# Patient Record
Sex: Female | Born: 1935 | Race: White | Hispanic: No | Marital: Married | Smoking: Never smoker
Health system: Southern US, Community
[De-identification: ages and names within clinical notes are randomized; demographics above are authoritative.]

## PROBLEM LIST (undated history)

## (undated) DIAGNOSIS — R413 Other amnesia: Secondary | ICD-10-CM

## (undated) DIAGNOSIS — E079 Disorder of thyroid, unspecified: Secondary | ICD-10-CM

## (undated) DIAGNOSIS — E785 Hyperlipidemia, unspecified: Secondary | ICD-10-CM

## (undated) DIAGNOSIS — Z9109 Other allergy status, other than to drugs and biological substances: Secondary | ICD-10-CM

## (undated) DIAGNOSIS — M1990 Unspecified osteoarthritis, unspecified site: Secondary | ICD-10-CM

## (undated) DIAGNOSIS — F329 Major depressive disorder, single episode, unspecified: Secondary | ICD-10-CM

## (undated) DIAGNOSIS — F32A Depression, unspecified: Secondary | ICD-10-CM

## (undated) DIAGNOSIS — K635 Polyp of colon: Secondary | ICD-10-CM

## (undated) DIAGNOSIS — I4891 Unspecified atrial fibrillation: Secondary | ICD-10-CM

## (undated) DIAGNOSIS — I442 Atrioventricular block, complete: Secondary | ICD-10-CM

## (undated) DIAGNOSIS — T7840XA Allergy, unspecified, initial encounter: Secondary | ICD-10-CM

## (undated) HISTORY — DX: Polyp of colon: K63.5

## (undated) HISTORY — DX: Allergy, unspecified, initial encounter: T78.40XA

## (undated) HISTORY — DX: Unspecified osteoarthritis, unspecified site: M19.90

## (undated) HISTORY — PX: HAND SURGERY: SHX662

## (undated) HISTORY — DX: Major depressive disorder, single episode, unspecified: F32.9

## (undated) HISTORY — PX: SALPINGOOPHORECTOMY: SHX82

## (undated) HISTORY — DX: Depression, unspecified: F32.A

## (undated) HISTORY — PX: EYE SURGERY: SHX253

## (undated) HISTORY — DX: Other amnesia: R41.3

## (undated) HISTORY — PX: KNEE SURGERY: SHX244

---

## 1963-06-23 HISTORY — PX: APPENDECTOMY: SHX54

## 1963-06-23 HISTORY — PX: OTHER SURGICAL HISTORY: SHX169

## 1983-06-23 HISTORY — PX: OTHER SURGICAL HISTORY: SHX169

## 1994-06-22 HISTORY — PX: CERVICAL POLYPECTOMY: SHX88

## 2000-04-28 ENCOUNTER — Encounter: Payer: Self-pay | Admitting: Obstetrics and Gynecology

## 2000-04-28 ENCOUNTER — Encounter: Admission: RE | Admit: 2000-04-28 | Discharge: 2000-04-28 | Payer: Self-pay | Admitting: Obstetrics and Gynecology

## 2000-05-04 ENCOUNTER — Other Ambulatory Visit: Admission: RE | Admit: 2000-05-04 | Discharge: 2000-05-04 | Payer: Self-pay | Admitting: Obstetrics and Gynecology

## 2000-05-10 ENCOUNTER — Encounter: Payer: Self-pay | Admitting: Obstetrics and Gynecology

## 2000-05-10 ENCOUNTER — Encounter: Admission: RE | Admit: 2000-05-10 | Discharge: 2000-05-10 | Payer: Self-pay | Admitting: Obstetrics and Gynecology

## 2001-05-14 ENCOUNTER — Emergency Department (HOSPITAL_COMMUNITY): Admission: EM | Admit: 2001-05-14 | Discharge: 2001-05-14 | Payer: Self-pay | Admitting: Emergency Medicine

## 2001-05-14 ENCOUNTER — Encounter: Payer: Self-pay | Admitting: Cardiology

## 2002-05-29 ENCOUNTER — Other Ambulatory Visit: Admission: RE | Admit: 2002-05-29 | Discharge: 2002-05-29 | Payer: Self-pay | Admitting: Obstetrics and Gynecology

## 2003-10-08 ENCOUNTER — Other Ambulatory Visit: Admission: RE | Admit: 2003-10-08 | Discharge: 2003-10-08 | Payer: Self-pay | Admitting: Obstetrics and Gynecology

## 2004-02-19 ENCOUNTER — Ambulatory Visit (HOSPITAL_COMMUNITY): Admission: RE | Admit: 2004-02-19 | Discharge: 2004-02-19 | Payer: Self-pay | Admitting: Obstetrics and Gynecology

## 2004-03-12 ENCOUNTER — Ambulatory Visit: Admission: RE | Admit: 2004-03-12 | Discharge: 2004-03-12 | Payer: Self-pay | Admitting: Gynecologic Oncology

## 2004-12-10 ENCOUNTER — Ambulatory Visit (HOSPITAL_COMMUNITY): Admission: RE | Admit: 2004-12-10 | Discharge: 2004-12-10 | Payer: Self-pay | Admitting: Endocrinology

## 2005-06-22 HISTORY — PX: COLONOSCOPY: SHX174

## 2005-09-08 ENCOUNTER — Ambulatory Visit (HOSPITAL_COMMUNITY): Admission: RE | Admit: 2005-09-08 | Discharge: 2005-09-08 | Payer: Self-pay | Admitting: Obstetrics and Gynecology

## 2005-10-20 ENCOUNTER — Encounter: Admission: RE | Admit: 2005-10-20 | Discharge: 2005-10-20 | Payer: Self-pay | Admitting: Cardiology

## 2006-09-13 ENCOUNTER — Ambulatory Visit (HOSPITAL_COMMUNITY): Admission: RE | Admit: 2006-09-13 | Discharge: 2006-09-13 | Payer: Self-pay | Admitting: Obstetrics and Gynecology

## 2006-11-25 ENCOUNTER — Ambulatory Visit (HOSPITAL_COMMUNITY): Admission: RE | Admit: 2006-11-25 | Discharge: 2006-11-25 | Payer: Self-pay | Admitting: Obstetrics & Gynecology

## 2006-11-25 ENCOUNTER — Encounter (INDEPENDENT_AMBULATORY_CARE_PROVIDER_SITE_OTHER): Payer: Self-pay | Admitting: Obstetrics & Gynecology

## 2009-07-17 ENCOUNTER — Emergency Department (HOSPITAL_COMMUNITY): Admission: EM | Admit: 2009-07-17 | Discharge: 2009-07-18 | Payer: Self-pay | Admitting: Emergency Medicine

## 2009-09-06 ENCOUNTER — Encounter: Admission: RE | Admit: 2009-09-06 | Discharge: 2009-09-06 | Payer: Self-pay | Admitting: Neurology

## 2009-12-26 ENCOUNTER — Ambulatory Visit: Payer: Self-pay | Admitting: Internal Medicine

## 2010-01-03 ENCOUNTER — Ambulatory Visit (HOSPITAL_COMMUNITY): Admission: RE | Admit: 2010-01-03 | Discharge: 2010-01-03 | Payer: Self-pay | Admitting: Internal Medicine

## 2010-07-13 ENCOUNTER — Encounter: Payer: Self-pay | Admitting: Internal Medicine

## 2010-09-08 LAB — COMPREHENSIVE METABOLIC PANEL
ALT: 18 U/L (ref 0–35)
AST: 27 U/L (ref 0–37)
Albumin: 3.6 g/dL (ref 3.5–5.2)
Creatinine, Ser: 0.77 mg/dL (ref 0.4–1.2)
GFR calc non Af Amer: >60 mL/min (ref >60)
Glucose, Bld: 127 mg/dL — ABNORMAL HIGH (ref 70–99)
Potassium: 4.4 mEq/L (ref 3.5–5.1)
Sodium: 135 mEq/L (ref 135–145)

## 2010-09-08 LAB — CBC
Platelets: 203 10*3/uL (ref 150–400)
RBC: 4.01 MIL/uL (ref 3.87–5.11)
RDW: 12.8 % (ref 11.5–15.5)
WBC: 6.1 10*3/uL (ref 4.0–10.5)

## 2010-09-08 LAB — URINE MICROSCOPIC-ADD ON

## 2010-09-08 LAB — URINE CULTURE

## 2010-09-08 LAB — URINALYSIS, ROUTINE W REFLEX MICROSCOPIC
Glucose, UA: NEGATIVE mg/dL
Ketones, ur: NEGATIVE mg/dL
Protein, ur: NEGATIVE mg/dL
pH: 5.5 (ref 5.0–8.0)

## 2010-09-08 LAB — POCT CARDIAC MARKERS: Troponin i, poc: <0.05 ng/mL (ref 0.00–0.09)

## 2010-09-08 LAB — DIFFERENTIAL
Basophils Absolute: 0 10*3/uL (ref 0.0–0.1)
Eosinophils Absolute: 0.1 10*3/uL (ref 0.0–0.7)
Eosinophils Relative: 2 % (ref 0–5)
Lymphs Abs: 1.9 10*3/uL (ref 0.7–4.0)
Monocytes Relative: 11 % (ref 3–12)
Neutro Abs: 3.4 10*3/uL (ref 1.7–7.7)

## 2010-09-08 LAB — PROTIME-INR: Prothrombin Time: 12.6 seconds (ref 11.6–15.2)

## 2010-11-07 NOTE — Consult Note (Signed)
[name], [name]                   ACCOUNT NO.:  [id number]   MEDICAL RECORD NO.:  [id number]          PATIENT TYPE:  OUT   LOCATION:  GYN                          FACILITY:  [facility]   PHYSICIAN:  John T. Kyla Balzarine, M.D.    DATE OF BIRTH:  12-16-1934   DATE OF CONSULTATION:  DATE OF DISCHARGE:                                   CONSULTATION   CHIEF COMPLAINT:  This 76 year old woman is seen at the request of Dr.  Ambrose Mantle regarding management of a simple ovarian cyst.   HISTORY OF PRESENT ILLNESS:  Patient relates occasional right lower quadrant  pain.  She had an ultrasound in April, 2005, which apparently revealed a  simple cyst of the ovary.  CA-125 value was 10 u/ml.  Her right lower  quadrant pain has essentially resolved.  She has a remote history of  endometriosis at age 55, or perhaps a dermoid cyst, as she states teeth were  in the cyst, which was removed from her ovary.  She underwent menopause in  her late 74s and was on estrogen and progesterone for nine years, quitting  10 years ago.  Patient had a follow-up ultrasound scheduled by another  physician on August 30th, which revealed a 3.6 cm simple cyst of the right  ovary.  The uterus and left ovary was normal with a thin endometrial stripe  and no pelvic fluid.   PAST MEDICAL HISTORY:  Significant for postmenopausal mild hirsutism,  childhood asthma, and respiratory allergies.   PAST SURGICAL HISTORY:  Includes ovarian cystectomy for likely dermoid,  although the patient repeatedly states that this was endometriosis.  She is  status post NSVD x1.   MEDICATIONS:  Multivitamins.  Ativan p.r.n.   ALLERGIES:  None known.   FAMILY HISTORY:  Negative for ovarian cancer.   SOCIAL HISTORY:  Denies tobacco use.  Married.  Admits occasional wine.   REVIEW OF SYSTEMS:  GENERAL/SYSTEMIC:  No fever, chills, anorexia, fatigue,  or weight loss.  CARDIOPULMONARY:  Childhood asthma.  No wheezing episodes  or asthmatic treatment since  teens.  RESPIRATORY:  Allergies.  GI:  The  patient has a long history of spastic colon with chronic constipation,  treated with a variety of medications in the past.  She currently regulates  her bowel function with diet.  GU:  No GU symptoms.  OB/GYN:  As above:  NEURO/PSYCH:  Patient has some type of ill-defined anxiety disorder, having  used Prozac in the past and currently on Ativan for a sleep disorder.  She  denies suicidal ideations.   PHYSICAL EXAMINATION:  VITAL SIGNS:  Weight 180 pounds.  Blood pressure  122/78, pulse 72, afebrile.  GENERAL:  Patient is alert and oriented x 3 in no acute distress.  NECK:  There is no pathological adenopathy.  LUNGS:  Lung fields are clear.  BACK:  There is no back or CVA tenderness.  ABDOMEN:  Scaphoid, soft, benign without ascites, mass, tenderness, or  organomegaly.  EXTREMITIES:  Full range of motion without edema.  PELVIC:  External genitalia and BUS are normal  on inspection and palpation.  The bladder and urethra are atrophic and well supported.  The cervix has no  lesions or tenderness to cervical motion.  Bimanual and rectovaginal  examinations reveal normal uterus.  There is no adnexal mass, tenderness, or  nodularity palpable.   ASSESSMENT:  A less than 4 cm simple cyst to the right ovary with no  pathologic features suggesting ovarian cancer and normal CA-125 values (risk  of malignancy less than 1%).   RECOMMENDATIONS:  I had a long discussion with the patient and her husband,  in excess of 30 minutes, and answered multiple questions posed by them  regarding the likelihood of malignancy or this cyst causing a problem.  I  recommended that they should undergo a follow-up ultrasound under the  auspices of Dr. Rosalio Macadamia.  Because the risk of malignancy is so small, I do  not feel there is any pressing need for surgery at this time.  If she has  significant enlargement of the cyst, change in features that would suggest  malignancy  or symptoms (including marked anxiety), surgery could be  considered.  Unless there are features that are at high risk for malignancy,  this patient's surgery could be performed by Dr. Rosalio Macadamia.     John   JTS/MEDQ  D:  03/12/2004  T:  03/13/2004  Job:  [id number]   cc:   Malachi Pro. Ambrose Mantle, M.D.  [address]  [address]  [place] [address]  Fax: [phone]   Cordelia Pen A. Rosalio Macadamia, M.D.  [address]  [place]  [place] [address]  Fax: [phone]   Telford Nab, R.N.  [phone] [address]  [place], [place] [address]

## 2010-11-07 NOTE — Op Note (Signed)
NAMEKatrece, [name]                   ACCOUNT NO.:  [id number]   MEDICAL RECORD NO.:  [id number]          PATIENT TYPE:  AMB   LOCATION:  SDC                           FACILITY:  [facility]   PHYSICIAN:  Genia Del, M.D.DATE OF BIRTH:  24-Sep-1934   DATE OF PROCEDURE:  11/25/2006  DATE OF DISCHARGE:                               OPERATIVE REPORT   PREOPERATIVE DIAGNOSIS:  Right persistent ovarian cyst.   POSTOPERATIVE DIAGNOSIS:  Right persistent ovarian cyst.   PROCEDURE:  Laparoscopy with right salpingo-oophorectomy.   SURGEON:  Dr. Genia Del   ASSISTANT:  Dr. Marina Gravel.   ANESTHESIOLOGIST:  Dr. Tacy Dura   PROCEDURE:  Under general anesthesia with endotracheal intubation the  patient is in lithotomy position.  She is prepped with Betadine on the  abdominal, suprapubic, vulvar and vaginal areas. Foley is put in place  and the patient is draped as usual.  The vaginal exam reveals an  anteverted uterus, normal volume, no mass felt.  The speculum was  introduced in the vagina.  The uterus is cannulated and the speculum is  removed and infiltration of Marcaine one-quarter plain is done at the  infraumbilical area.  We then make an infraumbilical incision over 1.5  cm, opened the aponeurosis with Mayo scissors under direct vision and  opened the parietal peritoneum under direct vision with Mayo scissors.  We put a pursestring stitch of Vicryl 0 at the aponeurosis.  We then  insert the [place] and the camera at that level. Pneumoperitoneum with  CO2 is created.  We make two contralateral incisions at the iliac areas  with the scalpel over 5 mm each. We introduce a 5-mm trocars at each  level under direct vision.  We insert the instruments which are the  gyrus and a clamp.  We inspect the abdominopelvic cavity.  The liver is  normal in appearance.  The appendix is not visualized.  We see a normal  uterus in size and appearance.  The left tube and ovary are normal.  The  right  tube is normal.  The right ovary presents a small thin wall cyst  measuring about 3.5-4 cm.  We visualized the ureter and it is in normal  anatomic position on the right side.  We cauterize and section the right  infundibulopelvic ligament.  We follow just under the ovary all along.  We cauterized and section the proximal right tube and the utero-ovarian  ligament.  We detached the right ovary and tube completely.  We then  switched to a 5 mm scope and we put the bag in the infraumbilical  trocar. We put the right ovary and tube in that bag, removed it and sent  it to pathology.  We then verify hemostasis, it is adequate at all  pedicles. We irrigate and suction the abdominopelvic cavity.  We then  removed all instruments, removed the trocars under direct vision.  We  close the aponeurosis of the infraumbilical incision by attaching the  pursestring stitch.  We then close the skin with subcuticular sutures of  Vicryl 4-0 at all  incisions.  Hemostasis was completed with the  electrocautery at the infraumbilical incision.  We then  irrigated, good hemostasis at all incisions.  The estimated blood loss  was minimal.  No complications occurred. The instrument was removed  vaginally.  The patient received a dose of Flagyl 500 mg IV at  induction.  No complications occurred and the patient was brought to  recovery room in good stable status.      Genia Del, M.D.  Electronically Signed     ML/MEDQ  D:  11/25/2006  T:  11/25/2006  Job:  [id number]

## 2011-01-05 ENCOUNTER — Encounter: Payer: Self-pay | Admitting: Internal Medicine

## 2011-01-08 ENCOUNTER — Other Ambulatory Visit: Payer: Medicare Other | Admitting: Internal Medicine

## 2011-01-08 DIAGNOSIS — R5383 Other fatigue: Secondary | ICD-10-CM

## 2011-01-08 DIAGNOSIS — E559 Vitamin D deficiency, unspecified: Secondary | ICD-10-CM

## 2011-01-08 DIAGNOSIS — E785 Hyperlipidemia, unspecified: Secondary | ICD-10-CM

## 2011-01-08 LAB — CBC WITH DIFFERENTIAL/PLATELET
Basophils Relative: 1 % (ref 0–1)
Eosinophils Absolute: 0.3 10*3/uL (ref 0.0–0.7)
Hemoglobin: 13.8 g/dL (ref 12.0–15.0)
Lymphs Abs: 1.9 10*3/uL (ref 0.7–4.0)
MCH: 30.7 pg (ref 26.0–34.0)
MCHC: 32.8 g/dL (ref 30.0–36.0)
MCV: 93.8 fL (ref 78.0–100.0)
Neutrophils Relative %: 53 % (ref 43–77)
Platelets: 219 10*3/uL (ref 150–400)
RBC: 4.49 MIL/uL (ref 3.87–5.11)

## 2011-01-08 LAB — COMPREHENSIVE METABOLIC PANEL
AST: 23 U/L (ref 0–37)
BUN: 23 mg/dL (ref 6–23)
CO2: 26 mEq/L (ref 19–32)
Chloride: 104 mEq/L (ref 96–112)
Creat: 0.79 mg/dL (ref 0.50–1.10)
Glucose, Bld: 74 mg/dL (ref 70–99)
Potassium: 3.8 mEq/L (ref 3.5–5.3)
Sodium: 137 mEq/L (ref 135–145)
Total Bilirubin: 0.6 mg/dL (ref 0.3–1.2)
Total Protein: 6.8 g/dL (ref 6.0–8.3)

## 2011-01-08 LAB — LIPID PANEL: Total CHOL/HDL Ratio: 3.4 Ratio

## 2011-01-09 ENCOUNTER — Encounter: Payer: Self-pay | Admitting: Internal Medicine

## 2011-01-09 ENCOUNTER — Ambulatory Visit (INDEPENDENT_AMBULATORY_CARE_PROVIDER_SITE_OTHER): Payer: Medicare Other | Admitting: Internal Medicine

## 2011-01-09 VITALS — BP 122/64 | HR 68 | Temp 98.3°F | Ht 67.5 in | Wt 155.0 lb

## 2011-01-09 DIAGNOSIS — E039 Hypothyroidism, unspecified: Secondary | ICD-10-CM

## 2011-01-09 DIAGNOSIS — R269 Unspecified abnormalities of gait and mobility: Secondary | ICD-10-CM

## 2011-01-09 DIAGNOSIS — Z0000 Encounter for general adult medical examination without abnormal findings: Secondary | ICD-10-CM

## 2011-01-09 DIAGNOSIS — Z23 Encounter for immunization: Secondary | ICD-10-CM

## 2011-01-09 DIAGNOSIS — I440 Atrioventricular block, first degree: Secondary | ICD-10-CM

## 2011-01-09 DIAGNOSIS — E785 Hyperlipidemia, unspecified: Secondary | ICD-10-CM

## 2011-01-09 LAB — POCT URINALYSIS DIPSTICK
Bilirubin, UA: NEGATIVE
Glucose, UA: NEGATIVE
Ketones, UA: NEGATIVE
NEG CONTROL: NEGATIVE
Nitrite, UA: NEGATIVE
Spec Grav, UA: 1.015
pH, UA: 6

## 2011-01-09 LAB — VITAMIN D 25 HYDROXY (VIT D DEFICIENCY, FRACTURES): Vit D, 25-Hydroxy: 38 ng/mL (ref 30–89)

## 2011-01-13 DIAGNOSIS — I440 Atrioventricular block, first degree: Secondary | ICD-10-CM | POA: Insufficient documentation

## 2011-01-13 DIAGNOSIS — E039 Hypothyroidism, unspecified: Secondary | ICD-10-CM | POA: Insufficient documentation

## 2011-01-13 DIAGNOSIS — R269 Unspecified abnormalities of gait and mobility: Secondary | ICD-10-CM | POA: Insufficient documentation

## 2011-01-13 DIAGNOSIS — E785 Hyperlipidemia, unspecified: Secondary | ICD-10-CM | POA: Insufficient documentation

## 2011-01-13 NOTE — Patient Instructions (Signed)
At your request we will make ENT appointment referral for you. Please consider taking thyroid replacement therapy. Please consider gait evaluation by physical therapy.

## 2011-01-13 NOTE — Progress Notes (Signed)
Subjective:    Patient ID: Rulon Abide, female    DOB: 10-24-1934, 76 y.o.   MRN: [id number]  HPI  76 year old married white female last seen July 2011. In January 2011 she had an episode of acute weakness and dizziness associated with with losing her balance. She went to the emergency department. CT of the brain showed no acute abnormality. Subsequently she saw neurologist, Dr. Vickey Huger, who did an MRI of the brain in March 2011. There was no evidence of tumor or infarcts identified. White matter hyperintensities were noted likely from chronic microvascular ischemia. Sinuses showed mild chronic inflammatory changes. She had colonoscopy by Dr. Evette Cristal in September 2007. She had 2 tubular adenomas removed. It is likely time to have that study repeated in she should contact a normal GI. Dr. Dagoberto Ligas as is her eye physician and she last saw her July 2011. Patient says she has had the shingles vaccine elsewhere. Had flu vaccine at [facility] November 2011. She also says she's had Pneumovax immunization. History of fibrocystic breast disease and last mammogram was done in July 2011. In 1996 she had a polyp removed from her cervical os. Had right ovarian cystectomy and appendectomy in 1965. Tetanus update given July 2011.  Patient saw Dr. Kandra Nicolas for evaluation March 2011. History given was that she went into the living room from the kitchen and almost fell between the coffee table and [place]. Apparently there was another episode where she fell to the right. The episode in January was associated with some numbness in her left side. Has complained of some memory problems for a couple of years. She is a retired Psychiatrist. Taught second and third right for 30 years. Rare alcohol consumption. Nonsmoker.  Father died from colon cancer at age 23. Mother died from "colon blockage" after surgery at age 71. Brother has had coronary artery disease with stent placement. History of borderline first  degree AV block noted in 1998 on EKG here in this office  At one point patient saw Dr. Jodi Marble for depression and was on Paxil for about a year in 1997 through 1998. Has been to whenever OB/GYN for Pap smears and GYN care. In 2001 she had reconstruction of left thumb CMC joint by Dr. Teressa Senter.  Her husband is a retired Environmental consultant. They have 2 daughters and a son all of them live out of town. One son and one daughter were adopted. There is only one natural daughter.  Patient takes multivitamins and vitamin D supplement.  Patient is asking for referral to ENT physician specifically Dr. Jearld Fenton regarding her balance and her throat. Neurologist had recommended a gait evaluation the patient has yet to do that. Recent lab work shows a TSH of 4. 069 consistent with early hypothyroidism. She is unwilling to take Synthroid. Total cholesterol is 229 with an LDL cholesterol of 144. This shows some improvement from last year which time total cholesterol was 245 and LDL cholesterol was 160. She is unwilling to take lipid-lowering medication. Fasting glucose is normal at 74. Vitamin D level is 38. Patient wants to be on high-dose vitamin D but I recommended she take 2000 units vitamin D 3 daily.       Review of Systems  Constitutional: Negative.   HENT: Positive for congestion and postnasal drip.   Eyes: Negative.   Respiratory: Negative.   Cardiovascular: Negative.   Gastrointestinal: Negative.   Genitourinary: Negative.   Neurological: Positive for dizziness and numbness.       Issues  with balance at times  Hematological: Negative.   Psychiatric/Behavioral:       Decreased memory       Objective:   Physical Exam  Constitutional: She is oriented to person, place, and time. She appears well-nourished. No distress.  HENT:  Head: Normocephalic and atraumatic.  Right Ear: External ear normal.  Left Ear: External ear normal.  Mouth/Throat: Oropharynx is clear and moist. No  oropharyngeal exudate.  Eyes: EOM are normal. Pupils are equal, round, and reactive to light. No scleral icterus.  Neck: Neck supple. No JVD present. No thyromegaly present.  Cardiovascular: Normal rate, regular rhythm and normal heart sounds.   No murmur heard. Pulmonary/Chest: No respiratory distress. She has no wheezes. She has no rales. She exhibits no tenderness.       Breasts normal female  Abdominal: Soft. Bowel sounds are normal. She exhibits no distension and no mass. There is no tenderness. There is no rebound and no guarding.  Genitourinary:       Deferred  Musculoskeletal: She exhibits no edema.  Lymphadenopathy:    She has no cervical adenopathy.  Neurological: She is alert and oriented to person, place, and time. She has normal reflexes. No cranial nerve deficit. Coordination normal.  Skin: Skin is warm and dry. No rash noted.  Psychiatric: She has a normal mood and affect. Her behavior is normal.          Assessment & Plan:  Episode of loss of balance with? Left hemiparesthesias January 2011 and perhaps another episode in 2011. Workup by Dr. Pricilla Larsson was negative for stroke. MRI showed chronic microvascular disease. Patient still complaining of issues with her balance. Wants to see ENT physician for evaluation about her balance and also for some complaint of throat congestion.  I do think she is developing early hypothyroidism but she refuses to take Synthroid.  Hyperlipidemia-patient does not want to be a lipid-lowering medication.  Health maintenance-probably due for repeat colonoscopy with history of adenomatous polyps removed in 2007.  These issues were discussed with her husband today. I do think it's possible she could have some early dementia.  Return one year/when necessary

## 2011-01-19 ENCOUNTER — Telehealth: Payer: Self-pay

## 2011-01-19 NOTE — Telephone Encounter (Signed)
Patient scheduled for appointment with Dr. Collier Salina on Friday August 3 @ 1;00 pm. Has been on vacation, so informed of appointment today.

## 2011-04-03 ENCOUNTER — Encounter: Payer: Self-pay | Admitting: Internal Medicine

## 2011-04-09 LAB — COMPREHENSIVE METABOLIC PANEL
ALT: 18
AST: 25
CO2: 27
Calcium: 9.1
Chloride: 105
GFR calc Af Amer: >60
GFR calc non Af Amer: >60
Sodium: 139
Total Bilirubin: 0.9

## 2011-04-09 LAB — CBC
RBC: 4.55
WBC: 5.3

## 2011-08-07 ENCOUNTER — Emergency Department (HOSPITAL_COMMUNITY)
Admission: EM | Admit: 2011-08-07 | Discharge: 2011-08-07 | Disposition: A | Discharge status: Home / Self Care | Payer: Medicare Other | Attending: Emergency Medicine | Admitting: Emergency Medicine

## 2011-08-07 ENCOUNTER — Encounter (HOSPITAL_COMMUNITY): Payer: Self-pay | Admitting: Emergency Medicine

## 2011-08-07 ENCOUNTER — Encounter (HOSPITAL_COMMUNITY): Payer: Self-pay

## 2011-08-07 ENCOUNTER — Other Ambulatory Visit: Payer: Self-pay

## 2011-08-07 ENCOUNTER — Emergency Department (INDEPENDENT_AMBULATORY_CARE_PROVIDER_SITE_OTHER)
Admission: EM | Admit: 2011-08-07 | Discharge: 2011-08-07 | Disposition: A | Discharge status: Other Acute Inpatient Hospital | Payer: Medicare Other | Source: Home / Self Care | Attending: Family Medicine | Admitting: Family Medicine

## 2011-08-07 DIAGNOSIS — Z7982 Long term (current) use of aspirin: Secondary | ICD-10-CM | POA: Insufficient documentation

## 2011-08-07 DIAGNOSIS — R110 Nausea: Secondary | ICD-10-CM | POA: Insufficient documentation

## 2011-08-07 DIAGNOSIS — Z79899 Other long term (current) drug therapy: Secondary | ICD-10-CM | POA: Insufficient documentation

## 2011-08-07 DIAGNOSIS — E785 Hyperlipidemia, unspecified: Secondary | ICD-10-CM | POA: Insufficient documentation

## 2011-08-07 DIAGNOSIS — I441 Atrioventricular block, second degree: Secondary | ICD-10-CM

## 2011-08-07 HISTORY — DX: Disorder of thyroid, unspecified: E07.9

## 2011-08-07 HISTORY — DX: Hyperlipidemia, unspecified: E78.5

## 2011-08-07 LAB — CBC
MCHC: 34.9 g/dL (ref 30.0–36.0)
MCV: 90.6 fL (ref 78.0–100.0)
Platelets: 176 10*3/uL (ref 150–400)
RDW: 12.5 % (ref 11.5–15.5)
WBC: 7.1 10*3/uL (ref 4.0–10.5)

## 2011-08-07 LAB — DIFFERENTIAL
Basophils Absolute: 0 10*3/uL (ref 0.0–0.1)
Basophils Relative: 0 % (ref 0–1)
Eosinophils Absolute: 0.1 10*3/uL (ref 0.0–0.7)
Eosinophils Relative: 1 % (ref 0–5)
Lymphocytes Relative: 18 % (ref 12–46)

## 2011-08-07 LAB — POCT URINALYSIS DIP (DEVICE)
Protein, ur: NEGATIVE mg/dL
Specific Gravity, Urine: 1.025 (ref 1.005–1.030)
Urobilinogen, UA: 0.2 mg/dL (ref 0.0–1.0)

## 2011-08-07 LAB — COMPREHENSIVE METABOLIC PANEL
ALT: 15 U/L (ref 0–35)
AST: 22 U/L (ref 0–37)
Albumin: 4 g/dL (ref 3.5–5.2)
CO2: 27 mEq/L (ref 19–32)
Calcium: 9.4 mg/dL (ref 8.4–10.5)
Sodium: 138 mEq/L (ref 135–145)
Total Protein: 6.8 g/dL (ref 6.0–8.3)

## 2011-08-07 LAB — POCT I-STAT TROPONIN I

## 2011-08-07 MED ORDER — ONDANSETRON HCL 4 MG PO TABS: 4.0000 mg | ORAL_TABLET | Freq: Four times a day (QID) | ORAL | Status: AC

## 2011-08-07 MED ORDER — SODIUM CHLORIDE 0.9 % IV SOLN
INTRAVENOUS | Status: DC
  Administered 2011-08-07: 17:00:00 via INTRAVENOUS

## 2011-08-07 NOTE — ED Notes (Signed)
Per ems- pt coming from urgent care where she initially went to be seen for nausea.  At urgent care pt in second degree block type II.

## 2011-08-07 NOTE — ED Notes (Signed)
EDP to bedside. 

## 2011-08-07 NOTE — ED Provider Notes (Signed)
History     CSN: [id number]  Arrival date & time 08/07/11  1439   None     Chief Complaint  Patient presents with  . Nausea    (Consider location/radiation/quality/duration/timing/severity/associated sxs/prior treatment) HPI Comments: Patient presents this afternoon with her husband. She complains today of episodic nausea yesterday and today. "Not like I'm going to throw up." The episodes last 2-3 minutes then resolve. She denies vomiting, and BMs are normal soft stool, unchanged. No aggrevating or alleviating factors. She had similar episodes of nausea in December of 2012. She also denies fever, chest discomfort, cough, dyspnea, or dysuria. She is urinating frequently. Upon review of her records she has a hx of hypothyroidism and hyperlipidemia which she declines treatment for, 1st degree AV block, and possible early dementia. She has not tried anything for her symptoms.    Past Medical History  Diagnosis Date  . Hyperlipidemia   . Thyroid disease   . AV node arrhythmia     Past Surgical History  Procedure Date  . Knee surgery   . Ovary surgery     History reviewed. No pertinent family history.  History  Substance Use Topics  . Smoking status: Never Smoker   . Smokeless tobacco: Never Used  . Alcohol Use: No     rare    OB History    Grav Para Term Preterm Abortions TAB SAB Ect Mult Living                  Review of Systems  Constitutional: Positive for chills (unchanged). Negative for fever, appetite change and unexpected weight change.  HENT: Negative for ear pain, congestion and sore throat.   Respiratory: Negative for cough and shortness of breath.   Cardiovascular: Negative for chest pain and palpitations.  Gastrointestinal: Positive for nausea. Negative for vomiting, abdominal pain, diarrhea and constipation.  Genitourinary: Positive for frequency. Negative for dysuria and urgency.  Musculoskeletal: Negative for back pain.    Allergies  Sulfa  antibiotics  Home Medications   Current Outpatient Rx  Name Route Sig Dispense Refill  . ASPIRIN 81 MG PO CHEW Oral Chew 81 mg by mouth daily.      . ACIDOPHOLUS PO Oral Take by mouth.      . MULTI-VITAMIN/MINERALS PO TABS Oral Take 1 tablet by mouth daily.      Marland Kitchen COENZYME Q10 30 MG PO CAPS Oral Take 30 mg by mouth 3 (three) times daily.      . OMEGA-3 FATTY ACIDS 1000 MG PO CAPS Oral Take 2 g by mouth daily.        BP 126/70  Pulse 79  Temp(Src) 98.4 F (36.9 C) (Oral)  Resp 18  SpO2 96%  Physical Exam  Nursing note and vitals reviewed. Constitutional: She appears well-developed and well-nourished. No distress.  HENT:  Head: Normocephalic and atraumatic.  Right Ear: Tympanic membrane, external ear and ear canal normal.  Left Ear: Tympanic membrane, external ear and ear canal normal.  Nose: Nose normal.  Mouth/Throat: Uvula is midline, oropharynx is clear and moist and mucous membranes are normal. No oropharyngeal exudate, posterior oropharyngeal edema or posterior oropharyngeal erythema.  Neck: Neck supple.  Cardiovascular: Normal rate, regular rhythm and normal heart sounds.   Pulmonary/Chest: Effort normal and breath sounds normal. No respiratory distress.  Abdominal: Soft. Bowel sounds are normal. She exhibits no distension and no mass. There is no tenderness.  Lymphadenopathy:    She has no cervical adenopathy.  Neurological: She is alert.  Skin: Skin is warm and dry.  Psychiatric: She has a normal mood and affect.    ED Course  Procedures (including critical care time)  Labs Reviewed  POCT URINALYSIS DIP (DEVICE) - Abnormal; Notable for the following:    Ketones, ur TRACE (*)    Hgb urine dipstick MODERATE (*)    All other components within normal limits   No results found.   1. Mobitz type 1 second degree atrioventricular block   2. Nausea alone       MDM  EKG 2nd degree AV block Mobitz I, Lt anterior fascicular block, possible anterior infarct, age  undetermined. Rate 66. Pt transferred via [facility] to ED.         Melody Comas, [place] 08/07/11 [phone]

## 2011-08-07 NOTE — ED Notes (Signed)
EKG has been done. Given to EDP

## 2011-08-07 NOTE — ED Notes (Signed)
C/o nausea.  States 2 episodes yesterday and 2 today.  States episodes last only a few minutes.  Denies pain, vomiting or diarrhea.  Reports she had 2 episodes in Dec 2012 that were much worse in severity- says she may have some nausea in between furrent ones in Dec. 2012.  She has not seen Dr Lenord Fellers for this.

## 2011-08-07 NOTE — ED Provider Notes (Signed)
History     CSN: [id number]  Arrival date & time 08/07/11  1647   First MD Initiated Contact with Patient 08/07/11 1700      Chief Complaint  Patient presents with  . Nausea    (Consider location/radiation/quality/duration/timing/severity/associated sxs/prior treatment) The history is provided by the patient, the spouse and medical records.   the patient is a 77 year old, female, with a history of thyroid disease, and arthritis, who presents to the emergency department complaining of intermittent nausea.  Her symptoms.  Last 2-3 minutes at a time.  She does not have vomiting, or diarrhea.  She denies pain, associated with this.  There is no association with food.  She had an episode while in the waiting room and is resolved now.  She was seen at the urgent care center, where they thought they saw a second degree heart block on her EKG so they sent her here for further evaluation.  She denies a history of coronary artery disease, or peptic ulcer disease.  She is not take nonsteroidal medications.  She has had bilateral oophorectomies, but no other abdominal surgery.  She is asymptomatic now  Past Medical History  Diagnosis Date  . Hyperlipidemia   . Thyroid disease   . AV node arrhythmia   . Hyperlipidemia     Past Surgical History  Procedure Date  . Knee surgery   . Ovary surgery     No family history on file.  History  Substance Use Topics  . Smoking status: Never Smoker   . Smokeless tobacco: Never Used  . Alcohol Use: No     rare    OB History    Grav Para Term Preterm Abortions TAB SAB Ect Mult Living                  Review of Systems  Constitutional: Negative for fever and chills.  HENT: Negative for congestion.   Respiratory: Negative for cough, chest tightness and shortness of breath.   Cardiovascular: Negative for chest pain.  Gastrointestinal: Positive for nausea. Negative for vomiting, abdominal pain and diarrhea.  Genitourinary: Negative for dysuria.    Neurological: Negative for weakness and headaches.  All other systems reviewed and are negative.    Allergies  Sulfa antibiotics  Home Medications   Current Outpatient Rx  Name Route Sig Dispense Refill  . ASPIRIN 81 MG PO CHEW Oral Chew 81 mg by mouth daily.      . OMEGA-3 FATTY ACIDS 1000 MG PO CAPS Oral Take 2 g by mouth daily.      . ACIDOPHOLUS PO Oral Take by mouth.      . MULTI-VITAMIN/MINERALS PO TABS Oral Take 1 tablet by mouth daily.        BP 118/67  Pulse 71  Temp 98.2 F (36.8 C)  Resp 16  SpO2 100%  Physical Exam  Vitals reviewed. Constitutional: She is oriented to person, place, and time. She appears well-developed and well-nourished.  HENT:  Head: Normocephalic and atraumatic.  Eyes: Pupils are equal, round, and reactive to light.  Neck: Normal range of motion.  Cardiovascular: Normal rate, regular rhythm and normal heart sounds.   No murmur heard. Pulmonary/Chest: Effort normal and breath sounds normal. No respiratory distress. She has no wheezes. She has no rales.  Abdominal: Soft. She exhibits no distension and no mass. There is no tenderness. There is no rebound and no guarding.  Musculoskeletal: Normal range of motion. She exhibits no edema and no tenderness.  Neurological:  She is alert and oriented to person, place, and time. No cranial nerve deficit.  Skin: Skin is warm and dry. No rash noted. No erythema.  Psychiatric: She has a normal mood and affect. Her behavior is normal.    ED Course  Procedures (including critical care time) 77 year old, female, presents emergency department with intermittent nausea, and no other symptoms.  She has no risk factors for coronary disease.  She has a benign abdomen.  She is not nauseated at this time.  We will perform laboratory studies, and monitor.  Her and treat her as as needed.  If her symptoms recur.   Labs Reviewed  CBC  DIFFERENTIAL  COMPREHENSIVE METABOLIC PANEL   No results found.   No  diagnosis found.  ED ECG REPORT   Date: 08/07/2011  EKG Time: 5:31 PM  Rate: 60  Rhythm: normal sinus rhythm,    Axis: left  Intervals:first-degree A-V block   ST&T Change: nl  Narrative Interpretation: nsr with first degree av block       she remains asx.  I explained the findings and the plan.  She and her husband agree      MDM  Nausea No evidence of cardiac ischemia or hepatobiliary disease.  No toxicity dehydration or distress.  No acute abdomen.        Nicholes Stairs, MD 08/07/11 1930

## 2011-08-07 NOTE — ED Notes (Signed)
Cardiac monitor show SR with second degree block type I. No ecotpy.  O2 at 2 liters per nasal cannula.

## 2011-08-18 NOTE — ED Provider Notes (Signed)
Medical screening examination/treatment/procedure(s) were performed by non-physician practitioner and as supervising physician I was immediately available for consultation/collaboration.   Barkley Bruns MD.    Barkley Bruns, MD 08/18/11 [phone]

## 2011-08-31 ENCOUNTER — Encounter: Payer: Self-pay | Admitting: Internal Medicine

## 2011-09-03 ENCOUNTER — Other Ambulatory Visit (HOSPITAL_COMMUNITY): Payer: Self-pay | Admitting: *Deleted

## 2011-09-03 ENCOUNTER — Encounter: Payer: Self-pay | Admitting: Internal Medicine

## 2011-09-03 ENCOUNTER — Ambulatory Visit (INDEPENDENT_AMBULATORY_CARE_PROVIDER_SITE_OTHER): Payer: Medicare Other | Admitting: Internal Medicine

## 2011-09-03 VITALS — BP 124/80 | Ht 67.5 in | Wt 149.8 lb

## 2011-09-03 DIAGNOSIS — R1312 Dysphagia, oropharyngeal phase: Secondary | ICD-10-CM

## 2011-09-03 DIAGNOSIS — R1030 Lower abdominal pain, unspecified: Secondary | ICD-10-CM

## 2011-09-03 DIAGNOSIS — Z86010 Personal history of colonic polyps: Secondary | ICD-10-CM

## 2011-09-03 DIAGNOSIS — R109 Unspecified abdominal pain: Secondary | ICD-10-CM

## 2011-09-03 NOTE — Progress Notes (Addendum)
Patient ID: [name], female   DOB: [birth year], 77 y.o.   MRN: [id number]  ASSESSMENT AND PLAN:  1. Lower abdominal pain   Hard to characterize this as to the cause. 4 episodes of lower abdominal crampy pain. On physical exam the aortic impulse may be somewhat widened. She probably does not have an aneurysm but given these new symptoms in the intensity of the pain of at least 2 occasions, CT abdomen and pelvis will be ordered.   2. Oropharyngeal dysphagia   It sounds like she may have some chronic recurrent aspiration or near aspiration of liquids. Modified barium swallow is ordered to evaluate.   3. Personal history of colonic polyps   2 adenomas by report in the past, 2007. Records requested. It is probably going to be appropriate for her to have a surveillance and screening colonoscopy. She is somewhat concerned because she had some transient abdominal pain as she was being sedated prior to the colonoscopy in 2007 no no other untoward effects. I tried to address this with her but I really don't know what happened with that. We'll see what the record review tells [place].    Records review shows colonoscopy well tolerated with diminutive adenomas removed from hepatic flexure and sigmoid colon.     Chief Complaint  Patient presents with  . Abdominal Pain    gas, nausea, cramping is very inrense   PCP Margaree Mackintosh, MD  HISTORY OF PRESENT ILLNESS: [name] is a 77 y.o. (DOB: 1934-08-16)  white female whose primary care physician is Margaree Mackintosh, MD, and comes to me today for problems with abdominal pain. She describes 4 episodes of sudden brief abdominal cramping with associated nausea. She said she did not feel like she was going to vomit but she was severely disabled for 2 or 3 minutes with 2 of these episodes, once in December while in [place], and then again in February at a narrow bread. There is no clear trigger, relationship to eating or defecation. She does not recall having these  problems in the past. There are bilateral lower abdominal cramps. She's had 2 other spell since February where she felt this though not as intense. About one month ago she was in the emergency department with complaints and was discovered to have a Mobitz 1 type block on EKG but normal CBC comprehensive metabolic panel and ruled out for MI.  She has some chronic constipation-like problems and IBS with alternating bowel habits that are small or ball-like at times. She said this is really chronic but perhaps worse. She is not reporting any bleeding. She says she has depressed on her lower abdominal wall to defecate, and his ahead and do that for a number of years. She does tend to move her bowels every day.  She is also describing intermittent problems for a number of years where she she drinks she will choking cannot catch her breath until she drinks another glass of water. There does not appear to be any solid food dysphagia. Choking and questionable aspiration may be occurring more frequently over time.  GI history also pertinent for previous colonoscopy in 2007 with 2 adenomas removed. I am waiting on those reports, Dr. Evette Cristal perform that procedure.      Current Outpatient Prescriptions  Medication Sig Dispense Refill  . aspirin 81 MG chewable tablet Chew 81 mg by mouth daily.        . Cholecalciferol (VITAMIN D-3 PO) Take 1 capsule by mouth daily.      Marland Kitchen  co-enzyme Q-10 30 MG capsule Take 30 mg by mouth 3 (three) times daily.      . Lactobacillus (ACIDOPHOLUS PO) Take 1 tablet by mouth daily.       . Multiple Vitamins-Minerals (MULTIVITAMIN WITH MINERALS) tablet Take 1 tablet by mouth daily.            Allergies  Allergen Reactions  . Sulfa Antibiotics Diarrhea and Rash      Past Medical History  Diagnosis Date  . Hyperlipidemia   . Thyroid disease   . AV node arrhythmia   . Arthritis   . Asthma   . Depression   . Colon polyps       Past Surgical History  Procedure Date  .  Knee surgery   . Salpingoophorectomy     right  . Appendectomy   . Colonoscopy 2007    History   Social History  . Marital Status: Married    Spouse Name: N/A    Number of Children: 3  .     Occupational History  . elem. school teacher     retired   Social History Main Topics  . Smoking status: Never Smoker   . Smokeless tobacco: Never Used  . Alcohol Use: No     rare  . Drug Use: No    Family History  Problem Relation Age of Onset  . Colon cancer Father   . Colon polyps Brother   . Diabetes Brother     maternal aunt      REVIEW OF SYSTEMS: Positive for allergies anxiety cough as above, some depressive symptomatology, insomnia urinary frequency at times high-frequency hearing loss. All other systems reviewed and are negative or as mentioned in the history of present illness.   PHYSICAL EXAM: General:  Well-developed, well-nourished and in no acute distress Eyes:  anicteric. ENT:   Mouth and posterior pharynx free of lesions.  Neck:   supple w/o thyromegaly or mass.  Lungs: Clear to auscultation bilaterally. Heart:  S1S2, no rubs, murmurs, gallops. Abdomen:  soft, non-tender, no hepatosplenomegaly, hernia, or mass and BS+. There is a prominent upper abdominal aortic impulse that may be widened. Number repeat Rectal: Deferred Lymph:  no cervical or supraclavicular adenopathy. Extremities:   no edema femoral pulses intact Skin   no rash. Neuro:  A&O x 3.  Psych:  appropriate mood and  Affect.     DATA REVIEWED: I have requested colonoscopy and pathology report though I have noted the previous colonoscopy history from her primary care note with Dr. Lenord Fellers. However viewed recent ER note and labs. Reviewed the imaging list in the EMR  See assessment and plan for colonoscopy information.   Lab Results  Component Value Date   WBC 7.1 08/07/2011   HGB 13.5 08/07/2011   HCT 38.7 08/07/2011   MCV 90.6 08/07/2011   PLT 176 08/07/2011     Chemistry        Component Value Date/Time   NA 138 08/07/2011 1726   K 4.1 08/07/2011 1726   CL 104 08/07/2011 1726   CO2 27 08/07/2011 1726   BUN 24* 08/07/2011 1726   CREATININE 0.76 08/07/2011 1726   CREATININE 0.79 01/08/2011 0909      Component Value Date/Time   CALCIUM 9.4 08/07/2011 1726   ALKPHOS 61 08/07/2011 1726   AST 22 08/07/2011 1726   ALT 15 08/07/2011 1726   BILITOT 0.4 08/07/2011 1726

## 2011-09-03 NOTE — Patient Instructions (Signed)
  You have been scheduled for a CT scan of the abdomen and pelvis at [facility] ([address]---this is in the same building as Architectural technologist).   You are scheduled on 09/04/11 at 9:00am. You should arrive 15 minutes prior to your appointment time for registration. Please follow the written instructions below on the day of your exam:  WARNING: IF YOU ARE ALLERGIC TO IODINE/X-RAY DYE, PLEASE NOTIFY RADIOLOGY IMMEDIATELY AT [phone]! YOU WILL BE GIVEN A 13 HOUR PREMEDICATION PREP.  1) Do not eat or drink anything after 5:00am (4 hours prior to your test) 2) You have been given 2 bottles of oral contrast to drink. The solution may taste               better if refrigerated, but do NOT add ice or any other liquid to this solution. Shake             well before drinking.    Drink 1 bottle of contrast @ 7:00am (2 hours prior to your exam)  Drink 1 bottle of contrast @ 8:00am (1 hour prior to your exam)  You may take any medications as prescribed with a small amount of water except for the following: Metformin, Glucophage, Glucovance, Avandamet, Riomet, Fortamet, Actoplus Met, Janumet, Glumetza or Metaglip. The above medications must be held the day of the exam AND 48 hours after the exam.  The purpose of you drinking the oral contrast is to aid in the visualization of your intestinal tract. The contrast solution may cause some diarrhea. Before your exam is started, you will be given a small amount of fluid to drink. Depending on your individual set of symptoms, you may also receive an intravenous injection of x-ray contrast/dye. Plan on being at [facility] for 30 minutes or long, depending on the type of exam you are having performed.  If you have any questions regarding your exam or if you need to reschedule, you may call the CT department at [phone] between the hours of 8:00 am and 5:00 pm,  Monday-Friday.  ________________________________________________________________________  Bonita Quin have been scheduled for a modified barium swallow on 09/09/11 at 10:00am. Please arrive 15 minutes prior to your test for registration. You will go to [facility]iology (1st Floor) for your appointment. Please refrain from eating or drinking anything 4 hours prior to your test. Should you need to cancel or reschedule your appointment, please contact [phone] [facility]) or [phone] [facility]).

## 2011-09-04 ENCOUNTER — Ambulatory Visit (INDEPENDENT_AMBULATORY_CARE_PROVIDER_SITE_OTHER)
Admission: RE | Admit: 2011-09-04 | Discharge: 2011-09-04 | Disposition: A | Discharge status: Home / Self Care | Payer: Medicare Other | Source: Ambulatory Visit | Attending: Internal Medicine | Admitting: Internal Medicine

## 2011-09-04 DIAGNOSIS — R1030 Lower abdominal pain, unspecified: Secondary | ICD-10-CM

## 2011-09-04 DIAGNOSIS — R109 Unspecified abdominal pain: Secondary | ICD-10-CM

## 2011-09-04 MED ORDER — IOHEXOL 300 MG/ML  SOLN
100.0000 mL | Freq: Once | INTRAMUSCULAR | Status: AC | PRN
  Administered 2011-09-04: 100 mL via INTRAVENOUS

## 2011-09-06 NOTE — Progress Notes (Signed)
Quick Note:  Let her know that this is ok except for the pickup of lumbar spine arthritis - sometimes can be related to abdominal pain  I have also seen colonoscopy report and recommend that she schedule a colonoscopy - she had transient abdominal pain with that colonoscopy though no major problems - we can schedule her for a propofol colonoscopy since she is concerned about that pain - tell her we will use a different sedation  Indication is screening and hx of polyps ______

## 2011-09-08 ENCOUNTER — Telehealth: Payer: Self-pay | Admitting: Internal Medicine

## 2011-09-08 ENCOUNTER — Encounter: Payer: Self-pay | Admitting: Internal Medicine

## 2011-09-08 NOTE — Telephone Encounter (Signed)
See results on CT scan from 09/07/11

## 2011-09-09 ENCOUNTER — Ambulatory Visit (HOSPITAL_COMMUNITY)
Admission: RE | Admit: 2011-09-09 | Discharge: 2011-09-09 | Disposition: A | Discharge status: Home / Self Care | Payer: Medicare Other | Source: Ambulatory Visit | Attending: Internal Medicine | Admitting: Internal Medicine

## 2011-09-09 DIAGNOSIS — E785 Hyperlipidemia, unspecified: Secondary | ICD-10-CM | POA: Insufficient documentation

## 2011-09-09 DIAGNOSIS — R1312 Dysphagia, oropharyngeal phase: Secondary | ICD-10-CM

## 2011-09-09 DIAGNOSIS — R6889 Other general symptoms and signs: Secondary | ICD-10-CM | POA: Insufficient documentation

## 2011-09-09 DIAGNOSIS — R1319 Other dysphagia: Secondary | ICD-10-CM | POA: Insufficient documentation

## 2011-09-09 NOTE — Procedures (Signed)
No note

## 2011-09-09 NOTE — Procedures (Signed)
Modified Barium Swallow Procedure Note Patient Details  Name: [name] MRN: [id number] Date of Birth: 12/31/1934  Today's Date: 09/09/2011 Time:  -     Past Medical History:  Past Medical History  Diagnosis Date  . Hyperlipidemia   . Thyroid disease   . AV node arrhythmia   . Arthritis   . Asthma   . Depression   . Colon polyps    Past Surgical History:  Past Surgical History  Procedure Date  . Knee surgery   . Salpingoophorectomy     right  . Appendectomy   . Colonoscopy 2007   HPI:  She is also describing intermittent problems for a number of years where she she drinks she will choking cannot catch her breath until she drinks another glass of water. There does not appear to be any solid food dysphagia. Choking and questionable aspiration may be occurring more frequently over time.     Recommendation/Prognosis  Clinical Impression Dysphagia Diagnosis: Within Functional Limits;Suspected primary esophageal dysphagia Clinical impression: Oropharyngeal swallow function was normal, with no delay in initiation, adequate laryngeal elevation obtained, with no laryngeal residue noted after the swallow.  The 12.19mm tablet did clear the esophagus, however, there was liquid stasis in the lovwer distal esophagus which arreared to flow back up the esophagus.  Question narrowing verses LES dysfunction.  There was no radiologist present during this study to confirm.  Patient's reported cough may be secondary to aspiration of reflux, or a reflux cough.  Patient reports she is unable to breath during these coughing episodes, and clears with large consecutive swallows of water. Swallow Evaluation Recommendations Recommended Consults: Consider GI evaluation (Question EGD vs. Esophagram) Solid Consistency: Dysphagia 3 (Mechanical soft) Liquid Consistency: Thin Liquid Administration via: Cup Medication Administration: Whole meds with liquid Supervision: Patient able to self  feed Compensations: Slow rate;Small sips/bites;Follow solids with liquid Postural Changes and/or Swallow Maneuvers: Seated upright 90 degrees;Upright 30-60 min after meal Oral Care Recommendations: Oral care BID Other Recommendations: Clarify dietary restrictions (Reflux precautions) Follow up Recommendations: None   Individuals Consulted Consulted and Agree with Results and Recommendations: Patient Report Sent to : Referring physician  SLP Assessment/Plan    SLP Goals  SLP Swallowing Goals Patient will consume recommended diet without observed clinical signs of aspiration with: Independent assistance Patient will utilize recommended strategies during swallow to increase swallowing safety with: Independent assistance  General:  HPI: She is also describing intermittent problems for a number of years where she she drinks she will choking cannot catch her breath until she drinks another glass of water. There does not appear to be any solid food dysphagia. Choking and questionable aspiration may be occurring more frequently over time. Type of Study: Initial MBS Diet Prior to this Study: Regular;Thin liquids Temperature Spikes Noted: No Respiratory Status: Room air History of Intubation: No Behavior/Cognition: Alert;Cooperative;Pleasant mood Oral Cavity - Dentition: Adequate natural dentition Oral Motor / Sensory Function: Within functional limits Patient Positioning: Upright in chair Baseline Vocal Quality: Clear Volitional Cough: Strong Volitional Swallow: Able to elicit Anatomy:  (Osteophytes noted, but did not appear to impact function.) Pharyngeal Secretions: Not observed secondary MBS  Reason for Referral:  Dysphagia   Oral Phase Oral Preparation/Oral Phase Oral Phase: WFL Pharyngeal Phase  Pharyngeal Phase Pharyngeal Phase: Within functional limits Cervical Esophageal Phase  Cervical Esophageal Phase Cervical Esophageal Phase: Vicente Masson T 09/09/2011,  1:55 PM

## 2011-09-14 ENCOUNTER — Ambulatory Visit (INDEPENDENT_AMBULATORY_CARE_PROVIDER_SITE_OTHER): Payer: Medicare Other | Admitting: Internal Medicine

## 2011-09-14 ENCOUNTER — Encounter: Payer: Self-pay | Admitting: Internal Medicine

## 2011-09-14 VITALS — BP 124/76 | HR 80 | Temp 98.3°F | Wt 149.5 lb

## 2011-09-14 DIAGNOSIS — R413 Other amnesia: Secondary | ICD-10-CM

## 2011-09-14 DIAGNOSIS — Z8673 Personal history of transient ischemic attack (TIA), and cerebral infarction without residual deficits: Secondary | ICD-10-CM

## 2011-09-14 LAB — VITAMIN B12: Vitamin B-12: 683 pg/mL (ref 211–911)

## 2011-09-14 LAB — FOLATE: Folate: >20 ng/mL

## 2011-09-14 NOTE — Progress Notes (Signed)
Quick Note:  She already knows she needs a colonoscopy - need to add the egd ______

## 2011-09-14 NOTE — Progress Notes (Signed)
Quick Note:  SLP ?'s esophageal dysphagia  She needs a colonoscopy due to hx polyps and egd for dysphagia ______

## 2011-09-19 DIAGNOSIS — Z8673 Personal history of transient ischemic attack (TIA), and cerebral infarction without residual deficits: Secondary | ICD-10-CM | POA: Insufficient documentation
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
## 2011-09-19 NOTE — Patient Instructions (Signed)
We will arrange for you to see neurologist in the near future. Keep colonoscopy appointment.

## 2011-10-23 ENCOUNTER — Encounter: Payer: Medicare Other | Admitting: Internal Medicine

## 2011-10-29 ENCOUNTER — Other Ambulatory Visit: Payer: Medicare Other | Admitting: Internal Medicine

## 2011-11-03 ENCOUNTER — Ambulatory Visit (AMBULATORY_SURGERY_CENTER): Payer: Medicare Other | Admitting: *Deleted

## 2011-11-03 VITALS — Ht 68.0 in | Wt 149.0 lb

## 2011-11-03 DIAGNOSIS — Z1211 Encounter for screening for malignant neoplasm of colon: Secondary | ICD-10-CM

## 2011-11-03 MED ORDER — PEG-KCL-NACL-NASULF-NA ASC-C 100 G PO SOLR: ORAL | Status: DC

## 2011-11-03 NOTE — Progress Notes (Signed)
[name] cancelled her endoscopy until she gets her allergies under control.  She's very anxious about the sedation propofol cause she read it can cause permanent memory loss.  I assured her she would not be receiving the sedation for an extended period of time and that she would be well cared for by the CRNA and other staff members in the procedure room.  She said she had (epigastric-chest pain) pain with her last colon about 5-6 years ago after being given the sedation prior to being taken to the procedure room.

## 2011-11-09 ENCOUNTER — Telehealth: Payer: Self-pay

## 2011-11-09 NOTE — Telephone Encounter (Signed)
Its her decision - can discuss when she comes for colonoscopy

## 2011-11-09 NOTE — Telephone Encounter (Signed)
Patient canceled the EGD portion of the procedure on 11/03/11.  She states she is not interested in having the EGD.  She is scheduled for an EGD only on 11/12/11.  I asked her why she does not want EGD and she states that the swallowing problems she is having don't bother her enough to have EGD.

## 2011-11-09 NOTE — Telephone Encounter (Signed)
Message copied by Annett [name] on Mon Nov 09, 2011  2:58 PM ------      Message from: Karna Christmas D      Created: Mon Nov 09, 2011 10:45 AM       Samuel Jester...            This pt came into office and said she is sch'd for a COL/Endo and I only see where she is sch'd for a COL on 11-12-11.       IF she is sch'd for a endo she wants to cancel that procedure and just do the Colonoscopy.

## 2011-11-12 ENCOUNTER — Ambulatory Visit (AMBULATORY_SURGERY_CENTER): Payer: Medicare Other | Admitting: Internal Medicine

## 2011-11-12 ENCOUNTER — Encounter: Payer: Self-pay | Admitting: Internal Medicine

## 2011-11-12 VITALS — BP 107/72 | HR 80 | Temp 97.7°F | Resp 20 | Ht 68.0 in | Wt 149.0 lb

## 2011-11-12 DIAGNOSIS — D126 Benign neoplasm of colon, unspecified: Secondary | ICD-10-CM

## 2011-11-12 DIAGNOSIS — Z1211 Encounter for screening for malignant neoplasm of colon: Secondary | ICD-10-CM

## 2011-11-12 DIAGNOSIS — Z860100 Personal history of colon polyps, unspecified: Secondary | ICD-10-CM | POA: Insufficient documentation

## 2011-11-12 DIAGNOSIS — Z800 Family history of malignant neoplasm of digestive organs: Secondary | ICD-10-CM

## 2011-11-12 MED ORDER — SODIUM CHLORIDE 0.9 % IV SOLN: 500.0000 mL | INTRAVENOUS | Status: DC

## 2011-11-12 NOTE — Op Note (Signed)
[facility] [address] [place], [place]  [address]  COLONOSCOPY PROCEDURE REPORT  PATIENT:  Melissa, Osborn  MR#:  [id number] BIRTHDATE:  03/09/1935, 76 yrs. old  GENDER:  female ENDOSCOPIST:  Iva Boop, MD, [facility] REF. BY:  Sharlet Salina, M.D. PROCEDURE DATE:  11/12/2011 PROCEDURE:  Colonoscopy with snare polypectomy ASA CLASS:  Class III INDICATIONS:  surveillance and high-risk screening, history of pre-cancerous (adenomatous) colon polyps, family history of colon cancer 2 adenomas removed at index 2007 elderly father had colon cancer MEDICATIONS:   These medications were titrated to patient response per physician's verbal order, MAC sedation, administered by CRNA, propofol (Diprivan) 200 mg IV  DESCRIPTION OF PROCEDURE:   After the risks benefits and alternatives of the procedure were thoroughly explained, informed consent was obtained.  Digital rectal exam was performed and revealed no abnormalities.   The LB [id number] endoscope was introduced through the anus and advanced to the cecum, which was identified by both the appendix and ileocecal valve, without limitations.  The quality of the prep was excellent, using MoviPrep.  The instrument was then slowly withdrawn as the colon was fully examined. <<PROCEDUREIMAGES>>  FINDINGS:  Two polyps were found. They were diminutive. 4 mm transverse and splenic flexure polyps removed with cold snare and sent to pathology.  This was otherwise a normal examination of the colon.   Retroflexed views in the rectum revealed internal hemorrhoids.    The time to cecum = 4:17 minutes. The scope was then withdrawn in 9:14 minutes from the cecum and the procedure completed. COMPLICATIONS:  None ENDOSCOPIC IMPRESSION: 1) Two diminutive  polyps removed 2) Internal hemorrhoids 3) Otherwise normal examination with excellent prep RECOMMENDATIONS: NOTE: she had occasional dropped beats on rhythm strip - overall mostly  normal rhythm and rate. Could have been related to increased vagal tone during scope insertion but also happened once prior to that. Strip filed in chart. Further plans, if any per primary care. REPEAT EXAM:  In for Colonoscopy, pending biopsy results.  Iva Boop, MD, [facility]  CC:  Sharlet Salina, MD and The Patient  n. eSIGNED:   Iva Boop at 11/12/2011 09:38 AM  Helane Rima, [phone]

## 2011-11-12 NOTE — Progress Notes (Signed)
Patient did not experience any of the following events: a burn prior to discharge; a fall within the facility; wrong site/side/patient/procedure/implant event; or a hospital transfer or hospital admission upon discharge from the facility. (G8907) Patient did not have preoperative order for IV antibiotic SSI prophylaxis. (G8918)  

## 2011-11-12 NOTE — Progress Notes (Signed)
Prior to procedure beginning, EKG reading reviewed per [facility] CRNA and Dr. Leone Payor.  Pt does has a hx of first degree AV block.  S [place] notes occ PACs  [phone]- [facility] [place] CRNA notes occasional missed beats of pt's EKG, heart rate drops to 38.  Robinul 0.2mg  IV given per [facility] CRNA  [phone]- heart rate 78  Propofol given and oxygen managed per [facility] CRNA

## 2011-11-12 NOTE — Patient Instructions (Signed)
YOU HAD AN ENDOSCOPIC PROCEDURE TODAY AT THE [facility]: Refer to the procedure report that was given to you for any specific questions about what was found during the examination.  If the procedure report does not answer your questions, please call your gastroenterologist to clarify.  If you requested that your care partner not be given the details of your procedure findings, then the procedure report has been included in a sealed envelope for you to review at your convenience later.  YOU SHOULD EXPECT: Some feelings of bloating in the abdomen. Passage of more gas than usual.  Walking can help get rid of the air that was put into your GI tract during the procedure and reduce the bloating. If you had a lower endoscopy (such as a colonoscopy or flexible sigmoidoscopy) you may notice spotting of blood in your stool or on the toilet paper. If you underwent a bowel prep for your procedure, then you may not have a normal bowel movement for a few days.  DIET: Your first meal following the procedure should be a light meal and then it is ok to progress to your normal diet.  A half-sandwich or bowl of soup is an example of a good first meal.  Heavy or fried foods are harder to digest and may make you feel nauseous or bloated.  Likewise meals heavy in dairy and vegetables can cause extra gas to form and this can also increase the bloating.  Drink plenty of fluids but you should avoid alcoholic beverages for 24 hours.  ACTIVITY: Your care partner should take you home directly after the procedure.  You should plan to take it easy, moving slowly for the rest of the day.  You can resume normal activity the day after the procedure however you should NOT DRIVE or use heavy machinery for 24 hours (because of the sedation medicines used during the test).    SYMPTOMS TO REPORT IMMEDIATELY: A gastroenterologist can be reached at any hour.  During normal business hours, 8:30 AM to 5:00 PM Monday through Friday,  call [phone].  After hours and on weekends, please call the GI answering service at [phone] who will take a message and have the physician on call contact you.   Following lower endoscopy (colonoscopy or flexible sigmoidoscopy):  Excessive amounts of blood in the stool  Significant tenderness or worsening of abdominal pains  Swelling of the abdomen that is new, acute  Fever of 100F or higher  Following upper endoscopy (EGD)  Vomiting of blood or coffee ground material  New chest pain or pain under the shoulder blades  Painful or persistently difficult swallowing  New shortness of breath  Fever of 100F or higher  Black, tarry-looking stools  FOLLOW UP: If any biopsies were taken you will be contacted by phone or by letter within the next 1-3 weeks.  Call your gastroenterologist if you have not heard about the biopsies in 3 weeks.  Our staff will call the home number listed on your records the next business day following your procedure to check on you and address any questions or concerns that you may have at that time regarding the information given to you following your procedure. This is a courtesy call and so if there is no answer at the home number and we have not heard from you through the emergency physician on call, we will assume that you have returned to your regular daily activities without incident.  SIGNATURES/CONFIDENTIALITY: You and/or your care   partner have signed paperwork which will be entered into your electronic medical record.  These signatures attest to the fact that that the information above on your After Visit Summary has been reviewed and is understood.  Full responsibility of the confidentiality of this discharge information lies with you and/or your care-partner.   Polyp and hemorrhoid information given  

## 2011-11-13 ENCOUNTER — Telehealth: Payer: Self-pay | Admitting: *Deleted

## 2011-11-13 NOTE — Telephone Encounter (Signed)
Number disconnected, unable to leave message.

## 2011-11-20 ENCOUNTER — Encounter: Payer: Self-pay | Admitting: Internal Medicine

## 2011-11-20 NOTE — Progress Notes (Signed)
Quick Note:  Correction  Consider recall colonoscopy in 10/2016 as she has had adenomas in past and an elderly father had colon caner  ______

## 2011-11-20 NOTE — Progress Notes (Signed)
Quick Note:  Hyperplastic polyps Age 77 No colon recall ______

## 2011-11-23 ENCOUNTER — Encounter: Payer: Self-pay | Admitting: Internal Medicine

## 2012-11-17 ENCOUNTER — Emergency Department (HOSPITAL_COMMUNITY)
Admission: EM | Admit: 2012-11-17 | Discharge: 2012-11-17 | Disposition: A | Discharge status: Home / Self Care | Payer: Medicare Other | Attending: Emergency Medicine | Admitting: Emergency Medicine

## 2012-11-17 ENCOUNTER — Encounter (HOSPITAL_COMMUNITY): Payer: Self-pay | Admitting: *Deleted

## 2012-11-17 DIAGNOSIS — Z8679 Personal history of other diseases of the circulatory system: Secondary | ICD-10-CM | POA: Insufficient documentation

## 2012-11-17 DIAGNOSIS — Z860100 Personal history of colon polyps, unspecified: Secondary | ICD-10-CM | POA: Insufficient documentation

## 2012-11-17 DIAGNOSIS — J45909 Unspecified asthma, uncomplicated: Secondary | ICD-10-CM | POA: Insufficient documentation

## 2012-11-17 DIAGNOSIS — Z8659 Personal history of other mental and behavioral disorders: Secondary | ICD-10-CM | POA: Insufficient documentation

## 2012-11-17 DIAGNOSIS — Z8739 Personal history of other diseases of the musculoskeletal system and connective tissue: Secondary | ICD-10-CM | POA: Insufficient documentation

## 2012-11-17 DIAGNOSIS — Z79899 Other long term (current) drug therapy: Secondary | ICD-10-CM | POA: Insufficient documentation

## 2012-11-17 DIAGNOSIS — R110 Nausea: Secondary | ICD-10-CM | POA: Insufficient documentation

## 2012-11-17 DIAGNOSIS — R55 Syncope and collapse: Secondary | ICD-10-CM | POA: Insufficient documentation

## 2012-11-17 DIAGNOSIS — R109 Unspecified abdominal pain: Secondary | ICD-10-CM | POA: Insufficient documentation

## 2012-11-17 DIAGNOSIS — E785 Hyperlipidemia, unspecified: Secondary | ICD-10-CM | POA: Insufficient documentation

## 2012-11-17 DIAGNOSIS — Z862 Personal history of diseases of the blood and blood-forming organs and certain disorders involving the immune mechanism: Secondary | ICD-10-CM | POA: Insufficient documentation

## 2012-11-17 DIAGNOSIS — Z8639 Personal history of other endocrine, nutritional and metabolic disease: Secondary | ICD-10-CM | POA: Insufficient documentation

## 2012-11-17 DIAGNOSIS — R42 Dizziness and giddiness: Secondary | ICD-10-CM | POA: Insufficient documentation

## 2012-11-17 HISTORY — DX: Other allergy status, other than to drugs and biological substances: Z91.09

## 2012-11-17 LAB — COMPREHENSIVE METABOLIC PANEL
CO2: 26 mEq/L (ref 19–32)
Calcium: 9.4 mg/dL (ref 8.4–10.5)
Creatinine, Ser: 0.85 mg/dL (ref 0.50–1.10)
GFR calc Af Amer: 76 mL/min — ABNORMAL LOW (ref >90)
GFR calc non Af Amer: 65 mL/min — ABNORMAL LOW (ref >90)
Glucose, Bld: 118 mg/dL — ABNORMAL HIGH (ref 70–99)
Total Protein: 6.6 g/dL (ref 6.0–8.3)

## 2012-11-17 LAB — CBC
Hemoglobin: 12.8 g/dL (ref 12.0–15.0)
MCH: 30.3 pg (ref 26.0–34.0)
MCHC: 33.5 g/dL (ref 30.0–36.0)
MCV: 90.3 fL (ref 78.0–100.0)
RBC: 4.23 MIL/uL (ref 3.87–5.11)

## 2012-11-17 NOTE — ED Notes (Signed)
Pt to ER via [facility] after a syncopal episode at the grocery store; pt was standing in line with husband and he turned and she was in the floor; pt denies pain; EMS cleared C-Spine; pt was not brought in on C-Spine precautions; pt denies complaints at this time.

## 2012-11-17 NOTE — ED Provider Notes (Signed)
History     CSN: [id number]  Arrival date & time 11/17/12  2037   First MD Initiated Contact with Patient 11/17/12 2051      Chief Complaint  Patient presents with  . Loss of Consciousness    (Consider location/radiation/quality/duration/timing/severity/associated sxs/prior treatment) Patient is a 78 y.o. female presenting with syncope. The history is provided by the patient.  Loss of Consciousness Associated symptoms: no chest pain, no confusion, no fever, no headaches, no palpitations, no shortness of breath and no vomiting   pt s/p syncopal event at grocery store tonight. States had transient gi upset describes as crampy mid abd discomfort, nausea then felt lightheaded/faint, bent over and put head down on some boxes, then had brief syncopal event. No seizure activity or postictal period. Pt denies significant trauma or injury. No headache. No neck or back pain. Currently feels as baseline, asymptomatic. Notes similar episodes/lightheadedness in past, but no prior syncope or specific dx. Denies any blood loss, melena, or rectal bleeding. No recent change in meds or new meds.  No dysuria or gu c/o. No fever or chills. Denies palpitations or sense of fast or irregular heartbeat. No current or recent cp or discomfort. No unusual doe or fatigue.   Past Medical History  Diagnosis Date  . Hyperlipidemia   . Thyroid disease   . AV node arrhythmia   . Arthritis   . Asthma   . Depression   . Colon polyps   . Environmental allergies     Past Surgical History  Procedure Laterality Date  . Knee surgery    . Salpingoophorectomy      right  . Appendectomy    . Colonoscopy  2007    Family History  Problem Relation Age of Onset  . Colon cancer Father   . Colon polyps Brother   . Diabetes Brother     maternal aunt    History  Substance Use Topics  . Smoking status: Never Smoker   . Smokeless tobacco: Never Used  . Alcohol Use: No     Comment: rare    OB History   Grav Para  Term Preterm Abortions TAB SAB Ect Mult Living                  Review of Systems  Constitutional: Negative for fever.  HENT: Negative for neck pain.   Eyes: Negative for redness.  Respiratory: Negative for cough and shortness of breath.   Cardiovascular: Positive for syncope. Negative for chest pain, palpitations and leg swelling.  Gastrointestinal: Negative for vomiting, abdominal pain, diarrhea and blood in stool.  Genitourinary: Negative for dysuria and flank pain.  Musculoskeletal: Negative for back pain.  Skin: Negative for rash.  Neurological: Positive for syncope. Negative for headaches.  Hematological: Does not bruise/bleed easily.  Psychiatric/Behavioral: Negative for confusion.    Allergies  Sulfa antibiotics  Home Medications   Current Outpatient Rx  Name  Route  Sig  Dispense  Refill  . Cholecalciferol (VITAMIN D-3 PO)   Oral   Take 1 capsule by mouth daily.         Marland Kitchen co-enzyme Q-10 30 MG capsule   Oral   Take 100 mg by mouth daily.          . Lactobacillus (ACIDOPHOLUS PO)   Oral   Take by mouth daily. 1 -2 tablets every day         . Multiple Vitamins-Minerals (MULTIVITAMIN WITH MINERALS) tablet   Oral   Take 1 tablet  by mouth daily.             BP 118/58  Pulse 74  Temp(Src) 98.3 F (36.8 C) (Oral)  Resp 13  SpO2 95%  Physical Exam  Nursing note and vitals reviewed. Constitutional: She is oriented to person, place, and time. She appears well-developed and well-nourished. No distress.  HENT:  Mouth/Throat: Oropharynx is clear and moist.  Eyes: Conjunctivae are normal. No scleral icterus.  Neck: Normal range of motion. Neck supple. No tracheal deviation present.  Cardiovascular: Normal rate, regular rhythm, normal heart sounds and intact distal pulses.   No murmur heard. Pulmonary/Chest: Effort normal and breath sounds normal. No respiratory distress.  Abdominal: Soft. Normal appearance and bowel sounds are normal. She exhibits no  distension. There is no tenderness.  Musculoskeletal: She exhibits no edema and no tenderness.  CTLS spine, non tender, aligned, no step off.   Neurological: She is alert and oriented to person, place, and time.  Skin: Skin is warm and dry. No rash noted.  Psychiatric: She has a normal mood and affect.    ED Course  Procedures (including critical care time)   Results for orders placed during the hospital encounter of 11/17/12  CBC      Result Value Range   WBC 7.0  4.0 - 10.5 K/uL   RBC 4.23  3.87 - 5.11 MIL/uL   Hemoglobin 12.8  12.0 - 15.0 g/dL   HCT 40.9  81.1 - 91.4 %   MCV 90.3  78.0 - 100.0 fL   MCH 30.3  26.0 - 34.0 pg   MCHC 33.5  30.0 - 36.0 g/dL   RDW 78.2  95.6 - 21.3 %   Platelets 197  150 - 400 K/uL  COMPREHENSIVE METABOLIC PANEL      Result Value Range   Sodium 138  135 - 145 mEq/L   Potassium 3.8  3.5 - 5.1 mEq/L   Chloride 104  96 - 112 mEq/L   CO2 26  19 - 32 mEq/L   Glucose, Bld 118 (*) 70 - 99 mg/dL   BUN 26 (*) 6 - 23 mg/dL   Creatinine, Ser 0.86  0.50 - 1.10 mg/dL   Calcium 9.4  8.4 - 57.8 mg/dL   Total Protein 6.6  6.0 - 8.3 g/dL   Albumin 3.5  3.5 - 5.2 g/dL   AST 29  0 - 37 U/L   ALT 16  0 - 35 U/L   Alkaline Phosphatase 67  39 - 117 U/L   Total Bilirubin 0.3  0.3 - 1.2 mg/dL   GFR calc non Af Amer 65 (*) >90 mL/min   GFR calc Af Amer 76 (*) >90 mL/min       MDM  Iv ns. Monitor. Labs.   Date: 11/17/2012  Rate: 73  Rhythm: normal sinus rhythm  QRS Axis: left  Intervals: PR prolonged  ST/T Wave abnormalities: normal  Conduction Disutrbances:first-degree A-V block   Narrative Interpretation:   Old EKG Reviewed: unchanged  Reviewed nursing notes and prior charts for additional history.   Reviewed prior charts - pt noted w hr 38 prior to recent colonscopy, pt also notes prior remote episodes lightheadedness, although no prior syncopal events - pt states these events preceded in past by gi symptoms/crampy abd pain w nausea.  ?sick  sinus, vs other dysrhythmia vs vagally mediated symptomatic bradycardia.    Pt remains in sinus rhythm hr 66-76 during ED stay. Pt remains asymptomatic. Discussed plan for admission.  Pt  requests d/c to home, she states she will follow up as outpt w cardiology, but does not want to be admitted to hospitl - will d/c w referral for close card f/u, possible holter/outpt monitoring.            Suzi Roots, MD 11/17/12 2252

## 2012-11-18 ENCOUNTER — Ambulatory Visit (INDEPENDENT_AMBULATORY_CARE_PROVIDER_SITE_OTHER): Payer: Medicare Other | Admitting: Cardiology

## 2012-11-18 ENCOUNTER — Telehealth: Payer: Self-pay | Admitting: Internal Medicine

## 2012-11-18 ENCOUNTER — Encounter (INDEPENDENT_AMBULATORY_CARE_PROVIDER_SITE_OTHER): Payer: Medicare Other

## 2012-11-18 ENCOUNTER — Encounter: Payer: Self-pay | Admitting: Cardiology

## 2012-11-18 ENCOUNTER — Encounter: Payer: Self-pay | Admitting: *Deleted

## 2012-11-18 VITALS — BP 126/70 | HR 58 | Ht 67.5 in | Wt 150.0 lb

## 2012-11-18 DIAGNOSIS — R55 Syncope and collapse: Secondary | ICD-10-CM

## 2012-11-18 DIAGNOSIS — E039 Hypothyroidism, unspecified: Secondary | ICD-10-CM

## 2012-11-18 LAB — TSH: TSH: 3.7 u[IU]/mL (ref 0.35–5.50)

## 2012-11-18 NOTE — Patient Instructions (Addendum)
Will obtain labs today and call you with the results (tsh)  Your physician has requested that you have an echocardiogram. Echocardiography is a painless test that uses sound waves to create images of your heart. It provides your doctor with information about the size and shape of your heart and how well your heart's chambers and valves are working. This procedure takes approximately one hour. There are no restrictions for this procedure.  Your physician has recommended that you wear an event monitor. Event monitors are medical devices that record the heart's electrical activity. Doctors most often [place] these monitors to diagnose arrhythmias. Arrhythmias are problems with the speed or rhythm of the heartbeat. The monitor is a small, portable device. You can wear one while you do your normal daily activities. This is usually used to diagnose what is causing palpitations/syncope (passing out).  FOLLOW UP AS NEEDED

## 2012-11-18 NOTE — Progress Notes (Signed)
Patient ID: [name], female   DOB: 06-Mar-1935, 78 y.o.   MRN: [id number] E-cardio Bramer 30 day event monitor placed on patient.

## 2012-11-18 NOTE — Progress Notes (Signed)
[name] Date of Birth:  [birth year] [facility] [address] [place], [place]  [address] [phone]         Fax   [phone]  History of Present Illness: This 78 year old woman is seen for a post emergency room consultation visit.  We have not seen her before but we see her husband.  [name] comes in today for evaluation of an episode of syncope which she experienced yesterday.  She was shopping at [employer] with her husband.  This was after they had had some supper.  Suddenly a wave of lightheadedness came over her and she attempted to sit or lie on the floor.  It is not clear whether she totally lost consciousness or not.  A passerby went to her and gently lifted up her head and she opened her eyes promptly.  There was no seizure activity.  She was taken to the emergency room where her evaluation was unremarkable including EKG and lab work.  In retrospect the patient has been having similar spells usually occurring at home over the past year.  She will have a feeling of lightheadedness and her head and in her arms will start to feel limp and normally she can make it to her bed and lying down after which the symptoms passed off.  She has not been aware of any racing of her heart and she has not been experiencing any chest pain and she does not have any history of known heart problems.  She and her husband walk regularly for exercise and she notes only mild exertional dyspnea on hills which she attributes to having had asthma as a child.  No history to suggest angina pectoris.  The episodes that she experiences at home are often accompanied by a slight queasiness or nausea but no actual vomiting.  Current Outpatient Prescriptions  Medication Sig Dispense Refill  . Cholecalciferol (VITAMIN D-3 PO) Take 1 capsule by mouth daily.      Marland Kitchen Fexofenadine HCl (ALLEGRA ALLERGY CHILDRENS PO) Take by mouth as needed.      . GuaiFENesin (MUCINEX PO) Take by mouth as needed.       . Lactobacillus (ACIDOPHOLUS PO) Take by mouth daily. 1 -2 tablets every day      . methylcellulose (ARTIFICIAL TEARS) 1 % ophthalmic solution Place 1 drop into both eyes as needed (eye irritation).      . Multiple Vitamins-Minerals (MULTIVITAMIN WITH MINERALS) tablet Take 1 tablet by mouth daily.         No current facility-administered medications for this visit.    Allergies  Allergen Reactions  . Sulfa Antibiotics Diarrhea and Rash    Patient Active Problem List   Diagnosis Date Noted  . Syncope 11/18/2012  . Personal history of adenomatous colonic polyps 11/12/2011  . History of TIA (transient ischemic attack) 09/19/2011  . Hypothyroidism 01/13/2011  . Hyperlipidemia 01/13/2011  . First degree AV block 01/13/2011  . Gait disturbance 01/13/2011    History  Smoking status  . Never Smoker   Smokeless tobacco  . Never Used    History  Alcohol Use No    Comment: rare    Family History  Problem Relation Age of Onset  . Colon cancer Father   . Colon polyps Brother   . Diabetes Brother     maternal aunt    Review of Systems: Constitutional: no fever chills diaphoresis or fatigue or change in weight.  Head and neck: no hearing loss, no  epistaxis, no photophobia or visual disturbance. Respiratory: No cough, shortness of breath or wheezing. Cardiovascular: No chest pain peripheral edema, palpitations. Gastrointestinal: No abdominal distention, no abdominal pain, no change in bowel habits hematochezia or melena. Genitourinary: No dysuria, no frequency, no urgency, no nocturia. Musculoskeletal:No arthralgias, no back pain, no gait disturbance or myalgias. Neurological: No dizziness, no headaches, no numbness, no seizures, no syncope, no weakness, no tremors. Hematologic: No lymphadenopathy, no easy bruising. Psychiatric: No confusion, no hallucinations, no sleep disturbance.    Physical Exam: Filed Vitals:   11/18/12 1417  BP: 126/70  Pulse: 58   the general  appearance reveals a well-developed well-nourished woman in no distress.The head and neck exam reveals pupils equal and reactive.  Extraocular movements are full.  There is no scleral icterus.  The mouth and pharynx are normal.  The neck is supple.  The carotids reveal no bruits.  The jugular venous pressure is normal.  The  thyroid is not enlarged.  There is no lymphadenopathy.  The chest is clear to percussion and auscultation.  There are no rales or rhonchi.  Expansion of the chest is symmetrical.  The precordium is quiet.  The first heart sound is normal.  The second heart sound is physiologically split.  There is no murmur gallop rub or click.  There is no abnormal lift or heave.  The abdomen is soft and nontender.  The bowel sounds are normal.  The liver and spleen are not enlarged.  There are no abdominal masses.  There are no abdominal bruits.  Extremities reveal good pedal pulses.  There is no phlebitis or edema.  There is no cyanosis or clubbing.  Strength is normal and symmetrical in all extremities.  There is no lateralizing weakness.  There are no sensory deficits.  The skin is warm and dry.  There is no rash.  EKG was repeated and shows sinus bradycardia with first degree AV block and left anterior fascicular block and no ischemic changes.  Assessment / Plan: Impression: Syncope or near-syncope of uncertain etiology.  She has been having this same type of spell intermittently over the past year.  This may be secondary to an arrhythmia.  She may be having vasovagal attacks. Plan will be to have her return for a two-dimensional echocardiogram to evaluate further.  We will also have her wear an event monitor for about 4 weeks.  We will also check a TSH level today since she states that at times in the past her thyroid function has been borderline abnormal. No new medications were prescribed today.

## 2012-11-18 NOTE — Telephone Encounter (Signed)
I have reviewed ED notes

## 2012-11-21 ENCOUNTER — Telehealth: Payer: Self-pay | Admitting: *Deleted

## 2012-11-21 NOTE — Telephone Encounter (Signed)
Mailed copy of labs and left message to call if any questions  

## 2012-11-21 NOTE — Telephone Encounter (Signed)
Message copied by Burnell Blanks on Mon Nov 21, 2012  2:05 PM ------      Message from: Cassell Clement      Created: Fri Nov 18, 2012  6:54 PM       Thyroid test normal. ------

## 2012-11-26 ENCOUNTER — Emergency Department (HOSPITAL_COMMUNITY)
Admission: EM | Admit: 2012-11-26 | Discharge: 2012-11-26 | Disposition: A | Discharge status: Home / Self Care | Payer: Medicare Other | Attending: Emergency Medicine | Admitting: Emergency Medicine

## 2012-11-26 ENCOUNTER — Encounter (HOSPITAL_COMMUNITY): Payer: Self-pay

## 2012-11-26 DIAGNOSIS — E079 Disorder of thyroid, unspecified: Secondary | ICD-10-CM | POA: Insufficient documentation

## 2012-11-26 DIAGNOSIS — Z8679 Personal history of other diseases of the circulatory system: Secondary | ICD-10-CM | POA: Insufficient documentation

## 2012-11-26 DIAGNOSIS — Z860100 Personal history of colon polyps, unspecified: Secondary | ICD-10-CM | POA: Insufficient documentation

## 2012-11-26 DIAGNOSIS — Z8659 Personal history of other mental and behavioral disorders: Secondary | ICD-10-CM | POA: Insufficient documentation

## 2012-11-26 DIAGNOSIS — J45909 Unspecified asthma, uncomplicated: Secondary | ICD-10-CM | POA: Insufficient documentation

## 2012-11-26 DIAGNOSIS — R55 Syncope and collapse: Secondary | ICD-10-CM

## 2012-11-26 DIAGNOSIS — E785 Hyperlipidemia, unspecified: Secondary | ICD-10-CM | POA: Insufficient documentation

## 2012-11-26 DIAGNOSIS — Z79899 Other long term (current) drug therapy: Secondary | ICD-10-CM | POA: Insufficient documentation

## 2012-11-26 DIAGNOSIS — I499 Cardiac arrhythmia, unspecified: Secondary | ICD-10-CM | POA: Insufficient documentation

## 2012-11-26 DIAGNOSIS — J309 Allergic rhinitis, unspecified: Secondary | ICD-10-CM | POA: Insufficient documentation

## 2012-11-26 DIAGNOSIS — Z8739 Personal history of other diseases of the musculoskeletal system and connective tissue: Secondary | ICD-10-CM | POA: Insufficient documentation

## 2012-11-26 LAB — CBC WITH DIFFERENTIAL/PLATELET
Basophils Absolute: 0 10*3/uL (ref 0.0–0.1)
Basophils Relative: 0 % (ref 0–1)
Eosinophils Absolute: 0.1 10*3/uL (ref 0.0–0.7)
HCT: 38.3 % (ref 36.0–46.0)
MCH: 31.3 pg (ref 26.0–34.0)
MCHC: 34.5 g/dL (ref 30.0–36.0)
Monocytes Absolute: 0.5 10*3/uL (ref 0.1–1.0)
Monocytes Relative: 11 % (ref 3–12)
Neutro Abs: 2.9 10*3/uL (ref 1.7–7.7)
RDW: 12.7 % (ref 11.5–15.5)

## 2012-11-26 LAB — BASIC METABOLIC PANEL
BUN: 22 mg/dL (ref 6–23)
Calcium: 9 mg/dL (ref 8.4–10.5)
Chloride: 104 mEq/L (ref 96–112)
Creatinine, Ser: 0.81 mg/dL (ref 0.50–1.10)
GFR calc Af Amer: 80 mL/min — ABNORMAL LOW (ref >90)
GFR calc non Af Amer: 69 mL/min — ABNORMAL LOW (ref >90)

## 2012-11-26 NOTE — Consult Note (Signed)
  Reason for Consult:Bradycardia with a h/o syncope  Referring Physician: Dr. Sinda Du is an 78 y.o. female.   HPI: [name] is a 78 yo woman with a h/o syncope who was seen in the ED several days ago after experiencing a syncope episode while shopping. She was placed with a cardiac monitor and was found to have a 5 second pause on cardiac monitoring and instructed to go to the ED. She denies chest pain, sob, or edema. She has had no additional syncope spells. She does note abdominal symptoms which are transient and associated with her spells. She notes that for the past 5 months she will have dizzy episodes which resolve suddenly and last only a few seconds but which are associated with abdominal discomfort. No palpitations  PMH: Past Medical History  Diagnosis Date  . Hyperlipidemia   . Thyroid disease   . AV node arrhythmia   . Arthritis   . Asthma   . Depression   . Colon polyps   . Environmental allergies     PSHX: Past Surgical History  Procedure Laterality Date  . Knee surgery    . Salpingoophorectomy      right  . Appendectomy    . Colonoscopy  2007    FAMHX: Family History  Problem Relation Age of Onset  . Colon cancer Father   . Colon polyps Brother   . Diabetes Brother     maternal aunt    Social History:  reports that she has never smoked. She has never used smokeless tobacco. She reports that she does not drink alcohol or use illicit drugs.  Allergies:  Allergies  Allergen Reactions  . Sulfa Antibiotics Diarrhea and Rash    Medications: reviewed  No results found.  ROS  As stated in the HPI and negative for all other systems.  Physical Exam  Vitals:Blood pressure 115/71, pulse 71, temperature 98.4 F (36.9 C), temperature source Oral, resp. rate 15, SpO2 99.00%.  Well appearing 78 yo woman, NAD HEENT: Unremarkable Neck:  No JVD, no thyromegally Back:  No CVA tenderness Lungs:  Clear with no wheezes, rales, or  rhonchi HEART:  Regular rate rhythm, no murmurs, no rubs, no clicks Abd:  Flat, positive bowel sounds, no organomegally, no rebound, no guarding Ext:  2 plus pulses, no edema, no cyanosis, no clubbing Skin:  No rashes no nodules Neuro:  CN II through XII intact, motor grossly intact  Labs - reviewed  Assessment/Plan: 1.  Syncope 2. Documented nocturnal bradycardia with a narrow QRS complex. Rec: I have discussed the patients problems. She has syncope almost certainly due to transient and severe AV block. She is not on any av nodal blocking drugs. She has a class 1 indication for a PPM. I have discussed the risks/benefits/goals/indications for PPM. The patient is not interested currently in a pacemaker. She is scheduled for a 2 D echo in several days. She promises not to drive. I have warned her that by going home, she could have additional bradycardia and even pass out and injure herself but she is insistent on going home. She appears to have more confidence in her husband's MD, Dr. Patty Sermons and I will discuss the case with him.   [name],M.D.  Sharlot Gowda TaylorMD 11/26/2012, 1:38 PM

## 2012-11-26 NOTE — ED Provider Notes (Signed)
History     CSN: [id number]  Arrival date & time 11/26/12  0821   First MD Initiated Contact with Patient 11/26/12 [phone]      Chief Complaint  Patient presents with  . Irregular Heart Beat    (Consider location/radiation/quality/duration/timing/severity/associated sxs/prior treatment) HPI Pt with recent syncopal event and f/u with Dr Patty Sermons. Pt placed on holter monitor. She has had no complaints since seeing cardiologist. Specifically denies lightheadedness, N/V, CP, SOB, weakness. Pt transmitted holter monitor readings this morning and was called to go into the ED. Pt continues to be asymptomatic.  Past Medical History  Diagnosis Date  . Hyperlipidemia   . Thyroid disease   . AV node arrhythmia   . Arthritis   . Asthma   . Depression   . Colon polyps   . Environmental allergies     Past Surgical History  Procedure Laterality Date  . Knee surgery    . Salpingoophorectomy      right  . Appendectomy    . Colonoscopy  2007    Family History  Problem Relation Age of Onset  . Colon cancer Father   . Colon polyps Brother   . Diabetes Brother     maternal aunt    History  Substance Use Topics  . Smoking status: Never Smoker   . Smokeless tobacco: Never Used  . Alcohol Use: No     Comment: rare    OB History   Grav Para Term Preterm Abortions TAB SAB Ect Mult Living                  Review of Systems  Constitutional: Negative for fever, chills and fatigue.  HENT: Negative for neck pain.   Eyes: Negative for visual disturbance.  Respiratory: Negative for cough, shortness of breath and wheezing.   Cardiovascular: Negative for chest pain, palpitations and leg swelling.  Gastrointestinal: Negative for nausea, vomiting, abdominal pain and diarrhea.  Genitourinary: Negative for dysuria and flank pain.  Musculoskeletal: Negative for back pain.  Skin: Negative for rash and wound.  Neurological: Negative for dizziness, syncope, weakness, light-headedness,  numbness and headaches.  All other systems reviewed and are negative.    Allergies  Sulfa antibiotics  Home Medications   Current Outpatient Rx  Name  Route  Sig  Dispense  Refill  . Cholecalciferol (VITAMIN D-3 PO)   Oral   Take 1 capsule by mouth daily.         Marland Kitchen Fexofenadine HCl (ALLEGRA ALLERGY CHILDRENS PO)   Oral   Take by mouth as needed.         . GuaiFENesin (MUCINEX PO)   Oral   Take 1 tablet by mouth as needed (for mucous).          . Lactobacillus (ACIDOPHOLUS PO)   Oral   Take 1 tablet by mouth daily. 1 -2 tablets every day         . methylcellulose (ARTIFICIAL TEARS) 1 % ophthalmic solution   Both Eyes   Place 1 drop into both eyes as needed (eye irritation).         . Multiple Vitamins-Minerals (MULTIVITAMIN WITH MINERALS) tablet   Oral   Take 1 tablet by mouth daily.             BP 122/63  Pulse 75  Temp(Src) 98.4 F (36.9 C) (Oral)  Resp 15  SpO2 100%  Physical Exam  Nursing note and vitals reviewed. Constitutional: She is oriented to person, place, and  time. She appears well-developed and well-nourished. No distress.  HENT:  Head: Normocephalic and atraumatic.  Mouth/Throat: Oropharynx is clear and moist.  Eyes: EOM are normal. Pupils are equal, round, and reactive to light.  Neck: Normal range of motion. Neck supple.  Cardiovascular: Normal rate.   Regular rhythm with occasional dropped beat  Pulmonary/Chest: Effort normal and breath sounds normal. No respiratory distress. She has no wheezes. She has no rales.  Abdominal: Soft. Bowel sounds are normal. She exhibits no distension and no mass. There is no tenderness. There is no rebound and no guarding.  Musculoskeletal: Normal range of motion. She exhibits no edema and no tenderness.  Neurological: She is alert and oriented to person, place, and time.  5/5 motor in all ext, sensation intact  Skin: Skin is warm and dry. No rash noted. No erythema.  Psychiatric: She has a normal  mood and affect. Her behavior is normal.    ED Course  Procedures (including critical care time)  Labs Reviewed  BASIC METABOLIC PANEL - Abnormal; Notable for the following:    GFR calc non Af Amer 69 (*)    GFR calc Af Amer 80 (*)    All other components within normal limits  CBC WITH DIFFERENTIAL   No results found.   1. Syncope   2. Arrhythmia      Date: 11/26/2012  Rate: 65  Rhythm: Mobitz II  QRS Axis: normal  Intervals: PR prolonged  ST/T Wave abnormalities: nonspecific T wave changes  Conduction Disutrbances:second-degree A-V block, ( Mobitz II )  Narrative Interpretation:   Old EKG Reviewed: changes noted    MDM  Discussed with Cardiology who will see in ED.   Dr Ladona Ridgel had extended discussion with pt and her husband. Recommended admission. Pt refused and stated she wanted to f/u with Dr Patty Sermons. Return precautions have been given. Pt is aware of possible risks of leaving.       Loren Racer, MD 11/26/12 [phone]

## 2012-11-26 NOTE — ED Notes (Signed)
Pt seen in hospital recently for a syncopal episode.  Pt is seeing Dr. Patty Sermons, cardiologist and wearing a holter monitor.  Pt states she received a phone call this morning and told she had an event and needed to go to ED.  Pt denies any symptoms.  Pt in bed alert and oriented x 4 and in NAD.  Monitors applied and pt's holter monitor left on as well.

## 2012-11-28 ENCOUNTER — Telehealth: Payer: Self-pay | Admitting: Cardiology

## 2012-11-28 NOTE — Telephone Encounter (Signed)
Left message to call back  

## 2012-11-28 NOTE — Telephone Encounter (Signed)
New problem  Pt's husband wants to know if she can take the even monitor off so she can be more comfortable.  He said that she is going to have to get a pacemaker. Pt is coming in the morning for an echo.

## 2012-11-28 NOTE — Telephone Encounter (Signed)
Did discuss with  Dr. Patty Sermons and he would like for patient to continue to wear monitor. Discussed pacemaker with Pablo Lawrence RN, she will schedule for Thursday 6/12 arrive at noon. Will instruct patient to go back to ED if any symptoms.

## 2012-11-29 ENCOUNTER — Encounter: Payer: Self-pay | Admitting: *Deleted

## 2012-11-29 ENCOUNTER — Encounter (HOSPITAL_COMMUNITY): Payer: Self-pay | Admitting: Pharmacy Technician

## 2012-11-29 ENCOUNTER — Ambulatory Visit (HOSPITAL_COMMUNITY): Payer: Medicare Other | Attending: Cardiovascular Disease | Admitting: Radiology

## 2012-11-29 DIAGNOSIS — E785 Hyperlipidemia, unspecified: Secondary | ICD-10-CM | POA: Insufficient documentation

## 2012-11-29 DIAGNOSIS — Z8673 Personal history of transient ischemic attack (TIA), and cerebral infarction without residual deficits: Secondary | ICD-10-CM | POA: Insufficient documentation

## 2012-11-29 DIAGNOSIS — R55 Syncope and collapse: Secondary | ICD-10-CM | POA: Insufficient documentation

## 2012-11-29 DIAGNOSIS — E039 Hypothyroidism, unspecified: Secondary | ICD-10-CM | POA: Insufficient documentation

## 2012-11-29 NOTE — Progress Notes (Signed)
Echocardiogram performed.  

## 2012-11-29 NOTE — Telephone Encounter (Signed)
Spoke with patients husband and advised. Did stress that if the patient felt dizzy, like she was going to pass out, or did pass out needed to go to ED immediately. Husband verbalized all information given.

## 2012-11-30 ENCOUNTER — Telehealth: Payer: Self-pay | Admitting: Cardiology

## 2012-11-30 ENCOUNTER — Other Ambulatory Visit: Payer: Self-pay | Admitting: *Deleted

## 2012-11-30 DIAGNOSIS — R55 Syncope and collapse: Secondary | ICD-10-CM

## 2012-11-30 DIAGNOSIS — I495 Sick sinus syndrome: Secondary | ICD-10-CM

## 2012-11-30 NOTE — Telephone Encounter (Signed)
New problem   pts husband wants to know if pacemaker will take care of the blockage

## 2012-11-30 NOTE — Telephone Encounter (Signed)
Answered question regarding upcoming procedure.

## 2012-12-01 MED ORDER — SODIUM CHLORIDE 0.9 % IV SOLN
INTRAVENOUS | Status: DC
  Administered 2012-12-02: 11:00:00 via INTRAVENOUS

## 2012-12-01 MED ORDER — CHLORHEXIDINE GLUCONATE 4 % EX LIQD
60.0000 mL | Freq: Once | CUTANEOUS | Status: AC
  Administered 2012-12-02: 4 via TOPICAL
  Filled 2012-12-01: qty 60

## 2012-12-01 MED ORDER — SODIUM CHLORIDE 0.9 % IR SOLN
80.0000 mg | Status: DC
  Filled 2012-12-01: qty 2

## 2012-12-01 MED ORDER — CEFAZOLIN SODIUM-DEXTROSE 2-3 GM-% IV SOLR
2.0000 g | INTRAVENOUS | Status: DC
  Filled 2012-12-01: qty 50

## 2012-12-02 ENCOUNTER — Encounter (HOSPITAL_COMMUNITY): Payer: Self-pay | Admitting: General Practice

## 2012-12-02 ENCOUNTER — Encounter (HOSPITAL_COMMUNITY)
Admission: RE | Disposition: A | Discharge status: Home / Self Care | Payer: Self-pay | Source: Ambulatory Visit | Attending: Internal Medicine

## 2012-12-02 ENCOUNTER — Ambulatory Visit (HOSPITAL_COMMUNITY)
Admission: RE | Admit: 2012-12-02 | Discharge: 2012-12-03 | Disposition: A | Discharge status: Home / Self Care | Payer: Medicare Other | Source: Ambulatory Visit | Attending: Internal Medicine | Admitting: Internal Medicine

## 2012-12-02 ENCOUNTER — Telehealth: Payer: Self-pay | Admitting: *Deleted

## 2012-12-02 DIAGNOSIS — I495 Sick sinus syndrome: Secondary | ICD-10-CM

## 2012-12-02 DIAGNOSIS — I498 Other specified cardiac arrhythmias: Secondary | ICD-10-CM | POA: Insufficient documentation

## 2012-12-02 DIAGNOSIS — I442 Atrioventricular block, complete: Secondary | ICD-10-CM | POA: Insufficient documentation

## 2012-12-02 DIAGNOSIS — R55 Syncope and collapse: Secondary | ICD-10-CM

## 2012-12-02 HISTORY — PX: INSERT / REPLACE / REMOVE PACEMAKER: SUR710

## 2012-12-02 HISTORY — DX: Atrioventricular block, complete: I44.2

## 2012-12-02 HISTORY — PX: PERMANENT PACEMAKER INSERTION: SHX5480

## 2012-12-02 LAB — SURGICAL PCR SCREEN: Staphylococcus aureus: NEGATIVE

## 2012-12-02 SURGERY — PERMANENT PACEMAKER INSERTION
Anesthesia: LOCAL

## 2012-12-02 MED ORDER — FENTANYL CITRATE 0.05 MG/ML IJ SOLN
INTRAMUSCULAR | Status: AC
  Filled 2012-12-02: qty 2

## 2012-12-02 MED ORDER — LIDOCAINE HCL (PF) 1 % IJ SOLN
INTRAMUSCULAR | Status: AC
  Filled 2012-12-02: qty 60

## 2012-12-02 MED ORDER — MUPIROCIN 2 % EX OINT
TOPICAL_OINTMENT | CUTANEOUS | Status: AC
  Filled 2012-12-02: qty 22

## 2012-12-02 MED ORDER — CEFAZOLIN SODIUM-DEXTROSE 2-3 GM-% IV SOLR
INTRAVENOUS | Status: AC
  Filled 2012-12-02: qty 50

## 2012-12-02 MED ORDER — CEFAZOLIN SODIUM-DEXTROSE 2-3 GM-% IV SOLR
2.0000 g | Freq: Four times a day (QID) | INTRAVENOUS | Status: AC
  Administered 2012-12-02 – 2012-12-03 (×3): 2 g via INTRAVENOUS
  Filled 2012-12-02 (×3): qty 50

## 2012-12-02 MED ORDER — MUPIROCIN 2 % EX OINT
TOPICAL_OINTMENT | Freq: Two times a day (BID) | CUTANEOUS | Status: DC
  Administered 2012-12-02: 11:00:00 via NASAL
  Filled 2012-12-02: qty 22

## 2012-12-02 MED ORDER — ONDANSETRON HCL 4 MG/2ML IJ SOLN: 4.0000 mg | Freq: Four times a day (QID) | INTRAMUSCULAR | Status: DC | PRN

## 2012-12-02 MED ORDER — MIDAZOLAM HCL 5 MG/5ML IJ SOLN
INTRAMUSCULAR | Status: AC
  Filled 2012-12-02: qty 5

## 2012-12-02 MED ORDER — ACETAMINOPHEN 325 MG PO TABS
325.0000 mg | ORAL_TABLET | ORAL | Status: DC | PRN
  Administered 2012-12-02 – 2012-12-03 (×2): 650 mg via ORAL
  Filled 2012-12-02 (×3): qty 2

## 2012-12-02 NOTE — H&P (Signed)
  Reason for Consult:Bradycardia with a h/o syncope  Referring Physician: Dr. Sinda Du is an 78 y.o. female.  HPI: Mrs. Heffelfinger is a 78 yo woman with a h/o syncope who was seen in the ED several days ago after experiencing a syncope episode while shopping. She was placed with a cardiac monitor and was found to have a 5 second pause on cardiac monitoring and instructed to go to the ED. She denies chest pain, sob, or edema. She has had no additional syncope spells. She does note abdominal symptoms which are transient and associated with her spells. She notes that for the past 5 months she will have dizzy episodes which resolve suddenly and last only a few seconds but which are associated with abdominal discomfort. No palpitations  PMH:  Past Medical History   Diagnosis  Date   .  Hyperlipidemia    .  Thyroid disease    .  AV node arrhythmia    .  Arthritis    .  Asthma    .  Depression    .  Colon polyps    .  Environmental allergies     PSHX:  Past Surgical History   Procedure  Laterality  Date   .  Knee surgery     .  Salpingoophorectomy       right   .  Appendectomy     .  Colonoscopy   2007    FAMHX:  Family History   Problem  Relation  Age of Onset   .  Colon cancer  Father    .  Colon polyps  Brother    .  Diabetes  Brother      maternal aunt    Social History: reports that she has never smoked. She has never used smokeless tobacco. She reports that she does not drink alcohol or use illicit drugs.  Allergies:  Allergies   Allergen  Reactions   .  Sulfa Antibiotics  Diarrhea and Rash    Medications: reviewed  No results found.  ROS  As stated in the HPI and negative for all other systems.  Physical Exam  Vitals:Blood pressure 115/71, pulse 71, temperature 98.4 F (36.9 C), temperature source Oral, resp. rate 15, SpO2 99.00%.  Well appearing 78 yo woman, NAD  HEENT: Unremarkable  Neck: No JVD, no thyromegally  Back: No CVA tenderness  Lungs: Clear with  no wheezes, rales, or rhonchi  HEART: Regular rate rhythm, no murmurs, no rubs, no clicks  Abd: Flat, positive bowel sounds, no organomegally, no rebound, no guarding  Ext: 2 plus pulses, no edema, no cyanosis, no clubbing  Skin: No rashes no nodules  Neuro: CN II through XII intact, motor grossly intact  Labs - reviewed  Assessment/Plan:  1. Syncope  2. Documented nocturnal bradycardia with a narrow QRS complex.  Rec: I have discussed the patients problems. She has syncope almost certainly due to transient and severe AV block. She is not on any av nodal blocking drugs. She has a class 1 indication for a PPM. I have discussed the risks/benefits/goals/indications for PPM. She wishes to proceed.  Leonia Reeves.D.

## 2012-12-02 NOTE — Progress Notes (Signed)
Patient's pacemaker registration book given to her husband.  [name]

## 2012-12-02 NOTE — Op Note (Signed)
DDD PPM insertion via the left cephalic vein without immediate complication. [id number].

## 2012-12-02 NOTE — Telephone Encounter (Signed)
Left message with results

## 2012-12-02 NOTE — Telephone Encounter (Signed)
Message copied by Burnell Blanks on Fri Dec 02, 2012  8:19 PM ------      Message from: Cassell Clement      Created: Wed Nov 30, 2012  9:37 PM       Echo okay , please report. ------

## 2012-12-03 ENCOUNTER — Ambulatory Visit (HOSPITAL_COMMUNITY): Payer: Medicare Other

## 2012-12-03 ENCOUNTER — Encounter (HOSPITAL_COMMUNITY): Payer: Self-pay | Admitting: Internal Medicine

## 2012-12-03 DIAGNOSIS — R55 Syncope and collapse: Secondary | ICD-10-CM

## 2012-12-03 DIAGNOSIS — I442 Atrioventricular block, complete: Secondary | ICD-10-CM | POA: Diagnosis present

## 2012-12-03 NOTE — Discharge Summary (Signed)
Patient ID: [name],  MRN: [id number], DOB/AGE: 1934/12/06 78 y.o.  Admit date: 12/02/2012 Discharge date: 12/03/2012  Primary Care Provider: Margaree Mackintosh Primary Cardiologist: Wylene Simmer, MD/ G. Ladona Ridgel, MD (EP)  Discharge Diagnoses Principal Problem:   Complete heart block -intermittent  **s/p SJM Accent DR RF dual chamber PPM this admission. Active Problems:   Syncope   Allergies Allergies  Allergen Reactions  . Sulfa Antibiotics Diarrhea and Rash   Procedures   Permanent Pacemaker Placement 6.13.2014  St. Jude Accent DR RF dual-chamber pacemaker  _____________  History of Present Illness  78 y/o female with a h/o syncope.  She was recently seen in the ED after experiencing a syncopal episode while shopping and was set up for outpatient monitoring.  Admission and pacemaker placement were recommended however patient wished to go home.  She was therefor set up to where a monitor as an outpatient and this subsequently showed evidence of intermittent complete heart block and a 5 second pause.  She was notified and arrangements were made to permanent pacemaker placement.  Hospital Course  Patient presented to the [facility] EP lab on 12/02/2012.  She underwent successful placement of a St. Jude Accent DR RF dual-chamber permanent pacemaker.  She tolerated this procedure well and post procedure chest x-ray had shown no evidence of complication or pneumothorax. She will be discharged home today in good condition with followup arranged in our device clinic within the next 10 days.  Discharge Vitals Blood pressure 115/60, pulse 65, temperature 98.6 F (37 C), temperature source Oral, resp. rate 18, height 5' 7.75" (1.721 m), weight 150 lb (68.04 kg), SpO2 95.00%.  Filed Weights   12/02/12 1136  Weight: 150 lb (68.04 kg)   Labs  None  Disposition  Pt is being discharged home today in good condition.  Follow-up Plans & Appointments      Follow-up Information   Follow up  with [facility] On 12/12/2012. (At 3:00 PM for wound check)    Contact information:   [address] [place] [address] [phone]      Follow up with Lewayne Bunting, MD On 03/09/2013. (At 11:00 AM)    Contact information:   [address] [place] [address] [phone]     Discharge Medications    Medication List    TAKE these medications       ACIDOPHOLUS PO  Take 1 tablet by mouth daily.     fexofenadine 30 MG disintegrating tablet  Commonly known as:  ALLEGRA ODT  Take 30 mg by mouth daily as needed. For allergy symptoms     guaiFENesin 600 MG 12 hr tablet  Commonly known as:  MUCINEX  Take 600 mg by mouth daily as needed for congestion. For chest cogestion     methylcellulose 1 % ophthalmic solution  Commonly known as:  ARTIFICIAL TEARS  Place 1 drop into both eyes as needed (eye irritation).     multivitamin with minerals tablet  Take 1 tablet by mouth daily.     VITAMIN D-3 PO  Take 1 capsule by mouth daily.       Outstanding Labs/Studies  None  Duration of Discharge Encounter   Greater than 30 minutes including physician time.  Signed, Nicolasa Ducking NP 12/03/2012, 11:01 AM

## 2012-12-03 NOTE — Progress Notes (Signed)
  Patient Name: [name]      SUBJECTIVE: s/p pacign for intermittent CHB   Past Medical History  Diagnosis Date  . Hyperlipidemia   . Thyroid disease   . Arthritis   . Asthma   . Depression   . Colon polyps   . Environmental allergies   . Pacemaker -st Judes 12/02/2012       . Complete heart block -intermittent     PHYSICAL EXAM Filed Vitals:   12/02/12 1645 12/02/12 1745 12/02/12 2100 12/03/12 0500  BP: 114/48 114/56 124/60 115/60  Pulse: 60 62 79 65  Temp:   97.5 F (36.4 C) 98.6 F (37 C)  TempSrc:      Resp:   18 18  Height:      Weight:      SpO2:   99% 95%    Well developed and nourished in no acute distress HENT normal Neck supple  Device pocket   without hematoma or erythema t Clear Regular rate and rhythm, no murmurs or gallops Abd-soft with active BS No Clubbing cyanosis edema Skin-warm and dry A & Oriented  Grossly normal sensory and motor function  TELEMETRY: Reviewed telemetry pt in nsr w intermittent P-synchronous/ AV  pacing :   No intake or output data in the 24 hours ending 12/03/12 0907  LABS: Basic Metabolic Panel:  Recent Labs Lab 11/26/12 0920  NA 138  K 3.9  CL 104  CO2 27  GLUCOSE 87  BUN 22  CREATININE 0.81  CALCIUM 9.0   Cardiac Enzymes: No results found for this basename: CKTOTAL, CKMB, CKMBINDEX, TROPONINI,  in the last 72 hours CBC:  Recent Labs Lab 11/26/12 0920  WBC 4.6  NEUTROABS 2.9  HGB 13.2  HCT 38.3  MCV 90.8  PLT 170    Device Interrogation:  Stable parameters one episode of nonsustained atrail fib   ASSESSMENT AND PLAN:  Active Problems:   * No active hospital problems. *  Discharge today post pacer implant for intermittent complete heart block insturctions given Wound check in 10-14 days   Signed, Sherryl Manges MD  12/03/2012

## 2012-12-03 NOTE — Op Note (Signed)
[name], [name]                   ACCOUNT NO.:  [id number]  MEDICAL RECORD NO.:  [id number]  LOCATION:  [id number]                        FACILITY:  [facility]  PHYSICIAN:  Doylene Canning. Ladona Ridgel, MD    DATE OF BIRTH:  [birth year]  DATE OF PROCEDURE:  12/02/2012 DATE OF DISCHARGE:                              OPERATIVE REPORT   PROCEDURE PERFORMED:  Insertion of a dual-chamber pacemaker.  INDICATION:  Symptomatic bradycardia due to sinus node dysfunction and intermittent complete heart block.  INTRODUCTION:  The patient is a 78 year old woman who has had recurrent syncopal episodes, and cardiac monitoring subsequently demonstrated post- termination pauses from atrial fibrillation of over 5 seconds.  These were associated with complete heart block.  The patient is now referred for insertion of a dual-chamber pacemaker.  Of note, there was no reversible causes in this patient.  PROCEDURE:  After informed consent was obtained, the patient was taken to the diagnostic EP lab in a fasting state.  After usual preparation and draping, intravenous fentanyl and midazolam was given for sedation. 30 mL of lidocaine was infiltrated into the left infraclavicular region. A 5-cm incision was carried out over this region.  Electrocautery was utilized to dissect down to the fascial plane.  The left cephalic vein was subsequently dissected free and cannulated and the St. Jude model 2088T 52 cm active fixation pacing lead, serial number [id number] was advanced to the right ventricle and the St. Jude model 2088T 46 cm active fixation pacing lead, serial number [id number] being advanced to the right atrium.  Mapping was then carried out in the right ventricle. At the final site, the R-waves were 10 mV, the pacing impedance 1100 ohms, and the threshold was 1.7 V at 0.4 milliseconds initially, but came down after a period of time.  Next, the atrial lead was advanced into the anterolateral portion of the right atrium  where P-waves measured 2.5 mV and with the lead actively fixed, the pacing threshold was less than 1 V at 0.4 milliseconds.  The atrial pacing impedance was 400 ohms.  With both the atrial and ventricular leads in satisfactory position, they were secured to the subpectoral fascia with a silk suture.  The pocket was irrigated with antibiotic irrigation and the St. Jude Accent DR RF dual-chamber pacemaker was connected to the atrial and ventricular leads and placed back in the subcutaneous pocket where it was secured with silk suture.  Additional irrigation was utilized to irrigate the pocket and the incision was closed with 2-0 and 3-0 Vicryl. Benzoin and Steri-Strips were painted on the skin, pressure dressing was applied, and the patient was returned to her room in satisfactory condition.  COMPLICATIONS:  There were no immediate procedure complications.  RESULTS:  This demonstrates successful implantation of a St. Jude dual- chamber pacemaker in a patient with symptomatic bradycardia due to sinus node dysfunction and intermittent complete heart block.     Doylene Canning. Ladona Ridgel, MD     GWT/MEDQ  D:  12/02/2012  T:  12/03/2012  Job:  [id number]  cc:   Cassell Clement, M.D.

## 2012-12-07 ENCOUNTER — Telehealth: Payer: Self-pay | Admitting: Internal Medicine

## 2012-12-07 NOTE — Telephone Encounter (Signed)
New Prob    Pt had a device implanted 6/13 and has some concerns regarding the site. States the skin is pinkish and has been getting lighter. Pt would like to speak to nurse.

## 2012-12-08 NOTE — Telephone Encounter (Signed)
Instructions given for wound care.  Patient was concerned because site is "pink".  She denies any swelling, discharge, fever or chills.  We will see her for a wound check on the 23rd as scheduled.

## 2012-12-12 ENCOUNTER — Encounter: Payer: Self-pay | Admitting: Internal Medicine

## 2012-12-12 ENCOUNTER — Ambulatory Visit (INDEPENDENT_AMBULATORY_CARE_PROVIDER_SITE_OTHER): Payer: Medicare Other | Admitting: *Deleted

## 2012-12-12 DIAGNOSIS — I442 Atrioventricular block, complete: Secondary | ICD-10-CM

## 2012-12-12 DIAGNOSIS — I440 Atrioventricular block, first degree: Secondary | ICD-10-CM

## 2012-12-12 NOTE — Progress Notes (Signed)
Pt seen in device clinic for follow up of recently implanted pacemaker.  Wound well healed.  No redness, swelling, or edema.  Steri-strips removed today.   Device interrogated and found to be functioning normally.  No changes made today. See PaceArt for full details.  Pt c/o of pocket tenderness and desire to wear arm sling-- I advised her to continue to move her arm to assist the healing process.  Pt to follow up with Dr. Ladona Ridgel 03/09/13.   [name] 12/12/2012 3:43 PM

## 2012-12-13 LAB — PACEMAKER DEVICE OBSERVATION
AL IMPEDENCE PM: 400 Ohm
AL THRESHOLD: 0.5 V
BATTERY VOLTAGE: 3.04 V
RV LEAD AMPLITUDE: 12 mv
RV LEAD IMPEDENCE PM: 540 Ohm

## 2012-12-20 ENCOUNTER — Other Ambulatory Visit: Payer: Medicare Other | Admitting: Internal Medicine

## 2012-12-20 DIAGNOSIS — Z0000 Encounter for general adult medical examination without abnormal findings: Secondary | ICD-10-CM

## 2012-12-20 DIAGNOSIS — Z1329 Encounter for screening for other suspected endocrine disorder: Secondary | ICD-10-CM

## 2012-12-20 DIAGNOSIS — Z130 Encounter for screening for diseases of the blood and blood-forming organs and certain disorders involving the immune mechanism: Secondary | ICD-10-CM

## 2012-12-20 DIAGNOSIS — Z13220 Encounter for screening for lipoid disorders: Secondary | ICD-10-CM

## 2012-12-20 LAB — CBC WITH DIFFERENTIAL/PLATELET
Basophils Absolute: 0 10*3/uL (ref 0.0–0.1)
Basophils Relative: 0 % (ref 0–1)
Eosinophils Relative: 3 % (ref 0–5)
HCT: 38.4 % (ref 36.0–46.0)
Hemoglobin: 12.9 g/dL (ref 12.0–15.0)
Lymphocytes Relative: 25 % (ref 12–46)
MCHC: 33.6 g/dL (ref 30.0–36.0)
MCV: 90.6 fL (ref 78.0–100.0)
Monocytes Absolute: 0.7 10*3/uL (ref 0.1–1.0)
Monocytes Relative: 11 % (ref 3–12)
RDW: 13.8 % (ref 11.5–15.5)

## 2012-12-20 LAB — LIPID PANEL
Cholesterol: 213 mg/dL — ABNORMAL HIGH (ref 0–200)
HDL: 69 mg/dL (ref >39)
Total CHOL/HDL Ratio: 3.1 Ratio
Triglycerides: 73 mg/dL (ref <150)

## 2012-12-20 LAB — COMPREHENSIVE METABOLIC PANEL
AST: 25 U/L (ref 0–37)
Albumin: 3.9 g/dL (ref 3.5–5.2)
BUN: 20 mg/dL (ref 6–23)
Calcium: 9.2 mg/dL (ref 8.4–10.5)
Chloride: 105 mEq/L (ref 96–112)
Creat: 0.94 mg/dL (ref 0.50–1.10)
Glucose, Bld: 79 mg/dL (ref 70–99)

## 2012-12-20 LAB — TSH: TSH: 6.64 u[IU]/mL — ABNORMAL HIGH (ref 0.350–4.500)

## 2012-12-22 ENCOUNTER — Encounter: Payer: Self-pay | Admitting: Internal Medicine

## 2012-12-22 ENCOUNTER — Ambulatory Visit (INDEPENDENT_AMBULATORY_CARE_PROVIDER_SITE_OTHER): Payer: Medicare Other | Admitting: Internal Medicine

## 2012-12-22 VITALS — BP 120/68 | HR 76 | Temp 99.1°F | Ht 67.75 in | Wt 146.5 lb

## 2012-12-22 DIAGNOSIS — R319 Hematuria, unspecified: Secondary | ICD-10-CM

## 2012-12-22 DIAGNOSIS — R946 Abnormal results of thyroid function studies: Secondary | ICD-10-CM

## 2012-12-22 DIAGNOSIS — R413 Other amnesia: Secondary | ICD-10-CM

## 2012-12-22 DIAGNOSIS — Z0000 Encounter for general adult medical examination without abnormal findings: Secondary | ICD-10-CM

## 2012-12-22 DIAGNOSIS — R7989 Other specified abnormal findings of blood chemistry: Secondary | ICD-10-CM

## 2012-12-22 DIAGNOSIS — E785 Hyperlipidemia, unspecified: Secondary | ICD-10-CM

## 2012-12-22 DIAGNOSIS — Z8679 Personal history of other diseases of the circulatory system: Secondary | ICD-10-CM

## 2012-12-22 DIAGNOSIS — Z950 Presence of cardiac pacemaker: Secondary | ICD-10-CM

## 2012-12-22 LAB — POCT URINALYSIS DIPSTICK
Bilirubin, UA: NEGATIVE
Glucose, UA: NEGATIVE
Ketones, UA: NEGATIVE
Spec Grav, UA: >=1.03
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
## 2012-12-23 LAB — URINALYSIS, ROUTINE W REFLEX MICROSCOPIC
Ketones, ur: NEGATIVE mg/dL
Nitrite: NEGATIVE
Specific Gravity, Urine: 1.023 (ref 1.005–1.030)
Urobilinogen, UA: 0.2 mg/dL (ref 0.0–1.0)
pH: 5.5 (ref 5.0–8.0)

## 2012-12-23 LAB — URINALYSIS, MICROSCOPIC ONLY
Bacteria, UA: NONE SEEN
Casts: NONE SEEN

## 2012-12-24 LAB — URINE CULTURE
Colony Count: NO GROWTH
Organism ID, Bacteria: NO GROWTH

## 2013-01-23 ENCOUNTER — Encounter: Payer: Self-pay | Admitting: Internal Medicine

## 2013-01-23 ENCOUNTER — Telehealth: Payer: Self-pay | Admitting: Internal Medicine

## 2013-01-23 ENCOUNTER — Ambulatory Visit (INDEPENDENT_AMBULATORY_CARE_PROVIDER_SITE_OTHER): Payer: Medicare Other | Admitting: *Deleted

## 2013-01-23 DIAGNOSIS — I495 Sick sinus syndrome: Secondary | ICD-10-CM

## 2013-01-23 LAB — PACEMAKER DEVICE OBSERVATION
AL IMPEDENCE PM: 462.5 Ohm
AL THRESHOLD: 0.75 V
ATRIAL PACING PM: 20
BAMS-0001: 150 {beats}/min
BAMS-0003: 70 {beats}/min
BATTERY VOLTAGE: 3.008 V
RV LEAD AMPLITUDE: 12 mv

## 2013-01-23 NOTE — Progress Notes (Signed)
Pacer check in clinic due to pt not feeling well since last ppm check. Pt has weird sensations in her in abdomen. Pt can not describe pain. Weird sensation from abdomen and going into both arms. ROV 03-09-13 @ 1100am.

## 2013-01-23 NOTE — Telephone Encounter (Signed)
No need for visit. [name] will work on this this week.

## 2013-01-27 NOTE — Telephone Encounter (Signed)
Records faxed to [facility].

## 2013-01-30 ENCOUNTER — Telehealth: Payer: Self-pay | Admitting: Internal Medicine

## 2013-01-30 DIAGNOSIS — Z13820 Encounter for screening for osteoporosis: Secondary | ICD-10-CM

## 2013-01-30 DIAGNOSIS — Z1239 Encounter for other screening for malignant neoplasm of breast: Secondary | ICD-10-CM

## 2013-01-30 NOTE — Telephone Encounter (Signed)
Orders in Epic for screening mammogram and bonedensity study at [facility].

## 2013-01-30 NOTE — Telephone Encounter (Signed)
Can you do this by computer?. I was not aware they did bone density at [facility] hospital.

## 2013-02-14 ENCOUNTER — Ambulatory Visit (HOSPITAL_COMMUNITY): Payer: Medicare Other

## 2013-03-09 ENCOUNTER — Encounter: Payer: Self-pay | Admitting: Internal Medicine

## 2013-03-09 ENCOUNTER — Ambulatory Visit (INDEPENDENT_AMBULATORY_CARE_PROVIDER_SITE_OTHER): Payer: Medicare Other | Admitting: Internal Medicine

## 2013-03-09 VITALS — BP 127/67 | HR 65 | Ht 67.0 in | Wt 147.0 lb

## 2013-03-09 DIAGNOSIS — E785 Hyperlipidemia, unspecified: Secondary | ICD-10-CM

## 2013-03-09 DIAGNOSIS — Z950 Presence of cardiac pacemaker: Secondary | ICD-10-CM

## 2013-03-09 DIAGNOSIS — I440 Atrioventricular block, first degree: Secondary | ICD-10-CM

## 2013-03-09 LAB — PACEMAKER DEVICE OBSERVATION
AL AMPLITUDE: 4 mv
AL IMPEDENCE PM: 462.5 Ohm
AL THRESHOLD: 0.75 V
BATTERY VOLTAGE: 3.008 V
RV LEAD AMPLITUDE: 12 mv

## 2013-03-09 NOTE — Assessment & Plan Note (Signed)
She is instructed to maintain a low-sodium, low-fat diet. She will continue her current medical therapy.

## 2013-03-09 NOTE — Assessment & Plan Note (Signed)
Her St. Jude dual-chamber pacemaker is working normally. We'll plan to recheck in several months. 

## 2013-03-09 NOTE — Progress Notes (Signed)
HPI [name] returns today for followup. She is a very pleasant 78 year old woman with a history of symptomatic bradycardia, secondary to sinus node dysfunction, status post permanent pacemaker insertion. She also has a history of heart block. In the interim, she has been stable. She has undergone cataract surgery without complication. She denies chest pain or shortness of breath. No peripheral edema. Allergies  Allergen Reactions  . Sulfa Antibiotics Diarrhea and Rash     Current Outpatient Prescriptions  Medication Sig Dispense Refill  . Cholecalciferol (VITAMIN D-3 PO) Take 1 capsule by mouth daily.      . fexofenadine (ALLEGRA ODT) 30 MG disintegrating tablet Take 30 mg by mouth daily as needed. For allergy symptoms      . Lactobacillus (ACIDOPHOLUS PO) Take 1 tablet by mouth daily.       . methylcellulose (ARTIFICIAL TEARS) 1 % ophthalmic solution Place 1 drop into both eyes as needed (eye irritation).      . Multiple Vitamins-Minerals (MULTIVITAMIN WITH MINERALS) tablet Take 1 tablet by mouth daily.         No current facility-administered medications for this visit.     Past Medical History  Diagnosis Date  . Hyperlipidemia   . Thyroid disease   . Arthritis   . Asthma   . Depression   . Colon polyps   . Environmental allergies   . Complete heart block -intermittent     a. 11/2012 s/p SJM Accent DR RF dc ppm.    ROS:   All systems reviewed and negative except as noted in the HPI.   Past Surgical History  Procedure Laterality Date  . Knee surgery    . Salpingoophorectomy      right  . Appendectomy    . Colonoscopy  2007  . Insert / replace / remove pacemaker  12/02/2012     Dr Ladona Ridgel  . Hand surgery       Family History  Problem Relation Age of Onset  . Colon cancer Father   . Colon polyps Brother   . Diabetes Brother     maternal aunt     History   Social History  . Marital Status: Married    Spouse Name: N/A    Number of Children: 3  . Years of  Education: N/A   Occupational History  . elem. school teacher     retired   Social History Main Topics  . Smoking status: Never Smoker   . Smokeless tobacco: Never Used  . Alcohol Use: No     Comment: rare  . Drug Use: No  . Sexual Activity: Not on file   Other Topics Concern  . Not on file   Social History Narrative  . No narrative on file     BP 127/67  Pulse 65  Ht 5\' 7"  (1.702 m)  Wt 147 lb (66.679 kg)  BMI 23.02 kg/m2  Physical Exam:  Well appearing 78 year old woman, NAD HEENT: Unremarkable Neck:  6 cm JVD, no thyromegally Back:  No CVA tenderness Lungs:  Clear with no wheezes, rales, or rhonchi. HEART:  Regular rate rhythm, no murmurs, no rubs, no clicks Abd:  soft, positive bowel sounds, no organomegally, no rebound, no guarding Ext:  2 plus pulses, no edema, no cyanosis, no clubbing Skin:  No rashes no nodules Neuro:  CN II through XII intact, motor grossly intact   DEVICE  Normal device function.  See PaceArt for details.   Assess/Plan:

## 2013-03-09 NOTE — Patient Instructions (Addendum)
Remote monitoring is used to monitor your Pacemaker of ICD from home. This monitoring reduces the number of office visits required to check your device to one time per year. It allows [place] to keep an eye on the functioning of your device to ensure it is working properly. You are scheduled for a device check from home on 06/12/2013 Merlin. You may send your transmission at any time that day. If you have a wireless device, the transmission will be sent automatically. After your physician reviews your transmission, you will receive a postcard with your next transmission date.  Your physician wants you to follow-up in: 12 month with Dr. Court Joy will receive a reminder letter in the mail two months in advance. If you don't receive a letter, please call our office to schedule the follow-up appointment.  Your physician recommends that you continue on your current medications as directed. Please refer to the Current Medication list given to you today.

## 2013-03-13 ENCOUNTER — Encounter: Payer: Self-pay | Admitting: Internal Medicine

## 2013-03-21 ENCOUNTER — Encounter: Payer: Self-pay | Admitting: Neurology

## 2013-03-22 ENCOUNTER — Encounter: Payer: Self-pay | Admitting: Neurology

## 2013-03-22 ENCOUNTER — Ambulatory Visit (INDEPENDENT_AMBULATORY_CARE_PROVIDER_SITE_OTHER): Payer: Medicare Other | Admitting: Neurology

## 2013-03-22 VITALS — BP 108/56 | HR 65 | Temp 98.0°F | Ht 67.0 in | Wt 148.0 lb

## 2013-03-22 DIAGNOSIS — F09 Unspecified mental disorder due to known physiological condition: Secondary | ICD-10-CM

## 2013-03-22 DIAGNOSIS — F070 Personality change due to known physiological condition: Secondary | ICD-10-CM

## 2013-03-22 DIAGNOSIS — R413 Other amnesia: Secondary | ICD-10-CM

## 2013-03-22 DIAGNOSIS — F0789 Other personality and behavioral disorders due to known physiological condition: Secondary | ICD-10-CM

## 2013-03-22 HISTORY — DX: Other amnesia: R41.3

## 2013-03-22 MED ORDER — DONEPEZIL HCL 5 MG PO TABS: 5.0000 mg | ORAL_TABLET | Freq: Every day | ORAL | Status: DC

## 2013-03-22 NOTE — Progress Notes (Addendum)
[facility]  Provider:  Melvyn Novas, M D  Referring Provider: Margaree Mackintosh, MD Primary Care Physician:  Margaree Mackintosh, MD  Chief Complaint  Patient presents with  . Memory Loss    HPI:  [name] is a 78 y.o. female  Is seen here as a referral/ revisit  from Dr. Lenord Fellers for memory loss.   [name] is a 78 year old right-handed, Caucasian, married female patient of Dr. Lenord Fellers. She presents today for evaluation of memory loss. 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 The patient has a history of depression, hypothyroidism, possible pre-diabetes. Dr. Lenord Fellers is concerned about possible early dementia. The patient also states that she has a pacemaker has a history of a first degree a V. block. She'll has a history of dizziness and possible TIA and is an established patient with Dr. Pearlean Brownie at [facility] neurologic.  She had transient left-sided weakness when he saw her in 2011. In March 2011, she underwent an MRI of the brain ( ordering physician was Dr. Vickey Huger) with no evidence of tumor or infarct or bleed. Evident was chronic microvascular ischemia ,but  This was considered age related 66 Dr Pearlean Brownie interpreted) .  The evaluation for possible stroke or TIA in March 2011 followed a history of a sudden fall within the confines of her home. She had one from the livings living room to the kitchen and fell between the coffee table and the sofa. Apparently there was a second episode when she fell suddenly to the right. The second episode was associated with numbness of the left side. 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 A cardiac monitoring revealed AV block as the possible cause for her falls. She has seen Dr.  Sharrell Ku twice this year for syncope with a sick sinus syndrome ( but states she hadn't fainted " ).   Meanwhile the patient feels that her memory  has never been that much better  and that in all functions have been taking away from her- she never needed to keep the financial books.   She feels,  that she is performrng  at the same level as last year or 2 years ago.  Extensive laboratory results were already performed by her primary care physician and reviewed today, she has normal problems with sleep, she has no nocturnal hallucinations she reports normal light terrors or REM behavior disorder, she also had no recent falls. There is no parkinsonism, shuffling gait, urinary incontinence.    Review of Systems: Out of a complete 14 system review, the patient complains of only the following symptoms, and all other reviewed systems are negative. memory loss, confusion, disorientation,  Depression.   History   Social History  . Marital Status: Married    Spouse Name: Darcel Bayley    Number of Children: 3  . Years of Education: College   Occupational History  . elem. school teacher     retired   Social History Main Topics  . Smoking status: Never Smoker   . Smokeless tobacco: Never Used  . Alcohol Use: Yes     Comment: occas. which is seldom  . Drug Use: No  . Sexual Activity: Not on file   Other Topics Concern  . Not on file   Social History Narrative   Patient has one daughter, two adopted children(son,daughter).   Patient lives at home  with spouse.     Family History  Problem Relation Age of Onset  . Colon cancer Father   . Colon polyps Brother   . Diabetes Brother     maternal aunt  . Coronary artery disease Brother     Past Medical History  Diagnosis Date  . Hyperlipidemia   . Thyroid disease   . Arthritis   . Asthma   . Depression   . Colon polyps   . Environmental allergies   . Complete heart block -intermittent     a. 11/2012 s/p SJM Accent DR RF dc ppm.  . Osteoarthritis     hands    Past Surgical History  Procedure Laterality Date  . Knee surgery    . Salpingoophorectomy      right  .  Appendectomy  1965  . Colonoscopy  2007    adenomatous polyps  . Insert / replace / remove pacemaker  12/02/2012     Dr Ladona Ridgel  . Hand surgery    . Ovarian cystectomy  1965  . Tumor removed  1985    abdomen  . Cervical polypectomy  1996    Current Outpatient Prescriptions  Medication Sig Dispense Refill  . Cholecalciferol (VITAMIN D-3 PO) Take 1 capsule by mouth daily.      . Lactobacillus (ACIDOPHOLUS PO) Take 1 tablet by mouth daily.       . methylcellulose (ARTIFICIAL TEARS) 1 % ophthalmic solution Place 1 drop into both eyes as needed (eye irritation).      . Multiple Vitamins-Minerals (MULTIVITAMIN WITH MINERALS) tablet Take 1 tablet by mouth daily.         No current facility-administered medications for this visit.    Allergies as of 03/22/2013 - Review Complete 03/22/2013  Allergen Reaction Noted  . Sulfa antibiotics Diarrhea and Rash 01/05/2011    Vitals: BP 108/56  Pulse 65  Temp(Src) 98 F (36.7 C) (Oral)  Ht 5\' 7"  (1.702 m)  Wt 148 lb (67.132 kg)  BMI 23.17 kg/m2 Last Weight:  Wt Readings from Last 1 Encounters:  03/22/13 148 lb (67.132 kg)   Last Height:   Ht Readings from Last 1 Encounters:  03/22/13 5\' 7"  (1.702 m)    Physical exam:  General: The patient is awake, alert and appears not in acute distress. The patient is well groomed. She is not accompanied by another family member today, she reports that her husband has handled her financial affairs of the family, he has not noticed that she misplaces items more frequently than in the past, she states she has not gotten lost, her husband is mostly the driver. Head: Normocephalic, atraumatic. Neck is supple. Cardiovascular:  Regular rate and rhythm 58 bpm ,  No fainting or dizziness within the last 6 month, no falls.  Respiratory: Lungs are clear to auscultation. Skin:  Without evidence of edema, or rash Trunk: BMI is  normal .  Neurologic exam : The patient is awake and alert, oriented to place and  time.  Memory subjective  described as unchanged " you would have to ask my husband "  -. There is a normal attention span & concentration ability.   The patient was tested by a Mini-Mental status exam and scored 27/30 points animal fluency was 9 points only she was able to spell backwards she lost 2 points by not being able to recall 2 of 3 words. She also missed Wednesday and exchanged for Tuesday as the day of the test was followed by an  am on CA, a [place]ral cognitive assessment test the patient had problems this trail making, placing the hands on the clock face correctly, and she missed 2/5  recall words.  She also was not able to subtract serially 7. Speech is trailing off, mild dysarthria, dysphonia not aphasia. Mood and affect are timid, demure.   Cranial nerves: Pupils are equal and briskly reactive to light. Funduscopic exam without   evidence of pallor or edema.   Status post cataract surgery on the right eye  Recently. Extraocular movements  in vertical and horizontal planes intact and without nystagmus.  Visual fields by finger perimetry are intact. Hearing to finger rub intact.  Facial sensation intact to fine touch. Facial motor strength is symmetric and tongue and uvula move midline.  Motor exam:    Normal tone and normal muscle bulk and symmetric normal strength in all extremities. The patient moves her feet constantly while being interviewed.   Sensory:  Fine touch, pinprick and vibration were tested in all extremities. Proprioception is  normal.  Coordination: Rapid alternating movements in the fingers/hands is tested and normal. Finger-to-nose maneuver tested and normal without evidence of ataxia, dysmetria or tremor.  Gait and station: Patient walks without assistive device . Strength within normal limits. Stance is stable and normal. Tandem gait is  unfragmented.  The patient's weight lately when she performs a tandem gait, but her Romberg test is negative Deep tendon  reflexes: in the  upper and lower extremities are symmetric and intact. Babinski maneuver response is  Downgoing. This patient has collapsed arches in both feet.  Hammertoes on the left.    Assessment:  After physical and neurologic examination, review of laboratory studies, imaging, neurophysiology testing and pre-existing records, assessment is that of mild cognitive impairment, based on a MMSE of 27-30 points, Confirmed by the more detailed MOCA at 20-30 points- this can be early dementia of alzheimer's type.  I will need to repeat a CT image , to rule out recent abnormalities , not evident 3.5 years ago.  I will need to obtain dementia panel labs and reviewed the studies from July 2014,  Needs CRP and sed rate, B12 and RPR.  Rv with Darrol Angel , NP in 2 month . Patient advised to bring a family member.   Plan:  Treatment plan and additional workup : the patient has a pacemaker and should be  safe to use Aricept , starting at 5 mg daily for 30 days than increase to 10 mg daily.

## 2013-03-22 NOTE — Patient Instructions (Signed)
Dementia Dementia is a word that is used to describe problems with the brain and how it works. People with dementia have memory loss. They may also have problems with thinking, speaking, or solving problems. It can affect how they act around people, how they do their job, their mood, and their personality. These changes may not show up for a long time. Family or friends may not notice problems in the early part of this disease. HOME CARE The following tips are for the person living with, or caring for, the person with dementia. Make the home safe.  Remove locks on bathroom doors.  Use childproof locks on cabinets where alcohol, cleaning supplies, or chemicals are stored.  Put outlet covers in electrical outlets.  Put in childproof locks to keep doors and windows safe.  Remove stove knobs, or put in safety knobs that shut off on their own.  Lower the temperature on water heaters.  Label medicines. Lock them in a safe place.  Keep knives, lighters, matches, power tools, and guns out of reach or in a safe place.  Remove objects that might break or can hurt the person.  Make sure lighting is good inside and outside.  Put in grab bars if needed.  Use a device that detects falls or other needs for help. Lessen confusion.  Keep familiar objects and people around.  Use night lights or low lit (dim) lights at night.  Label objects or areas.  Use reminders, notes, or directions for daily activities or tasks.  Keep a simple routine that is the same for waking, meals, bathing, dressing, and bedtime.  Create a calm and quiet home.  Put up clocks and calendars.  Keep emergency numbers and the home address near all phones.  Help show the different times of day. Open the curtains during the day to let light in. Speak clearly and directly.  Choose simple words and short sentences.  Use a gentle, calm voice.  Do not interrupt.  If the person has a hard time finding a word to  use, give them the word or thought.  Ask 1 question at a time. Give enough time for the person to answer. Repeat the question if the person does not answer. Do things that lessen restlessness.  Provide a comfortable bed.  Have the same bedtime routine every night.  Have a regular walking and activity schedule.  Lessen naps during the day.  Do not let the person drink a lot of caffeine.  Go to events that are not overwhelming. Eat well and drink fluids.  Lessen distractions during meal times and snacks.  Avoid foods that are too hot or too cold.  Watch how the person chews and swallows. This is to make sure they do not choke. Other  Keep all vision, hearing, dental, and medical visits with the doctor.  Only give medicines as told by the doctor.  Watch the person's driving ability. Do not let the person drive if he or she cannot drive safely.  Use a program that helps find a person if they become missing. You may need to register with this program. GET HELP RIGHT AWAY IF:   A fever of 102 F (38.9 C) develops.  Confusion develops or gets worse.  Sleepiness develops or gets worse.  Staying awake is hard to do.  New behavior problems start like mood swings, aggression, and seeing things that are not there.  Problems with balance, speech, or falling develop.  Problems swallowing develop.  Any   problems of another sickness develop. MAKE SURE YOU:  Understand these instructions.  Will watch his or her condition.  Will get help right away if he or she is not doing well or gets worse. Document Released: 05/21/2008 Document Revised: 08/31/2011 Document Reviewed: 11/03/2010 Exit[employer] Patient Information 2014 [employer]. Driving and Equipment Restrictions Some medical problems make it dangerous to drive, ride a bike, or use machines. Some of these problems are:  A hard blow to the head (concussion).  Passing out (fainting).  Twitching and shaking  (seizures).  Low blood sugar.  Taking medicine to help you relax (sedatives).  Taking pain medicines.  Wearing an eye patch.  Wearing splints. This can make it hard to use parts of your body that you need to drive safely. HOME CARE   Do not drive until your doctor says it is okay.  Do not use machines until your doctor says it is okay. You may need a form signed by your doctor (medical release) before you can drive again. You may also need this form before you do other tasks where you need to be fully alert. MAKE SURE YOU:  Understand these instructions.  Will watch your condition.  Will get help right away if you are not doing well or get worse. Document Released: 07/16/2004 Document Revised: 08/31/2011 Document Reviewed: 10/16/2009 Exit[employer] Patient Information 2014 [employer].  

## 2013-03-28 LAB — METHYLMALONIC ACID, SERUM: Methylmalonic Acid: 136 nmol/L (ref 0–378)

## 2013-03-28 LAB — RPR: RPR: NONREACTIVE

## 2013-03-31 NOTE — Progress Notes (Signed)
Quick Note:  Call pt about lab results, pt verbalized understanding. ______

## 2013-04-12 ENCOUNTER — Ambulatory Visit
Admission: RE | Admit: 2013-04-12 | Discharge: 2013-04-12 | Disposition: A | Discharge status: Home / Self Care | Payer: Medicare Other | Source: Ambulatory Visit | Attending: Neurology | Admitting: Neurology

## 2013-04-12 DIAGNOSIS — F09 Unspecified mental disorder due to known physiological condition: Secondary | ICD-10-CM

## 2013-04-12 DIAGNOSIS — R413 Other amnesia: Secondary | ICD-10-CM

## 2013-04-12 DIAGNOSIS — F0789 Other personality and behavioral disorders due to known physiological condition: Secondary | ICD-10-CM

## 2013-04-18 ENCOUNTER — Other Ambulatory Visit: Payer: Self-pay | Admitting: Obstetrics & Gynecology

## 2013-04-18 DIAGNOSIS — R102 Pelvic and perineal pain: Secondary | ICD-10-CM

## 2013-04-19 ENCOUNTER — Ambulatory Visit: Payer: Medicare Other | Admitting: Cardiology

## 2013-04-19 ENCOUNTER — Ambulatory Visit
Admission: RE | Admit: 2013-04-19 | Discharge: 2013-04-19 | Disposition: A | Discharge status: Home / Self Care | Payer: 59 | Source: Ambulatory Visit | Attending: Obstetrics & Gynecology | Admitting: Obstetrics & Gynecology

## 2013-04-19 DIAGNOSIS — R102 Pelvic and perineal pain: Secondary | ICD-10-CM

## 2013-04-19 MED ORDER — IOHEXOL 300 MG/ML  SOLN
100.0000 mL | Freq: Once | INTRAMUSCULAR | Status: AC | PRN
  Administered 2013-04-19: 100 mL via INTRAVENOUS

## 2013-04-20 NOTE — Progress Notes (Signed)
Quick Note:  I called and spoke to pt and wanted to [facility] for husband that CT scan of head showed no change from scan done back in 06-2009. She would relay the message. ______

## 2013-04-21 ENCOUNTER — Telehealth: Payer: Self-pay | Admitting: Cardiology

## 2013-04-21 ENCOUNTER — Encounter (INDEPENDENT_AMBULATORY_CARE_PROVIDER_SITE_OTHER): Payer: Self-pay

## 2013-04-21 ENCOUNTER — Encounter: Payer: Self-pay | Admitting: Cardiology

## 2013-04-21 ENCOUNTER — Ambulatory Visit (INDEPENDENT_AMBULATORY_CARE_PROVIDER_SITE_OTHER): Payer: Medicare Other | Admitting: Cardiology

## 2013-04-21 VITALS — BP 112/79 | HR 80 | Ht 67.0 in | Wt 147.0 lb

## 2013-04-21 DIAGNOSIS — Z950 Presence of cardiac pacemaker: Secondary | ICD-10-CM

## 2013-04-21 DIAGNOSIS — R413 Other amnesia: Secondary | ICD-10-CM

## 2013-04-21 DIAGNOSIS — I442 Atrioventricular block, complete: Secondary | ICD-10-CM

## 2013-04-21 DIAGNOSIS — R55 Syncope and collapse: Secondary | ICD-10-CM

## 2013-04-21 DIAGNOSIS — E785 Hyperlipidemia, unspecified: Secondary | ICD-10-CM

## 2013-04-21 LAB — BASIC METABOLIC PANEL
CO2: 30 mEq/L (ref 19–32)
Chloride: 102 mEq/L (ref 96–112)
GFR: 73.01 mL/min (ref >60.00)
Glucose, Bld: 85 mg/dL (ref 70–99)
Potassium: 4.1 mEq/L (ref 3.5–5.1)
Sodium: 138 mEq/L (ref 135–145)

## 2013-04-21 LAB — HEPATIC FUNCTION PANEL
Albumin: 4 g/dL (ref 3.5–5.2)
Alkaline Phosphatase: 63 U/L (ref 39–117)
Total Bilirubin: 0.7 mg/dL (ref 0.3–1.2)
Total Protein: 6.8 g/dL (ref 6.0–8.3)

## 2013-04-21 LAB — LIPID PANEL
Cholesterol: 228 mg/dL — ABNORMAL HIGH (ref 0–200)
HDL: 88.5 mg/dL (ref >39.00)
Total CHOL/HDL Ratio: 3
Triglycerides: 75 mg/dL (ref 0.0–149.0)
VLDL: 15 mg/dL (ref 0.0–40.0)

## 2013-04-21 NOTE — Assessment & Plan Note (Addendum)
The patient feels that her memory retention issues have been stable and she is on low-dose Aricept which has been added since her last visit here.  She is tolerating without side effects.

## 2013-04-21 NOTE — Patient Instructions (Signed)
Will obtain labs today and call you with the results (lp/bmet/hfp)  Your physician recommends that you continue on your current medications as directed. Please refer to the Current Medication list given to you today.  Your physician wants you to follow-up in: 6 month ov You will receive a reminder letter in the mail two months in advance. If you don't receive a letter, please call our office to schedule the follow-up appointment.    

## 2013-04-21 NOTE — Telephone Encounter (Signed)
New message  Patient has questions about test, please call patients husband

## 2013-04-21 NOTE — Telephone Encounter (Signed)
Left message to call back  

## 2013-04-21 NOTE — Assessment & Plan Note (Signed)
Since placement of her dual-chamber pacemaker on 11/2012 she has not had any recurrent dizziness or syncope

## 2013-04-21 NOTE — Progress Notes (Signed)
[name] Date of Birth:  05-Jun-1935 [address] [place], [place]  [address] [phone]         Fax   [phone]  History of Present Illness: This 78 year old woman is seen for a scheduled followup office visit.  She has a history of intermittent high-grade AV block and has a functioning dual-chamber pacemaker.  She is followed by Dr. Ladona Ridgel.  She has a history of hypercholesterolemia.  She has a history of osteoarthritis.  She has also had some memory issues and is followed by Dr. Vickey Huger.  Since last visit she has had no new cardiac symptoms.  She has not had any dizziness or syncope.  No chest pain.  Her weight has been stable.  She has been trying to watch her diet.  Current Outpatient Prescriptions  Medication Sig Dispense Refill  . Cholecalciferol (VITAMIN D-3 PO) Take 1 capsule by mouth daily.      Marland Kitchen donepezil (ARICEPT) 5 MG tablet Take 1 tablet (5 mg total) by mouth at bedtime.  30 tablet  1  . Lactobacillus (ACIDOPHOLUS PO) Take 1 tablet by mouth daily.       . methylcellulose (ARTIFICIAL TEARS) 1 % ophthalmic solution Place 1 drop into both eyes as needed (eye irritation).      . Multiple Vitamins-Minerals (MULTIVITAMIN WITH MINERALS) tablet Take 1 tablet by mouth daily.         No current facility-administered medications for this visit.    Allergies  Allergen Reactions  . Sulfa Antibiotics Diarrhea and Rash    Patient Active Problem List   Diagnosis Date Noted  . Memory retention disorder 03/22/2013  . Pacemaker 03/09/2013  . Complete heart block -intermittent   . Syncope 11/18/2012  . Personal history of adenomatous colonic polyps 11/12/2011  . History of TIA (transient ischemic attack) 09/19/2011  . Hypothyroidism 01/13/2011  . Hyperlipidemia 01/13/2011  . First degree AV block 01/13/2011  . Gait disturbance 01/13/2011    History  Smoking status  . Never Smoker   Smokeless tobacco  . Never Used    History  Alcohol Use  . Yes    Comment: occas. which is seldom    Family History  Problem Relation Age of Onset  . Colon cancer Father   . Colon polyps Brother   . Diabetes Brother     maternal aunt  . Coronary artery disease Brother     Review of Systems: Constitutional: no fever chills diaphoresis or fatigue or change in weight.  Head and neck: no hearing loss, no epistaxis, no photophobia or visual disturbance. Respiratory: No cough, shortness of breath or wheezing. Cardiovascular: No chest pain peripheral edema, palpitations. Gastrointestinal: No abdominal distention, no abdominal pain, no change in bowel habits hematochezia or melena. Genitourinary: No dysuria, no frequency, no urgency, no nocturia. Musculoskeletal:No arthralgias, no back pain, no gait disturbance or myalgias. Neurological: No dizziness, no headaches, no numbness, no seizures, no syncope, no weakness, no tremors. Hematologic: No lymphadenopathy, no easy bruising. Psychiatric: No confusion, no hallucinations, no sleep disturbance.    Physical Exam: Filed Vitals:   04/21/13 1035  BP: 112/79  Pulse: 80   the general appearance reveals a well-developed well-nourished woman in no distress.The head and neck exam reveals pupils equal and reactive.  Extraocular movements are full.  There is no scleral icterus.  The mouth and pharynx are normal.  The neck is supple.  The carotids reveal no bruits.  The jugular venous pressure is normal.  The  thyroid is not enlarged.  There is no lymphadenopathy.  The chest is clear to percussion and auscultation.  There are no rales or rhonchi.  Expansion of the chest is symmetrical.  The precordium is quiet.  The first heart sound is normal.  The second heart sound is physiologically split.  There is no murmur gallop rub or click.  There is no abnormal lift or heave.  The abdomen is soft and nontender.  The bowel sounds are normal.  The liver and spleen are not enlarged.  There are no abdominal masses.  There are no  abdominal bruits.  Extremities reveal good pedal pulses.  There is no phlebitis or edema.  There is no cyanosis or clubbing.  Strength is normal and symmetrical in all extremities.  There is no lateralizing weakness.  There are no sensory deficits.  The skin is warm and dry.  There is no rash.  EKG today shows atrial sensed ventricular paced rhythm.  Assessment / Plan: Overall the patient has been doing well.  Her pacemaker is working well and she's had no further dizzy spells.  We are checking lab work today.  Her husband is also concerned that she might have diabetes and we will check a blood sugar. No new medications were prescribed today.  Recheck in 6 months for followup office visit

## 2013-04-21 NOTE — Assessment & Plan Note (Signed)
The patient has a history of hyperlipidemia.  She is not presently on any statin therapy.  We are checking fasting labs today.  She is trying to stay on a heart healthy diet and she avoids red meat

## 2013-04-23 NOTE — Progress Notes (Signed)
Quick Note:  Please report to patient. The recent labs are stable. Continue same medication and careful diet. Cholesterol still sllightly high. Watch diet carefully. Lose weight. ______

## 2013-04-24 NOTE — Telephone Encounter (Signed)
Advised husband. Also explained EKG done in office not EEG, verbalized understanding.

## 2013-04-24 NOTE — Telephone Encounter (Signed)
Follow Up   Pt returning call about EEG

## 2013-04-24 NOTE — Telephone Encounter (Signed)
Message copied by Burnell Blanks on Mon Apr 24, 2013  4:39 PM ------      Message from: Cassell Clement      Created: Sun Apr 23, 2013  6:44 PM       Please report to patient.  The recent labs are stable. Continue same medication and careful diet. Cholesterol still sllightly high.  Watch diet carefully. Lose weight. ------

## 2013-04-25 ENCOUNTER — Other Ambulatory Visit: Payer: Medicare Other

## 2013-04-26 ENCOUNTER — Ambulatory Visit (INDEPENDENT_AMBULATORY_CARE_PROVIDER_SITE_OTHER): Payer: Medicare Other | Admitting: Radiology

## 2013-04-26 ENCOUNTER — Telehealth: Payer: Self-pay | Admitting: *Deleted

## 2013-04-26 ENCOUNTER — Telehealth: Payer: Self-pay | Admitting: Cardiology

## 2013-04-26 DIAGNOSIS — R413 Other amnesia: Secondary | ICD-10-CM

## 2013-04-26 DIAGNOSIS — F070 Personality change due to known physiological condition: Secondary | ICD-10-CM

## 2013-04-26 DIAGNOSIS — F09 Unspecified mental disorder due to known physiological condition: Secondary | ICD-10-CM

## 2013-04-26 DIAGNOSIS — F0789 Other personality and behavioral disorders due to known physiological condition: Secondary | ICD-10-CM

## 2013-04-26 NOTE — Telephone Encounter (Signed)
Returned call, actually called husband regarding himself

## 2013-04-26 NOTE — Telephone Encounter (Signed)
New message     Returned nurses call 

## 2013-05-02 ENCOUNTER — Other Ambulatory Visit: Payer: Self-pay

## 2013-05-02 DIAGNOSIS — R413 Other amnesia: Secondary | ICD-10-CM

## 2013-05-02 DIAGNOSIS — F09 Unspecified mental disorder due to known physiological condition: Secondary | ICD-10-CM

## 2013-05-02 DIAGNOSIS — F0789 Other personality and behavioral disorders due to known physiological condition: Secondary | ICD-10-CM

## 2013-05-02 MED ORDER — DONEPEZIL HCL 5 MG PO TABS: ORAL_TABLET | ORAL | Status: DC

## 2013-05-02 NOTE — Telephone Encounter (Signed)
Husband states patient needs brand name only and DAW. Will forward to Dr. Vickey Huger.

## 2013-05-03 NOTE — Telephone Encounter (Signed)
Call pharmacy about the fax that was sent over because the prescription that was sent did not have a strength. Inform pharmacy per Dr. Oliva Bustard notes on 03/22/13, patient was to take 5 mg (Aricept) for 30 days then increase after that to 10 mg (Aricept) daily.

## 2013-05-04 ENCOUNTER — Telehealth: Payer: Self-pay | Admitting: Neurology

## 2013-05-08 NOTE — Procedures (Signed)
    History:  [name] is a 78 year old patient with a history of a progressive memory disturbance. There is a history of cerebrovascular disease, TIA. The patient is being evaluated for episodes of sudden falling.  This is a routine EEG. No skull defects are noted. Medications include vitamin D supplementation, Aricept, and multivitamins.   EEG classification: Normal awake and drowsy  Description of the recording: The background rhythms of this recording consists of a fairly well modulated medium amplitude alpha rhythm of 10 Hz that is reactive to eye opening and closure. As the record progresses, the patient appears to remain in the waking state throughout the recording. Photic stimulation was performed, resulting in a bilateral and symmetric photic driving response. Hyperventilation was also performed, resulting in a minimal buildup of the background rhythm activities without significant slowing seen. Toward the end of the recording, the patient enters the drowsy state with slight symmetric slowing seen. The patient never enters stage II sleep. At no time during the recording does there appear to be evidence of spike or spike wave discharges or evidence of focal slowing. EKG monitor shows no evidence of cardiac rhythm abnormalities with a heart rate of 60.  Impression: This is a normal EEG recording in the waking and drowsy state. No evidence of ictal or interictal discharges are seen.

## 2013-05-12 ENCOUNTER — Telehealth: Payer: Self-pay | Admitting: Gynecologic Oncology

## 2013-05-12 NOTE — Telephone Encounter (Signed)
[name] called and stated that she wanted to cancel her upcoming surgery with Dr. Stanford Breed on 05/23/13.  The situation was explained to the [name] but she still wanted to cancel.  Hx mild memory loss and unable to speak with the [name]'s husband at this time.  Dr. Stanford Breed notified of the situation via e-mail.  Will attempt to reach the husband to confirm surgery cancellation this afternoon.

## 2013-05-12 NOTE — Telephone Encounter (Signed)
Dr. Stanford Breed called the patient and patient still would like to cancel surgery at this time.

## 2013-05-23 ENCOUNTER — Ambulatory Visit (HOSPITAL_COMMUNITY)
Admission: RE | Admit: 2013-05-23 | Payer: Medicare Other | Source: Ambulatory Visit | Admitting: Obstetrics & Gynecology

## 2013-05-23 ENCOUNTER — Encounter (HOSPITAL_COMMUNITY): Admission: RE | Payer: Self-pay | Source: Ambulatory Visit

## 2013-05-23 SURGERY — LAPAROSCOPY, DIAGNOSTIC
Anesthesia: General | Laterality: Right

## 2013-05-30 ENCOUNTER — Telehealth: Payer: Self-pay | Admitting: Neurology

## 2013-05-30 NOTE — Telephone Encounter (Signed)
Informed patient's husband that according to LOV notes from Dr Vickey Huger that patient is starting with 5 mg dosage of aricept, then after 30 days,it  will be increased to 10 mg daily, husband verbalized understanding. They were given EEG normal results per Dr Vickey Huger findings.Husband understood.

## 2013-05-30 NOTE — Telephone Encounter (Signed)
See telephone note from 05/29/13

## 2013-05-30 NOTE — Telephone Encounter (Signed)
Shared EEG results with husband, he verbalized understanding but said that he went to pharmacy to pick up 5 mg of aricept but it was a bottle of  10mg  in bag, what should he do?

## 2013-05-30 NOTE — Telephone Encounter (Signed)
done

## 2013-05-30 NOTE — Telephone Encounter (Signed)
Patient's husband calling in regards to his wife's Aricept prescription. Patient's husband is wondering if they are taking the correct dosage or if it was changed. Please call.

## 2013-06-12 ENCOUNTER — Ambulatory Visit (INDEPENDENT_AMBULATORY_CARE_PROVIDER_SITE_OTHER): Payer: Medicare Other | Admitting: *Deleted

## 2013-06-12 ENCOUNTER — Encounter: Payer: Self-pay | Admitting: Internal Medicine

## 2013-06-12 ENCOUNTER — Other Ambulatory Visit: Payer: Self-pay | Admitting: Internal Medicine

## 2013-06-12 DIAGNOSIS — I440 Atrioventricular block, first degree: Secondary | ICD-10-CM

## 2013-06-12 DIAGNOSIS — I442 Atrioventricular block, complete: Secondary | ICD-10-CM

## 2013-06-16 LAB — MDC_IDC_ENUM_SESS_TYPE_REMOTE
Battery Voltage: 3.01 V
Brady Statistic AP VS Percent: 9.4 %
Brady Statistic AS VP Percent: 20 %
Brady Statistic AS VS Percent: 52 %
Brady Statistic RA Percent Paced: 27 %
Brady Statistic RV Percent Paced: 38 %
Date Time Interrogation Session: 20141222075452
Implantable Pulse Generator Serial Number: 7474456
Lead Channel Impedance Value: 440 Ohm
Lead Channel Impedance Value: 560 Ohm
Lead Channel Pacing Threshold Amplitude: 0.75 V
Lead Channel Pacing Threshold Pulse Width: 0.5 ms
Lead Channel Sensing Intrinsic Amplitude: 3.1 mV
Lead Channel Setting Pacing Amplitude: 2 V
Lead Channel Setting Pacing Pulse Width: 0.5 ms
Lead Channel Setting Sensing Sensitivity: 2 mV

## 2013-06-18 ENCOUNTER — Encounter: Payer: Self-pay | Admitting: Internal Medicine

## 2013-06-18 NOTE — Patient Instructions (Signed)
Return in 6 weeks for repeat TSH. Appointment will be made with neurologist.

## 2013-07-05 ENCOUNTER — Telehealth: Payer: Self-pay

## 2013-07-05 ENCOUNTER — Encounter: Payer: Self-pay | Admitting: *Deleted

## 2013-07-05 NOTE — Telephone Encounter (Addendum)
Patient's appointment needs to be r/s because Hoyle Sauer has never seen before and Dr. Brett Fairy is out of the office on Friday.  Hoyle Sauer stated that the patient can be scheduled with Jeani Hawking.  Patient's telephone number is disconnected but she did have her neighbor listed as a emergency contact.  Mr. Inez Pilgrim (patient's neighbor) was called and asked to let the patient know we needed to speak to her concerning her appointment on this Friday, July 07, 2013.

## 2013-07-06 NOTE — Telephone Encounter (Signed)
Called patient's neighbor again this morning, to see if he could go over and ask the patient to give [place] a call regarding her appointment that is scheduled for tomorrow.  Patient's neighbor stated they are having their telephone service changed from a different provider that is why we have been unable to get her on the telephone.

## 2013-07-07 ENCOUNTER — Telehealth: Payer: Self-pay | Admitting: *Deleted

## 2013-07-07 ENCOUNTER — Other Ambulatory Visit: Payer: Self-pay | Admitting: Gynecologic Oncology

## 2013-07-07 ENCOUNTER — Ambulatory Visit (INDEPENDENT_AMBULATORY_CARE_PROVIDER_SITE_OTHER): Payer: Medicare Other | Admitting: Nurse Practitioner

## 2013-07-07 ENCOUNTER — Encounter: Payer: Self-pay | Admitting: Nurse Practitioner

## 2013-07-07 VITALS — BP 125/60 | HR 76 | Ht 68.5 in | Wt 145.0 lb

## 2013-07-07 DIAGNOSIS — R413 Other amnesia: Secondary | ICD-10-CM | POA: Insufficient documentation

## 2013-07-07 DIAGNOSIS — F09 Unspecified mental disorder due to known physiological condition: Secondary | ICD-10-CM

## 2013-07-07 DIAGNOSIS — N83209 Unspecified ovarian cyst, unspecified side: Secondary | ICD-10-CM

## 2013-07-07 NOTE — Telephone Encounter (Signed)
Per Hoyle Sauer, she will see the patient if she does come to the appointment.

## 2013-07-07 NOTE — Telephone Encounter (Signed)
Called pt to notify pt of future appointments. Message was left with Mr. Matusek that pt has an appt on 08/07/2013 @ [facility] for a u/s. Mr. Laprise was given instruction that Ms. Foutz arrive 75min early for her appointment and arrive with a full bladder(32oz of water). Mr. Moon was also notified that that Mrs. Belfield also as a follow appointment with Dr. Fermin Schwab on 08/18/2013.

## 2013-07-07 NOTE — Progress Notes (Signed)
[facility]  PATIENT: [name] DOB: 1935/03/01   REASON FOR VISIT: Followup for mild cognitive impairment   HISTORY OF PRESENT ILLNESS: [name], 79 year old female returns for followup. She was initially evaluated 03/22/2013 by Dr. Brett Fairy for memory loss. Her EEG after the visit was normal. Dementia labs return normal. Ct of the head without change from last in Jan 2011. She is currently taking Aricept 10 mg daily and denies any side effects. She claims she exercises by walking every day. She does minimal cooking. She is able to perform her activities of daily living such as dressing bathing etc. She does not do the finances. She lives with her husband. She does not drive.    HISTORY: of memory loss. 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 The patient has a history of depression, hypothyroidism, possible pre-diabetes. Dr. Renold Genta is concerned about possible early dementia. The patient also states that she has a pacemaker has a history of a first degree A V. block. She'll has a history of dizziness and possible TIA and is an established patient with Dr. Leonie Man at [facility] neurologic.  She had transient left-sided weakness when he saw her in 2011. In March 2011, she underwent an MRI of the brain ( ordering physician was Dr. Brett Fairy) with no evidence of tumor or infarct or bleed. Evident was chronic microvascular ischemia ,but This was considered age related ( Dr Leonie Man interpreted) .  The evaluation for possible stroke or TIA in March 2011 followed a history of a sudden fall within the confines of her home. She had one from the livings living room to the kitchen and fell between the coffee table and the sofa. Apparently there was a second episode when she fell suddenly to the right. The second episode was associated with numbness of the left side. 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 A cardiac monitoring revealed AV block as the possible cause for her falls. She has seen Dr. Crissie Sickles twice this year for syncope with a sick sinus syndrome ( but states she hadn't fainted " ).  Meanwhile the patient feels that her memory has never been that much better and that in all functions have been taking away from her- she never needed to keep the financial books.  She feels, that she is performrng at the same level as last year or 2 years ago.  Extensive laboratory results were already performed by her primary care physician and reviewed today, she has normal problems with sleep, she has no nocturnal hallucinations she reports normal light terrors or REM behavior disorder, she also had no recent falls. There is no parkinsonism, shuffling gait, urinary incontinence.      REVIEW OF SYSTEMS: Full 14 system review of systems performed and notable only for those listed, all others are neg:  Constitutional: N/A  Cardiovascular: N/A  Ear/Nose/Throat: Ringing in the ears Skin: N/A  Eyes: N/A  Respiratory: N/A  Gastroitestinal: N/A  Genitourinary: Frequency of urine Hematology/Lymphatic: N/A  Endocrine: N/A Musculoskeletal: Joint pain, back pain Allergy/Immunology: N/A  Neurological: Memory loss  Psychiatric: N/A   ALLERGIES: Allergies  Allergen Reactions  . Sulfa Antibiotics Diarrhea and Rash  . Sulfasalazine Diarrhea and Rash    HOME MEDICATIONS: Outpatient Prescriptions Prior to Visit  Medication Sig Dispense Refill  . Cholecalciferol (VITAMIN D-3 PO) Take 1 capsule by mouth daily.      Marland Kitchen  Lactobacillus (ACIDOPHOLUS PO) Take 1 tablet by mouth daily.       . methylcellulose (ARTIFICIAL TEARS) 1 % ophthalmic solution Place 1 drop into both eyes as needed (eye irritation).      . Multiple Vitamins-Minerals (MULTIVITAMIN WITH MINERALS) tablet Take 1 tablet by mouth daily.        Marland Kitchen donepezil (ARICEPT) 5 MG tablet Brand Name Only.  30 tablet  1    No facility-administered medications prior to visit.    PAST MEDICAL HISTORY: Past Medical History  Diagnosis Date  . Hyperlipidemia   . Thyroid disease   . Arthritis   . Asthma   . Depression   . Colon polyps   . Environmental allergies   . Complete heart block -intermittent     a. 11/2012 s/p SJM Accent DR RF dc ppm.  . Osteoarthritis     hands  . Memory retention disorder 03/22/2013    PAST SURGICAL HISTORY: Past Surgical History  Procedure Laterality Date  . Knee surgery    . Salpingoophorectomy      right  . Appendectomy  1965  . Colonoscopy  2007    adenomatous polyps  . Insert / replace / remove pacemaker  12/02/2012     Dr Lovena Le  . Hand surgery    . Ovarian cystectomy  1965  . Tumor removed  1985    abdomen  . Cervical polypectomy  1996    FAMILY HISTORY: Family History  Problem Relation Age of Onset  . Colon cancer Father   . Colon polyps Brother   . Diabetes Brother     maternal aunt  . Coronary artery disease Brother     SOCIAL HISTORY: History   Social History  . Marital Status: Married    Spouse Name: [name]    Number of Children: 3  . Years of Education: College   Occupational History  . elem. school teacher     retired   Social History Main Topics  . Smoking status: Never Smoker   . Smokeless tobacco: Never Used  . Alcohol Use: Yes     Comment: occas. which is seldom  . Drug Use: No  . Sexual Activity: Not on file   Other Topics Concern  . Not on file   Social History Narrative   Patient has one daughter, two adopted children(son,daughter).   Patient lives at home with spouse.      PHYSICAL EXAM  Filed Vitals:   07/07/13 0958  BP: 125/60  Pulse: 76  Height: 5' 8.5" (1.74 m)  Weight: 145 lb (65.772 kg)   Body mass index is 21.72 kg/(m^2).  Generalized: Well developed, well groomed in no acute distress  Head: normocephalic and atraumatic,. Oropharynx benign  Neck: Supple,  Cardiac: Regular rate rhythm,    Musculoskeletal: No deformity   Neurological examination   Mentation: Alert oriented to time, place, history taking.MMSE 27/30. Missing 2/ 3 recall and the date.AFT 11.  Follows all commands speech and language fluent  Cranial nerve II-XII: Pupils were equal round reactive to light extraocular movements were full, visual field were full on confrontational test. Facial sensation and strength were normal. hearing was intact to finger rubbing bilaterally. Uvula tongue midline. Head turning and shoulder shrug were normal and symmetric.Tongue protrusion into cheek strength was normal. Motor: normal bulk and tone, full strength in the BUE, BLE, No focal weakness Sensory: normal and symmetric to light touch, pinprick, and  vibration  Coordination: finger-nose-finger, heel-to-shin bilaterally, no dysmetria Reflexes: Brachioradialis  2/2, biceps 2/2, triceps 2/2, patellar 2/2, Achilles 2/2, plantar responses were flexor bilaterally. Gait and Station: Rising up from seated position without assistance, normal stance,  moderate stride, good arm swing, smooth turning, able to perform tiptoe, and heel walking without difficulty. Tandem gait is steady, no assistive device  DIAGNOSTIC DATA (LABS, IMAGING, TESTING) - I reviewed patient records, labs, notes, testing and imaging myself where available.  Lab Results  Component Value Date   WBC 5.8 12/20/2012   HGB 12.9 12/20/2012   HCT 38.4 12/20/2012   MCV 90.6 12/20/2012   PLT 228 12/20/2012      Component Value Date/Time   NA 138 04/21/2013 1127   K 4.1 04/21/2013 1127   CL 102 04/21/2013 1127   CO2 30 04/21/2013 1127   GLUCOSE 85 04/21/2013 1127   BUN 18 04/21/2013 1127   CREATININE 0.8 04/21/2013 1127   CREATININE 0.94 12/20/2012 0925   CALCIUM 9.3 04/21/2013 1127   PROT 6.8 04/21/2013 1127   ALBUMIN 4.0 04/21/2013 1127   AST 29 04/21/2013 1127   ALT 20 04/21/2013 1127   ALKPHOS 63 04/21/2013 1127   BILITOT 0.7 04/21/2013 1127   GFRNONAA 69* 11/26/2012  0920   GFRAA 80* 11/26/2012 0920   Lab Results  Component Value Date   CHOL 228* 04/21/2013   HDL 88.50 04/21/2013   LDLCALC 129* 12/20/2012   LDLDIRECT 127.6 04/21/2013   TRIG 75.0 04/21/2013   CHOLHDL 3 04/21/2013    Lab Results  Component Value Date   TSH 6.640* 12/20/2012   ASSESSMENT AND PLAN  79 y.o. year old female  has a past medical history of Hyperlipidemia; Thyroid disease; Arthritis; Depression; Complete heart block -intermittent; Osteoarthritis; and Memory retention disorder (03/22/2013). here to follow up. EEG was normal. Dementia labs returned normal. CT of the head performed 04/12/2013 without change from January 2011.  Memory score is stable Continue Aricept at 10mg  daily.  F/U in 6 months Dennie Bible, [facility], [facility], APRN  [facility] [address] [place],  [address] [phone]

## 2013-07-07 NOTE — Patient Instructions (Signed)
Memory score is stable Continue Aricept at 10mg  daily.  F/U in 6 months

## 2013-07-07 NOTE — Progress Notes (Signed)
Patient's husband called stating "My wife is ready to have her scan now."  Previous encounter was for surgery in [place] with Dr. Fermin Schwab, which she refused to have.  The husband states they still do not want to proceed with surgery.  Dr. Fermin Schwab notified of the request.  Requesting ultrasound with follow up appointment after to discuss further recommendations.

## 2013-07-10 ENCOUNTER — Ambulatory Visit (INDEPENDENT_AMBULATORY_CARE_PROVIDER_SITE_OTHER): Payer: Medicare Other | Admitting: *Deleted

## 2013-07-10 DIAGNOSIS — Z950 Presence of cardiac pacemaker: Secondary | ICD-10-CM

## 2013-07-10 LAB — MDC_IDC_ENUM_SESS_TYPE_INCLINIC
Battery Remaining Longevity: 118.8 mo
Brady Statistic RA Percent Paced: 28 %
Date Time Interrogation Session: 20150119155047
Implantable Pulse Generator Model: 2210
Lead Channel Impedance Value: 487.5 Ohm
Lead Channel Sensing Intrinsic Amplitude: 12 mV
Lead Channel Sensing Intrinsic Amplitude: 2.7 mV
Lead Channel Setting Pacing Pulse Width: 0.5 ms
MDC IDC MSMT BATTERY VOLTAGE: 3.01 V
MDC IDC MSMT LEADCHNL RA IMPEDANCE VALUE: 412.5 Ohm
MDC IDC PG SERIAL: 7474456
MDC IDC SET LEADCHNL RA PACING AMPLITUDE: 2 V
MDC IDC SET LEADCHNL RV PACING AMPLITUDE: 2.5 V
MDC IDC SET LEADCHNL RV SENSING SENSITIVITY: 2 mV
MDC IDC STAT BRADY RV PERCENT PACED: 35 %

## 2013-07-10 NOTE — Progress Notes (Signed)
Pt c/o of "fairly recent" new dyspnea upon exertion. States the SOB does not occur until prolonged periods of exertion such as gardening. Initially dec'd rate response by changing slope from 10 to 9 and reaction time from medium to slow. Had pt walk stairs with better outcome but not completely solved. Turned off rate response due to pt's intrinsic sinus rhythm upper rate behavior.   Pt will contact device clinic if SOB upon exertion occurs again.  Next remote 09/13/13.

## 2013-07-12 NOTE — Progress Notes (Signed)
I reviewed note and agree with plan.   Penni Bombard, MD 7/85/8850, 2:77 PM Certified in Neurology, Neurophysiology and Neuroimaging  [facility] [address], [place], [place] [address] [phone]

## 2013-07-19 ENCOUNTER — Telehealth: Payer: Self-pay | Admitting: Neurology

## 2013-07-19 MED ORDER — DONEPEZIL HCL 10 MG PO TABS: 10.0000 mg | ORAL_TABLET | Freq: Every day | ORAL | Status: DC

## 2013-07-19 NOTE — Telephone Encounter (Signed)
Rx has been sent  

## 2013-07-19 NOTE — Telephone Encounter (Signed)
Pt's husband called in asking for a refill on Mrs. Owen's Aricept prescription.  He stated that the bottle has no refills and it says Doctor's authorization required.  They would like the prescription to be sent to [facility] on [address].  Thank you

## 2013-07-21 ENCOUNTER — Other Ambulatory Visit: Payer: Self-pay | Admitting: Neurology

## 2013-07-21 ENCOUNTER — Encounter: Payer: Self-pay | Admitting: Internal Medicine

## 2013-07-31 ENCOUNTER — Telehealth: Payer: Self-pay | Admitting: Internal Medicine

## 2013-07-31 NOTE — Telephone Encounter (Signed)
Pt has SOB when bending over repeatedly then standing up when picking up items or picking up her dog which weighs approx 5lbs. I gave pt instructions to send an extra transmission, transmission successfully received. All diagnostics normal. She has had 28 modes switches---longest 30sec. I advised her that I do not think her SOB is related to her device. I let her know she may call again if concerns/questions arise.   Next remote 09/13/13 & ROV w/ Dr. Lovena Le 11/2013.

## 2013-07-31 NOTE — Telephone Encounter (Signed)
New message    Pt had pacemaker adjusted recently-----Talk to [facility] about some of the "feelings" she is having now.

## 2013-08-01 ENCOUNTER — Telehealth: Payer: Self-pay | Admitting: Cardiology

## 2013-08-01 DIAGNOSIS — R0602 Shortness of breath: Secondary | ICD-10-CM

## 2013-08-01 DIAGNOSIS — E039 Hypothyroidism, unspecified: Secondary | ICD-10-CM

## 2013-08-01 DIAGNOSIS — E7800 Pure hypercholesterolemia, unspecified: Secondary | ICD-10-CM

## 2013-08-01 NOTE — Telephone Encounter (Signed)
New message   C/O sob . Ask patient is she asking sob now.  No chestpain.

## 2013-08-01 NOTE — Telephone Encounter (Signed)
Pt called because she has been sob sometime after the  pacemaker implant in June 2014. Pt states she has gotten  SOB gradually. Pt states said that the SOB  is not getting worse; she is able to walk around,  But it  is bothersome. Pt states she would like to know what it is needed to be done. Pt's last office visit with Dr. Mare Ferrari was 04/21/13.

## 2013-08-01 NOTE — Telephone Encounter (Signed)
Left message to call back  

## 2013-08-07 ENCOUNTER — Ambulatory Visit (HOSPITAL_COMMUNITY)
Admission: RE | Admit: 2013-08-07 | Discharge: 2013-08-07 | Disposition: A | Discharge status: Home / Self Care | Payer: Medicare Other | Source: Ambulatory Visit | Attending: Gynecologic Oncology | Admitting: Gynecologic Oncology

## 2013-08-07 DIAGNOSIS — N83209 Unspecified ovarian cyst, unspecified side: Secondary | ICD-10-CM

## 2013-08-14 ENCOUNTER — Encounter: Payer: Self-pay | Admitting: Internal Medicine

## 2013-08-18 ENCOUNTER — Encounter: Payer: Self-pay | Admitting: Gynecology

## 2013-08-18 ENCOUNTER — Ambulatory Visit: Payer: Medicare Other | Attending: Gynecology | Admitting: Gynecology

## 2013-08-18 ENCOUNTER — Ambulatory Visit: Payer: Medicare Other | Admitting: Gynecology

## 2013-08-18 VITALS — BP 118/58 | HR 68 | Temp 98.0°F | Resp 20 | Wt 143.9 lb

## 2013-08-18 DIAGNOSIS — Z79899 Other long term (current) drug therapy: Secondary | ICD-10-CM | POA: Insufficient documentation

## 2013-08-18 DIAGNOSIS — J45909 Unspecified asthma, uncomplicated: Secondary | ICD-10-CM | POA: Insufficient documentation

## 2013-08-18 DIAGNOSIS — N83209 Unspecified ovarian cyst, unspecified side: Secondary | ICD-10-CM | POA: Insufficient documentation

## 2013-08-18 DIAGNOSIS — Z833 Family history of diabetes mellitus: Secondary | ICD-10-CM | POA: Insufficient documentation

## 2013-08-18 DIAGNOSIS — Z800 Family history of malignant neoplasm of digestive organs: Secondary | ICD-10-CM | POA: Insufficient documentation

## 2013-08-18 DIAGNOSIS — F329 Major depressive disorder, single episode, unspecified: Secondary | ICD-10-CM | POA: Insufficient documentation

## 2013-08-18 DIAGNOSIS — Z860100 Personal history of colon polyps, unspecified: Secondary | ICD-10-CM | POA: Insufficient documentation

## 2013-08-18 DIAGNOSIS — F3289 Other specified depressive episodes: Secondary | ICD-10-CM | POA: Insufficient documentation

## 2013-08-18 DIAGNOSIS — Z8249 Family history of ischemic heart disease and other diseases of the circulatory system: Secondary | ICD-10-CM | POA: Insufficient documentation

## 2013-08-18 DIAGNOSIS — E785 Hyperlipidemia, unspecified: Secondary | ICD-10-CM | POA: Insufficient documentation

## 2013-08-18 DIAGNOSIS — M1990 Unspecified osteoarthritis, unspecified site: Secondary | ICD-10-CM | POA: Insufficient documentation

## 2013-08-18 NOTE — Patient Instructions (Signed)
Follow up with Dr. Dellis Filbert in three months for a repeat ultrasound.  Please call for any questions or concerns.

## 2013-08-18 NOTE — Progress Notes (Signed)
Consult Note: Gyn-Onc   [name] 79 y.o. female  No chief complaint on file.   Assessment : Resolution of right ovarian cyst.  Plan: We'll have the patient return to the care of her primary gynecologist and would suggest a repeat ultrasound approximately 3 months. At that ultrasound is negative for cyst, I would discontinue any further evaluation as the patient became symptomatic.  Interval History: Patient returns today for followup. She was initially seen at [facility] on 05/14/2013. At that time she had a 4.5 cm right ovarian cyst with increased vascularity. Surgical removal was recommended although the patient refused. Alternatively, we decided to follow the cyst with a repeat ultrasound. Ultrasound was obtained at [facility] on February 16 which shows that the right ovary is not visualized. The left ovary measures 2.1 x 0.9 x 0.9 cm and a 4 mm benign-appearing cysts. The patient is entirely asymptomatic.  HPI: The patient had a single episode of right-sided pain which resolved. However in workup for pain the patient underwent a pelvic ultrasound and CT scan which showed a 4.5 cm right ovarian cyst. CA 125 was 10. Surgery was recommended but the patient refused.  Review of Systems:10 point review of systems is negative except as noted in interval history.   Vitals: Blood pressure 118/58, pulse 68, temperature 98 F (36.7 C), temperature source Oral, resp. rate 20, weight 143 lb 14.4 oz (65.273 kg).  Physical Exam: General : The patient is a healthy woman in no acute distress.  Pelvic exam: Deferred  Lower extremities: No edema or varicosities. Normal range of motion      Allergies  Allergen Reactions  . Sulfa Antibiotics Diarrhea and Rash  . Sulfasalazine Diarrhea and Rash    Past Medical History  Diagnosis Date  . Hyperlipidemia   . Thyroid disease   . Arthritis   . Asthma   . Depression   . Colon polyps   . Environmental allergies   . Complete heart  block -intermittent     a. 11/2012 s/p SJM Accent DR RF dc ppm.  . Osteoarthritis     hands  . Memory retention disorder 03/22/2013    Past Surgical History  Procedure Laterality Date  . Knee surgery    . Salpingoophorectomy      right  . Appendectomy  1965  . Colonoscopy  2007    adenomatous polyps  . Insert / replace / remove pacemaker  12/02/2012     Dr Lovena Le  . Hand surgery    . Ovarian cystectomy  1965  . Tumor removed  1985    abdomen  . Cervical polypectomy  1996    Current Outpatient Prescriptions  Medication Sig Dispense Refill  . Cholecalciferol (VITAMIN D-3 PO) Take 1 capsule by mouth daily.      Marland Kitchen donepezil (ARICEPT) 10 MG tablet Take 1 tablet (10 mg total) by mouth daily.  90 tablet  1  . Lactobacillus (ACIDOPHOLUS PO) Take 1 tablet by mouth daily.       . methylcellulose (ARTIFICIAL TEARS) 1 % ophthalmic solution Place 1 drop into both eyes as needed (eye irritation).      . Multiple Vitamins-Minerals (MULTIVITAMIN WITH MINERALS) tablet Take 1 tablet by mouth daily.         No current facility-administered medications for this visit.    History   Social History  . Marital Status: Married    Spouse Name: [name]    Number of Children: 3  . Years  of Education: College   Occupational History  . elem. school teacher     retired   Social History Main Topics  . Smoking status: Never Smoker   . Smokeless tobacco: Never Used  . Alcohol Use: Yes     Comment: occas. which is seldom  . Drug Use: No  . Sexual Activity: Not on file   Other Topics Concern  . Not on file   Social History Narrative   Patient has one daughter, two adopted children(son,daughter).   Patient lives at home with spouse.     Family History  Problem Relation Age of Onset  . Colon cancer Father   . Colon polyps Brother   . Diabetes Brother     maternal aunt  . Coronary artery disease Brother       Alvino Chapel, MD 08/18/2013, 10:40 AM

## 2013-09-01 ENCOUNTER — Ambulatory Visit: Payer: Medicare Other | Admitting: Gynecology

## 2013-09-14 ENCOUNTER — Ambulatory Visit (INDEPENDENT_AMBULATORY_CARE_PROVIDER_SITE_OTHER): Payer: Medicare Other | Admitting: *Deleted

## 2013-09-14 DIAGNOSIS — I442 Atrioventricular block, complete: Secondary | ICD-10-CM

## 2013-09-14 DIAGNOSIS — I440 Atrioventricular block, first degree: Secondary | ICD-10-CM

## 2013-09-22 LAB — MDC_IDC_ENUM_SESS_TYPE_REMOTE
Battery Remaining Longevity: 124 mo
Battery Voltage: 3.01 V
Brady Statistic AP VP Percent: 10 %
Brady Statistic RV Percent Paced: 25 %
Implantable Pulse Generator Model: 2210
Implantable Pulse Generator Serial Number: 7474456
Lead Channel Impedance Value: 430 Ohm
Lead Channel Impedance Value: 550 Ohm
Lead Channel Pacing Threshold Amplitude: 0.75 V
Lead Channel Pacing Threshold Amplitude: 1.25 V
Lead Channel Pacing Threshold Pulse Width: 0.5 ms
Lead Channel Setting Pacing Amplitude: 2 V
Lead Channel Setting Sensing Sensitivity: 2 mV
MDC IDC MSMT LEADCHNL RA SENSING INTR AMPL: 4.7 mV
MDC IDC MSMT LEADCHNL RV PACING THRESHOLD PULSEWIDTH: 0.5 ms
MDC IDC MSMT LEADCHNL RV SENSING INTR AMPL: 12 mV
MDC IDC SESS DTM: 20150326042412
MDC IDC SET LEADCHNL RV PACING AMPLITUDE: 2.5 V
MDC IDC SET LEADCHNL RV PACING PULSEWIDTH: 0.5 ms
MDC IDC STAT BRADY AP VS PERCENT: 4 %
MDC IDC STAT BRADY AS VP PERCENT: 15 %
MDC IDC STAT BRADY AS VS PERCENT: 70 %
MDC IDC STAT BRADY RA PERCENT PACED: 14 %

## 2013-09-28 NOTE — Telephone Encounter (Signed)
Spoke with patient and she didn't seem to remember calling or getting my message. She will just discuss with  Dr. Mare Ferrari at her scheduled ov with  Dr. Mare Ferrari on 4/28

## 2013-10-09 ENCOUNTER — Emergency Department (HOSPITAL_COMMUNITY)
Admission: EM | Admit: 2013-10-09 | Discharge: 2013-10-09 | Disposition: A | Discharge status: Home / Self Care | Payer: Medicare Other | Source: Home / Self Care | Attending: Family Medicine | Admitting: Family Medicine

## 2013-10-09 ENCOUNTER — Encounter (HOSPITAL_COMMUNITY): Payer: Self-pay | Admitting: Emergency Medicine

## 2013-10-09 DIAGNOSIS — R197 Diarrhea, unspecified: Secondary | ICD-10-CM

## 2013-10-09 NOTE — Discharge Instructions (Signed)
Clear liquid , bland diet tonight as tolerated, advance on tues or wed as improved, use activia yogurt, a probiotic and imodium as needed, return or see your doctor if any problems.

## 2013-10-09 NOTE — ED Notes (Signed)
Diarrhea, no vomiting.  Patient reports diarrhea started on Saturday.  Diarrhea continues today and patient reports stool as bright yellow.

## 2013-10-09 NOTE — ED Provider Notes (Signed)
CSN: [id number]     Arrival date & time 10/09/13  1219 History   First MD Initiated Contact with Patient 10/09/13 1426     Chief Complaint  Patient presents with   Diarrhea   (Consider location/radiation/quality/duration/timing/severity/associated sxs/prior Treatment) Patient is a 79 y.o. female presenting with diarrhea. The history is provided by the patient and the spouse.  Diarrhea Quality:  Watery Severity:  Mild Onset quality:  Gradual Duration:  2 days Progression:  Unchanged Relieved by:  None tried Worsened by:  Nothing tried Ineffective treatments:  None tried Associated symptoms: no abdominal pain, no fever and no vomiting     Past Medical History  Diagnosis Date   Hyperlipidemia    Thyroid disease    Arthritis    Asthma    Depression    Colon polyps    Environmental allergies    Complete heart block -intermittent     a. 11/2012 s/p SJM Accent DR RF dc ppm.   Osteoarthritis     hands   Memory retention disorder 03/22/2013   Past Surgical History  Procedure Laterality Date   Knee surgery     Salpingoophorectomy      right   Appendectomy  1965   Colonoscopy  2007    adenomatous polyps   Insert / replace / remove pacemaker  12/02/2012     Dr Lovena Le   Hand surgery     Ovarian cystectomy  1965   Tumor removed  1985    abdomen   Cervical polypectomy  1996   Family History  Problem Relation Age of Onset   Colon cancer Father    Colon polyps Brother    Diabetes Brother     maternal aunt   Coronary artery disease Brother    History  Substance Use Topics   Smoking status: Never Smoker    Smokeless tobacco: Never Used   Alcohol Use: Yes     Comment: occas. which is seldom   OB History   Grav Para Term Preterm Abortions TAB SAB Ect Mult Living                 Review of Systems  Constitutional: Negative.  Negative for fever.  Gastrointestinal: Positive for diarrhea. Negative for nausea, vomiting, abdominal pain,  constipation, blood in stool, abdominal distention and rectal pain.       Yellow color.    Allergies  Sulfa antibiotics and Sulfasalazine  Home Medications   Prior to Admission medications   Medication Sig Start Date End Date Taking? Authorizing Provider  Cholecalciferol (VITAMIN D-3 PO) Take 1 capsule by mouth daily.    Historical Provider, MD  donepezil (ARICEPT) 10 MG tablet Take 1 tablet (10 mg total) by mouth daily. 07/21/13   Asencion Partridge Dohmeier, MD  Lactobacillus (ACIDOPHOLUS PO) Take 1 tablet by mouth daily.     Historical Provider, MD  methylcellulose (ARTIFICIAL TEARS) 1 % ophthalmic solution Place 1 drop into both eyes as needed (eye irritation).    Historical Provider, MD  Multiple Vitamins-Minerals (MULTIVITAMIN WITH MINERALS) tablet Take 1 tablet by mouth daily.      Historical Provider, MD   BP 144/84   Pulse 70   Temp(Src) 98.6 F (37 C) (Oral)   Resp 16   SpO2 99% Physical Exam  Nursing note and vitals reviewed. Constitutional: She is oriented to person, place, and time. She appears well-developed and well-nourished.  Cardiovascular: Normal heart sounds.   Abdominal: Soft. Bowel sounds are normal. She exhibits no distension and  no mass. There is no tenderness. There is no rebound and no guarding.  Neurological: She is alert and oriented to person, place, and time.  Skin: Skin is warm and dry.    ED Course  Procedures (including critical care time) Labs Review Labs Reviewed - No data to display  Results for orders placed in visit on 09/14/13  [email or web address]      Result Value Ref Range   Date Time Interrogation Session [id number]     Pulse Generator Manufacturer [employer]     Pulse Gen Model 2210 Accent DR RF     Pulse Gen Serial Number [id number]     RV Sense Sensitivity 2     RV Adaptation Mode Fixed Pacing     RA Pace Amplitude 2     RV Pace PulseWidth 0.5     RV Pace Amplitude 2.5     Lead Channel Status      RA Impedance 430      RA Amplitude 4.7     RA Pacing Amplitude 0.75     RA Pacing PulseWidth 0.5     Lead Channel Status      RV IMPEDANCE 550     RV Amplitude 12     RV Pacing Amplitude 1.25     RV Pacing PulseWidth 0.5     Battery Status MOS     Battery Longevity 124     Battery Voltage 3.01     Brady RA Perc Paced 14     Brady RV Perc Paced 25     Brady AP VP Percent 10     Brady AS VP Percent 15     Brady AP VS Percent 4     Brady AS VS Percent 70     Eval Rhythm AS/VP/VS     Miscellaneous Comment       Value: Pacemaker remote check. Device function reviewed. Impedance and sensing consistent with previous measurements. Histograms appropriate for patient and level of activity. All other diagnostic data reviewed and is appropriate and stable for patient. Real      time EGM shows appropriate sensing and capture. 108 mode switches (<1%)---max dur. 12 sec, Max A 236, Max V 105---SVT. 1 HVR episode---SVT. Estimated longevity 9.4-11.3 years. Plan to follow up via with GT on 6-24 @ 10:45am.   Imaging Review No results found.   MDM   1. Diarrhea in adult patient        Billy Fischer, MD 10/09/13 [id number]

## 2013-10-17 ENCOUNTER — Other Ambulatory Visit: Payer: Medicare Other

## 2013-10-17 ENCOUNTER — Encounter: Payer: Self-pay | Admitting: *Deleted

## 2013-10-17 ENCOUNTER — Ambulatory Visit (INDEPENDENT_AMBULATORY_CARE_PROVIDER_SITE_OTHER): Payer: Medicare Other | Admitting: Cardiology

## 2013-10-17 ENCOUNTER — Encounter (INDEPENDENT_AMBULATORY_CARE_PROVIDER_SITE_OTHER): Payer: Self-pay

## 2013-10-17 ENCOUNTER — Encounter: Payer: Self-pay | Admitting: Cardiology

## 2013-10-17 VITALS — BP 115/55 | HR 69 | Ht 67.0 in | Wt 139.0 lb

## 2013-10-17 DIAGNOSIS — R55 Syncope and collapse: Secondary | ICD-10-CM

## 2013-10-17 DIAGNOSIS — E785 Hyperlipidemia, unspecified: Secondary | ICD-10-CM

## 2013-10-17 DIAGNOSIS — R0602 Shortness of breath: Secondary | ICD-10-CM

## 2013-10-17 DIAGNOSIS — E039 Hypothyroidism, unspecified: Secondary | ICD-10-CM

## 2013-10-17 DIAGNOSIS — R413 Other amnesia: Secondary | ICD-10-CM

## 2013-10-17 DIAGNOSIS — E7800 Pure hypercholesterolemia, unspecified: Secondary | ICD-10-CM

## 2013-10-17 DIAGNOSIS — Z950 Presence of cardiac pacemaker: Secondary | ICD-10-CM

## 2013-10-17 LAB — CBC WITH DIFFERENTIAL/PLATELET
Basophils Absolute: 0 10*3/uL (ref 0.0–0.1)
Basophils Relative: 0.5 % (ref 0.0–3.0)
EOS ABS: 0.1 10*3/uL (ref 0.0–0.7)
Eosinophils Relative: 1.9 % (ref 0.0–5.0)
HCT: 39.1 % (ref 36.0–46.0)
Hemoglobin: 13.2 g/dL (ref 12.0–15.0)
LYMPHS PCT: 25.6 % (ref 12.0–46.0)
Lymphs Abs: 1.2 10*3/uL (ref 0.7–4.0)
MCHC: 33.8 g/dL (ref 30.0–36.0)
MCV: 94.2 fl (ref 78.0–100.0)
MONOS PCT: 10 % (ref 3.0–12.0)
Monocytes Absolute: 0.5 10*3/uL (ref 0.1–1.0)
Neutro Abs: 3 10*3/uL (ref 1.4–7.7)
Neutrophils Relative %: 62 % (ref 43.0–77.0)
PLATELETS: 198 10*3/uL (ref 150.0–400.0)
RBC: 4.15 Mil/uL (ref 3.87–5.11)
RDW: 13.4 % (ref 11.5–14.6)
WBC: 4.9 10*3/uL (ref 4.5–10.5)

## 2013-10-17 LAB — BASIC METABOLIC PANEL
BUN: 21 mg/dL (ref 6–23)
CO2: 26 meq/L (ref 19–32)
Calcium: 9.3 mg/dL (ref 8.4–10.5)
Chloride: 103 mEq/L (ref 96–112)
Creatinine, Ser: 0.7 mg/dL (ref 0.4–1.2)
GFR: 80.93 mL/min (ref >60.00)
GLUCOSE: 73 mg/dL (ref 70–99)
POTASSIUM: 3.7 meq/L (ref 3.5–5.1)
SODIUM: 137 meq/L (ref 135–145)

## 2013-10-17 LAB — HEPATIC FUNCTION PANEL
ALT: 20 U/L (ref 0–35)
AST: 28 U/L (ref 0–37)
Albumin: 3.9 g/dL (ref 3.5–5.2)
Alkaline Phosphatase: 55 U/L (ref 39–117)
BILIRUBIN TOTAL: 0.6 mg/dL (ref 0.3–1.2)
Bilirubin, Direct: 0.1 mg/dL (ref 0.0–0.3)
Total Protein: 6.4 g/dL (ref 6.0–8.3)

## 2013-10-17 LAB — LIPID PANEL
Cholesterol: 191 mg/dL (ref 0–200)
HDL: 74.5 mg/dL (ref >39.00)
LDL Cholesterol: 106 mg/dL — ABNORMAL HIGH (ref 0–99)
TRIGLYCERIDES: 53 mg/dL (ref 0.0–149.0)
Total CHOL/HDL Ratio: 3
VLDL: 10.6 mg/dL (ref 0.0–40.0)

## 2013-10-17 LAB — T4, FREE: Free T4: 0.89 ng/dL (ref 0.60–1.60)

## 2013-10-17 LAB — TSH: TSH: 1.47 u[IU]/mL (ref 0.35–5.50)

## 2013-10-17 NOTE — Patient Instructions (Signed)
Will obtain labs today and call you with the results (lp/bmet/hfp/cbc/t16f/tsh)  Your physician recommends that you continue on your current medications as directed. Please refer to the Current Medication list given to you today.  Your physician wants you to follow-up in: 6 months with fasting labs (lp/bmet/hfp)  You will receive a reminder letter in the mail two months in advance. If you don't receive a letter, please call our office to schedule the follow-up appointment.

## 2013-10-17 NOTE — Progress Notes (Signed)
Mack Hook Date of Birth:  [birth year] [place] [address] [place], [place]  [address] [phone]        Fax   [phone]   History of Present Illness: This 79 year old woman is seen for a scheduled followup office visit. She has a history of intermittent high-grade AV block and has a functioning dual-chamber pacemaker. She is followed by Dr. Lovena Le. She has a history of hypercholesterolemia. She has a history of osteoarthritis. She has also had some memory issues and is followed by Dr. Brett Fairy. Since last visit she has had no new cardiac symptoms. She has not had any dizziness or syncope. No chest pain. Her weight has been stable. She has been trying to watch her diet.  She has had weight loss which she attributes to being on Aricept.   Current Outpatient Prescriptions  Medication Sig Dispense Refill  . Cholecalciferol (VITAMIN D-3 PO) Take 1 capsule by mouth daily.      Marland Kitchen donepezil (ARICEPT) 10 MG tablet Take 1 tablet (10 mg total) by mouth daily.  90 tablet  1  . Lactobacillus (ACIDOPHOLUS PO) Take 1 tablet by mouth daily.       Marland Kitchen levothyroxine (SYNTHROID, LEVOTHROID) 25 MCG tablet As directed      . methylcellulose (ARTIFICIAL TEARS) 1 % ophthalmic solution Place 1 drop into both eyes as needed (eye irritation).      . Multiple Vitamins-Minerals (MULTIVITAMIN WITH MINERALS) tablet Take 1 tablet by mouth daily.        Marland Kitchen OVER THE COUNTER MEDICATION Thyroid medicine       No current facility-administered medications for this visit.    Allergies  Allergen Reactions  . Sulfa Antibiotics Diarrhea and Rash  . Sulfasalazine Diarrhea and Rash    Patient Active Problem List   Diagnosis Date Noted  . 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 Memory loss 07/07/2013  . Memory retention disorder 03/22/2013  . Pacemaker 03/09/2013  . Complete heart block -intermittent   . Syncope  11/18/2012  . Personal history of adenomatous colonic polyps 11/12/2011  . History of TIA (transient ischemic attack) 09/19/2011  . Hypothyroidism 01/13/2011  . Hyperlipidemia 01/13/2011  . First degree AV block 01/13/2011  . Gait disturbance 01/13/2011    History  Smoking status  . Never Smoker   Smokeless tobacco  . Never Used    History  Alcohol Use  . Yes    Comment: occas. which is seldom    Family History  Problem Relation Age of Onset  . Colon cancer Father   . Colon polyps Brother   . Diabetes Brother     maternal aunt  . Coronary artery disease Brother     Review of Systems: Constitutional: no fever chills diaphoresis or fatigue or change in weight.  Head and neck: no hearing loss, no epistaxis, no photophobia or visual disturbance. Respiratory: No cough, shortness of breath or wheezing. Cardiovascular: No chest pain peripheral edema, palpitations. Gastrointestinal: No abdominal distention, no abdominal pain, no change in bowel habits hematochezia or melena. Genitourinary: No dysuria, no frequency, no urgency, no nocturia. Musculoskeletal:No arthralgias, no back pain, no gait disturbance or myalgias. Neurological: No dizziness, no headaches, no numbness, no seizures, no syncope, no weakness, no tremors. Hematologic: No lymphadenopathy, no easy bruising. Psychiatric: No confusion, no hallucinations, no sleep disturbance.    Physical Exam: Filed Vitals:   10/17/13 1043  BP:  115/55  Pulse: 69     Assessment / Plan:      History of Present Illness: This 79 year old woman is seen for a scheduled followup office visit.  She has a history of intermittent high-grade AV block and has a functioning dual-chamber pacemaker.  She is followed by Dr. Lovena Le.  She has a history of hypercholesterolemia.  She has a history of osteoarthritis.  She has also had some memory issues and is followed by Dr. Brett Fairy.  Since last visit she has had no new cardiac symptoms.   She has not had any dizziness or syncope.  No chest pain.  Her weight has been stable.  She has been trying to watch her diet.  Current Outpatient Prescriptions  Medication Sig Dispense Refill  . Cholecalciferol (VITAMIN D-3 PO) Take 1 capsule by mouth daily.      Marland Kitchen donepezil (ARICEPT) 10 MG tablet Take 1 tablet (10 mg total) by mouth daily.  90 tablet  1  . Lactobacillus (ACIDOPHOLUS PO) Take 1 tablet by mouth daily.       Marland Kitchen levothyroxine (SYNTHROID, LEVOTHROID) 25 MCG tablet As directed      . methylcellulose (ARTIFICIAL TEARS) 1 % ophthalmic solution Place 1 drop into both eyes as needed (eye irritation).      . Multiple Vitamins-Minerals (MULTIVITAMIN WITH MINERALS) tablet Take 1 tablet by mouth daily.        Marland Kitchen OVER THE COUNTER MEDICATION Thyroid medicine       No current facility-administered medications for this visit.    Allergies  Allergen Reactions  . Sulfa Antibiotics Diarrhea and Rash  . Sulfasalazine Diarrhea and Rash    Patient Active Problem List   Diagnosis Date Noted  . 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 Memory loss 07/07/2013  . Memory retention disorder 03/22/2013  . Pacemaker 03/09/2013  . Complete heart block -intermittent   . Syncope 11/18/2012  . Personal history of adenomatous colonic polyps 11/12/2011  . History of TIA (transient ischemic attack) 09/19/2011  . Hypothyroidism 01/13/2011  . Hyperlipidemia 01/13/2011  . First degree AV block 01/13/2011  . Gait disturbance 01/13/2011    History  Smoking status  . Never Smoker   Smokeless tobacco  . Never Used    History  Alcohol Use  . Yes    Comment: occas. which is seldom    Family History  Problem Relation Age of Onset  . Colon cancer Father   . Colon polyps Brother   . Diabetes Brother     maternal aunt  . Coronary artery disease Brother     Review of Systems: Constitutional: no fever chills diaphoresis or fatigue or change in  weight.  Head and neck: no hearing loss, no epistaxis, no photophobia or visual disturbance. Respiratory: No cough, shortness of breath or wheezing. Cardiovascular: No chest pain peripheral edema, palpitations. Gastrointestinal: No abdominal distention, no abdominal pain, no change in bowel habits hematochezia or melena. Genitourinary: No dysuria, no frequency, no urgency, no nocturia. Musculoskeletal:No arthralgias, no back pain, no gait disturbance or myalgias. Neurological: No dizziness, no headaches, no numbness, no seizures, no syncope, no weakness, no tremors. Hematologic: No lymphadenopathy, no easy bruising. Psychiatric: No confusion, no hallucinations, no sleep disturbance.    Physical Exam: Filed Vitals:   10/17/13 1043  BP: 115/55  Pulse: 69   the general appearance reveals a well-developed well-nourished woman in no distress.The head and neck exam reveals pupils equal and reactive.  Extraocular movements are  full.  There is no scleral icterus.  The mouth and pharynx are normal.  The neck is supple.  The carotids reveal no bruits.  The jugular venous pressure is normal.  The  thyroid is not enlarged.  There is no lymphadenopathy.  The chest is clear to percussion and auscultation.  There are no rales or rhonchi.  Expansion of the chest is symmetrical.  The precordium is quiet.  The first heart sound is normal.  The second heart sound is physiologically split.  There is no murmur gallop rub or click.  There is no abnormal lift or heave.  The abdomen is soft and nontender.  The bowel sounds are normal.  The liver and spleen are not enlarged.  There are no abdominal masses.  There are no abdominal bruits.  Extremities reveal good pedal pulses.  There is no phlebitis or edema.  There is no cyanosis or clubbing.  Strength is normal and symmetrical in all extremities.  There is no lateralizing weakness.  There are no sensory deficits.  The skin is warm and dry.  There is no  rash.    Assessment / Plan: The patient denies any cardiac symptoms today.  Overall she is doing well.  She attributes her weight loss to being on Aricept.  She and her husband are requesting Prevnar 13 vaccine and we will give him prescriptions for that. Lab work today is pending.  Recheck in 6 months for followup office visit panel hepatic function panel and basal metabolic panel.

## 2013-10-17 NOTE — Assessment & Plan Note (Signed)
The patient has had no dizzy spells or syncope since implantation for dual-chamber pacemaker in 2014.

## 2013-10-17 NOTE — Assessment & Plan Note (Signed)
The patient is clinically euthyroid.  We are checking thyroid function today

## 2013-10-17 NOTE — Assessment & Plan Note (Signed)
Patient has a history of hypercholesterolemia.  She is not on any statin therapy.  She has had recent 8 pound weight loss.  We are checking fasting labs today.

## 2013-10-18 NOTE — Progress Notes (Signed)
Quick Note:  Please report to patient. The recent labs are stable. Continue same medication and careful diet. ______ 

## 2013-10-27 ENCOUNTER — Encounter: Payer: Self-pay | Admitting: Internal Medicine

## 2013-11-23 ENCOUNTER — Encounter: Payer: Self-pay | Admitting: Internal Medicine

## 2013-11-23 ENCOUNTER — Ambulatory Visit (INDEPENDENT_AMBULATORY_CARE_PROVIDER_SITE_OTHER): Payer: Medicare Other | Admitting: Internal Medicine

## 2013-11-23 VITALS — BP 110/62 | HR 73 | Ht 67.0 in | Wt 140.0 lb

## 2013-11-23 DIAGNOSIS — Z950 Presence of cardiac pacemaker: Secondary | ICD-10-CM

## 2013-11-23 DIAGNOSIS — I442 Atrioventricular block, complete: Secondary | ICD-10-CM

## 2013-11-23 LAB — MDC_IDC_ENUM_SESS_TYPE_INCLINIC
Battery Remaining Longevity: 130.8 mo
Brady Statistic RV Percent Paced: 31 %
Implantable Pulse Generator Model: 2210
Lead Channel Impedance Value: 450 Ohm
Lead Channel Pacing Threshold Amplitude: 0.5 V
Lead Channel Pacing Threshold Amplitude: 0.5 V
Lead Channel Pacing Threshold Amplitude: 1.25 V
Lead Channel Pacing Threshold Amplitude: 1.25 V
Lead Channel Pacing Threshold Pulse Width: 0.5 ms
Lead Channel Pacing Threshold Pulse Width: 0.5 ms
Lead Channel Sensing Intrinsic Amplitude: 12 mV
Lead Channel Sensing Intrinsic Amplitude: 3.6 mV
Lead Channel Setting Pacing Amplitude: 2 V
Lead Channel Setting Pacing Amplitude: 2.5 V
Lead Channel Setting Pacing Pulse Width: 0.5 ms
MDC IDC MSMT BATTERY VOLTAGE: 3.01 V
MDC IDC MSMT LEADCHNL RA PACING THRESHOLD PULSEWIDTH: 0.5 ms
MDC IDC MSMT LEADCHNL RA PACING THRESHOLD PULSEWIDTH: 0.5 ms
MDC IDC MSMT LEADCHNL RV IMPEDANCE VALUE: 512.5 Ohm
MDC IDC PG SERIAL: 7474456
MDC IDC SESS DTM: 20150604153918
MDC IDC SET LEADCHNL RV SENSING SENSITIVITY: 2 mV
MDC IDC STAT BRADY RA PERCENT PACED: 15 %

## 2013-11-23 NOTE — Progress Notes (Signed)
HPI [name] returns today for followup. She is a very pleasant 79 year old woman with a history of symptomatic bradycardia, secondary to sinus node dysfunction, status post permanent pacemaker insertion. She also has a history of heart block. In the interim, she has been stable. She denies chest pain. No peripheral edema. She will have episodes where she feels sob when she bends over and comes back up, lasting for less than a minute. This appears to correlate with her episodes of SVT. Allergies  Allergen Reactions  . Sulfa Antibiotics Diarrhea and Rash  . Sulfasalazine Diarrhea and Rash     Current Outpatient Prescriptions  Medication Sig Dispense Refill  . Cholecalciferol (VITAMIN D-3 PO) Take 1 capsule by mouth daily.      Marland Kitchen donepezil (ARICEPT) 10 MG tablet Take 1 tablet (10 mg total) by mouth daily.  90 tablet  1  . Lactobacillus (ACIDOPHOLUS PO) Take 1 tablet by mouth daily.       Marland Kitchen levothyroxine (SYNTHROID, LEVOTHROID) 25 MCG tablet As directed      . methylcellulose (ARTIFICIAL TEARS) 1 % ophthalmic solution Place 1 drop into both eyes as needed (eye irritation).      . Multiple Vitamins-Minerals (MULTIVITAMIN WITH MINERALS) tablet Take 1 tablet by mouth daily.        Marland Kitchen OVER THE COUNTER MEDICATION Thyroid medicine       No current facility-administered medications for this visit.     Past Medical History  Diagnosis Date  . Hyperlipidemia   . Thyroid disease   . Arthritis   . Asthma   . Depression   . Colon polyps   . Environmental allergies   . Complete heart block -intermittent     a. 11/2012 s/p SJM [facility] DR RF dc ppm.  . Osteoarthritis     hands  . Memory retention disorder 03/22/2013    ROS:   All systems reviewed and negative except as noted in the HPI.   Past Surgical History  Procedure Laterality Date  . Knee surgery    . Salpingoophorectomy      right  . Appendectomy  1965  . Colonoscopy  2007    adenomatous polyps  . Insert / replace / remove  pacemaker  12/02/2012     Dr Lovena Le  . Hand surgery    . Ovarian cystectomy  1965  . Tumor removed  1985    abdomen  . Cervical polypectomy  1996     Family History  Problem Relation Age of Onset  . Colon cancer Father   . Colon polyps Brother   . Diabetes Brother     maternal aunt  . Coronary artery disease Brother      History   Social History  . Marital Status: Married    Spouse Name: [name]    Number of Children: 3  . Years of Education: College   Occupational History  . elem. school teacher     retired   Social History Main Topics  . Smoking status: Never Smoker   . Smokeless tobacco: Never Used  . Alcohol Use: Yes     Comment: occas. which is seldom  . Drug Use: No  . Sexual Activity: Not on file   Other Topics Concern  . Not on file   Social History Narrative   Patient has one daughter, two adopted children(son,daughter).   Patient lives at home with spouse.      BP 110/62  Pulse 73  Ht 5\' 7"  (1.702 m)  Wt 140 lb (63.504 kg)  BMI 21.92 kg/m2  Physical Exam:  Well appearing 79 year old woman, NAD HEENT: Unremarkable Neck:  6 cm JVD, no thyromegally Back:  No CVA tenderness Lungs:  Clear with no wheezes, rales, or rhonchi. HEART:  Regular rate rhythm, no murmurs, no rubs, no clicks Abd:  soft, positive bowel sounds, no organomegally, no rebound, no guarding Ext:  2 plus pulses, no edema, no cyanosis, no clubbing Skin:  No rashes no nodules Neuro:  CN II through XII intact, motor grossly intact   DEVICE  Normal device function.  See PaceArt for details.   Assess/Plan:

## 2013-11-23 NOTE — Assessment & Plan Note (Signed)
Her [employer] DDD PM is working normally. She is having brief episodes of SVT. We will follow.

## 2013-11-23 NOTE — Patient Instructions (Signed)
Your physician wants you to follow-up in: 12 months with Dr. Taylor. You will receive a reminder letter in the mail two months in advance. If you don't receive a letter, please call our office to schedule the follow-up appointment.    

## 2013-11-28 ENCOUNTER — Encounter: Payer: Self-pay | Admitting: Internal Medicine

## 2013-12-13 ENCOUNTER — Encounter: Payer: Medicare Other | Admitting: Internal Medicine

## 2014-01-16 ENCOUNTER — Other Ambulatory Visit: Payer: Self-pay | Admitting: Neurology

## 2014-01-18 ENCOUNTER — Telehealth: Payer: Self-pay | Admitting: Internal Medicine

## 2014-01-18 NOTE — Telephone Encounter (Signed)
New message      Pt has a pacemaker----near her navel area she can feel a pulse beating.  Is this normal?  OK to call back friday

## 2014-01-22 NOTE — Telephone Encounter (Signed)
Pt feels pulsing in [place] area when laying down while pushing on a "firm" spot/place. Pt does not feel pulsing feelings unless she presses on it or searches for it. I had pt send extra transmission. All diagnostics normal. I advised pt to contact her PCP.

## 2014-01-23 ENCOUNTER — Telehealth: Payer: Self-pay | Admitting: Internal Medicine

## 2014-01-23 NOTE — Telephone Encounter (Signed)
Patient has a small knot next to her umbilicus.  She denies any complaints with the exception of "gurgling" noises in her abdomen. She specifically denies abdominal pain, fever, diarrhea, constipation, or rectal bleeding.   She is asked to contact her primary care for her concerns of a knot in her abdomen.  She will call back for any GI concerns

## 2014-01-26 ENCOUNTER — Encounter: Payer: Self-pay | Admitting: Internal Medicine

## 2014-01-26 ENCOUNTER — Telehealth: Payer: Self-pay | Admitting: Nurse Practitioner

## 2014-01-26 NOTE — Telephone Encounter (Signed)
Patient's spouse called concerned that patient is losing weight and contributes it to donepezil (ARICEPT) 10 MG tablet.  Patient is very concerned and would like an appointment to discuss further.  Please call and advise anytime, and if not available may leave message on vm.  Thanks

## 2014-01-26 NOTE — Telephone Encounter (Signed)
I called back and spoke with spouse.  They are trying to get an appt ASAP due to weight loss.  I am unable to schedule appts.  Patient has not experienced vomiting, but has had some diarrhea (common side effect from this drug).  [facility] does not have any openings ASAP at the present time.

## 2014-01-29 NOTE — Telephone Encounter (Signed)
Patient was notified and an appointment has been scheduled for 08/11 with Dr. Brett Fairy.

## 2014-01-30 ENCOUNTER — Encounter: Payer: Self-pay | Admitting: Neurology

## 2014-01-30 ENCOUNTER — Ambulatory Visit (INDEPENDENT_AMBULATORY_CARE_PROVIDER_SITE_OTHER): Payer: Medicare Other | Admitting: Neurology

## 2014-01-30 VITALS — BP 115/67 | HR 64 | Resp 16 | Ht 68.5 in | Wt 135.0 lb

## 2014-01-30 DIAGNOSIS — F068 Other specified mental disorders due to known physiological condition: Secondary | ICD-10-CM

## 2014-01-30 DIAGNOSIS — F0390 Unspecified dementia without behavioral disturbance: Secondary | ICD-10-CM | POA: Insufficient documentation

## 2014-01-30 MED ORDER — DONEPEZIL HCL 23 MG PO TABS: 23.0000 mg | ORAL_TABLET | Freq: Every day | ORAL | Status: DC

## 2014-01-30 NOTE — Progress Notes (Signed)
[facility]  PATIENT: [name] DOB: 03-14-1935   REASON FOR VISIT: Followup for mild cognitive impairment. She has a history of dizziness and possible TIA and is an established patient with Dr. Leonie Man at [facility].     Interval history : Since [name]  was last seen by Nurse Practitioner Cecille Rubin on 07-07-13 she had some additional weight loss.  She also has continued to have memory issues that seem to have been progressing. She scored  on a mini mental status examination in January 2015 27/30 points , she now scored on the same test 23/30. AFT was 17, she missed all word recall points, and the date and day of the week.  This would not be considered mild cognitive impairment any longer. This is very likely  early dementia. The patient could not remember that she had seen me before, she does not think that she lost weight - but she may have lost some appetite.  She states that she and her husband are often going to eat outside the home and that she is therefore no longer required to cook.  She does not need any help with dressing, bathing. She has not balance the checkbook the couple for long time and she does not longer drive. No falls and no fainting reported. The patient is unaccompanied to the office visit,  She feels that aricept has not caused any side effects.   Her husband requested a visit due to weight loss; January weight was 145 pounds in winter clothes, and today 135 pounds - in Summer . I will recommend a caloric drink such as carnation breakfast daily , or boost . She reports eating daily breakfast, and she goes a lot to [employer]. I would like her to take the boost etc as an adjunct , not a meal replacement.    HISTORY OF PRESENT ILLNESS: [name], 79 year old female returns for followup. She was initially evaluated 03/22/2013 by Dr. Brett Fairy for memory loss. Her EEG after the visit was normal. Dementia labs return normal. Ct  of the head without change from last in Jan 2011. She is currently taking Aricept 10 mg daily and denies any side effects. She claims she exercises by walking every day. She does minimal cooking. She is able to perform her activities of daily living such as dressing bathing etc. She does not do the finances. She lives with her husband. She does not drive.    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  The patient has a history of depression, hypothyroidism, possible pre-diabetes. Dr. Renold Genta is concerned about possible early dementia. The patient also states that she has a pacemaker has a history of a first degree A V. block. She'll has a history of dizziness and possible TIA and is an established patient with Dr. Leonie Man at [facility].  She had transient left-sided weakness when he saw her in 2011. In March 2011, she underwent an MRI of the brain ( ordering physician was Dr. Brett Fairy) with no evidence of tumor or infarct or bleed. Evident was chronic microvascular ischemia ,but this was considered age related ( Dr Leonie Man interpreted) .  The evaluation for possible stroke or TIA in March 2011 followed a history of a sudden fall within the confines of her home.  She had walked  from the living room to the kitchen and fell between the coffee table and the sofa. Apparently,  there was a second episode when she fell suddenly to the right. The second episode was associated with numbness of the left side. 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 A cardiac monitoring revealed AV block as the possible cause for her falls. She has seen Dr. Crissie Sickles twice 2011 year for syncope with a sick sinus syndrome ( but states she" hadn't fainted " ).  She has a Diplomatic Services operational officer since ? (" i don't know when-  its been a while " )  Meanwhile the patient feels that her memory has never been that much better and that in all functions have been taking away from her- she never needed to keep the financial books.  She feels, that she is performrng at the same level as last year or 2 years ago.  Extensive laboratory results were already performed by her primary care physician and reviewed today, she has no problems with sleep, she has no nocturnal hallucinations she reports normal light terrors or REM behavior disorder, she also had no recent falls. There is no parkinsonism, shuffling gait, urinary incontinence.      REVIEW OF SYSTEMS: Full 14 system review of systems performed and notable only for those listed, all others are neg:  GDS endorsed at 1 point. She asked about depression.  Constitutional: N/A  Cardiovascular: N/A  Ear/Nose/Throat: Ringing in the ears Skin: N/A  Eyes: N/A  Respiratory: N/A  Gastroitestinal: loss of apetite.  Genitourinary: Frequency of urine Hematology/Lymphatic: N/A  Endocrine: N/A Musculoskeletal: Joint pain, back pain Allergy/Immunology: N/A  Neurological: Memory loss - progressive.  Psychiatric: GDS 1 point, gained weight on antidepressants, was taken off.    ALLERGIES: Allergies  Allergen Reactions  . Sulfa Antibiotics Diarrhea and Rash  . Sulfasalazine Diarrhea and Rash    HOME MEDICATIONS: Outpatient Prescriptions Prior to Visit  Medication Sig Dispense Refill  . Cholecalciferol (VITAMIN D-3 PO) Take 1 capsule by mouth daily.      Marland Kitchen donepezil (ARICEPT) 10 MG tablet TAKE 1 TABLET BY MOUTH AT BEDTIME  90 tablet  0  . Lactobacillus (ACIDOPHOLUS PO) Take 1 tablet by mouth daily.       Marland Kitchen levothyroxine (SYNTHROID, LEVOTHROID) 25 MCG tablet As directed      . methylcellulose (ARTIFICIAL TEARS) 1 % ophthalmic solution Place 1 drop into both eyes as needed (eye irritation).      . Multiple Vitamins-Minerals (MULTIVITAMIN WITH MINERALS) tablet Take 1 tablet by  mouth daily.        Marland Kitchen donepezil (ARICEPT) 10 MG tablet Take 1 tablet (10 mg total) by mouth daily.  90 tablet  1  . OVER THE COUNTER MEDICATION Thyroid medicine       No facility-administered medications prior to visit.    PAST MEDICAL HISTORY: Past Medical History  Diagnosis Date  . Hyperlipidemia   . Thyroid disease   . Arthritis   . Asthma   . Depression   . Colon polyps   . Environmental allergies   . Complete heart block -intermittent     a. 11/2012 s/p SJM [facility] DR RF dc ppm.  . Osteoarthritis     hands  . Memory retention disorder 03/22/2013    PAST SURGICAL HISTORY: Past Surgical History  Procedure Laterality Date  . Knee surgery    . Salpingoophorectomy      right  .  Appendectomy  1965  . Colonoscopy  2007    adenomatous polyps  . Insert / replace / remove pacemaker  12/02/2012     Dr Lovena Le  . Hand surgery    . Ovarian cystectomy  1965  . Tumor removed  1985    abdomen  . Cervical polypectomy  1996    FAMILY HISTORY: Family History  Problem Relation Age of Onset  . Colon cancer Father   . Colon polyps Brother   . Diabetes Brother     maternal aunt  . Coronary artery disease Brother     SOCIAL HISTORY: History   Social History  . Marital Status: Married    Spouse Name: [name]    Number of Children: 3  . Years of Education: College   Occupational History  . elem. school teacher     retired   Social History Main Topics  . Smoking status: Never Smoker   . Smokeless tobacco: Never Used  . Alcohol Use: Yes     Comment: occas. which is seldom  . Drug Use: No  . Sexual Activity: Not on file   Other Topics Concern  . Not on file   Social History Narrative   Patient has one daughter, two adopted children(son,daughter).   Patient lives at home with spouse [name]).   Patient is retired.   Patient has a college education.   Patient is right-handed.   Patient drinks very little caffeine.     PHYSICAL EXAM  Filed Vitals:   01/30/14  1550  BP: 115/67  Pulse: 64  Resp: 16  Height: 5' 8.5" (1.74 m)  Weight: 135 lb (61.236 kg)   Body mass index is 20.23 kg/(m^2).  Generalized: Well developed, well groomed in no acute distress  Head: normocephalic and atraumatic,. Oropharynx benign  Neck: Supple,  Cardiac: Regular rate rhythm,  Musculoskeletal: No deformity   Neurological examination   Mentation: Alert oriented to time, place, history taking.MMSE 23/30.  Lower score than January ,.AFT 17- better than January.  Follows all commands speech and language fluent  Cranial nerve II-XII: Pupils were equal round reactive to light extraocular movements were full, visual field were full on confrontational test. Facial sensation and strength were normal. hearing was intact to finger rubbing bilaterally. Uvula tongue midline. Head turning and shoulder shrug were normal and symmetric.Tongue protrusion into cheek strength was normal. Motor: normal bulk and tone, full strength in the BUE, BLE, No focal weakness Sensory: normal and symmetric to light touch, pinprick, and  vibration  Coordination: finger-nose-finger, heel-to-shin bilaterally, no dysmetria Reflexes: 2/2, plantar responses were flexor bilaterally. Gait and Station: Rising up from seated position without assistance, normal stance,  moderate stride, good arm swing, smooth turning, able to perform tiptoe, and heel walking without difficulty. Tandem gait is steady, no assistive device  DIAGNOSTIC DATA (LABS, IMAGING, TESTING) - I reviewed patient records, labs, notes, testing and imaging myself where available.  Lab Results  Component Value Date   WBC 4.9 10/17/2013   HGB 13.2 10/17/2013   HCT 39.1 10/17/2013   MCV 94.2 10/17/2013   PLT 198.0 10/17/2013      Component Value Date/Time   NA 137 10/17/2013 1119   K 3.7 10/17/2013 1119   CL 103 10/17/2013 1119   CO2 26 10/17/2013 1119   GLUCOSE 73 10/17/2013 1119   BUN 21 10/17/2013 1119   CREATININE 0.7 10/17/2013 1119    CREATININE 0.94 12/20/2012 0925   CALCIUM 9.3 10/17/2013 1119   PROT 6.4  10/17/2013 1119   ALBUMIN 3.9 10/17/2013 1119   AST 28 10/17/2013 1119   ALT 20 10/17/2013 1119   ALKPHOS 55 10/17/2013 1119   BILITOT 0.6 10/17/2013 1119   GFRNONAA 69* 11/26/2012 0920   GFRAA 80* 11/26/2012 0920   Lab Results  Component Value Date   CHOL 191 10/17/2013   HDL 74.50 10/17/2013   LDLCALC 106* 10/17/2013   LDLDIRECT 127.6 04/21/2013   TRIG 53.0 10/17/2013   CHOLHDL 3 10/17/2013    Lab Results  Component Value Date   TSH 1.47 10/17/2013   ASSESSMENT AND PLAN  79 y.o. year old female  has a past medical history of Hyperlipidemia; Thyroid disease; Arthritis; Depression; Complete heart block -intermittent; Osteoarthritis; and Memory retention disorder (03/22/2013). here to follow up. EEG was normal. Dementia labs returned normal. CT of the head performed 04/12/2013 without change from January 2011.  Weight loss of 6 to 10 pounds, add carnation or Boost once a day to meals.   Memory score is declined : Continue Aricept  Will try 28 mg dose daily-F/U in [place], [place], [facility], APRN    Larey Seat, MD  [facility] [address] [place], [place] [address] [phone]

## 2014-01-30 NOTE — Patient Instructions (Signed)
1) weight loss  on Aricept,   I would like for this patient to try Carnation breakfast or boost  once a day as an adjunct to her regular diet not not as a meal replacement. 2) memory loss   has mildly progressed : the patient scored 27/30 points in her memory test in January and is now at 23 of 30 points. At the same time animal fluency testing has improved from 11 words to 17 words.  alzheimer's dementia is the most common reason for this development . Start aricept 28 mg daily.

## 2014-02-01 NOTE — Telephone Encounter (Signed)
Patient's husband calling to ask questions about patient's new Aricept dosage increase, please return his call and advise at [phone].

## 2014-02-01 NOTE — Telephone Encounter (Signed)
I called back.  Spouse wanted to know if Rx had been sent.  Advised Rx was sent to [facility] at [place] on 08/11.  They will follow up with the pharmacy and call us back if needed.

## 2014-02-14 ENCOUNTER — Telehealth: Payer: Self-pay | Admitting: Cardiology

## 2014-02-14 NOTE — Telephone Encounter (Signed)
New problem   Pt's spouse stated pt's heart is beating down near her navel and would like to speak to nurse concerning this matter.

## 2014-02-14 NOTE — Telephone Encounter (Signed)
Patient wanted to see  Dr. Mare Ferrari for ov soon, scheduled appointment for Monday. Patient aware

## 2014-02-16 ENCOUNTER — Telehealth: Payer: Self-pay | Admitting: Neurology

## 2014-02-16 NOTE — Telephone Encounter (Signed)
Spoke to patient. May look at PCP for more information, I believe one Boost is plenty, and at least she is not losing weight.   Would she like to eat additional treats, such a ice cream or other high caloric foods?  She responded that she just ate at  [employer] and still sees no change on her scale. ..she sees PCP on Monday.  CD

## 2014-02-16 NOTE — Telephone Encounter (Signed)
Patient is drinking the Boost her weight has not changed do you have any other recommendations that she can do for nutrition.  Please call and leave a message if no answer.

## 2014-02-16 NOTE — Telephone Encounter (Signed)
Patient's husband calling to state that she is taking the Boost. She is not losing weight but she has not gained any weight either. He would like to know what else they can do for nutrition.  Best number to call is [phone] and okay to leave a message

## 2014-02-19 ENCOUNTER — Encounter: Payer: Self-pay | Admitting: Cardiology

## 2014-02-19 ENCOUNTER — Ambulatory Visit
Admission: RE | Admit: 2014-02-19 | Discharge: 2014-02-19 | Disposition: A | Discharge status: Home / Self Care | Payer: Medicare Other | Source: Ambulatory Visit | Attending: Cardiology | Admitting: Cardiology

## 2014-02-19 ENCOUNTER — Ambulatory Visit (INDEPENDENT_AMBULATORY_CARE_PROVIDER_SITE_OTHER): Payer: Medicare Other | Admitting: Cardiology

## 2014-02-19 VITALS — BP 100/56 | HR 68 | Ht 67.5 in | Wt 132.0 lb

## 2014-02-19 DIAGNOSIS — R0602 Shortness of breath: Secondary | ICD-10-CM

## 2014-02-19 DIAGNOSIS — R634 Abnormal weight loss: Secondary | ICD-10-CM

## 2014-02-19 DIAGNOSIS — F0390 Unspecified dementia without behavioral disturbance: Secondary | ICD-10-CM

## 2014-02-19 DIAGNOSIS — F068 Other specified mental disorders due to known physiological condition: Secondary | ICD-10-CM

## 2014-02-19 DIAGNOSIS — I442 Atrioventricular block, complete: Secondary | ICD-10-CM

## 2014-02-19 DIAGNOSIS — R413 Other amnesia: Secondary | ICD-10-CM

## 2014-02-19 NOTE — Assessment & Plan Note (Signed)
Has dementia and is on generic Aricept (donepezil).  Her dementia symptoms appear to be stable.

## 2014-02-19 NOTE — Assessment & Plan Note (Signed)
The patient has lost 8 more pounds since last visit.  She has tried drinking boost but is too sweet for her.  She does not sleep well at night.  Nocturia keeps her awake.  She has urinary frequency.  She has not seen a urologist.  Her stools are often loose at times.

## 2014-02-19 NOTE — Assessment & Plan Note (Signed)
The patient has had no further dizziness or syncope since her pacemaker.

## 2014-02-19 NOTE — Patient Instructions (Signed)
START MUCINEX 600 MG (PLAIN) TWICE A DAY AS NEEDED FOR COUGH/CONGESTION  A chest x-ray takes a picture of the organs and structures inside the chest, including the heart, lungs, and blood vessels. This test can show several things, including, whether the heart is enlarges; whether fluid is building up in the lungs; and whether pacemaker / defibrillator leads are still in place. [place] at the [facility]  Your physician wants you to follow-up in: 6 month ov You will receive a reminder letter in the mail two months in advance. If you don't receive a letter, please call our office to schedule the follow-up appointment.

## 2014-02-19 NOTE — Progress Notes (Signed)
[name] Date of Birth:  01/24/1935 [name] [address] [place], [place]  [address] [phone]        Fax   [phone]   History of Present Illness: This 79 year old woman is seen for a scheduled followup office visit. She has a history of intermittent high-grade AV block and has a functioning dual-chamber pacemaker. She is followed for this by Dr. Lovena Osborn. She has a history of hypercholesterolemia. She has a history of osteoarthritis. She has also had some memory issues and is followed by Dr. Brett Osborn. Since last visit she has had no new cardiac symptoms. She has not had any dizziness or syncope. No chest pain.  She has had unintentional weight loss.  She denies any chest pain but has had shortness of breath.  Her last chest x-ray was in June 2014 and we will update her chest x-ray.  She has been having problems with nocturia and problems with cough and throat and chest congestion.  Her medical doctor is Dr. Renold Osborn.   Current Outpatient Prescriptions  Medication Sig Dispense Refill  . Cholecalciferol (VITAMIN D-3 PO) Take 1 capsule by mouth daily.      Marland Kitchen donepezil (ARICEPT) 23 MG TABS tablet Take 1 tablet (23 mg total) by mouth at bedtime.  30 tablet  5  . guaiFENesin (MUCINEX) 600 MG 12 hr tablet Take 600 mg by mouth 2 (two) times daily as needed for cough.      . Lactobacillus (ACIDOPHOLUS PO) Take 1 tablet by mouth daily.       Marland Kitchen levothyroxine (SYNTHROID, LEVOTHROID) 25 MCG tablet As directed      . methylcellulose (ARTIFICIAL TEARS) 1 % ophthalmic solution Place 1 drop into both eyes as needed (eye irritation).      . Multiple Vitamins-Minerals (MULTIVITAMIN WITH MINERALS) tablet Take 1 tablet by mouth daily.         No current facility-administered medications for this visit.    Allergies  Allergen Reactions  . Sulfa Antibiotics Diarrhea and Rash  . Sulfasalazine Diarrhea and Rash    Patient Active Problem List   Diagnosis Date Noted  .  Weight loss, unintentional 02/19/2014  . Dementia arising in the senium and presenium 01/30/2014  . 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 Memory loss 07/07/2013  . Memory retention disorder 03/22/2013  . Pacemaker 03/09/2013  . Complete heart block -intermittent   . Syncope 11/18/2012  . Personal history of adenomatous colonic polyps 11/12/2011  . History of TIA (transient ischemic attack) 09/19/2011  . Hypothyroidism 01/13/2011  . Hyperlipidemia 01/13/2011  . First degree AV block 01/13/2011  . Gait disturbance 01/13/2011    History  Smoking status  . Never Smoker   Smokeless tobacco  . Never Used    History  Alcohol Use  . Yes    Comment: occas. which is seldom    Family History  Problem Relation Age of Onset  . Colon cancer Father   . Colon polyps Brother   . Diabetes Brother     maternal aunt  . Coronary artery disease Brother     Review of Systems: Constitutional: no fever chills diaphoresis or fatigue or change in weight.  Head and neck: no hearing loss, no epistaxis, no photophobia or visual disturbance. Respiratory: No cough, shortness of breath or wheezing. Cardiovascular: No chest pain peripheral edema, palpitations. Gastrointestinal: No abdominal distention, no abdominal pain, no change in bowel habits hematochezia or melena.  Genitourinary: No dysuria, no frequency, no urgency, no nocturia. Musculoskeletal:No arthralgias, no back pain, no gait disturbance or myalgias. Neurological: No dizziness, no headaches, no numbness, no seizures, no syncope, no weakness, no tremors. Hematologic: No lymphadenopathy, no easy bruising. Psychiatric: No confusion, no hallucinations, no sleep disturbance.    Physical Exam: Filed Vitals:   02/19/14 0959  BP: 100/56  Pulse: 68  The patient appears to be in no distress.  Head and neck exam reveals that the pupils are equal and reactive.  The extraocular movements  are full.  There is no scleral icterus.  Mouth and pharynx are benign.  No lymphadenopathy.  No carotid bruits.  The jugular venous pressure is normal.  Thyroid is not enlarged or tender.  Chest is clear to percussion and auscultation.  No rales or rhonchi.  Expansion of the chest is symmetrical.  Pacemaker in left upper anterior chest wall.  Heart reveals no abnormal lift or heave.  First and second heart sounds are normal.  There is no murmur gallop rub or click.  The abdomen is soft and nontender.  Bowel sounds are normoactive.  There is no hepatosplenomegaly or mass.  There are no abdominal bruits.  Extremities reveal no phlebitis or edema.  Pedal pulses are good.  There is no cyanosis or clubbing.  Neurologic exam is normal strength and no lateralizing weakness.  No sensory deficits.  Integument reveals no rash  EKG shows AV pacing  Assessment / Plan: 1. intermittent heart block with functioning dual-chamber pacemaker 2. unintentional weight loss. 3. Dementia 4. hypothyroidism, on Synthroid 5. chest congestion and nonproductive cough  Plan: For cough and congestion she will take plain Mucinex 600 mg twice a day when necessary We will update her chest x-ray I encouraged her to talk with her PCP about her urinary frequency and nocturia to see whether she might benefit from seeing a urologist. Recheck here in 6 months for office visit.

## 2014-02-20 ENCOUNTER — Ambulatory Visit: Payer: Medicare Other | Admitting: Nurse Practitioner

## 2014-02-27 ENCOUNTER — Encounter: Payer: Self-pay | Admitting: Internal Medicine

## 2014-02-27 ENCOUNTER — Ambulatory Visit (INDEPENDENT_AMBULATORY_CARE_PROVIDER_SITE_OTHER): Payer: Medicare Other | Admitting: *Deleted

## 2014-02-27 DIAGNOSIS — I495 Sick sinus syndrome: Secondary | ICD-10-CM

## 2014-02-27 DIAGNOSIS — I442 Atrioventricular block, complete: Secondary | ICD-10-CM

## 2014-02-27 LAB — MDC_IDC_ENUM_SESS_TYPE_REMOTE
Battery Remaining Longevity: 108 mo
Brady Statistic RV Percent Paced: 37 %
Lead Channel Impedance Value: 390 Ohm
Lead Channel Impedance Value: 460 Ohm
Lead Channel Sensing Intrinsic Amplitude: 11.3 mV
Lead Channel Sensing Intrinsic Amplitude: 3.2 mV
Lead Channel Setting Pacing Amplitude: 2.5 V
Lead Channel Setting Sensing Sensitivity: 2 mV
MDC IDC MSMT BATTERY VOLTAGE: 3.01 V
MDC IDC PG SERIAL: 7474456
MDC IDC SET LEADCHNL RA PACING AMPLITUDE: 2 V
MDC IDC SET LEADCHNL RV PACING PULSEWIDTH: 0.5 ms
MDC IDC STAT BRADY RA PERCENT PACED: 20 %

## 2014-02-27 NOTE — Progress Notes (Signed)
Remote pacemaker transmission.   

## 2014-03-01 ENCOUNTER — Encounter: Payer: Self-pay | Admitting: Cardiology

## 2014-03-05 ENCOUNTER — Other Ambulatory Visit: Payer: Self-pay | Admitting: Internal Medicine

## 2014-03-05 ENCOUNTER — Encounter: Payer: Self-pay | Admitting: Internal Medicine

## 2014-03-05 ENCOUNTER — Ambulatory Visit (INDEPENDENT_AMBULATORY_CARE_PROVIDER_SITE_OTHER): Payer: Medicare Other | Admitting: Internal Medicine

## 2014-03-05 VITALS — BP 118/72 | HR 60 | Temp 97.2°F | Wt 135.0 lb

## 2014-03-05 VITALS — BP 118/82 | HR 60 | Temp 97.2°F | Resp 12 | Wt 135.0 lb

## 2014-03-05 DIAGNOSIS — R413 Other amnesia: Secondary | ICD-10-CM

## 2014-03-05 DIAGNOSIS — R350 Frequency of micturition: Secondary | ICD-10-CM

## 2014-03-05 DIAGNOSIS — J309 Allergic rhinitis, unspecified: Secondary | ICD-10-CM

## 2014-03-05 DIAGNOSIS — E785 Hyperlipidemia, unspecified: Secondary | ICD-10-CM

## 2014-03-05 DIAGNOSIS — E039 Hypothyroidism, unspecified: Secondary | ICD-10-CM

## 2014-03-05 DIAGNOSIS — Z23 Encounter for immunization: Secondary | ICD-10-CM

## 2014-03-05 DIAGNOSIS — R351 Nocturia: Secondary | ICD-10-CM

## 2014-03-05 DIAGNOSIS — Z0000 Encounter for general adult medical examination without abnormal findings: Secondary | ICD-10-CM

## 2014-03-05 DIAGNOSIS — Z950 Presence of cardiac pacemaker: Secondary | ICD-10-CM

## 2014-03-05 LAB — CBC WITH DIFFERENTIAL/PLATELET
Basophils Absolute: 0 10*3/uL (ref 0.0–0.1)
Basophils Relative: 0 % (ref 0–1)
EOS ABS: 0.1 10*3/uL (ref 0.0–0.7)
EOS PCT: 2 % (ref 0–5)
HEMATOCRIT: 40.4 % (ref 36.0–46.0)
Hemoglobin: 13.9 g/dL (ref 12.0–15.0)
LYMPHS PCT: 21 % (ref 12–46)
Lymphs Abs: 1.2 10*3/uL (ref 0.7–4.0)
MCH: 31.4 pg (ref 26.0–34.0)
MCHC: 34.4 g/dL (ref 30.0–36.0)
MCV: 91.4 fL (ref 78.0–100.0)
MONO ABS: 0.7 10*3/uL (ref 0.1–1.0)
MONOS PCT: 13 % — AB (ref 3–12)
Neutro Abs: 3.6 10*3/uL (ref 1.7–7.7)
Neutrophils Relative %: 64 % (ref 43–77)
Platelets: 198 10*3/uL (ref 150–400)
RBC: 4.42 MIL/uL (ref 3.87–5.11)
RDW: 13.7 % (ref 11.5–15.5)
WBC: 5.6 10*3/uL (ref 4.0–10.5)

## 2014-03-05 LAB — COMPREHENSIVE METABOLIC PANEL
ALT: 29 U/L (ref 0–35)
AST: 31 U/L (ref 0–37)
Albumin: 4.3 g/dL (ref 3.5–5.2)
Alkaline Phosphatase: 71 U/L (ref 39–117)
BUN: 24 mg/dL — ABNORMAL HIGH (ref 6–23)
CALCIUM: 9.2 mg/dL (ref 8.4–10.5)
CHLORIDE: 103 meq/L (ref 96–112)
CO2: 29 meq/L (ref 19–32)
Creat: 0.86 mg/dL (ref 0.50–1.10)
GLUCOSE: 75 mg/dL (ref 70–99)
Potassium: 4 mEq/L (ref 3.5–5.3)
Sodium: 138 mEq/L (ref 135–145)
Total Bilirubin: 0.5 mg/dL (ref 0.2–1.2)
Total Protein: 6.5 g/dL (ref 6.0–8.3)

## 2014-03-05 LAB — POCT URINALYSIS DIPSTICK
Bilirubin, UA: NEGATIVE
Glucose, UA: NEGATIVE
KETONES UA: NEGATIVE
Leukocytes, UA: NEGATIVE
NITRITE UA: NEGATIVE
Protein, UA: NEGATIVE
Spec Grav, UA: 1.01
Urobilinogen, UA: NEGATIVE
pH, UA: 5

## 2014-03-05 MED ORDER — PNEUMOCOCCAL 13-VAL CONJ VACC IM SUSP: 0.5000 mL | Freq: Once | INTRAMUSCULAR | Status: DC

## 2014-03-05 MED ORDER — TOLTERODINE TARTRATE 2 MG PO TABS: ORAL_TABLET | ORAL | Status: DC

## 2014-03-05 NOTE — Patient Instructions (Addendum)
Take Detrol once or twice daily for urinary frequency. Labs are pending.

## 2014-03-05 NOTE — Patient Instructions (Signed)
Try Detrol LA once or twice daily. Continue other meds. Labs drawn Flu vaccine given.

## 2014-03-05 NOTE — Progress Notes (Signed)
   Subjective:    Patient ID: [name], female    DOB: 1935-03-12, 79 y.o.   MRN: [id number]  HPI  Last seen here July 2014. Followed by Neurology for memory loss and is on Aricept. Dr. Mare Ferrari is Cardiologist. Has a pacemaker. Longstanding history of urinary frequency. No dysuria. Up at night about 3 times. Drinks a lot of fluid with meds. No incontinence. Has tried Kegel exercises without help.    Review of Systems     Objective:   Physical Exam  Vitals reviewed. Constitutional: She appears well-developed and well-nourished. No distress.  HENT:  Head: Normocephalic and atraumatic.  Right Ear: External ear normal.  Left Ear: External ear normal.  Mouth/Throat: Oropharynx is clear and moist. No oropharyngeal exudate.  Eyes: Conjunctivae are normal. Pupils are equal, round, and reactive to light. Right eye exhibits no discharge. Left eye exhibits no discharge. No scleral icterus.  Neck: Neck supple. No JVD present. No thyromegaly present.  No bruits  Cardiovascular: Normal rate, regular rhythm, normal heart sounds and intact distal pulses.   No murmur heard. Pulmonary/Chest: Effort normal and breath sounds normal. No respiratory distress. She has no wheezes. She has no rales. She exhibits no tenderness.  Abdominal: Soft. Bowel sounds are normal. She exhibits no distension and no mass. There is no tenderness. There is no rebound and no guarding.  Musculoskeletal: Normal range of motion. She exhibits no edema.  Neurological: She is alert. She has normal reflexes. She displays normal reflexes. No cranial nerve deficit. Coordination normal.  Cannot remember day of week,knows year, and month  Skin: Skin is warm and dry. No rash noted. She is not diaphoretic.  Psychiatric: She has a normal mood and affect. Her behavior is normal. Judgment and thought content normal.          Assessment & Plan:  Nocturia and urinary frequency

## 2014-03-06 LAB — FOLATE: Folate: >20 ng/mL

## 2014-03-06 LAB — HEMOGLOBIN A1C
Hgb A1c MFr Bld: 5.9 % — ABNORMAL HIGH (ref <5.7)
Mean Plasma Glucose: 123 mg/dL — ABNORMAL HIGH (ref <117)

## 2014-03-06 LAB — VITAMIN B12: VITAMIN B 12: 460 pg/mL (ref 211–911)

## 2014-03-06 LAB — TSH: TSH: 2.861 u[IU]/mL (ref 0.350–4.500)

## 2014-03-07 LAB — URINE CULTURE
Colony Count: NO GROWTH
Organism ID, Bacteria: NO GROWTH

## 2014-03-18 ENCOUNTER — Encounter: Payer: Self-pay | Admitting: Internal Medicine

## 2014-03-18 DIAGNOSIS — R1900 Intra-abdominal and pelvic swelling, mass and lump, unspecified site: Secondary | ICD-10-CM | POA: Insufficient documentation

## 2014-03-18 NOTE — Progress Notes (Signed)
Subjective:    Patient ID: [name], female    DOB: Jul 14, 1934, 79 y.o.   MRN: [id number]  HPI  79 year old White Female in today for health maintenance exam and evaluation of medical issues. Sees Dr. Mare Ferrari as she has a pacemaker. She sees Neurology for memory loss. Long-standing history of urinary frequency and nocturia. She is up at night about 3 times. Drinks a lot of fluid with meds. No incontinence. Has tried Kegel exercises without help.  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 Lovena Le. History of hyperlipidemia. History of osteoarthritis. Memory loss followed by Dr. Estella Husk. She takes Aricept. History of hypothyroidism. She takes fish oil.  Weight  On August 31 a cardiology office was 132 pounds and is now 135 pounds.  Patient had colonoscopy in may 2013 by Dr. Carlean Purl showing a hyperplastic polyp. However in 2007 she had colonoscopy by Dr. Penelope Coop and had 2 tubular adenomas removed.  Dr. Kathrin Penner is her eye physician.  She's had Pneumovax immunization in the past and has had Zostavax vaccine if pharmacy.  In 1996 she had a polyp removed from her cervical os. Had right ovarian cystectomy and appendectomy in 1965.  Tetanus update given July 2011.  Social history: She is a retired Chief Technology Officer. Husband is a retired Merchant navy officer. Patient taught second and third grade for 30 years. Rare alcohol consumption. Nonsmoker. 3 children: one son and one daughter adopted. They have one daughter who is their natural child.  Family history: Father died from colon cancer at age 91. Mother died from a colon blockage after surgery at age 90. Brother has had coronary artery disease with stent placement.  At one point she saw Dr. Lajuana Ripple  for depression and was on Paxil for about a year in 1997. Has been to whenever OB/GYN for Pap smears and GYN care.  In 2001 she had a reconstruction of  left thumb CMC joint by Dr.Sypher       Review of Systems admits to issues with memory loss and urinary frequency. No chest pain. Appetite fair. Complains of some postnasal drip for which she's been taking Mucinex. No fever or chills.     Objective:   Physical Exam  Vitals reviewed. Constitutional: She appears well-developed and well-nourished. No distress.  HENT:  Head: Normocephalic and atraumatic.  Right Ear: External ear normal.  Nose: Nose normal.  Mouth/Throat: Oropharynx is clear and moist. No oropharyngeal exudate.  Eyes: Conjunctivae and EOM are normal. Pupils are equal, round, and reactive to light. Right eye exhibits no discharge. Left eye exhibits no discharge. No scleral icterus.  Neck: Neck supple. No JVD present. No thyromegaly present.  Cardiovascular: Normal rate, regular rhythm, normal heart sounds and intact distal pulses.   No murmur heard. Pulmonary/Chest: Effort normal and breath sounds normal. No respiratory distress. She has no wheezes. She has no rales.  Breasts normal female  Abdominal: Soft. Bowel sounds are normal. There is no tenderness. There is no rebound.  Genitourinary:  Deferred to GYN  Musculoskeletal: She exhibits no edema.  Lymphadenopathy:    She has no cervical adenopathy.  Neurological: She is alert.  Knows month and year but not day of the week  Skin: Skin is warm and dry. No rash noted. She is not diaphoretic.  Psychiatric: She has a normal mood and affect. Her behavior is normal. Judgment normal.          Assessment & Plan:  Memory  loss-followed by Neurology  History of pacemaker-followed by Cardiology  Hyperlipidemia-takes fish oil  Hypothyroidism-on thyroid replacement therapy  Urinary frequency-prescribed Detrol to take once or twice a day. Suspect it is related to fluid intake as much as overactive bladder.Urine cultured.  Allergic rhinitis  Health maintenance: Have ordered mammogram and bone density study. Return in  one year or as needed.  Subjective:   Patient presents for Medicare Annual/Subsequent preventive examination.  Review Past Medical/Family/Social: see above   Risk Factors  Current exercise habits: sedentary Dietary issues discussed: low fat low carb  Cardiac risk factors: hyperlipdemia  Depression Screen  (Note: if answer to either of the following is "Yes", a more complete depression screening is indicated)   Over the past two weeks, have you felt down, depressed or hopeless? No  Over the past two weeks, have you felt little interest or pleasure in doing things? No Have you lost interest or pleasure in daily life? No Do you often feel hopeless? No Do you cry easily over simple problems? No   Activities of Daily Living  In your present state of health, do you have any difficulty performing the following activities?:   Driving? Does not drive Managing money? Does not do this-husband does Feeding yourself? No  Getting from bed to chair? No  Climbing a flight of stairs? No  Preparing food and eating?: No  Bathing or showering? No  Getting dressed: No  Getting to the toilet? No  Using the toilet:No  Moving around from place to place: No  In the past year have you fallen or had a near fall?:No  Are you sexually active? No  Do you have more than one partner? No   Hearing Difficulties: No  Do you often ask people to speak up or repeat themselves? No  Do you experience ringing or noises in your ears? No  Do you have difficulty understanding soft or whispered voices? No  Do you feel that you have a problem with memory? yes Do you often misplace items? sometimes   Home Safety:  Do you have a smoke alarm at your residence? Yes Do you have grab bars in the bathroom? Do you have throw rugs in your house?   Cognitive Testing  Alert? Yes Normal Appearance?Yes  Oriented to person? Yes Place? Yes  Time? Yes  Recall of three objects?  Can perform simple calculations? Yes    Displays appropriate judgment?Yes  Can read the correct time from a watch face?Yes   List the Names of Other Physician/Practitioners you currently use:  See referral list for the physicians patient is currently seeing.   Dr. Mare Ferrari Neurologist  Review of Systems: see above   Objective:     General appearance: Appears stated age Head: Normocephalic, without obvious abnormality, atraumatic  Eyes: conj clear, EOMi PEERLA  Ears: normal TM's and external ear canals both ears  Nose: Nares normal. Septum midline. Mucosa normal. No drainage or sinus tenderness.  Throat: lips, mucosa, and tongue normal; teeth and gums normal  Neck: no adenopathy, no carotid bruit, no JVD, supple, symmetrical, trachea midline and thyroid not enlarged, symmetric, no tenderness/mass/nodules  No CVA tenderness.  Lungs: clear to auscultation bilaterally  Breasts: normal appearance, no masses or tenderness, top of the pacemaker on left upper chest. Incision well-healed.  Heart: regular rate and rhythm, S1, S2 normal, no murmur, click, rub or gallop  Abdomen: soft, non-tender; bowel sounds normal; no masses, no organomegaly  Musculoskeletal: ROM normal in all joints, no crepitus,  no deformity, Normal muscle strengthen. Back  is symmetric, no curvature. Skin: Skin color, texture, turgor normal. No rashes or lesions  Lymph nodes: Cervical, supraclavicular, and axillary nodes normal.  Neurologic: CN 2 -12 Normal, Normal symmetric reflexes. Normal coordination and gait  Psych: Alert & Oriented x 3, Mood appear stable.    Assessment:    Annual wellness medicare exam   Plan:    During the course of the visit the patient was educated and counseled about appropriate screening and preventive services including:   Annual mammogram Pneumovax given today     Patient Instructions (the written plan) was given to the patient.  Medicare Attestation  I have personally reviewed:  The patient's medical and  social history  Their use of alcohol, tobacco or illicit drugs  Their current medications and supplements  The patient's functional ability including ADLs,fall risks, home safety risks, cognitive, and hearing and visual impairment  Diet and physical activities  Evidence for depression or mood disorders  The patient's weight, height, BMI, and visual acuity have been recorded in the chart. I have made referrals, counseling, and provided education to the patient based on review of the above and I have provided the patient with a written personalized care plan for preventive services.

## 2014-03-23 ENCOUNTER — Telehealth: Payer: Self-pay | Admitting: Neurology

## 2014-03-23 NOTE — Telephone Encounter (Signed)
This can be handled by a nurse,   off course additional calories give higher glucose levels.  This patient had weight loss and needs some calories. Perhaps the boost can be diverted over 2 or 3 small portions ? I am unsure about a glucose neutral Boost or  Like product.

## 2014-03-23 NOTE — Telephone Encounter (Signed)
Patient's husband calling to state that patient was advised to drink Boost to help with her weight, but states that it is affecting her glucose. Please return call and advise.

## 2014-03-27 ENCOUNTER — Encounter: Payer: Self-pay | Admitting: Internal Medicine

## 2014-04-04 NOTE — Telephone Encounter (Signed)
LMVM for pt and husband that Dr. Brett Fairy not sure of the glucose neutral product or like product to help with calorie intake.  May see pcp for more assistance re: diet to increase calories.

## 2014-04-09 ENCOUNTER — Encounter: Payer: Self-pay | Admitting: Cardiology

## 2014-04-09 ENCOUNTER — Ambulatory Visit (INDEPENDENT_AMBULATORY_CARE_PROVIDER_SITE_OTHER): Payer: Medicare Other | Admitting: Cardiology

## 2014-04-09 VITALS — BP 118/62 | HR 60 | Ht 67.0 in | Wt 132.0 lb

## 2014-04-09 DIAGNOSIS — E7800 Pure hypercholesterolemia, unspecified: Secondary | ICD-10-CM

## 2014-04-09 DIAGNOSIS — I459 Conduction disorder, unspecified: Secondary | ICD-10-CM

## 2014-04-09 DIAGNOSIS — R413 Other amnesia: Secondary | ICD-10-CM

## 2014-04-09 DIAGNOSIS — I495 Sick sinus syndrome: Secondary | ICD-10-CM

## 2014-04-09 DIAGNOSIS — R634 Abnormal weight loss: Secondary | ICD-10-CM

## 2014-04-09 DIAGNOSIS — I442 Atrioventricular block, complete: Secondary | ICD-10-CM

## 2014-04-09 DIAGNOSIS — F0390 Unspecified dementia without behavioral disturbance: Secondary | ICD-10-CM

## 2014-04-09 DIAGNOSIS — F068 Other specified mental disorders due to known physiological condition: Secondary | ICD-10-CM

## 2014-04-09 NOTE — Assessment & Plan Note (Signed)
The patient has not had any recurrent dizziness or syncope since placement of the permanent pacemaker.

## 2014-04-09 NOTE — Assessment & Plan Note (Signed)
Her weight has stabilized.  Her appetite is satisfactory.  She had recent lab work at her PCP.

## 2014-04-09 NOTE — Assessment & Plan Note (Signed)
Her symptoms of dementia appeared to have stabilized since last visit

## 2014-04-09 NOTE — Patient Instructions (Signed)
Your physician recommends that you continue on your current medications as directed. Please refer to the Current Medication list given to you today.  Your physician wants you to follow-up in: 6 month ov/ekg You will receive a reminder letter in the mail two months in advance. If you don't receive a letter, please call our office to schedule the follow-up appointment.  

## 2014-04-09 NOTE — Progress Notes (Signed)
[name] Date of Birth:  02/14/1935 [place] [address] [place], [place]  [address] [phone]        Fax   [phone]   History of Present Illness: This 79 year old woman is seen for a scheduled followup office visit. She has a history of intermittent high-grade AV block and has a functioning St. Jude dual-chamber pacemaker. She is followed for this by Dr. Lovena Le. She has a history of hypercholesterolemia. She has a history of osteoarthritis. She has also had some memory issues and is followed by Dr. Brett Fairy. Since last visit she has had no new cardiac symptoms. She has not had any dizziness or syncope. No chest pain. She has had a past history of unintentional weight loss but since her last visit she has had no change in weight.  The patient had a chest x-ray on 02/19/14 which showed no abnormality. Her medical doctor is Dr. Renold Genta.   Current Outpatient Prescriptions  Medication Sig Dispense Refill  . Cholecalciferol (VITAMIN D-3 PO) Take 1 capsule by mouth daily.      Marland Kitchen donepezil (ARICEPT) 23 MG TABS tablet Take 1 tablet (23 mg total) by mouth at bedtime.  30 tablet  5  . guaiFENesin (MUCINEX) 600 MG 12 hr tablet Take 600 mg by mouth 2 (two) times daily as needed for cough.      . Lactobacillus (ACIDOPHOLUS PO) Take 1 tablet by mouth daily.       Marland Kitchen levothyroxine (SYNTHROID, LEVOTHROID) 25 MCG tablet As directed      . methylcellulose (ARTIFICIAL TEARS) 1 % ophthalmic solution Place 1 drop into both eyes as needed (eye irritation).      . Multiple Vitamins-Minerals (MULTIVITAMIN WITH MINERALS) tablet Take 1 tablet by mouth daily.        Marland Kitchen tolterodine (DETROL) 2 MG tablet Take one or two times daily q 12 hours for urinary frequency  60 tablet  0   Current Facility-Administered Medications  Medication Dose Route Frequency Provider Last Rate Last Dose  . pneumococcal 13-valent conjugate vaccine (PREVNAR 13) injection 0.5 mL  0.5 mL Intramuscular Once Elby Showers, MD        Allergies  Allergen Reactions  . Sulfa Antibiotics Diarrhea and Rash  . Sulfasalazine Diarrhea and Rash    Patient Active Problem List   Diagnosis Date Noted  . Weight loss, unintentional 02/19/2014  . Dementia arising in the senium and presenium 01/30/2014  . 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 Memory loss 07/07/2013  . Memory retention disorder 03/22/2013  . Pacemaker 03/09/2013  . Complete heart block -intermittent   . Syncope 11/18/2012  . Personal history of adenomatous colonic polyps 11/12/2011  . History of TIA (transient ischemic attack) 09/19/2011  . Hypothyroidism 01/13/2011  . Hyperlipidemia 01/13/2011  . First degree AV block 01/13/2011  . Gait disturbance 01/13/2011    History  Smoking status  . Never Smoker   Smokeless tobacco  . Never Used    History  Alcohol Use  . Yes    Comment: occas. which is seldom    Family History  Problem Relation Age of Onset  . Colon cancer Father   . Colon polyps Brother   . Diabetes Brother     maternal aunt  . Coronary artery disease Brother     Review of Systems: Constitutional: no fever chills diaphoresis or fatigue or change in weight.  Head and neck: no hearing  loss, no epistaxis, no photophobia or visual disturbance. Respiratory: No cough, shortness of breath or wheezing. Cardiovascular: No chest pain peripheral edema, palpitations. Gastrointestinal: No abdominal distention, no abdominal pain, no change in bowel habits hematochezia or melena. Genitourinary: No dysuria, no frequency, no urgency, no nocturia. Musculoskeletal:No arthralgias, no back pain, no gait disturbance or myalgias. Neurological: No dizziness, no headaches, no numbness, no seizures, no syncope, no weakness, no tremors. Hematologic: No lymphadenopathy, no easy bruising. Psychiatric: No confusion, no hallucinations, no sleep disturbance.    Physical Exam: Filed  Vitals:   04/09/14 1506  BP: 118/62  Pulse: 60  The patient appears to be in no distress.  Head and neck exam reveals that the pupils are equal and reactive.  The extraocular movements are full.  There is no scleral icterus.  Mouth and pharynx are benign.  No lymphadenopathy.  No carotid bruits.  The jugular venous pressure is normal.  Thyroid is not enlarged or tender.  Chest is clear to percussion and auscultation.  No rales or rhonchi.  Expansion of the chest is symmetrical.  Pacemaker in left upper chest.  Heart reveals no abnormal lift or heave.  First and second heart sounds are normal.  There is no murmur gallop rub or click.  The abdomen is soft and nontender.  Bowel sounds are normoactive.  There is no hepatosplenomegaly or mass.  There are no abdominal bruits.  Extremities reveal no phlebitis or edema.  Pedal pulses are good.  There is no cyanosis or clubbing.  Neurologic exam is normal strength and no lateralizing weakness.  No sensory deficits.  Integument reveals no rash    Assessment / Plan: 1.  Functioning St. Jude dual-chamber pacemaker for intermittent high-grade AV block. 2. memory disorder 3. hypercholesterolemia followed by Dr. Renold Genta 4. Hypothyroidism 5.  Unintentional weight loss, resolved  Disposition: Continue on same medication.  Recheck in 6 months for office visit and EKG

## 2014-05-28 ENCOUNTER — Encounter: Payer: Self-pay | Admitting: Internal Medicine

## 2014-05-31 ENCOUNTER — Ambulatory Visit (INDEPENDENT_AMBULATORY_CARE_PROVIDER_SITE_OTHER): Payer: Medicare Other | Admitting: *Deleted

## 2014-05-31 ENCOUNTER — Encounter (HOSPITAL_COMMUNITY): Payer: Self-pay | Admitting: Internal Medicine

## 2014-05-31 ENCOUNTER — Encounter: Payer: Self-pay | Admitting: Internal Medicine

## 2014-05-31 DIAGNOSIS — I442 Atrioventricular block, complete: Secondary | ICD-10-CM

## 2014-05-31 DIAGNOSIS — I495 Sick sinus syndrome: Secondary | ICD-10-CM

## 2014-05-31 NOTE — Progress Notes (Signed)
Remote pacemaker transmission.   

## 2014-06-05 ENCOUNTER — Ambulatory Visit: Payer: Medicare Other | Admitting: Nurse Practitioner

## 2014-06-09 ENCOUNTER — Encounter (HOSPITAL_COMMUNITY): Payer: Self-pay | Admitting: *Deleted

## 2014-06-09 ENCOUNTER — Emergency Department (HOSPITAL_COMMUNITY)
Admission: EM | Admit: 2014-06-09 | Discharge: 2014-06-09 | Disposition: A | Discharge status: Home / Self Care | Payer: Medicare Other | Source: Home / Self Care | Attending: Emergency Medicine | Admitting: Emergency Medicine

## 2014-06-09 DIAGNOSIS — R252 Cramp and spasm: Secondary | ICD-10-CM

## 2014-06-09 LAB — POCT I-STAT, CHEM 8
BUN: 36 mg/dL — ABNORMAL HIGH (ref 6–23)
CALCIUM ION: 1.16 mmol/L (ref 1.13–1.30)
Chloride: 102 mEq/L (ref 96–112)
Creatinine, Ser: 1.1 mg/dL (ref 0.50–1.10)
GLUCOSE: 104 mg/dL — AB (ref 70–99)
HEMATOCRIT: 44 % (ref 36.0–46.0)
HEMOGLOBIN: 15 g/dL (ref 12.0–15.0)
POTASSIUM: 4.1 meq/L (ref 3.7–5.3)
Sodium: 137 mEq/L (ref 137–147)
TCO2: 26 mmol/L (ref 0–100)

## 2014-06-09 LAB — POCT URINALYSIS DIP (DEVICE)
Bilirubin Urine: NEGATIVE
Glucose, UA: NEGATIVE mg/dL
KETONES UR: NEGATIVE mg/dL
LEUKOCYTES UA: NEGATIVE
Nitrite: NEGATIVE
PH: 6 (ref 5.0–8.0)
PROTEIN: NEGATIVE mg/dL
SPECIFIC GRAVITY, URINE: 1.02 (ref 1.005–1.030)
Urobilinogen, UA: 0.2 mg/dL (ref 0.0–1.0)

## 2014-06-09 NOTE — ED Provider Notes (Signed)
CSN: [id number]     Arrival date & time 06/09/14  1434 History   First MD Initiated Contact with Patient 06/09/14 1557     Chief Complaint  Patient presents with  . Extremity Pain   (Consider location/radiation/quality/duration/timing/severity/associated sxs/prior Treatment) HPI          79 year old female presents complaining of muscle cramps. She has had these muscle cramps for many years. They got worse today while she was eating. She felt like her hands Cramping when she was trying to use her 4 and hurt to straighten her fingers back out. She has had the grants for a long time but they rarely ever get this bad. She denies any injury. No numbness or weakness. She has a history of arthritis. Additionally she complains of being cold all the time since the weather changed.  Past Medical History  Diagnosis Date  . Hyperlipidemia   . Thyroid disease   . Arthritis   . Asthma   . Depression   . Colon polyps   . Environmental allergies   . Complete heart block -intermittent     a. 11/2012 s/p SJM Accent DR RF dc ppm.  . Osteoarthritis     hands  . Memory retention disorder 03/22/2013   Past Surgical History  Procedure Laterality Date  . Knee surgery    . Salpingoophorectomy      right  . Appendectomy  1965  . Colonoscopy  2007    adenomatous polyps  . Insert / replace / remove pacemaker  12/02/2012     Dr Lovena Le  . Hand surgery    . Ovarian cystectomy  1965  . Tumor removed  1985    abdomen  . Cervical polypectomy  1996  . Permanent pacemaker insertion N/A 12/02/2012    Procedure: PERMANENT PACEMAKER INSERTION;  Surgeon: Evans Lance, MD;  Location: [facility] CATH LAB;  Service: Cardiovascular;  Laterality: N/A;   Family History  Problem Relation Age of Onset  . Colon cancer Father   . Colon polyps Brother   . Diabetes Brother     maternal aunt  . Coronary artery disease Brother    History  Substance Use Topics  . Smoking status: Never Smoker   . Smokeless tobacco: Never Used   . Alcohol Use: Yes     Comment: occas. which is seldom   OB History    No data available     Review of Systems  Constitutional: Positive for chills.  Musculoskeletal: Positive for myalgias (see HPI).    Allergies  Sulfa antibiotics and Sulfasalazine  Home Medications   Prior to Admission medications   Medication Sig Start Date End Date Taking? Authorizing Provider  Cholecalciferol (VITAMIN D-3 PO) Take 1 capsule by mouth daily.    Historical Provider, MD  donepezil (ARICEPT) 23 MG TABS tablet Take 1 tablet (23 mg total) by mouth at bedtime. 01/30/14   Asencion Partridge Dohmeier, MD  guaiFENesin (MUCINEX) 600 MG 12 hr tablet Take 600 mg by mouth 2 (two) times daily as needed for cough.    Historical Provider, MD  Lactobacillus (ACIDOPHOLUS PO) Take 1 tablet by mouth daily.     Historical Provider, MD  levothyroxine (SYNTHROID, LEVOTHROID) 25 MCG tablet As directed 09/18/13   Historical Provider, MD  methylcellulose (ARTIFICIAL TEARS) 1 % ophthalmic solution Place 1 drop into both eyes as needed (eye irritation).    Historical Provider, MD  Multiple Vitamins-Minerals (MULTIVITAMIN WITH MINERALS) tablet Take 1 tablet by mouth daily.  Historical Provider, MD  tolterodine (DETROL) 2 MG tablet Take one or two times daily q 12 hours for urinary frequency 03/05/14   Elby Showers, MD   BP 122/74 mmHg  Pulse 67  Temp(Src) 98.4 F (36.9 C) (Oral)  Resp 16  SpO2 100% Physical Exam  Constitutional: She is oriented to person, place, and time. Vital signs are normal. She appears well-developed and well-nourished. No distress.  HENT:  Head: Normocephalic and atraumatic.  Cardiovascular:  Pulses:      Radial pulses are 2+ on the right side, and 2+ on the left side.  Pulmonary/Chest: Effort normal. No respiratory distress.  Musculoskeletal:       Right hand: She exhibits deformity (heberdens nodes noted). She exhibits normal range of motion, no tenderness, normal two-point discrimination and no  swelling. Normal sensation noted. Normal strength noted.       Left hand: She exhibits deformity (heberdens nodes noted). She exhibits normal range of motion, no tenderness and normal two-point discrimination. Normal sensation noted. Normal strength noted.  Neurological: She is alert and oriented to person, place, and time. She has normal strength. No sensory deficit. She exhibits normal muscle tone. Coordination normal.  Skin: Skin is warm and dry. No rash noted. She is not diaphoretic.  Psychiatric: She has a normal mood and affect. Judgment normal.  Nursing note and vitals reviewed.   ED Course  Procedures (including critical care time) Labs Review Labs Reviewed  POCT URINALYSIS DIP (DEVICE) - Abnormal; Notable for the following:    Hgb urine dipstick MODERATE (*)    All other components within normal limits  POCT I-STAT, CHEM 8 - Abnormal; Notable for the following:    BUN 36 (*)    Glucose, Bld 104 (*)    All other components within normal limits    Imaging Review No results found.   MDM   1. Muscle cramps    No gross abnormalities on PE, UA, or istat.  Increase PO fluids.  F/U with PCP if that doesn't help her symptoms.         Liam Graham, PA-C 06/09/14 [phone]

## 2014-06-09 NOTE — ED Notes (Signed)
PT REPORTS  INTERMITTANT  CRAMPS IN LEGS  AND  HANDS  WHICH SHE  REPORTS  SHE  HAS  HAD  FOR  YEARS    - WORSE  TODAY  WHILE EATING  USING  UTENSILS       SHE  ALSO REPORTS  SOME FREQUENT  URINATION

## 2014-06-09 NOTE — Discharge Instructions (Signed)

## 2014-06-12 LAB — MDC_IDC_ENUM_SESS_TYPE_REMOTE
Brady Statistic AP VS Percent: 2.8 %
Brady Statistic AS VP Percent: 21 %
Date Time Interrogation Session: 20151210075433
Implantable Pulse Generator Model: 2210
Implantable Pulse Generator Serial Number: 7474456
Lead Channel Impedance Value: 410 Ohm
Lead Channel Impedance Value: 450 Ohm
Lead Channel Sensing Intrinsic Amplitude: 11.9 mV
Lead Channel Sensing Intrinsic Amplitude: 4 mV
Lead Channel Setting Sensing Sensitivity: 2 mV
MDC IDC MSMT BATTERY REMAINING LONGEVITY: 112 mo
MDC IDC MSMT BATTERY REMAINING PERCENTAGE: 95.5 %
MDC IDC MSMT BATTERY VOLTAGE: 2.99 V
MDC IDC SET LEADCHNL RA PACING AMPLITUDE: 2 V
MDC IDC SET LEADCHNL RV PACING AMPLITUDE: 2.5 V
MDC IDC SET LEADCHNL RV PACING PULSEWIDTH: 0.5 ms
MDC IDC STAT BRADY AP VP PERCENT: 19 %
MDC IDC STAT BRADY AS VS PERCENT: 56 %
MDC IDC STAT BRADY RA PERCENT PACED: 22 %
MDC IDC STAT BRADY RV PERCENT PACED: 41 %

## 2014-06-18 ENCOUNTER — Telehealth: Payer: Self-pay | Admitting: Internal Medicine

## 2014-06-18 NOTE — Telephone Encounter (Signed)
Pt has memory issues. May need to speak with her husband if she calls back.

## 2014-06-18 NOTE — Telephone Encounter (Signed)
Patient was seen at Urgent Care on 12/18.   Notes are in EPIC.  She was seen for pain in her hands; couldn't hold utensils while out for dinner.  They did blookwork and several tests.  Patient advised she was told to f/u with her PCP.  I read the note and it says to f/u IF she continues to have any further pain/symptoms.  Patient states that she is not having that pain or cramps at the present time.    Patient then stated she was told her Glucose was high and she should f/u on that.  Her non-fasting glucose was 104.  Advised patient of the normal ranges and to watch her diet, decrease sugar intake, watch carbohydrates, etc.  Otherwise, her glucose shouldn't be an issue at this time.    Advised patient then she doesn't have to f/u per the UC note.  If she has any additional problems to please call us and we'll book appointment at that time.    Patient verbalized understanding of our conversation after going over the instructions with her in detail 3-4 times.  Spent 10-12 minutes on the phone with her going over diet, etc.    FYI

## 2014-06-18 NOTE — Telephone Encounter (Signed)
Patients husband originally called stating the same information.  She got frustrated with listening to her husband and told him SHE wanted to talk.  So, originally I did speak with the husband and he was wanting to make the f/u appointment.  The Urgent Care note only stated to f/u if she was still having cramps or pain in her hands.  She stated she was not.

## 2014-06-26 ENCOUNTER — Encounter: Payer: Self-pay | Admitting: Cardiology

## 2014-06-27 ENCOUNTER — Ambulatory Visit: Payer: Medicare Other | Admitting: Nurse Practitioner

## 2014-07-27 ENCOUNTER — Encounter: Payer: Self-pay | Admitting: Nurse Practitioner

## 2014-07-27 ENCOUNTER — Ambulatory Visit (INDEPENDENT_AMBULATORY_CARE_PROVIDER_SITE_OTHER): Payer: 59 | Admitting: Nurse Practitioner

## 2014-07-27 VITALS — BP 107/59 | HR 69 | Ht 68.5 in | Wt 133.0 lb

## 2014-07-27 DIAGNOSIS — R413 Other amnesia: Secondary | ICD-10-CM

## 2014-07-27 DIAGNOSIS — Z8673 Personal history of transient ischemic attack (TIA), and cerebral infarction without residual deficits: Secondary | ICD-10-CM

## 2014-07-27 MED ORDER — DONEPEZIL HCL 23 MG PO TABS: 23.0000 mg | ORAL_TABLET | Freq: Every morning | ORAL | Status: DC

## 2014-07-27 NOTE — Progress Notes (Signed)
[facility]  PATIENT: [name] DOB: 12-Dec-1934   REASON FOR VISIT: Follow-up for memory loss HISTORY FROM:patient and husband    HISTORY OF PRESENT ILLNESS:[name], 80 year old female returns for followup. She was last seen by Dr. Brett Fairy 01/30/2014. At that time her Mini-Mental Status exam score had changed significantly from 28 to 23.  EEG in the past normal. Dementia labs return normal. Ct of the head without change from last in Jan 2011. She was taking Aricept 23 mg daily until 2 weeks ago when she read an article in the newspaper that there was a better drug on the market. She does not remember what that drug was. She claims she exercises by walking every day. She does minimal cooking she and her husband eat out a lot.  She is able to perform her activities of daily living such as dressing bathing etc. She does not do the finances. She lives with her husband. She does not drive. She denies any recent falls    HISTORY: CD She is a homemaker , but taught school, adopted 2 children and then had a biological daughter.Marland Kitchen Her husband is retired , neither partner smokes or drinks, the adult children live in other states. The patient has a history of depression, hypothyroidism, possible pre-diabetes. Dr. Renold Genta is concerned about possible early dementia. The patient also states that she has a pacemaker has a history of a first degree A V. block. She'll has a history of dizziness and possible TIA and is an established patient with Dr. Leonie Man at [facility].  She had transient left-sided weakness when he saw her in 2011. In March 2011, she underwent an MRI of the brain ( ordering physician was Dr. Brett Fairy) with no evidence of tumor or infarct or bleed. Evident was chronic microvascular ischemia ,but this was considered age related ( Dr Leonie Man interpreted) .  The evaluation for possible stroke or TIA in March 2011 followed a history of a sudden fall within the  confines of her home. She had walked from the living room to the kitchen and fell between the coffee table and the sofa. Apparently, there was a second episode when she fell suddenly to the right. The second episode was associated with numbness of the left side. 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 A cardiac monitoring revealed AV block as the possible cause for her falls. She has seen Dr. Crissie Sickles twice 2011 year for syncope with a sick sinus syndrome ( but states she" hadn't fainted " ). She has a Diplomatic Services operational officer since ? (" i don't know when- its been a while " )  Meanwhile the patient feels that her memory has never been that much better and that in all functions have been taking away from her- she never needed to keep the financial books.  She feels, that she is performrng at the same level as last year or 2 years ago.  Extensive laboratory results were already performed by her primary care physician and reviewed today, she has no problems with sleep, she has no nocturnal hallucinations she reports normal light terrors or REM behavior disorder, she also had no recent falls. There is no parkinsonism, shuffling gait, urinary incontinence.   REVIEW OF SYSTEMS: Full 14 system review of systems performed and notable only for those listed, all others are neg:  Constitutional: neg  Cardiovascular: neg Ear/Nose/Throat: neg  Skin: neg Eyes: Light sensitivity Respiratory: neg Gastroitestinal: Urinary  frequency Hematologic neg  Endocrine intolerance to cold Musculoskeletal:neg Allergy/Immunology: neg Neurological:  memory loss Psychiatric: Confusion Sleep : neg   ALLERGIES: Allergies  Allergen Reactions  . Sulfa Antibiotics Diarrhea and Rash  . Sulfasalazine Diarrhea and Rash    HOME MEDICATIONS: Outpatient Prescriptions Prior to Visit  Medication Sig Dispense Refill  . Cholecalciferol (VITAMIN D-3 PO) Take 1 capsule by mouth  daily.    Marland Kitchen guaiFENesin (MUCINEX) 600 MG 12 hr tablet Take 600 mg by mouth 2 (two) times daily as needed for cough.    . Lactobacillus (ACIDOPHOLUS PO) Take 1 tablet by mouth daily.     Marland Kitchen levothyroxine (SYNTHROID, LEVOTHROID) 25 MCG tablet As directed    . methylcellulose (ARTIFICIAL TEARS) 1 % ophthalmic solution Place 1 drop into both eyes as needed (eye irritation).    . Multiple Vitamins-Minerals (MULTIVITAMIN WITH MINERALS) tablet Take 1 tablet by mouth daily.      Marland Kitchen tolterodine (DETROL) 2 MG tablet Take one or two times daily q 12 hours for urinary frequency 60 tablet 0  . donepezil (ARICEPT) 23 MG TABS tablet Take 1 tablet (23 mg total) by mouth at bedtime. 30 tablet 5   Facility-Administered Medications Prior to Visit  Medication Dose Route Frequency Provider Last Rate Last Dose  . pneumococcal 13-valent conjugate vaccine (PREVNAR 13) injection 0.5 mL  0.5 mL Intramuscular Once Elby Showers, MD        PAST MEDICAL HISTORY: Past Medical History  Diagnosis Date  . Hyperlipidemia   . Thyroid disease   . Arthritis   . Asthma   . Depression   . Colon polyps   . Environmental allergies   . Complete heart block -intermittent     a. 11/2012 s/p SJM Accent DR RF dc ppm.  . Osteoarthritis     hands  . Memory retention disorder 03/22/2013    PAST SURGICAL HISTORY: Past Surgical History  Procedure Laterality Date  . Knee surgery    . Salpingoophorectomy      right  . Appendectomy  1965  . Colonoscopy  2007    adenomatous polyps  . Insert / replace / remove pacemaker  12/02/2012     Dr Lovena Le  . Hand surgery    . Ovarian cystectomy  1965  . Tumor removed  1985    abdomen  . Cervical polypectomy  1996  . Permanent pacemaker insertion N/A 12/02/2012    Procedure: PERMANENT PACEMAKER INSERTION;  Surgeon: Evans Lance, MD;  Location: [facility] CATH LAB;  Service: Cardiovascular;  Laterality: N/A;    FAMILY HISTORY: Family History  Problem Relation Age of Onset  . Colon cancer  Father   . Colon polyps Brother   . Diabetes Brother     maternal aunt  . Coronary artery disease Brother     SOCIAL HISTORY: History   Social History  . Marital Status: Married    Spouse Name: [name]    Number of Children: 3  . Years of Education: College   Occupational History  . elem. school teacher     retired   Social History Main Topics  . Smoking status: Never Smoker   . Smokeless tobacco: Never Used  . Alcohol Use: 0.0 oz/week    0 Not specified per week     Comment: occas. which is seldom  . Drug Use: No  . Sexual Activity: Not on file   Other Topics Concern  . Not on file   Social History Narrative   Patient  has one daughter, two adopted children(son,daughter).   Patient lives at home with spouse [name]).   Patient is retired.   Patient has a college education.   Patient is right-handed.   Patient drinks very little caffeine.     PHYSICAL EXAM  Filed Vitals:   07/27/14 0907  BP: 107/59  Pulse: 69  Height: 5' 8.5" (1.74 m)  Weight: 133 lb (60.328 kg)   Body mass index is 19.93 kg/(m^2).  Generalized: Well developed, in no acute distress , well groomed Head: normocephalic and atraumatic,. Oropharynx benign  Neck: Supple Cardiac: Regular rate rhythm Musculoskeletal: No deformity   Neurological examination   Mentation: Alert oriented to time, place, history taking. MMSE 28/30 missing items in orientation, not recall.  AFT  13. Attention span and concentration appropriate. Recent and remote memory intact.  Follows all commands speech and language fluent.   Cranial nerve II-XII: Fundoscopic exam deferred .Pupils were equal round reactive to light extraocular movements were full, visual field were full on confrontational test. Facial sensation and strength were normal. hearing was intact to finger rubbing bilaterally. Uvula tongue midline. head turning and shoulder shrug were normal and symmetric.Tongue protrusion into cheek strength was  normal. Motor: normal bulk and tone, full strength in the BUE, BLE, fine finger movements normal, no pronator drift. No focal weakness Sensory: normal and symmetric to light touch, pinprick, and  Vibration, proprioception  Coordination: finger-nose-finger, heel-to-shin bilaterally, no dysmetria Reflexes: Brachioradialis 2/2, biceps 2/2, triceps 2/2, patellar 2/2, Achilles 2/2, plantar responses were flexor bilaterally. Gait and Station: Rising up from seated position without assistance, normal stance,  moderate stride, good arm swing, smooth turning, able to perform tiptoe, and heel walking without difficulty. Tandem gait is steady. no assistive device   DIAGNOSTIC DATA (LABS, IMAGING, TESTING) - I reviewed patient records, labs, notes, testing and imaging myself where available.  Lab Results  Component Value Date   WBC 5.6 03/05/2014   HGB 15.0 06/09/2014   HCT 44.0 06/09/2014   MCV 91.4 03/05/2014   PLT 198 03/05/2014      Component Value Date/Time   NA 137 06/09/2014 1621   K 4.1 06/09/2014 1621   CL 102 06/09/2014 1621   CO2 29 03/05/2014 1056   GLUCOSE 104* 06/09/2014 1621   BUN 36* 06/09/2014 1621   CREATININE 1.10 06/09/2014 1621   CREATININE 0.86 03/05/2014 1056   CALCIUM 9.2 03/05/2014 1056   PROT 6.5 03/05/2014 1056   ALBUMIN 4.3 03/05/2014 1056   AST 31 03/05/2014 1056   ALT 29 03/05/2014 1056   ALKPHOS 71 03/05/2014 1056   BILITOT 0.5 03/05/2014 1056   GFRNONAA 69* 11/26/2012 0920   GFRAA 80* 11/26/2012 0920   Lab Results  Component Value Date   CHOL 191 10/17/2013   HDL 74.50 10/17/2013   LDLCALC 106* 10/17/2013   LDLDIRECT 127.6 04/21/2013   TRIG 53.0 10/17/2013   CHOLHDL 3 10/17/2013   Lab Results  Component Value Date   HGBA1C 5.9* 03/05/2014   Lab Results  Component Value Date   VITAMINB12 460 03/05/2014   Lab Results  Component Value Date   TSH 2.861 03/05/2014      ASSESSMENT AND PLAN  80 y.o. year old female  has a past medical  history of Hyperlipidemia; Thyroid disease; Arthritis; Asthma; Depression; Colon polyps; Environmental allergies; Complete heart block -intermittent; Osteoarthritis; and Memory retention disorder (03/22/2013). here to follow-up. EEG in the past has been normal. Dementia labs normal. CT of the head unchanged.  Resume  Aricept 23 mg daily will refill do not stop this medication without instructions Exercise for over all health and well being 50% of visit time spent discussing  with the patient and the husband the use of Aricept and importance of not stopping the drug suddenly. The medication is for memory loss. Given ideas to stimulate the memory ie. Word games, card games, crosswords, etc.  F/U  6 monthsVst time 25 min Dennie Bible, [facility], [facility] APRN  [facility] [address] [place], [place] [address] [phone]

## 2014-07-27 NOTE — Progress Notes (Signed)
I agree with the assessment and plan as directed by NP .The patient is  followed  for dementia, progressive over the last 5 years., and  known to me . This patient may be a possible participant in Dr Clydene Fake upcoming dementia study/ trial. Please involve Christian in research.  A CC was send to dr Leonie Man.      Melissa Siever, MD

## 2014-07-27 NOTE — Patient Instructions (Signed)
Resume Aricept 23 mg daily will refill do not stop this medication without instructions Exercise for over all health and well being F/U in 6 months

## 2014-07-31 NOTE — Progress Notes (Signed)
I agree with the above plan 

## 2014-08-08 ENCOUNTER — Encounter (HOSPITAL_COMMUNITY): Payer: Self-pay | Admitting: Emergency Medicine

## 2014-08-08 ENCOUNTER — Emergency Department (HOSPITAL_COMMUNITY)
Admission: EM | Admit: 2014-08-08 | Discharge: 2014-08-08 | Disposition: A | Discharge status: Home / Self Care | Payer: Medicare Other | Attending: Emergency Medicine | Admitting: Emergency Medicine

## 2014-08-08 ENCOUNTER — Emergency Department (HOSPITAL_COMMUNITY): Payer: Medicare Other

## 2014-08-08 ENCOUNTER — Encounter (HOSPITAL_COMMUNITY): Payer: Self-pay

## 2014-08-08 ENCOUNTER — Emergency Department (INDEPENDENT_AMBULATORY_CARE_PROVIDER_SITE_OTHER)
Admission: EM | Admit: 2014-08-08 | Discharge: 2014-08-08 | Disposition: A | Discharge status: Other Acute Inpatient Hospital | Payer: Medicare Other | Source: Home / Self Care | Attending: Family Medicine | Admitting: Family Medicine

## 2014-08-08 DIAGNOSIS — Z8679 Personal history of other diseases of the circulatory system: Secondary | ICD-10-CM | POA: Diagnosis not present

## 2014-08-08 DIAGNOSIS — R531 Weakness: Secondary | ICD-10-CM | POA: Diagnosis not present

## 2014-08-08 DIAGNOSIS — Z9049 Acquired absence of other specified parts of digestive tract: Secondary | ICD-10-CM | POA: Insufficient documentation

## 2014-08-08 DIAGNOSIS — J45909 Unspecified asthma, uncomplicated: Secondary | ICD-10-CM | POA: Insufficient documentation

## 2014-08-08 DIAGNOSIS — K5900 Constipation, unspecified: Secondary | ICD-10-CM | POA: Diagnosis not present

## 2014-08-08 DIAGNOSIS — Z86010 Personal history of colonic polyps: Secondary | ICD-10-CM | POA: Insufficient documentation

## 2014-08-08 DIAGNOSIS — Z8739 Personal history of other diseases of the musculoskeletal system and connective tissue: Secondary | ICD-10-CM | POA: Diagnosis not present

## 2014-08-08 DIAGNOSIS — R109 Unspecified abdominal pain: Secondary | ICD-10-CM | POA: Diagnosis not present

## 2014-08-08 DIAGNOSIS — F0390 Unspecified dementia without behavioral disturbance: Secondary | ICD-10-CM | POA: Diagnosis not present

## 2014-08-08 DIAGNOSIS — R1032 Left lower quadrant pain: Secondary | ICD-10-CM

## 2014-08-08 DIAGNOSIS — Z950 Presence of cardiac pacemaker: Secondary | ICD-10-CM | POA: Diagnosis not present

## 2014-08-08 DIAGNOSIS — E079 Disorder of thyroid, unspecified: Secondary | ICD-10-CM | POA: Diagnosis not present

## 2014-08-08 DIAGNOSIS — Z79899 Other long term (current) drug therapy: Secondary | ICD-10-CM | POA: Diagnosis not present

## 2014-08-08 DIAGNOSIS — R1031 Right lower quadrant pain: Secondary | ICD-10-CM

## 2014-08-08 LAB — CBC WITH DIFFERENTIAL/PLATELET
BASOS PCT: 0 % (ref 0–1)
Basophils Absolute: 0 10*3/uL (ref 0.0–0.1)
EOS ABS: 0 10*3/uL (ref 0.0–0.7)
Eosinophils Relative: 0 % (ref 0–5)
HCT: 39.6 % (ref 36.0–46.0)
Hemoglobin: 13.7 g/dL (ref 12.0–15.0)
LYMPHS PCT: 17 % (ref 12–46)
Lymphs Abs: 1.1 10*3/uL (ref 0.7–4.0)
MCH: 31.4 pg (ref 26.0–34.0)
MCHC: 34.6 g/dL (ref 30.0–36.0)
MCV: 90.8 fL (ref 78.0–100.0)
MONOS PCT: 8 % (ref 3–12)
Monocytes Absolute: 0.5 10*3/uL (ref 0.1–1.0)
NEUTROS ABS: 4.7 10*3/uL (ref 1.7–7.7)
NEUTROS PCT: 75 % (ref 43–77)
Platelets: 169 10*3/uL (ref 150–400)
RBC: 4.36 MIL/uL (ref 3.87–5.11)
RDW: 12.5 % (ref 11.5–15.5)
WBC: 6.4 10*3/uL (ref 4.0–10.5)

## 2014-08-08 LAB — TROPONIN I
Troponin I: <0.03 ng/mL (ref <0.031)
Troponin I: <0.03 ng/mL (ref <0.031)

## 2014-08-08 LAB — URINALYSIS, ROUTINE W REFLEX MICROSCOPIC
Bilirubin Urine: NEGATIVE
GLUCOSE, UA: NEGATIVE mg/dL
Ketones, ur: 15 mg/dL — AB
LEUKOCYTES UA: NEGATIVE
Nitrite: NEGATIVE
PH: 6.5 (ref 5.0–8.0)
Protein, ur: NEGATIVE mg/dL
Specific Gravity, Urine: 1.009 (ref 1.005–1.030)
Urobilinogen, UA: 0.2 mg/dL (ref 0.0–1.0)

## 2014-08-08 LAB — COMPREHENSIVE METABOLIC PANEL
ALBUMIN: 3.8 g/dL (ref 3.5–5.2)
ALT: 20 U/L (ref 0–35)
ANION GAP: 5 (ref 5–15)
AST: 31 U/L (ref 0–37)
Alkaline Phosphatase: 64 U/L (ref 39–117)
BILIRUBIN TOTAL: 0.8 mg/dL (ref 0.3–1.2)
BUN: 16 mg/dL (ref 6–23)
CALCIUM: 9.4 mg/dL (ref 8.4–10.5)
CO2: 26 mmol/L (ref 19–32)
CREATININE: 0.92 mg/dL (ref 0.50–1.10)
Chloride: 106 mmol/L (ref 96–112)
GFR calc Af Amer: 68 mL/min — ABNORMAL LOW (ref >90)
GFR calc non Af Amer: 59 mL/min — ABNORMAL LOW (ref >90)
Glucose, Bld: 100 mg/dL — ABNORMAL HIGH (ref 70–99)
Potassium: 3.7 mmol/L (ref 3.5–5.1)
Sodium: 137 mmol/L (ref 135–145)
Total Protein: 6.2 g/dL (ref 6.0–8.3)

## 2014-08-08 LAB — I-STAT CG4 LACTIC ACID, ED
LACTIC ACID, VENOUS: 0.97 mmol/L (ref 0.5–2.0)
Lactic Acid, Venous: 1.26 mmol/L (ref 0.5–2.0)

## 2014-08-08 LAB — URINE MICROSCOPIC-ADD ON

## 2014-08-08 LAB — POC OCCULT BLOOD, ED: FECAL OCCULT BLD: NEGATIVE

## 2014-08-08 LAB — LIPASE, BLOOD: Lipase: 35 U/L (ref 11–59)

## 2014-08-08 MED ORDER — IOHEXOL 300 MG/ML  SOLN
25.0000 mL | Freq: Once | INTRAMUSCULAR | Status: AC | PRN
  Administered 2014-08-08: 25 mL via ORAL

## 2014-08-08 MED ORDER — SODIUM CHLORIDE 0.9 % IV BOLUS (SEPSIS)
1000.0000 mL | Freq: Once | INTRAVENOUS | Status: DC
  Administered 2014-08-08: 1000 mL via INTRAVENOUS

## 2014-08-08 MED ORDER — IOHEXOL 300 MG/ML  SOLN
100.0000 mL | Freq: Once | INTRAMUSCULAR | Status: AC | PRN
  Administered 2014-08-08: 100 mL via INTRAVENOUS

## 2014-08-08 NOTE — ED Notes (Signed)
CT notified that the pt is ready for her scan. Family is requesting to speak with Dr. Wyvonnia Dusky.

## 2014-08-08 NOTE — ED Notes (Signed)
Pt denies abd pain at this time

## 2014-08-08 NOTE — ED Notes (Signed)
CT notified that the pt has another IV.

## 2014-08-08 NOTE — ED Notes (Signed)
POC Hemicult- Negative

## 2014-08-08 NOTE — ED Provider Notes (Signed)
CSN: [id number]     Arrival date & time 08/08/14  1645 History   First MD Initiated Contact with Patient 08/08/14 1714     Chief Complaint  Patient presents with  . Abdominal Pain   (Consider location/radiation/quality/duration/timing/severity/associated sxs/prior Treatment) Patient is a 80 y.o. female presenting with abdominal pain. The history is provided by the patient.  Abdominal Pain Pain location:  RLQ and LLQ Pain severity:  Moderate Onset quality:  Sudden Duration:  7 hours Progression:  Unchanged Chronicity:  New Associated symptoms: chills   Associated symptoms: no diarrhea, no fever, no nausea and no vomiting     Past Medical History  Diagnosis Date  . Hyperlipidemia   . Thyroid disease   . Arthritis   . Asthma   . Depression   . Colon polyps   . Environmental allergies   . Complete heart block -intermittent     a. 11/2012 s/p SJM Accent DR RF dc ppm.  . Osteoarthritis     hands  . Memory retention disorder 03/22/2013   Past Surgical History  Procedure Laterality Date  . Knee surgery    . Salpingoophorectomy      right  . Appendectomy  1965  . Colonoscopy  2007    adenomatous polyps  . Insert / replace / remove pacemaker  12/02/2012     Dr Lovena Le  . Hand surgery    . Ovarian cystectomy  1965  . Tumor removed  1985    abdomen  . Cervical polypectomy  1996  . Permanent pacemaker insertion N/A 12/02/2012    Procedure: PERMANENT PACEMAKER INSERTION;  Surgeon: Evans Lance, MD;  Location: [facility] C[facility] LAB;  Service: Cardiovascular;  Laterality: N/A;   Family History  Problem Relation Age of Onset  . Colon cancer Father   . Colon polyps Brother   . Diabetes Brother     maternal aunt  . Coronary artery disease Brother    History  Substance Use Topics  . Smoking status: Never Smoker   . Smokeless tobacco: Never Used  . Alcohol Use: 0.0 oz/week    0 Standard drinks or equivalent per week     Comment: occas. which is seldom   OB History    No data  available     Review of Systems  Constitutional: Positive for chills. Negative for fever.  Gastrointestinal: Positive for abdominal pain. Negative for nausea, vomiting and diarrhea.    Allergies  Sulfa antibiotics and Sulfasalazine  Home Medications   Prior to Admission medications   Medication Sig Start Date End Date Taking? Authorizing Provider  Cholecalciferol (VITAMIN D-3 PO) Take 1 capsule by mouth daily.    Historical Provider, MD  donepezil (ARICEPT) 23 MG TABS tablet Take 1 tablet (23 mg total) by mouth every morning. With food 07/27/14   Dennie Bible, NP  guaiFENesin (MUCINEX) 600 MG 12 hr tablet Take 600 mg by mouth 2 (two) times daily as needed for cough.    Historical Provider, MD  Lactobacillus (ACIDOPHOLUS PO) Take 1 tablet by mouth daily.     Historical Provider, MD  levothyroxine (SYNTHROID, LEVOTHROID) 25 MCG tablet  06/19/14   Historical Provider, MD  levothyroxine (SYNTHROID, LEVOTHROID) 50 MCG tablet 50 mcg daily. 07/17/14   Historical Provider, MD  methylcellulose (ARTIFICIAL TEARS) 1 % ophthalmic solution Place 1 drop into both eyes as needed (eye irritation).    Historical Provider, MD  Multiple Vitamins-Minerals (MULTIVITAMIN WITH MINERALS) tablet Take 1 tablet by mouth daily.  Historical Provider, MD  tolterodine (DETROL) 2 MG tablet Take one or two times daily q 12 hours for urinary frequency 03/05/14   Elby Showers, MD   BP 131/110 mmHg  Pulse 68  Temp(Src) 97.5 F (36.4 C) (Oral)  SpO2 97% Physical Exam  Constitutional: She appears well-developed and well-nourished. She appears distressed.  HENT:  Mouth/Throat: Oropharynx is clear and moist.  Neck: Normal range of motion. Neck supple.  Cardiovascular: Normal heart sounds.   Pulmonary/Chest: Breath sounds normal.  Abdominal: She exhibits no distension and no mass. There is tenderness. There is no rebound and no guarding.  Lymphadenopathy:    She has no cervical adenopathy.  Neurological: She  is alert.  Skin: Skin is warm and dry.  Nursing note and vitals reviewed.   ED Course  Procedures (including critical care time) Labs Review Labs Reviewed - No data to display  Imaging Review No results found.   MDM   1. Abdominal pain, acute, bilateral lower quadrant    Sent for eval of chills and sudden abd pain since 10:30 am today, no n/v/d. No back pain.    Billy Fischer, MD 08/08/14 [phone]

## 2014-08-08 NOTE — ED Notes (Signed)
Concerned about abdominal pain onset this AM around 10 AM. States she feels better when she lays down, looks uncaomfortable

## 2014-08-08 NOTE — ED Provider Notes (Signed)
CSN: [id number]     Arrival date & time 08/08/14  1816 History   First MD Initiated Contact with Patient 08/08/14 1824     Chief Complaint  Patient presents with  . Abdominal Pain     (Consider location/radiation/quality/duration/timing/severity/associated sxs/prior Treatment) HPI Comments: Patient from urgent care with a one-day history of bony feeling in her abdomen. She denies pain. She states her abdomen felt "bubbly" he had to lie down today. She endorses history of constipation with last bowel movement this morning. She has dementia and her history is somewhat unreliable. Her husband states she was complaining of abdominal pain after eating some grapes earlier today. She denies any vomiting. She denies any diarrhea or fever. She denies any chest pain or shortness of breath. No urinary or vaginal symptoms. Patient has history of pacemaker, arthritis, asthma, dementia. Patient states she felt generally weak and often thought by lying down her symptoms would improve.  The history is provided by the patient, the EMS personnel and a relative. The history is limited by the condition of the patient.    Past Medical History  Diagnosis Date  . Hyperlipidemia   . Thyroid disease   . Arthritis   . Asthma   . Depression   . Colon polyps   . Environmental allergies   . Complete heart block -intermittent     a. 11/2012 s/p SJM Accent DR RF dc ppm.  . Osteoarthritis     hands  . Memory retention disorder 03/22/2013   Past Surgical History  Procedure Laterality Date  . Knee surgery    . Salpingoophorectomy      right  . Appendectomy  1965  . Colonoscopy  2007    adenomatous polyps  . Insert / replace / remove pacemaker  12/02/2012     Dr Lovena Le  . Hand surgery    . Ovarian cystectomy  1965  . Tumor removed  1985    abdomen  . Cervical polypectomy  1996  . Permanent pacemaker insertion N/A 12/02/2012    Procedure: PERMANENT PACEMAKER INSERTION;  Surgeon: Evans Lance, MD;  Location:  [facility] CATH LAB;  Service: Cardiovascular;  Laterality: N/A;   Family History  Problem Relation Age of Onset  . Colon cancer Father   . Colon polyps Brother   . Diabetes Brother     maternal aunt  . Coronary artery disease Brother    History  Substance Use Topics  . Smoking status: Never Smoker   . Smokeless tobacco: Never Used  . Alcohol Use: 0.0 oz/week    0 Standard drinks or equivalent per week     Comment: occas. which is seldom   OB History    No data available     Review of Systems  Unable to perform ROS: Dementia  Gastrointestinal: Positive for abdominal pain.      Allergies  Sulfa antibiotics and Sulfasalazine  Home Medications   Prior to Admission medications   Medication Sig Start Date End Date Taking? Authorizing Provider  guaiFENesin (MUCINEX) 600 MG 12 hr tablet Take 600 mg by mouth 2 (two) times daily as needed for cough.   Yes Historical Provider, MD  Lactobacillus (ACIDOPHOLUS PO) Take 1 tablet by mouth daily.    Yes Historical Provider, MD  levothyroxine (SYNTHROID, LEVOTHROID) 50 MCG tablet 50 mcg daily. 07/17/14  Yes Historical Provider, MD  methylcellulose (ARTIFICIAL TEARS) 1 % ophthalmic solution Place 1 drop into both eyes as needed (eye irritation).   Yes Historical Provider, MD  Multiple Vitamins-Minerals (MULTIVITAMIN WITH MINERALS) tablet Take 1 tablet by mouth daily.     Yes Historical Provider, MD   BP 120/67 mmHg  Pulse 63  Temp(Src) 98.4 F (36.9 C) (Oral)  Resp 16  Ht 5\' 8"  (1.727 m)  Wt 133 lb (60.328 kg)  BMI 20.23 kg/m2  SpO2 97% Physical Exam  Constitutional: She is oriented to person, place, and time. She appears well-developed and well-nourished. No distress.  HENT:  Head: Normocephalic and atraumatic.  Mouth/Throat: Oropharynx is clear and moist. No oropharyngeal exudate.  Eyes: Conjunctivae and EOM are normal. Pupils are equal, round, and reactive to light.  Neck: Normal range of motion. Neck supple.  No meningismus.   Cardiovascular: Normal rate, regular rhythm, normal heart sounds and intact distal pulses.   No murmur heard. Pulmonary/Chest: Effort normal and breath sounds normal. No respiratory distress.  Abdominal: Soft. There is tenderness. There is no rebound and no guarding.  Diffuse abdominal tenderness. Voluntary guarding  Genitourinary:  Chaperone present, no fecal impaction  Musculoskeletal: Normal range of motion. She exhibits no edema or tenderness.  Neurological: She is alert and oriented to person, place, and time. No cranial nerve deficit. She exhibits normal muscle tone. Coordination normal.  No ataxia on finger to nose bilaterally. No pronator drift. 5/5 strength throughout. CN 2-12 intact. Negative Romberg. Equal grip strength. Sensation intact. Gait is normal.   Skin: Skin is warm.  Psychiatric: She has a normal mood and affect. Her behavior is normal.  Nursing note and vitals reviewed.   ED Course  Procedures (including critical care time) Labs Review Labs Reviewed  COMPREHENSIVE METABOLIC PANEL - Abnormal; Notable for the following:    Glucose, Bld 100 (*)    GFR calc non Af Amer 59 (*)    GFR calc Af Amer 68 (*)    All other components within normal limits  URINALYSIS, ROUTINE W REFLEX MICROSCOPIC - Abnormal; Notable for the following:    Hgb urine dipstick SMALL (*)    Ketones, ur 15 (*)    All other components within normal limits  CBC WITH DIFFERENTIAL/PLATELET  LIPASE, BLOOD  URINE MICROSCOPIC-ADD ON  TROPONIN I  TROPONIN I  POC OCCULT BLOOD, ED  I-STAT CG4 LACTIC ACID, ED  I-STAT CG4 LACTIC ACID, ED    Imaging Review Ct Abdomen Pelvis W Contrast  08/08/2014   CLINICAL DATA:  Lower abdominal pain and nausea.  EXAM: CT ABDOMEN AND PELVIS WITH CONTRAST  TECHNIQUE: Multidetector CT imaging of the abdomen and pelvis was performed using the standard protocol following bolus administration of intravenous contrast.  CONTRAST:  100 mL OMNIPAQUE IOHEXOL 300 MG/ML  SOLN   COMPARISON:  CT abdomen and pelvis 09/04/2011.  FINDINGS: Mild dependent atelectasis is seen the lung bases. No pleural or pericardial effusion. Pacing leads are noted. Heart size is upper normal.  The gallbladder, liver, spleen, adrenal glands and kidneys are unremarkable. Uterus, adnexa and urinary bladder appear normal.  The stomach and small and large bowel appear normal. The appendix has been removed. Aortoiliac atherosclerosis without aneurysm is noted. No lymphadenopathy or fluid is seen.  There is lumbar spondylosis. No worrisome bony lesion is identified.  IMPRESSION: No acute abnormality or finding to explain the patient's symptoms.  Atherosclerosis.  Lumbar spondylosis.   Electronically Signed   By: Inge Rise M.D.   On: 08/08/2014 22:13     EKG Interpretation   Date/Time:  Wednesday August 08 2014 19:45:38 EST Ventricular Rate:  66 PR Interval:  224  QRS Duration: 144 QT Interval:  465 QTC Calculation: 487 R Axis:   -81 Text Interpretation:  Sinus rhythm Prolonged PR interval Nonspecific IVCD  with LAD Left ventricular hypertrophy paced No significant change was  found Confirmed by Wyvonnia Dusky  MD, Zakira Ressel [id number]) on 08/08/2014 10:52:36 PM      MDM   Final diagnoses:  Abdominal pain, unspecified abdominal location   Patient from urgent care with a vague history of abdominal discomfort. Denies pain but guards on exam. History unreliable due to dementia. No chest pain, or SOB.  Denies abdominal pain currently.  EKG is paced. Urinalysis is negative. Labs reassuring. CT scan shows no explanation for patient's abdominal pain.  Patient and husband are anxious to go home. Troponin negative x2.  Tolerating PO.  Return precautions discussed.   Ezequiel Essex, MD 08/09/14 [phone]

## 2014-08-08 NOTE — ED Notes (Signed)
Patient not interested in measuring repeat vitals, requesting to leave now. Reviewed discharge instructions and follow up care.

## 2014-08-08 NOTE — ED Notes (Signed)
Abd pain started today. Pt unsure of when LBM was. Pt has hx of dementia. Pt received 539mL NS bolus from urgent care.

## 2014-08-08 NOTE — Discharge Instructions (Signed)
Abdominal Pain Your testing shows no serious cause of your abdominal pain. Follow up with your doctor. Return to the ED if you develop new or worsening symptoms. Many things can cause abdominal pain. Usually, abdominal pain is not caused by a disease and will improve without treatment. It can often be observed and treated at home. Your health care provider will do a physical exam and possibly order blood tests and X-rays to help determine the seriousness of your pain. However, in many cases, more time must pass before a clear cause of the pain can be found. Before that point, your health care provider may not know if you need more testing or further treatment. HOME CARE INSTRUCTIONS  Monitor your abdominal pain for any changes. The following actions may help to alleviate any discomfort you are experiencing:  Only take over-the-counter or prescription medicines as directed by your health care provider.  Do not take laxatives unless directed to do so by your health care provider.  Try a clear liquid diet (broth, tea, or water) as directed by your health care provider. Slowly move to a bland diet as tolerated. SEEK MEDICAL CARE IF:  You have unexplained abdominal pain.  You have abdominal pain associated with nausea or diarrhea.  You have pain when you urinate or have a bowel movement.  You experience abdominal pain that wakes you in the night.  You have abdominal pain that is worsened or improved by eating food.  You have abdominal pain that is worsened with eating fatty foods.  You have a fever. SEEK IMMEDIATE MEDICAL CARE IF:   Your pain does not go away within 2 hours.  You keep throwing up (vomiting).  Your pain is felt only in portions of the abdomen, such as the right side or the left lower portion of the abdomen.  You pass bloody or black tarry stools. MAKE SURE YOU:  Understand these instructions.   Will watch your condition.   Will get help right away if you are not  doing well or get worse.  Document Released: 03/18/2005 Document Revised: 06/13/2013 Document Reviewed: 02/15/2013 [facility] Patient Information 2015 [place], [place]. This information is not intended to replace advice given to you by your health care provider. Make sure you discuss any questions you have with your health care provider.

## 2014-08-08 NOTE — ED Notes (Signed)
Called CN, Santiago Glad, to advise of pending transfer to main ED for further evaluation. Called [facility] to request transport

## 2014-08-09 ENCOUNTER — Telehealth: Payer: Self-pay | Admitting: Internal Medicine

## 2014-08-09 NOTE — Telephone Encounter (Signed)
New Msg        Pt husband calling, states pt was in the ER for 5 hours yesterday.  States they were instructed to get pacer maker checked and wants to discuss concerns with nurse.  Please return pt call.

## 2014-08-09 NOTE — Telephone Encounter (Signed)
LMOVM for pt to return call 

## 2014-08-09 NOTE — Telephone Encounter (Signed)
Spoke with husband(wife in background) and they insist that the ER MD wants her to see Dr Mare Ferrari.  She was seen in the ER yesterday for abdominal pain.  They are very hard to get information from as they are both talking at the same time. I got the patient on the phone and she is clear she is to see Dr Mare Ferrari.  I let her know  Would talk to [facility] and call the patient back

## 2014-08-09 NOTE — Telephone Encounter (Signed)
Spoke with patient and husband regarding ED visit.  When I asked the patient about her being in the ED yesterday she stated that's what they tell me, she has history of memory issues.  Patient denied having abdominal pains just stated she was tired.  Per  Dr. Mare Ferrari patient needs to follow up with her PCP Patient stated she wanted to see  Dr. Mare Ferrari but explained that she needed to see her PCP Patient was concerned that something could be wrong with her device and wanted to come to office for device check and ov with  Dr. Mare Ferrari  Explained to patient that device clinic could call patient and do remote check. She did say someone would need to call because she did not know how to do remote check.  Will forward to device clinic to follow up with remote check

## 2014-08-10 ENCOUNTER — Ambulatory Visit (INDEPENDENT_AMBULATORY_CARE_PROVIDER_SITE_OTHER): Payer: Medicare Other | Admitting: *Deleted

## 2014-08-10 DIAGNOSIS — I495 Sick sinus syndrome: Secondary | ICD-10-CM

## 2014-08-10 LAB — MDC_IDC_ENUM_SESS_TYPE_INCLINIC
Battery Remaining Longevity: 127.2 mo
Battery Voltage: 3.01 V
Brady Statistic RA Percent Paced: 21 %
Brady Statistic RV Percent Paced: 43 %
Date Time Interrogation Session: 20160219143615
Lead Channel Pacing Threshold Amplitude: 0.5 V
Lead Channel Pacing Threshold Pulse Width: 0.5 ms
Lead Channel Sensing Intrinsic Amplitude: 12 mV
Lead Channel Sensing Intrinsic Amplitude: 3.6 mV
Lead Channel Setting Pacing Amplitude: 2 V
Lead Channel Setting Pacing Amplitude: 2.5 V
Lead Channel Setting Pacing Pulse Width: 0.5 ms
Lead Channel Setting Sensing Sensitivity: 2 mV
MDC IDC MSMT LEADCHNL RV PACING THRESHOLD AMPLITUDE: 1.25 V
MDC IDC MSMT LEADCHNL RV PACING THRESHOLD PULSEWIDTH: 0.5 ms
MDC IDC PG SERIAL: 7474456

## 2014-08-10 NOTE — Telephone Encounter (Signed)
Follow up     Want help in doing a remote check today

## 2014-08-10 NOTE — Progress Notes (Signed)
Pacemaker check in clinic. Normal device function. Thresholds, sensing, impedances consistent with previous measurements. Device programmed to maximize longevity. No mode switch or high ventricular rates noted. Device programmed at appropriate safety margins. Histogram distribution appropriate for patient activity level. Device programmed to optimize intrinsic conduction. Estimated longevity 9.2 to 10.6 years. Patient enrolled in remote follow-up. Follow up as planned.

## 2014-08-10 NOTE — Telephone Encounter (Signed)
Informed pt that transmission was received. Will give transmission to device tech to be looked at and someone will call her back w/ results.

## 2014-08-10 NOTE — Telephone Encounter (Signed)
Device tech called and offered pt 2:00 appt. Pt agreed to appt.

## 2014-08-11 ENCOUNTER — Ambulatory Visit (INDEPENDENT_AMBULATORY_CARE_PROVIDER_SITE_OTHER): Payer: Medicare Other

## 2014-08-11 ENCOUNTER — Ambulatory Visit (INDEPENDENT_AMBULATORY_CARE_PROVIDER_SITE_OTHER): Payer: Medicare Other | Admitting: Family Medicine

## 2014-08-11 VITALS — BP 112/70 | HR 70 | Temp 97.9°F | Resp 16 | Ht 68.0 in | Wt 129.6 lb

## 2014-08-11 DIAGNOSIS — R0602 Shortness of breath: Secondary | ICD-10-CM

## 2014-08-11 DIAGNOSIS — R5381 Other malaise: Secondary | ICD-10-CM

## 2014-08-11 DIAGNOSIS — F0390 Unspecified dementia without behavioral disturbance: Secondary | ICD-10-CM

## 2014-08-11 DIAGNOSIS — R5383 Other fatigue: Secondary | ICD-10-CM

## 2014-08-11 NOTE — Patient Instructions (Signed)
Your chest xray appears ok here tonight, lungs are clear and oxygen normal. I would restart the Aricept and follow up with neurologist to discuss this medicine further. Return to the clinic or go to the nearest emergency room if any of your symptoms worsen or new symptoms occur.

## 2014-08-11 NOTE — Progress Notes (Addendum)
Subjective:  This chart was scribe for Melissa Ray, MD by [name], ED Scribe. The patient was seen in Room 13 and the patient's care was started at 4:07 PM.    Patient ID: [name], female    DOB: 1935/02/15, 80 y.o.   MRN: [id number]  HPI HPI Comments: [name] is a 80 y.o. female who presents to the [facility] complaining of trouble breathing and SOB onset 1 week and a half. Since she has a memory problem, she has problems putting into words exactly what is wrong with her. She has stopped taking her Aricept 2 weeks ago because she read about a better one but has not talked to her PCP about that new medication. She also had an increase in her thyroid medication about 2 weeks ago. She reports that she has been leaning over to the floor more lately because she has 2 new puppies and she has had stress due to work around the house and medical problems with her daughter. She denies being a smoker.   She reports that she gained up to 200 pounds of weight due to her depression medication but has since lost a lot of weight.   Her PCP is Dr. Frances Osborn with fatigue and difficulty getting a breath in the past 1 and half week. She has a hx of a complete heart block. She is status post pace maker with the pace maker checked in clinic yesterday and normal device function. Prior notes reviewed. The pace maker was evaluated. She also has dementia and hypothyroidism. She was seen in the ER 3 days ago. Notes from ER reviewed. Apparently was seen for abdominal pain at that time but hx limited by her hx of dementia. Also noted general weakness. Glucose 100, GFR 59, small blood on UA, EKG was a paced rhythm, overall UA was negative and labs reassuring in ER. She had a negative Troponin x2. She had a CT abdomen, pelvis without any acute abnormality. She had a fecal occult blood test that was negative. Normal CBC.    Patient Active Problem List   Diagnosis Date Noted  .  Weight loss, unintentional 02/19/2014  . Dementia arising in the senium and presenium 01/30/2014  . 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 Memory loss 07/07/2013  . Memory retention disorder 03/22/2013  . Pacemaker 03/09/2013  . Complete heart block -intermittent   . Syncope 11/18/2012  . Personal history of adenomatous colonic polyps 11/12/2011  . History of TIA (transient ischemic attack) 09/19/2011  . Hypothyroidism 01/13/2011  . Hyperlipidemia 01/13/2011  . First degree AV block 01/13/2011  . Gait disturbance 01/13/2011   Past Medical History  Diagnosis Date  . Hyperlipidemia   . Thyroid disease   . Arthritis   . Asthma   . Depression   . Colon polyps   . Environmental allergies   . Complete heart block -intermittent     a. 11/2012 s/p SJM Accent DR RF dc ppm.  . Osteoarthritis     hands  . Memory retention disorder 03/22/2013  . Allergy    Past Surgical History  Procedure Laterality Date  . Knee surgery    . Salpingoophorectomy      right  . Appendectomy  1965  . Colonoscopy  2007    adenomatous polyps  . Insert / replace / remove pacemaker  12/02/2012     Dr [name]  . Hand surgery    .  Ovarian cystectomy  1965  . Tumor removed  1985    abdomen  . Cervical polypectomy  1996  . Permanent pacemaker insertion N/A 12/02/2012    Procedure: PERMANENT PACEMAKER INSERTION;  Surgeon: Melissa Lance, MD;  Location: [facility] CATH LAB;  Service: Cardiovascular;  Laterality: N/A;  . Eye surgery     Allergies  Allergen Reactions  . Sulfa Antibiotics Diarrhea and Rash  . Sulfasalazine Diarrhea and Rash   Prior to Admission medications   Medication Sig Start Date End Date Taking? Authorizing Provider  Donepezil HCl (ARICEPT PO) Take 25 mg by mouth at bedtime.   Yes Historical Provider, MD  Lactobacillus (ACIDOPHOLUS PO) Take 1 tablet by mouth daily.    Yes Historical Provider, MD  levothyroxine (SYNTHROID, LEVOTHROID) 50 MCG  tablet 50 mcg daily. 07/17/14  Yes Historical Provider, MD  methylcellulose (ARTIFICIAL TEARS) 1 % ophthalmic solution Place 1 drop into both eyes as needed (eye irritation).   Yes Historical Provider, MD  Multiple Vitamins-Minerals (MULTIVITAMIN WITH MINERALS) tablet Take 1 tablet by mouth daily.     Yes Historical Provider, MD  guaiFENesin (MUCINEX) 600 MG 12 hr tablet Take 600 mg by mouth 2 (two) times daily as needed for cough.    Historical Provider, MD      Review of Systems  Constitutional: Negative for fever.  Respiratory: Positive for shortness of breath.   All other systems reviewed and are negative.      Objective:   Physical Exam  Constitutional: She is oriented to person, place, and time. She appears well-developed and well-nourished.  HENT:  Head: Normocephalic and atraumatic.  Eyes: Conjunctivae and EOM are normal. Pupils are equal, round, and reactive to light.  Neck: Carotid bruit is not present.  Cardiovascular: Normal rate, regular rhythm, normal heart sounds and intact distal pulses.   No murmur heard. Pulmonary/Chest: Effort normal and breath sounds normal.  Abdominal: Soft. She exhibits no pulsatile midline mass. There is no tenderness.  Palpable abdominal pulse but no deviation   Musculoskeletal: She exhibits no edema.  Neurological: She is alert and oriented to person, place, and time.  Skin: Skin is warm and dry.  Psychiatric: She has a normal mood and affect. Her behavior is normal.  Vitals reviewed.    Filed Vitals:   08/11/14 1544  BP: 112/70  Pulse: 70  Temp: 97.9 F (36.6 C)  TempSrc: Oral  Resp: 16  Height: 5\' 8"  (1.727 m)  Weight: 129 lb 9.6 oz (58.786 kg)  SpO2: 97%    [facility] reading (PRIMARY) by  Dr. Carlota Osborn: CXR: pacemaker in place, no acute findings      Assessment & Plan:   [name] is a 80 y.o. female Shortness of breath - Plan: DG Chest 2 View  Malaise and fatigue  Dementia, without behavioral disturbance  Nonspecific  feeling of not taking a deep breath and fatigue.  Prior workup in ER reviewed from a few days ago reviewed, pacemaker evaluation yesterday ok. Only change that may be contributory to her current symptoms is cessation of Aricept 2 weeks ago.  Reassuring exam and prior labs.   -trial of restart Aricept.   -follow up with PCP and neurologist to discuss this med or changes if she would like.   -rtc precautions given.   Meds ordered this encounter  Medications  . Donepezil HCl (ARICEPT PO)    Sig: Take 25 mg by mouth at bedtime.   Patient Instructions  Your chest xray appears ok here  tonight, lungs are clear and oxygen normal. I would restart the Aricept and follow up with neurologist to discuss this medicine further. Return to the clinic or go to the nearest emergency room if any of your symptoms worsen or new symptoms occur.

## 2014-08-13 ENCOUNTER — Telehealth: Payer: Self-pay | Admitting: Cardiology

## 2014-08-13 NOTE — Telephone Encounter (Signed)
New Message    Patient c/o of weakness not being being able to walk without help. Patient needs an appt sooner than the appt on 08/21/14.

## 2014-08-13 NOTE — Telephone Encounter (Signed)
I spoke patient and patient's husband.  Pt states she "doesn't feel good"--she states she is generally tired, weak, and has no energy. This has gradually become worse recently. Pt does not know her heart rate or BP.  Pt denies chest pain or lightheadedness.  Pt states she went to Urgent Care on Saturday for shortness of breath, she had a CXR and was told that was [facility].  Pt has an appt with Dr Mare Ferrari 08/21/14 and is requesting a sooner appt.   I have scheduled pt with Dr Mare Ferrari 08/14/14. Pt and husband satisfied with this.

## 2014-08-14 ENCOUNTER — Encounter: Payer: Self-pay | Admitting: Cardiology

## 2014-08-14 ENCOUNTER — Ambulatory Visit (INDEPENDENT_AMBULATORY_CARE_PROVIDER_SITE_OTHER): Payer: Medicare Other | Admitting: Cardiology

## 2014-08-14 VITALS — BP 114/76 | HR 71 | Ht 68.0 in | Wt 124.8 lb

## 2014-08-14 DIAGNOSIS — F0390 Unspecified dementia without behavioral disturbance: Secondary | ICD-10-CM

## 2014-08-14 DIAGNOSIS — R634 Abnormal weight loss: Secondary | ICD-10-CM

## 2014-08-14 DIAGNOSIS — I495 Sick sinus syndrome: Secondary | ICD-10-CM

## 2014-08-14 DIAGNOSIS — F068 Other specified mental disorders due to known physiological condition: Secondary | ICD-10-CM

## 2014-08-14 NOTE — Patient Instructions (Signed)
Your physician recommends that you continue on your current medications as directed. Please refer to the Current Medication list given to you today.  Your physician wants you to follow-up in: 6 month ov/ekg  You will receive a reminder letter in the mail two months in advance. If you don't receive a letter, please call our office to schedule the follow-up appointment.   Try to walk more

## 2014-08-14 NOTE — Progress Notes (Signed)
Cardiology Office Note   Date:  08/14/2014   ID:  [name], DOB [birth year], MRN [id number]  PCP:  Elby Showers, MD  Cardiologist:    Darlin Coco, MD   No chief complaint on file.     History of Present Illness: [name] is a 80 y.o. female who presents for follow-up office visit  This 80 year old woman is seen for a scheduled followup office visit. She has a history of intermittent high-grade AV block and has a functioning St. Jude dual-chamber pacemaker. She is followed for this by Dr. Lovena Le. She has a history of hypercholesterolemia. She has a history of osteoarthritis. She has also had some memory issues and is followed by Dr. Brett Fairy. Since last visit she has had no new cardiac symptoms. She has not had any dizziness or syncope. No chest pain. She has had a past history of unintentional weight loss but since her last visit she has had no change in weight. The patient had a chest x-ray on 02/19/14 which showed no abnormality.  She complains of feeling tired and worn out.  She has had extensive lab work by her PCP which did not show any evidence of malignancy.  She had a CT scan of the abdomen and pelvis on 08/08/14 which did not show any abdominal aortic aneurysm or other cause of weight loss.  She complains of dyspnea and not being able to get a complete breath. She has not been having any dizziness or syncope.  No chest pain. Past Medical History  Diagnosis Date  . Hyperlipidemia   . Thyroid disease   . Arthritis   . Asthma   . Depression   . Colon polyps   . Environmental allergies   . Complete heart block -intermittent     a. 11/2012 s/p SJM Accent DR RF dc ppm.  . Osteoarthritis     hands  . Memory retention disorder 03/22/2013  . Allergy     Past Surgical History  Procedure Laterality Date  . Knee surgery    . Salpingoophorectomy      right  . Appendectomy  1965  . Colonoscopy  2007    adenomatous polyps  . Insert / replace / remove pacemaker   12/02/2012     Dr Lovena Le  . Hand surgery    . Ovarian cystectomy  1965  . Tumor removed  1985    abdomen  . Cervical polypectomy  1996  . Permanent pacemaker insertion N/A 12/02/2012    Procedure: PERMANENT PACEMAKER INSERTION;  Surgeon: Evans Lance, MD;  Location: [facility] CATH LAB;  Service: Cardiovascular;  Laterality: N/A;  . Eye surgery       Current Outpatient Prescriptions  Medication Sig Dispense Refill  . Donepezil HCl (ARICEPT PO) Take 25 mg by mouth at bedtime.    Marland Kitchen guaiFENesin (MUCINEX) 600 MG 12 hr tablet Take 600 mg by mouth 2 (two) times daily as needed for cough.    . Lactobacillus (ACIDOPHOLUS PO) Take 1 tablet by mouth daily.     Marland Kitchen levothyroxine (SYNTHROID, LEVOTHROID) 50 MCG tablet 50 mcg daily.    . methylcellulose (ARTIFICIAL TEARS) 1 % ophthalmic solution Place 1 drop into both eyes as needed (eye irritation).    . Multiple Vitamins-Minerals (MULTIVITAMIN WITH MINERALS) tablet Take 1 tablet by mouth daily.      . Omega-3 Fatty Acids (FISH OIL) 1000 MG CAPS Take 1,000 mg by mouth daily.     Current Facility-Administered Medications  Medication  Dose Route Frequency Provider Last Rate Last Dose  . pneumococcal 13-valent conjugate vaccine (PREVNAR 13) injection 0.5 mL  0.5 mL Intramuscular Once Elby Showers, MD        Allergies:   Sulfa antibiotics and Sulfasalazine    Social History:  The patient  reports that she has never smoked. She has never used smokeless tobacco. She reports that she drinks alcohol. She reports that she does not use illicit drugs.   Family History:  The patient's family history includes Colon cancer in her father; Colon polyps in her brother; Coronary artery disease in her brother; Diabetes in her brother.    ROS:  Please see the history of present illness.   Otherwise, review of systems are positive for none.   All other systems are reviewed and negative.    PHYSICAL EXAM: VS:  BP 114/76 mmHg  Pulse 71  Ht 5\' 8"  (1.727 m)  Wt 124 lb  12.8 oz (56.609 kg)  BMI 18.98 kg/m2 , BMI Body mass index is 18.98 kg/(m^2). GEN: Well nourished, well developed, in no acute distress HEENT: normal Neck: no JVD, carotid bruits, or masses Cardiac: RRR; no murmurs, rubs, or gallops,no edema  Respiratory:  clear to auscultation bilaterally, normal work of breathing GI: soft, nontender, nondistended, + BS MS: no deformity or atrophy Skin: warm and dry, no rash Neuro:  Strength and sensation are intact Psych: euthymic mood, full affect, mild dementia   Recent Labs: 03/05/2014: TSH 2.861 08/08/2014: ALT 20; BUN 16; Creatinine 0.92; Hemoglobin 13.7; Platelets 169; Potassium 3.7; Sodium 137    Lipid Panel    Component Value Date/Time   CHOL 191 10/17/2013 1119   TRIG 53.0 10/17/2013 1119   HDL 74.50 10/17/2013 1119   CHOLHDL 3 10/17/2013 1119   VLDL 10.6 10/17/2013 1119   LDLCALC 106* 10/17/2013 1119   LDLDIRECT 127.6 04/21/2013 1127      Wt Readings from Last 3 Encounters:  08/14/14 124 lb 12.8 oz (56.609 kg)  08/11/14 129 lb 9.6 oz (58.786 kg)  08/08/14 133 lb (60.328 kg)      Other studies Reviewed: Additional studies/ records that were reviewed today include: CT scan of abdomen and pelvis done on 08/08/14 Review of the above records demonstrates: No abdominal aortic aneurysm.  No cause of weight loss.   ASSESSMENT AND PLAN:  1. Functioning St. Jude dual-chamber pacemaker for intermittent high-grade AV block. 2. memory disorder 3. hypercholesterolemia followed by Dr. Renold Genta 4. Hypothyroidism 5. Unintentional weight loss, resolved  Disposition: Continue on same medication. Recheck in 6 months for office visit and EKG   Current medicines are reviewed at length with the patient today.  The patient does not have concerns regarding medicines.  The following changes have been made:  no change  Labs/ tests ordered today include:  No orders of the defined types were placed in this encounter.     Disposition:   FU  with Dr. Mare Ferrari in 6 months for office visit and EKG The patient was encouraged to walk more.  She will try to rejoin the Silver sneakers program to improve her strength and her walking stamina.   Signed, Darlin Coco, MD  08/14/2014 5:28 PM    [address], [place], [place]  [address] Phone: [phone]; Fax: [phone]

## 2014-08-21 ENCOUNTER — Ambulatory Visit: Payer: Self-pay | Admitting: Cardiology

## 2014-08-28 ENCOUNTER — Encounter: Payer: Self-pay | Admitting: Internal Medicine

## 2014-09-03 ENCOUNTER — Ambulatory Visit (INDEPENDENT_AMBULATORY_CARE_PROVIDER_SITE_OTHER): Payer: Medicare Other | Admitting: *Deleted

## 2014-09-03 DIAGNOSIS — I495 Sick sinus syndrome: Secondary | ICD-10-CM

## 2014-09-03 LAB — MDC_IDC_ENUM_SESS_TYPE_REMOTE
Battery Remaining Longevity: 114 mo
Battery Voltage: 3.01 V
Brady Statistic AP VP Percent: 16 %
Brady Statistic AS VP Percent: 26 %
Brady Statistic AS VS Percent: 54 %
Brady Statistic RA Percent Paced: 18 %
Brady Statistic RV Percent Paced: 42 %
Implantable Pulse Generator Model: 2210
Lead Channel Impedance Value: 380 Ohm
Lead Channel Pacing Threshold Amplitude: 0.5 V
Lead Channel Pacing Threshold Amplitude: 1.25 V
Lead Channel Pacing Threshold Pulse Width: 0.5 ms
Lead Channel Sensing Intrinsic Amplitude: 10.6 mV
Lead Channel Setting Pacing Amplitude: 2 V
Lead Channel Setting Pacing Amplitude: 2.5 V
Lead Channel Setting Sensing Sensitivity: 2 mV
MDC IDC MSMT BATTERY REMAINING PERCENTAGE: 95.5 %
MDC IDC MSMT LEADCHNL RA IMPEDANCE VALUE: 390 Ohm
MDC IDC MSMT LEADCHNL RA SENSING INTR AMPL: 3.2 mV
MDC IDC MSMT LEADCHNL RV PACING THRESHOLD PULSEWIDTH: 0.5 ms
MDC IDC PG SERIAL: 7474456
MDC IDC SESS DTM: 20160314063219
MDC IDC SET LEADCHNL RV PACING PULSEWIDTH: 0.5 ms
MDC IDC STAT BRADY AP VS PERCENT: 2.2 %

## 2014-09-03 NOTE — Progress Notes (Signed)
Remote pacemaker transmission.   

## 2014-09-11 ENCOUNTER — Encounter: Payer: Self-pay | Admitting: Internal Medicine

## 2014-09-12 ENCOUNTER — Telehealth: Payer: Self-pay | Admitting: Neurology

## 2014-09-12 NOTE — Telephone Encounter (Signed)
TC to home number no answer, left message

## 2014-09-12 NOTE — Telephone Encounter (Signed)
Pt's husband is calling stating that Donepezil HCl (ARICEPT PO) is not working very well and pt read something from [facility] and they said there are a couple new medications out for memory.  Pt was wondering if you could advise them about them.  Please call and advise.

## 2014-09-13 NOTE — Telephone Encounter (Signed)
Lovey Newcomer I have called this patients husband twice. He has not been available. On my previous visit he had mentioned the information from [facility]. I need to know what that information is before I can address the issue. It may be something I need to discuss with dr. Keturah Barre. Can you find that out. Thanks

## 2014-09-13 NOTE — Telephone Encounter (Signed)
TC to husband he is not at home per friend that answered. Left message that I will call back later.

## 2014-09-14 ENCOUNTER — Ambulatory Visit (INDEPENDENT_AMBULATORY_CARE_PROVIDER_SITE_OTHER): Payer: Medicare Other | Admitting: Internal Medicine

## 2014-09-14 VITALS — BP 108/62 | HR 65 | Temp 97.8°F | Resp 16 | Ht 68.0 in | Wt 129.2 lb

## 2014-09-14 DIAGNOSIS — R062 Wheezing: Secondary | ICD-10-CM | POA: Diagnosis not present

## 2014-09-14 DIAGNOSIS — J309 Allergic rhinitis, unspecified: Secondary | ICD-10-CM | POA: Diagnosis not present

## 2014-09-14 MED ORDER — FLUTICASONE PROPIONATE 50 MCG/ACT NA SUSP: NASAL | Status: DC

## 2014-09-14 MED ORDER — PREDNISONE 20 MG PO TABS: ORAL_TABLET | ORAL | Status: DC

## 2014-09-14 NOTE — Telephone Encounter (Signed)
TC to husband. Will leave starter pack of Namenda at front to pick up on Monday. Stop Aricept. He is agreeable

## 2014-09-14 NOTE — Telephone Encounter (Signed)
I called and spoke to husband.  Wife is wanting to start another medication which is better then aricept.  She lost paper on [facility] study that is going on about several drugs and one that may be better then aricept.   I told him that I would try to see if can find out information.  Namenda is another medication that is out there for dementia. Wife stated if better then aricept would like to try.  I would forward message to C Martin/NP.

## 2014-09-14 NOTE — Progress Notes (Signed)
Subjective:  This chart was scribed for Melissa Lin, MD by Molli Posey, Medical scribe. This patient was seen in ROOM 13 and the patient's care was started 3:06 PM.   Patient ID: [name], female    DOB: February 12, 1935, 80 y.o.   MRN: [id number]   Chief Complaint  Patient presents with  . Nasal Congestion  . Cough    Somewhat productive   HPI HPI Comments: [name] is a 80 y.o. female with a history of memory retention disorder who presents to [facility] complaining of nasal congestion for the last 3 days. Pt reports associated rhinorrhea, wheezing and a somewhat productive cough. She states that she has been sneezing frequently and has been experiencing chest tightness as well. She says that she has been taking allegra for her symptoms. Pt states that she has a history of asthma. Pt reports no modifying factors at this time. She states that she does not have inhalers at home. Pt denies fever.   Patient Active Problem List   Diagnosis Date Noted  . Weight loss, unintentional 02/19/2014  . Dementia arising in the senium and presenium 01/30/2014  . 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 Memory loss 07/07/2013  . Memory retention disorder 03/22/2013  . Pacemaker 03/09/2013  . Complete heart block -intermittent   . Syncope 11/18/2012  . Personal history of adenomatous colonic polyps 11/12/2011  . History of TIA (transient ischemic attack) 09/19/2011  . Hypothyroidism 01/13/2011  . Hyperlipidemia 01/13/2011  . First degree AV block 01/13/2011  . Gait disturbance 01/13/2011   Past Medical History  Diagnosis Date  . Hyperlipidemia   . Thyroid disease   . Arthritis   . Asthma   . Depression   . Colon polyps   . Environmental allergies   . Complete heart block -intermittent     a. 11/2012 s/p SJM Accent DR RF dc ppm.  . Osteoarthritis     hands  . Memory retention disorder 03/22/2013  . Allergy    Allergies  Allergen  Reactions  . Sulfa Antibiotics Diarrhea and Rash  . Sulfasalazine Diarrhea and Rash   Current Outpatient Prescriptions on File Prior to Visit  Medication Sig Dispense Refill  . guaiFENesin (MUCINEX) 600 MG 12 hr tablet Take 600 mg by mouth 2 (two) times daily as needed for cough.    . Lactobacillus (ACIDOPHOLUS PO) Take 1 tablet by mouth daily.     Marland Kitchen levothyroxine (SYNTHROID, LEVOTHROID) 50 MCG tablet 50 mcg daily.    . methylcellulose (ARTIFICIAL TEARS) 1 % ophthalmic solution Place 1 drop into both eyes as needed (eye irritation).    . Multiple Vitamins-Minerals (MULTIVITAMIN WITH MINERALS) tablet Take 1 tablet by mouth daily.      . memantine (NAMENDA XR) 28 MG CP24 24 hr capsule Take 28 mg by mouth daily.    . Omega-3 Fatty Acids (FISH OIL) 1000 MG CAPS Take 1,000 mg by mouth daily.     Current Facility-Administered Medications on File Prior to Visit  Medication Dose Route Frequency Provider Last Rate Last Dose  . pneumococcal 13-valent conjugate vaccine (PREVNAR 13) injection 0.5 mL  0.5 mL Intramuscular Once Elby Showers, MD       Past Surgical History  Procedure Laterality Date  . Knee surgery    . Salpingoophorectomy      right  . Appendectomy  1965  . Colonoscopy  2007    adenomatous polyps  . Insert / replace /  remove pacemaker  12/02/2012     Dr Lovena Le  . Hand surgery    . Ovarian cystectomy  1965  . Tumor removed  1985    abdomen  . Cervical polypectomy  1996  . Permanent pacemaker insertion N/A 12/02/2012    Procedure: PERMANENT PACEMAKER INSERTION;  Surgeon: Evans Lance, MD;  Location: [facility] CATH LAB;  Service: Cardiovascular;  Laterality: N/A;  . Eye surgery     Family History  Problem Relation Age of Onset  . Colon cancer Father   . Colon polyps Brother   . Diabetes Brother     maternal aunt  . Coronary artery disease Brother    History   Social History  . Marital Status: Married    Spouse Name: [name]  . Number of Children: 3  . Years of Education:  College   Occupational History  . elem. school teacher     retired   Social History Main Topics  . Smoking status: Never Smoker   . Smokeless tobacco: Never Used  . Alcohol Use: 0.0 oz/week    0 Standard drinks or equivalent per week     Comment: occas. which is seldom  . Drug Use: No  . Sexual Activity: Not on file   Other Topics Concern  . Not on file   Social History Narrative   Patient has one daughter, two adopted children(son,daughter).   Patient lives at home with spouse [name]).   Patient is retired.   Patient has a college education.   Patient is right-handed.   Patient drinks very little caffeine.   Review of Systems  Constitutional: Negative for fever.  HENT: Positive for congestion, postnasal drip and rhinorrhea.   Respiratory: Positive for cough, chest tightness and wheezing.   All other systems reviewed and are negative.     Objective:   Physical Exam  Constitutional: She is oriented to person, place, and time. She appears well-developed and well-nourished. No distress.  HENT:  Head: Normocephalic and atraumatic.  Naris with clear rhinorrhea. Throat clear.   Eyes: Conjunctivae and EOM are normal. Pupils are equal, round, and reactive to light.  Conjunctiva injected.   Neck: Neck supple.  No cervical nodes.  Cardiovascular: Normal rate.   Pulmonary/Chest: Effort normal. She has wheezes.  Chest has wheezing in all lung fields with forced expiration.   Neurological: She is alert and oriented to person, place, and time. No cranial nerve deficit.  Psychiatric: She has a normal mood and affect. Her behavior is normal.  Nursing note and vitals reviewed.  Filed Vitals:   09/14/14 1453  BP: 108/62  Pulse: 65  Temp: 97.8 F (36.6 C)  TempSrc: Oral  Resp: 16  Height: 5\' 8"  (1.727 m)  Weight: 129 lb 3.2 oz (58.605 kg)  SpO2: 97%      Assessment & Plan:   I have completed the patient encounter in its entirety as documented by the scribe, with  editing by me where necessary. Luzelena Heeg P. Laney Pastor, M.D.  Problem #1 allergic rhinitis Problem #2 allergic bronchospasm  She has Allegra to take on a daily basis Meds ordered this encounter  Medications  . predniSONE (DELTASONE) 20 MG tablet    Sig: 3/3/2/2/1/1 single daily dose for 6 days    Dispense:  12 tablet    Refill:  0  . fluticasone (FLONASE) 50 MCG/ACT nasal spray    Sig: 1 spray each nostril bid for 1-2 months    Dispense:  16 g  Refill:  6   Follow-up 3-5 days if not better

## 2014-09-14 NOTE — Patient Instructions (Signed)
Symptoms are coming from allergic reaction to the pollen You need to take Allegra every day for the next 1-2 months You need to take a six-day course of prednisone which I sent to your pharmacy You need to start a nasal spray to use 1 spray in each nostril twice a day for the next 2-3 months to prevent further allergic reactions You should be much better in the next 48 hours and if not we will be glad to check you again next week

## 2014-10-02 ENCOUNTER — Ambulatory Visit (INDEPENDENT_AMBULATORY_CARE_PROVIDER_SITE_OTHER): Payer: Medicare Other | Admitting: Internal Medicine

## 2014-10-02 ENCOUNTER — Ambulatory Visit (INDEPENDENT_AMBULATORY_CARE_PROVIDER_SITE_OTHER): Payer: Medicare Other

## 2014-10-02 VITALS — BP 110/60 | HR 71 | Temp 98.2°F | Resp 16 | Ht 68.0 in | Wt 133.0 lb

## 2014-10-02 DIAGNOSIS — R05 Cough: Secondary | ICD-10-CM

## 2014-10-02 DIAGNOSIS — R058 Other specified cough: Secondary | ICD-10-CM

## 2014-10-02 MED ORDER — HYDROCODONE-HOMATROPINE 5-1.5 MG/5ML PO SYRP: ORAL_SOLUTION | ORAL | Status: DC

## 2014-10-02 MED ORDER — PREDNISONE 20 MG PO TABS: ORAL_TABLET | ORAL | Status: DC

## 2014-10-02 MED ORDER — BENZONATATE 100 MG PO CAPS: 100.0000 mg | ORAL_CAPSULE | Freq: Three times a day (TID) | ORAL | Status: DC | PRN

## 2014-10-02 NOTE — Progress Notes (Signed)
Subjective:  This chart was scribed for Melissa Lin, MD by [facility], medical scribe at [facility].The patient was seen in exam room 09 and the patient's care was started at 4:59 PM.   Patient ID: [name], female    DOB: 05-Jul-1934, 80 y.o.   MRN: [id number] Chief Complaint  Patient presents with  . Follow-up    cough no better   HPI HPI Comments: [name] is a 80 y.o. female who presents to [facility] for a follow up for a persistent cough worse at night. She describes the cough as a dry "tickle". Pt states she feels like there is mucous stuck in her throat that makes her cough. She denies fever, SOB with activity, indigestion, post nasal drip.  Patient Active Problem List   Diagnosis Date Noted  . Weight loss, unintentional 02/19/2014  . Dementia arising in the senium and presenium 01/30/2014  . 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 Memory loss 07/07/2013  . Memory retention disorder 03/22/2013  . Pacemaker 03/09/2013  . Complete heart block -intermittent   . Syncope 11/18/2012  . Personal history of adenomatous colonic polyps 11/12/2011  . History of TIA (transient ischemic attack) 09/19/2011  . Hypothyroidism 01/13/2011  . Hyperlipidemia 01/13/2011  . First degree AV block 01/13/2011  . Gait disturbance 01/13/2011   Past Medical History  Diagnosis Date  . Hyperlipidemia   . Thyroid disease   . Arthritis   . Asthma   . Depression   . Colon polyps   . Environmental allergies   . Complete heart block -intermittent     a. 11/2012 s/p SJM Accent DR RF dc ppm.  . Osteoarthritis     hands  . Memory retention disorder 03/22/2013  . Allergy    Past Surgical History  Procedure Laterality Date  . Knee surgery    . Salpingoophorectomy      right  . Appendectomy  1965  . Colonoscopy  2007    adenomatous polyps  . Insert / replace / remove pacemaker  12/02/2012       Dr Lovena Le  . Hand surgery    . Ovarian cystectomy  1965  . Tumor removed  1985    abdomen  . Cervical polypectomy  1996  . Permanent pacemaker insertion N/A 12/02/2012    Procedure: PERMANENT PACEMAKER INSERTION;  Surgeon: Evans Lance, MD;  Location: [facility] CATH LAB;  Service: Cardiovascular;  Laterality: N/A;  . Eye surgery     Allergies  Allergen Reactions  . Sulfa Antibiotics Diarrhea and Rash  . Sulfasalazine Diarrhea and Rash   Prior to Admission medications   Medication Sig Start Date End Date Taking? Authorizing Provider  fluticasone (FLONASE) 50 MCG/ACT nasal spray 1 spray each nostril bid for 1-2 months 09/14/14  Yes Leandrew Koyanagi, MD  guaiFENesin (MUCINEX) 600 MG 12 hr tablet Take 600 mg by mouth 2 (two) times daily as needed for cough.   Yes Historical Provider, MD  Lactobacillus (ACIDOPHOLUS PO) Take 1 tablet by mouth daily.    Yes Historical Provider, MD  levothyroxine (SYNTHROID, LEVOTHROID) 50 MCG tablet 50 mcg daily. 07/17/14  Yes Historical Provider, MD  memantine (NAMENDA XR) 28 MG CP24 24 hr capsule Take 28 mg by mouth daily.   Yes Historical Provider, MD  methylcellulose (ARTIFICIAL TEARS) 1 % ophthalmic solution Place 1 drop into both eyes as needed (eye irritation).   Yes  Historical Provider, MD  Multiple Vitamins-Minerals (MULTIVITAMIN WITH MINERALS) tablet Take 1 tablet by mouth daily.     Yes Historical Provider, MD  Omega-3 Fatty Acids (FISH OIL) 1000 MG CAPS Take 1,000 mg by mouth daily.   Yes Historical Provider, MD  predniSONE (DELTASONE) 20 MG tablet 3/3/2/2/1/1 single daily dose for 6 days 09/14/14  Yes Leandrew Koyanagi, MD   [place] Review of Systems  Constitutional: Negative for fever, activity change, appetite change and unexpected weight change.  HENT: Negative for postnasal drip, sinus pressure, sneezing, sore throat and voice change.        Has to clear throat often  Respiratory: Positive for cough. Negative for choking, shortness of  breath and wheezing.   Cardiovascular: Negative for chest pain and leg swelling.  Gastrointestinal: Negative for abdominal pain.       No hx reflux/htburn/indiges      Objective:  BP 110/60 mmHg  Pulse 71  Temp(Src) 98.2 F (36.8 C) (Oral)  Resp 16  Ht 5\' 8"  (1.727 m)  Wt 133 lb (60.328 kg)  BMI 20.23 kg/m2  SpO2 97% Physical Exam  Constitutional: She is oriented to person, place, and time. She appears well-developed and well-nourished. No distress.  HENT:  Head: Normocephalic and atraumatic.  Nose: Nose normal.  Mouth/Throat: Oropharynx is clear and moist. No oropharyngeal exudate.  Eyes: Pupils are equal, round, and reactive to light.  Neck: Normal range of motion. No thyromegaly present.  Cardiovascular: Normal rate and regular rhythm.   Pulmonary/Chest: Effort normal and breath sounds normal. No respiratory distress. She has no wheezes. She has no rales.  Musculoskeletal: Normal range of motion.  Lymphadenopathy:    She has no cervical adenopathy.  Neurological: She is alert and oriented to person, place, and time.  Skin: Skin is warm and dry.  Psychiatric: She has a normal mood and affect. Her behavior is normal.  Nursing note and vitals reviewed. [facility] reading (PRIMARY) by  Dr. Stasia Cavalier effusion or infiltrate/? R Hilar area with increase vessels      Assessment & Plan:  Cough present for greater than 3 weeks - Plan: DG Chest 2 View ?etio  Restart flonase Meds ordered this encounter  Medications  . predniSONE (DELTASONE) 20 MG tablet    Sig: 3/3/2/2/1/1 single daily dose for 6 days    Dispense:  12 tablet    Refill:  0  . benzonatate (TESSALON) 100 MG capsule    Sig: Take 1 capsule (100 mg total) by mouth 3 (three) times daily as needed for cough.    Dispense:  30 capsule    Refill:  1  . HYDROcodone-homatropine (HYCODAN) 5-1.5 MG/5ML syrup    Sig: 40ml at hs for cough--may repeat in 5-6 hrs if needed    Dispense:  120 mL    Refill:  0       I have  completed the patient encounter in its entirety as documented by the scribe, with editing by me where necessary. Labrisha Wuellner P. Laney Pastor, M.D.

## 2014-10-10 ENCOUNTER — Encounter: Payer: Self-pay | Admitting: Internal Medicine

## 2014-10-12 ENCOUNTER — Telehealth: Payer: Self-pay | Admitting: Neurology

## 2014-10-12 MED ORDER — MEMANTINE HCL ER 28 MG PO CP24: 28.0000 mg | ORAL_CAPSULE | Freq: Every day | ORAL | Status: DC

## 2014-10-12 NOTE — Telephone Encounter (Signed)
Patient's husband is calling in regard to wife's Namenda XR 28 mg. She is starting on 4th week so he wants to know if a Rx will be sent in for refill to [facility] on [address].   Please call.

## 2014-10-12 NOTE — Telephone Encounter (Signed)
Rx has been sent.  I called back to advise.  They are aware.  

## 2014-11-29 ENCOUNTER — Ambulatory Visit (INDEPENDENT_AMBULATORY_CARE_PROVIDER_SITE_OTHER): Payer: Medicare Other | Admitting: Internal Medicine

## 2014-11-29 ENCOUNTER — Encounter: Payer: Self-pay | Admitting: Internal Medicine

## 2014-11-29 VITALS — BP 114/62 | HR 76 | Ht 68.0 in | Wt 138.8 lb

## 2014-11-29 DIAGNOSIS — I442 Atrioventricular block, complete: Secondary | ICD-10-CM

## 2014-11-29 DIAGNOSIS — R634 Abnormal weight loss: Secondary | ICD-10-CM

## 2014-11-29 DIAGNOSIS — Z950 Presence of cardiac pacemaker: Secondary | ICD-10-CM | POA: Diagnosis not present

## 2014-11-29 DIAGNOSIS — R55 Syncope and collapse: Secondary | ICD-10-CM

## 2014-11-29 LAB — CUP PACEART INCLINIC DEVICE CHECK
Battery Remaining Longevity: 127.2 mo
Battery Voltage: 3.01 V
Date Time Interrogation Session: 20160609155250
Lead Channel Impedance Value: 412.5 Ohm
Lead Channel Impedance Value: 425 Ohm
Lead Channel Pacing Threshold Amplitude: 1.25 V
Lead Channel Pacing Threshold Pulse Width: 0.5 ms
Lead Channel Sensing Intrinsic Amplitude: 12 mV
Lead Channel Sensing Intrinsic Amplitude: 4.1 mV
Lead Channel Setting Pacing Amplitude: 2 V
Lead Channel Setting Pacing Amplitude: 2.5 V
Lead Channel Setting Pacing Pulse Width: 0.5 ms
Lead Channel Setting Sensing Sensitivity: 2 mV
MDC IDC MSMT LEADCHNL RA PACING THRESHOLD AMPLITUDE: 0.75 V
MDC IDC MSMT LEADCHNL RA PACING THRESHOLD PULSEWIDTH: 0.5 ms
MDC IDC STAT BRADY RA PERCENT PACED: 12 %
MDC IDC STAT BRADY RV PERCENT PACED: 35 %
Pulse Gen Serial Number: 7474456

## 2014-11-29 NOTE — Patient Instructions (Signed)
Medication Instructions:  Your physician recommends that you continue on your current medications as directed. Please refer to the Current Medication list given to you today.   Labwork: None ordered  Testing/Procedures: None ordered  Follow-Up: Your physician wants you to follow-up in: 12 months with Dr Taylor You will receive a reminder letter in the mail two months in advance. If you don't receive a letter, please call our office to schedule the follow-up appointment.  Remote monitoring is used to monitor your Pacemaker of ICD from home. This monitoring reduces the number of office visits required to check your device to one time per year. It allows us to keep an eye on the functioning of your device to ensure it is working properly. You are scheduled for a device check from home on 02/28/15. You may send your transmission at any time that day. If you have a wireless device, the transmission will be sent automatically. After your physician reviews your transmission, you will receive a postcard with your next transmission date.    Any Other Special Instructions Will Be Listed Below (If Applicable).   

## 2014-12-03 NOTE — Assessment & Plan Note (Signed)
No recurrent episodes since her PPM. Will follow.

## 2014-12-03 NOTE — Progress Notes (Signed)
HPI [name] returns today for followup. She is a very pleasant 80 year old woman with a history of symptomatic bradycardia, secondary to sinus node dysfunction, status post permanent pacemaker insertion. She also has a history of heart block. In the interim, she has been stable. She denies chest pain. No peripheral edema. No symptomatic SVT. Allergies  Allergen Reactions  . Sulfa Antibiotics Diarrhea and Rash  . Sulfasalazine Diarrhea and Rash     Current Outpatient Prescriptions  Medication Sig Dispense Refill  . benzonatate (TESSALON) 100 MG capsule Take 1 capsule (100 mg total) by mouth 3 (three) times daily as needed for cough. 30 capsule 1  . fluticasone (FLONASE) 50 MCG/ACT nasal spray 1 spray each nostril bid for 1-2 months 16 g 6  . guaiFENesin (MUCINEX) 600 MG 12 hr tablet Take 600 mg by mouth 2 (two) times daily as needed for cough.    Marland Kitchen HYDROcodone-homatropine (HYCODAN) 5-1.5 MG/5ML syrup 34ml at hs for cough--may repeat in 5-6 hrs if needed 120 mL 0  . Lactobacillus (ACIDOPHOLUS PO) Take 1 tablet by mouth daily.     Marland Kitchen levothyroxine (SYNTHROID, LEVOTHROID) 50 MCG tablet 50 mcg daily.    . memantine (NAMENDA XR) 28 MG CP24 24 hr capsule Take 1 capsule (28 mg total) by mouth daily. 30 capsule 3  . methylcellulose (ARTIFICIAL TEARS) 1 % ophthalmic solution Place 1 drop into both eyes as needed (eye irritation).    . Multiple Vitamins-Minerals (MULTIVITAMIN WITH MINERALS) tablet Take 1 tablet by mouth daily.      . Omega-3 Fatty Acids (FISH OIL) 1000 MG CAPS Take 1,000 mg by mouth daily.     Current Facility-Administered Medications  Medication Dose Route Frequency Provider Last Rate Last Dose  . pneumococcal 13-valent conjugate vaccine (PREVNAR 13) injection 0.5 mL  0.5 mL Intramuscular Once Elby Showers, MD         Past Medical History  Diagnosis Date  . Hyperlipidemia   . Thyroid disease   . Arthritis   . Asthma   . Depression   . Colon polyps   . Environmental  allergies   . Complete heart block -intermittent     a. 11/2012 s/p SJM Accent DR RF dc ppm.  . Osteoarthritis     hands  . Memory retention disorder 03/22/2013  . Allergy     ROS:   All systems reviewed and negative except as noted in the HPI.   Past Surgical History  Procedure Laterality Date  . Knee surgery    . Salpingoophorectomy      right  . Appendectomy  1965  . Colonoscopy  2007    adenomatous polyps  . Insert / replace / remove pacemaker  12/02/2012     Dr Lovena Le  . Hand surgery    . Ovarian cystectomy  1965  . Tumor removed  1985    abdomen  . Cervical polypectomy  1996  . Permanent pacemaker insertion N/A 12/02/2012    Procedure: PERMANENT PACEMAKER INSERTION;  Surgeon: Evans Lance, MD;  Location: [facility] CATH LAB;  Service: Cardiovascular;  Laterality: N/A;  . Eye surgery       Family History  Problem Relation Age of Onset  . Colon cancer Father   . Colon polyps Brother   . Diabetes Brother     maternal aunt  . Coronary artery disease Brother      History   Social History  . Marital Status: Married    Spouse Name: [name]  . Number  of Children: 3  . Years of Education: College   Occupational History  . elem. school teacher     retired   Social History Main Topics  . Smoking status: Never Smoker   . Smokeless tobacco: Never Used  . Alcohol Use: 0.0 oz/week    0 Standard drinks or equivalent per week     Comment: occas. which is seldom  . Drug Use: No  . Sexual Activity: Not on file   Other Topics Concern  . Not on file   Social History Narrative   Patient has one daughter, two adopted children(son,daughter).   Patient lives at home with spouse [name]).   Patient is retired.   Patient has a college education.   Patient is right-handed.   Patient drinks very little caffeine.     BP 114/62 mmHg  Pulse 76  Ht 5\' 8"  (1.727 m)  Wt 138 lb 12.8 oz (62.959 kg)  BMI 21.11 kg/m2  SpO2 97%  Physical Exam:  Well appearing 80 year old  woman, NAD HEENT: Unremarkable Neck:  6 cm JVD, no thyromegally Back:  No CVA tenderness Lungs:  Clear with no wheezes, rales, or rhonchi. Well healed PPM incision HEART:  Regular rate rhythm, no murmurs, no rubs, no clicks Abd:  soft, positive bowel sounds, no organomegally, no rebound, no guarding Ext:  2 plus pulses, no edema, no cyanosis, no clubbing Skin:  No rashes no nodules Neuro:  CN II through XII intact, motor grossly intact   DEVICE  Normal device function.  See PaceArt for details.   Assess/Plan:

## 2014-12-03 NOTE — Assessment & Plan Note (Signed)
She is encouraged to increase her oral intake.

## 2014-12-03 NOTE — Assessment & Plan Note (Signed)
Her [employer] DDD PM is working normally. Will recheck in several months.  

## 2014-12-23 ENCOUNTER — Other Ambulatory Visit: Payer: Self-pay | Admitting: Internal Medicine

## 2014-12-29 ENCOUNTER — Ambulatory Visit (INDEPENDENT_AMBULATORY_CARE_PROVIDER_SITE_OTHER): Payer: Medicare Other

## 2014-12-29 ENCOUNTER — Ambulatory Visit (INDEPENDENT_AMBULATORY_CARE_PROVIDER_SITE_OTHER): Payer: Medicare Other | Admitting: Family Medicine

## 2014-12-29 VITALS — BP 108/70 | HR 77 | Temp 97.9°F | Resp 16 | Ht 68.0 in | Wt 140.0 lb

## 2014-12-29 DIAGNOSIS — M25551 Pain in right hip: Secondary | ICD-10-CM

## 2014-12-29 DIAGNOSIS — M65851 Other synovitis and tenosynovitis, right thigh: Secondary | ICD-10-CM

## 2014-12-29 DIAGNOSIS — K5900 Constipation, unspecified: Secondary | ICD-10-CM | POA: Diagnosis not present

## 2014-12-29 DIAGNOSIS — R937 Abnormal findings on diagnostic imaging of other parts of musculoskeletal system: Secondary | ICD-10-CM

## 2014-12-29 DIAGNOSIS — M1611 Unilateral primary osteoarthritis, right hip: Secondary | ICD-10-CM

## 2014-12-29 DIAGNOSIS — M76891 Other specified enthesopathies of right lower limb, excluding foot: Secondary | ICD-10-CM

## 2014-12-29 MED ORDER — POLYETHYLENE GLYCOL 3350 17 GM/SCOOP PO POWD: 17.0000 g | Freq: Every day | ORAL | Status: DC

## 2014-12-29 NOTE — Patient Instructions (Signed)
Hip Exercises RANGE OF MOTION (ROM) AND STRETCHING EXERCISES  These exercises may help you when beginning to rehabilitate your injury. Doing them too aggressively can worsen your condition. Complete them slowly and gently. Your symptoms may resolve with or without further involvement from your physician, physical therapist or athletic trainer. While completing these exercises, remember:   Restoring tissue flexibility helps normal motion to return to the joints. This allows healthier, less painful movement and activity.  An effective stretch should be held for at least 30 seconds.  A stretch should never be painful. You should only feel a gentle lengthening or release in the stretched tissue. If these stretches worsen your symptoms even when done gently, consult your physician, physical therapist or athletic trainer. STRETCH - Hamstrings, Supine   Lie on your back. Loop a belt or towel over the ball of your right / left foot.  Straighten your right / left knee and slowly pull on the belt to raise your leg. Do not allow the right / left knee to bend. Keep your opposite leg flat on the floor.  Raise the leg until you feel a gentle stretch behind your right / left knee or thigh. Hold this position for __________ seconds. Repeat __________ times. Complete this stretch __________ times per day.  STRETCH - Hip Rotators   Lie on your back on a firm surface. Grasp your right / left knee with your right / left hand and your ankle with your opposite hand.  Keeping your hips and shoulders firmly planted, gently pull your right / left knee and rotate your lower leg toward your opposite shoulder until you feel a stretch in your buttocks.  Hold this stretch for __________ seconds. Repeat this stretch __________ times. Complete this stretch __________ times per day. STRETCH - Hamstrings/Adductors, V-Sit   Sit on the floor with your legs extended in a large "V," keeping your knees straight.  With your  head and chest upright, bend at your waist reaching for your right foot to stretch your left adductors.  You should feel a stretch in your left inner thigh. Hold for __________ seconds.  Return to the upright position to relax your leg muscles.  Continuing to keep your chest upright, bend straight forward at your waist to stretch your hamstrings.  You should feel a stretch behind both of your thighs and/or knees. Hold for __________ seconds.  Return to the upright position to relax your leg muscles.  Repeat steps 2 through 4 for opposite leg. Repeat __________ times. Complete this exercise __________ times per day.  STRETCHING - Hip Flexors, Lunge  Half kneel with your right / left knee on the floor and your opposite knee bent and directly over your ankle.  Keep good posture with your head over your shoulders. Tighten your buttocks to point your tailbone downward; this will prevent your back from arching too much.  You should feel a gentle stretch in the front of your thigh and/or hip. If you do not feel any resistance, slightly slide your opposite foot forward and then slowly lunge forward so your knee once again lines up over your ankle. Be sure your tailbone remains pointed downward.  Hold this stretch for __________ seconds. Repeat __________ times. Complete this stretch __________ times per day. STRENGTHENING EXERCISES These exercises may help you when beginning to rehabilitate your injury. They may resolve your symptoms with or without further involvement from your physician, physical therapist or athletic trainer. While completing these exercises, remember:   Muscles  can gain both the endurance and the strength needed for everyday activities through controlled exercises.  Complete these exercises as instructed by your physician, physical therapist or athletic trainer. Progress the resistance and repetitions only as guided.  You may experience muscle soreness or fatigue, but the  pain or discomfort you are trying to eliminate should never worsen during these exercises. If this pain does worsen, stop and make certain you are following the directions exactly. If the pain is still present after adjustments, discontinue the exercise until you can discuss the trouble with your clinician. STRENGTH - Hip Extensors, Bridge   Lie on your back on a firm surface. Bend your knees and place your feet flat on the floor.  Tighten your buttocks muscles and lift your bottom off the floor until your trunk is level with your thighs. You should feel the muscles in your buttocks and back of your thighs working. If you do not feel these muscles, slide your feet 1-2 inches further away from your buttocks.  Hold this position for __________ seconds.  Slowly lower your hips to the starting position and allow your buttock muscles relax completely before beginning the next repetition.  If this exercise is too easy, you may cross your arms over your chest. Repeat __________ times. Complete this exercise __________ times per day.  STRENGTH - Hip Abductors, Straight Leg Raises  Be aware of your form throughout the entire exercise so that you exercise the correct muscles. Sloppy form means that you are not strengthening the correct muscles.  Lie on your side so that your head, shoulders, knee and hip line up. You may bend your lower knee to help maintain your balance. Your right / left leg should be on top.  Roll your hips slightly forward, so that your hips are stacked directly over each other and your right / left knee is facing forward.  Lift your top leg up 4-6 inches, leading with your heel. Be sure that your foot does not drift forward or that your knee does not roll toward the ceiling.  Hold this position for __________ seconds. You should feel the muscles in your outer hip lifting (you may not notice this until your leg begins to tire).  Slowly lower your leg to the starting position. Allow  the muscles to fully relax before beginning the next repetition. Repeat __________ times. Complete this exercise __________ times per day.  STRENGTH - Hip Adductors, Straight Leg Raises   Lie on your side so that your head, shoulders, knee and hip line up. You may place your upper foot in front to help maintain your balance. Your right / left leg should be on the bottom.  Roll your hips slightly forward, so that your hips are stacked directly over each other and your right / left knee is facing forward.  Tense the muscles in your inner thigh and lift your bottom leg 4-6 inches. Hold this position for __________ seconds.  Slowly lower your leg to the starting position. Allow the muscles to fully relax before beginning the next repetition. Repeat __________ times. Complete this exercise __________ times per day.  STRENGTH - Quadriceps, Straight Leg Raises  Quality counts! Watch for signs that the quadriceps muscle is working to insure you are strengthening the correct muscles and not "cheating" by substituting with healthier muscles.  Lay on your back with your right / left leg extended and your opposite knee bent.  Tense the muscles in the front of your right / left  thigh. You should see either your knee cap slide up or increased dimpling just above the knee. Your thigh may even quiver.  Tighten these muscles even more and raise your leg 4 to 6 inches off the floor. Hold for right / left seconds.  Keeping these muscles tense, lower your leg.  Relax the muscles slowly and completely in between each repetition. Repeat __________ times. Complete this exercise __________ times per day.  STRENGTH - Hip Abductors, Standing  Tie one end of a rubber exercise band/tubing to a secure surface (table, pole) and tie a loop at the other end.  Place the loop around your right / left ankle. Keeping your ankle with the band directly opposite of the secured end, step away until there is tension in the  tube/band.  Hold onto a chair as needed for balance.  Keeping your back upright, your shoulders over your hips, and your toes pointing forward, lift your right / left leg out to your side. Be sure to lift your leg with your hip muscles. Do not "throw" your leg or tip your body to lift your leg.  Slowly and with control, return to the starting position. Repeat exercise __________ times. Complete this exercise __________ times per day.  STRENGTH - Quadriceps, Squats  Stand in a door frame so that your feet and knees are in line with the frame.  Use your hands for balance, not support, on the frame.  Slowly lower your weight, bending at the hips and knees. Keep your lower legs upright so that they are parallel with the door frame. Squat only within the range that does not increase your knee pain. Never let your hips drop below your knees.  Slowly return upright, pushing with your legs, not pulling with your hands. Document Released: 06/26/2005 Document Revised: 08/31/2011 Document Reviewed: 09/20/2008 [facility] Patient Information 2015 [place], [place]. This information is not intended to replace advice given to you by your health care provider. Make sure you discuss any questions you have with your health care provider.  Constipation Constipation is when a person has fewer than three bowel movements a week, has difficulty having a bowel movement, or has stools that are dry, hard, or larger than normal. As people grow older, constipation is more common. If you try to fix constipation with medicines that make you have a bowel movement (laxatives), the problem may get worse. Long-term laxative use may cause the muscles of the colon to become weak. A low-fiber diet, not taking in enough fluids, and taking certain medicines may make constipation worse.  CAUSES   Certain medicines, such as antidepressants, pain medicine, iron supplements, antacids, and water pills.   Certain diseases, such as  diabetes, irritable bowel syndrome (IBS), thyroid disease, or depression.   Not drinking enough water.   Not eating enough fiber-rich foods.   Stress or travel.   Lack of physical activity or exercise.   Ignoring the urge to have a bowel movement.   Using laxatives too much.  SIGNS AND SYMPTOMS   Having fewer than three bowel movements a week.   Straining to have a bowel movement.   Having stools that are hard, dry, or larger than normal.   Feeling full or bloated.   Pain in the lower abdomen.   Not feeling relief after having a bowel movement.  DIAGNOSIS  Your health care provider will take a medical history and perform a physical exam. Further testing may be done for severe constipation. Some tests may include:  A barium enema X-ray to examine your rectum, colon, and, sometimes, your small intestine.   A sigmoidoscopy to examine your lower colon.   A colonoscopy to examine your entire colon. TREATMENT  Treatment will depend on the severity of your constipation and what is causing it. Some dietary treatments include drinking more fluids and eating more fiber-rich foods. Lifestyle treatments may include regular exercise. If these diet and lifestyle recommendations do not help, your health care provider may recommend taking over-the-counter laxative medicines to help you have bowel movements. Prescription medicines may be prescribed if over-the-counter medicines do not work.  HOME CARE INSTRUCTIONS   Eat foods that have a lot of fiber, such as fruits, vegetables, whole grains, and beans.  Limit foods high in fat and processed sugars, such as french fries, hamburgers, cookies, candies, and soda.   A fiber supplement may be added to your diet if you cannot get enough fiber from foods.   Drink enough fluids to keep your urine clear or pale yellow.   Exercise regularly or as directed by your health care provider.   Go to the restroom when you have the  urge to go. Do not hold it.   Only take over-the-counter or prescription medicines as directed by your health care provider. Do not take other medicines for constipation without talking to your health care provider first.  [place] IF:   You have bright red blood in your stool.   Your constipation lasts for more than 4 days or gets worse.   You have abdominal or rectal pain.   You have thin, pencil-like stools.   You have unexplained weight loss. MAKE SURE YOU:   Understand these instructions.  Will watch your condition.  Will get help right away if you are not doing well or get worse. Document Released: 03/06/2004 Document Revised: 06/13/2013 Document Reviewed: 03/20/2013 [facility] Patient Information 2015 [place], [place]. This information is not intended to replace advice given to you by your health care provider. Make sure you discuss any questions you have with your health care provider.

## 2014-12-29 NOTE — Progress Notes (Addendum)
Subjective:  This chart was scribed for Delman Cheadle, MD by Thea Alken, ED Scribe. This patient was seen in room 10 and the patient's care was started at 2:37 PM.   Patient ID: [name], female    DOB: 06-25-1934, 80 y.o.   MRN: [id number]  HPI Chief Complaint  Patient presents with  . Joint Pain    Near left leg and groin area. x 1 week . per pt stretching helps.   HPI Comments: [name] is a 80 y.o. female who presents to the [facility] complaining of intermittent right hip pain onset 9 days. Pt denies falls or hip injury.  She reports pain with standing after sitting for a long period of time and with walking. She reports some weakness with walking up stairs but denies numbness. Pt finds relief to pain with quad stretch, flexing hip, abducting and adducting  hip and walking on her tip toes. She denies prior or recent hip injury.  She denies pain on groin and urinary symptoms. She denies decreased appetite and weight loss.  Past Medical History  Diagnosis Date  . Hyperlipidemia   . Thyroid disease   . Arthritis   . Asthma   . Depression   . Colon polyps   . Environmental allergies   . Complete heart block -intermittent     a. 11/2012 s/p SJM [facility] DR RF dc ppm.  . Osteoarthritis     hands  . Memory retention disorder 03/22/2013  . Allergy    Past Surgical History  Procedure Laterality Date  . Osborn surgery    . Salpingoophorectomy      right  . Appendectomy  1965  . Colonoscopy  2007    adenomatous polyps  . Insert / replace / remove pacemaker  12/02/2012     Dr Lovena Le  . Hand surgery    . Ovarian cystectomy  1965  . Tumor removed  1985    abdomen  . Cervical polypectomy  1996  . Permanent pacemaker insertion N/A 12/02/2012    Procedure: PERMANENT PACEMAKER INSERTION;  Surgeon: Evans Lance, MD;  Location: [facility] CATH LAB;  Service: Cardiovascular;  Laterality: N/A;  . Eye surgery     Prior to Admission medications   Medication Sig Start Date End  Date Taking? Authorizing Provider  benzonatate (TESSALON) 100 MG capsule Take 1 capsule (100 mg total) by mouth 3 (three) times daily as needed for cough. 10/02/14  Yes Leandrew Koyanagi, MD  fluticasone [facility]) 50 MCG/ACT nasal spray 1 spray each nostril bid for 1-2 months 09/14/14  Yes Leandrew Koyanagi, MD  guaiFENesin (MUCINEX) 600 MG 12 hr tablet Take 600 mg by mouth 2 (two) times daily as needed for cough.   Yes Historical Provider, MD  levothyroxine (SYNTHROID, LEVOTHROID) 50 MCG tablet 50 mcg daily. 07/17/14  Yes Historical Provider, MD  memantine (NAMENDA XR) 28 MG CP24 24 hr capsule Take 1 capsule (28 mg total) by mouth daily. 10/12/14  Yes Dennie Bible, NP  methylcellulose (ARTIFICIAL TEARS) 1 % ophthalmic solution Place 1 drop into both eyes as needed (eye irritation).   Yes Historical Provider, MD  Omega-3 Fatty Acids (FISH OIL) 1000 MG CAPS Take 1,000 mg by mouth daily.   Yes Historical Provider, MD  tolterodine (DETROL) 2 MG tablet TAKE 1 TABLET ONCE TO TWICE DAILY(EVERY 12 HOURS) FOR URINARY FREQUENCY. 12/24/14  Yes Elby Showers, MD  HYDROcodone-homatropine [facility]) 5-1.5 MG/5ML syrup 7ml at hs  for cough--may repeat in 5-6 hrs if needed Patient not taking: Reported on 12/29/2014 10/02/14   Leandrew Koyanagi, MD  Lactobacillus (ACIDOPHOLUS PO) Take 1 tablet by mouth daily.     Historical Provider, MD  Multiple Vitamins-Minerals (MULTIVITAMIN WITH MINERALS) tablet Take 1 tablet by mouth daily.      Historical Provider, MD   Review of Systems  Constitutional: Positive for activity change. Negative for fever, chills, appetite change and unexpected weight change.  Gastrointestinal: Negative for nausea, vomiting, abdominal pain, diarrhea and constipation.  Genitourinary: Positive for pelvic pain. Negative for dysuria, flank pain and difficulty urinating.  Musculoskeletal: Positive for myalgias, arthralgias and gait problem.  Skin: Negative for color change, rash and wound.    Neurological: Positive for weakness. Negative for numbness.  Hematological: Negative for adenopathy.    Objective:   Physical Exam  Constitutional: She appears well-developed and well-nourished. No distress.  HENT:  Head: Normocephalic and atraumatic.  Eyes: Conjunctivae and EOM are normal.  Neck: Neck supple.  Cardiovascular: Normal rate.   Pulmonary/Chest: Effort normal.  Abdominal: Soft. Normal appearance. She exhibits no distension and no mass. Bowel sounds are increased. There is no hepatosplenomegaly. There is no tenderness. No hernia. Hernia confirmed negative in the right inguinal area and confirmed negative in the left inguinal area.  Musculoskeletal: Normal range of motion.   No pain over the greater trochanter or fibula tibial band. Full ROM including internal and external rotation.   Lymphadenopathy:       Right: No inguinal adenopathy present.       Left: No inguinal adenopathy present.  No inguinal mass or adenopathy.  Neurological: She is alert. She has normal strength. She displays no atrophy. No sensory deficit. She exhibits normal muscle tone. Gait abnormal.  Skin: Skin is warm and dry.  Psychiatric: She has a normal mood and affect. Her behavior is normal.  Nursing note and vitals reviewed.  Filed Vitals:   12/29/14 1354  BP: 108/70  Pulse: 77  Temp: 97.9 F (36.6 C)  TempSrc: Oral  Resp: 16  Height: 5\' 8"  (1.727 m)  Weight: 140 lb (63.504 kg)  SpO2: 98%   [facility] reading (PRIMARY) by Dr. Brigitte Pulse.  Right hip: normal, mils osteoarthritis - same bilaterally, large stool burden  Dg Hip Unilat With Pelvis 2-3 Views Right  12/29/2014   CLINICAL DATA:  Acute right hip pain with no known injury. Initial encounter.  EXAM: DG HIP (WITH OR WITHOUT PELVIS) 2-3V RIGHT  COMPARISON:  CT scan of April 19, 2013.  FINDINGS: There is no evidence of hip fracture or dislocation. No significant degenerative changes noted. Focal cortical lucency is noted along the inferior portion  of the right femoral neck which appears to be enlarged compared to prior CT scan.  IMPRESSION: No fracture or dislocation is noted.  Focal cortical lucency is seen along the inferior portion of the right femoral neck which appears to be enlarged compared to prior CT scan. Further evaluation with MRI scan on nonemergent basis is recommended to rule out neoplasm.   Electronically Signed   By: Marijo Conception, M.D.   On: 12/29/2014 16:33    Assessment & Plan:   1. Hip pain, acute, right -could be due to OA vs hip flexor strain and maybe worsened by constipation - start aleve 1 tab bid for pain - stretching exercises given  2. Constipation, unspecified constipation type - start qd miralax.  3. Hip flexor tendinitis, right   4. Primary osteoarthritis of  right hip    Rt hip MRI w/ and w/o contrast ordered to r/o neoplasm as cause of enlarging lucent area visible on xray today in inferior portion of Rt femoral neck    Orders Placed This Encounter  Procedures  . DG HIP UNILAT WITH PELVIS 2-3 VIEWS RIGHT    Standing Status: Future     Number of Occurrences: 1     Standing Expiration Date: 12/29/2015    Order Specific Question:  Reason for Exam (SYMPTOM  OR DIAGNOSIS REQUIRED)    Answer:  anterior hip pain worst from sitting to standing x 10d, no known injury    Order Specific Question:  Preferred imaging location?    Answer:  External    Meds ordered this encounter  Medications  . polyethylene glycol powder (GLYCOLAX/MIRALAX) powder    Sig: Take 17 g by mouth daily.    Dispense:  255 g    Refill:  1    I personally performed the services described in this documentation, which was scribed in my presence. The recorded information has been reviewed and considered, and addended by me as needed.  Delman Cheadle, MD MPH  ADDENDUM:  CAN'T MRI PT DUE TO TYPE OF PACEMAKER SO SHE NEEDS TO HAVE THE RIGHT HIP BONE LESION EVALUATED AS CAUSE OF PAIN AND ? OF CANCER SOME OTHER METHOD - WILL REFER TO ORTHO.  WILL  TRY TO TOUCH BASE WITH RADIOLOGY TO SEE IF THEY HAVE ANY OTHER SUGGESTIONS FOR EVAL. CONSIDER ONCOLOGY REFERRAL.

## 2014-12-31 ENCOUNTER — Telehealth: Payer: Self-pay | Admitting: *Deleted

## 2015-01-01 NOTE — Addendum Note (Signed)
Addended by: Delman Cheadle on: 01/01/2015 07:15 PM   Modules accepted: Orders

## 2015-01-02 ENCOUNTER — Encounter (HOSPITAL_COMMUNITY): Payer: Self-pay

## 2015-01-02 ENCOUNTER — Emergency Department (HOSPITAL_COMMUNITY): Payer: Medicare Other

## 2015-01-02 ENCOUNTER — Inpatient Hospital Stay (HOSPITAL_COMMUNITY): Payer: Medicare Other

## 2015-01-02 ENCOUNTER — Inpatient Hospital Stay (HOSPITAL_COMMUNITY)
Admission: EM | Admit: 2015-01-02 | Discharge: 2015-01-05 | DRG: 470 | Disposition: A | Discharge status: Skilled Nursing Facility | Payer: Medicare Other | Attending: Family Medicine | Admitting: Family Medicine

## 2015-01-02 ENCOUNTER — Telehealth: Payer: Self-pay

## 2015-01-02 ENCOUNTER — Telehealth: Payer: Self-pay | Admitting: Internal Medicine

## 2015-01-02 DIAGNOSIS — Z833 Family history of diabetes mellitus: Secondary | ICD-10-CM

## 2015-01-02 DIAGNOSIS — S72011A Unspecified intracapsular fracture of right femur, initial encounter for closed fracture: Secondary | ICD-10-CM | POA: Diagnosis not present

## 2015-01-02 DIAGNOSIS — D62 Acute posthemorrhagic anemia: Secondary | ICD-10-CM | POA: Diagnosis not present

## 2015-01-02 DIAGNOSIS — Z882 Allergy status to sulfonamides status: Secondary | ICD-10-CM | POA: Diagnosis not present

## 2015-01-02 DIAGNOSIS — S72001A Fracture of unspecified part of neck of right femur, initial encounter for closed fracture: Secondary | ICD-10-CM | POA: Diagnosis not present

## 2015-01-02 DIAGNOSIS — E785 Hyperlipidemia, unspecified: Secondary | ICD-10-CM | POA: Diagnosis present

## 2015-01-02 DIAGNOSIS — Z8249 Family history of ischemic heart disease and other diseases of the circulatory system: Secondary | ICD-10-CM | POA: Diagnosis not present

## 2015-01-02 DIAGNOSIS — Z79899 Other long term (current) drug therapy: Secondary | ICD-10-CM | POA: Diagnosis not present

## 2015-01-02 DIAGNOSIS — C799 Secondary malignant neoplasm of unspecified site: Secondary | ICD-10-CM

## 2015-01-02 DIAGNOSIS — Z79891 Long term (current) use of opiate analgesic: Secondary | ICD-10-CM | POA: Diagnosis not present

## 2015-01-02 DIAGNOSIS — Z96641 Presence of right artificial hip joint: Secondary | ICD-10-CM

## 2015-01-02 DIAGNOSIS — E039 Hypothyroidism, unspecified: Secondary | ICD-10-CM | POA: Diagnosis present

## 2015-01-02 DIAGNOSIS — Z800 Family history of malignant neoplasm of digestive organs: Secondary | ICD-10-CM

## 2015-01-02 DIAGNOSIS — Z86010 Personal history of colonic polyps: Secondary | ICD-10-CM | POA: Diagnosis not present

## 2015-01-02 DIAGNOSIS — F329 Major depressive disorder, single episode, unspecified: Secondary | ICD-10-CM | POA: Diagnosis present

## 2015-01-02 DIAGNOSIS — F0390 Unspecified dementia without behavioral disturbance: Secondary | ICD-10-CM | POA: Diagnosis present

## 2015-01-02 DIAGNOSIS — M1990 Unspecified osteoarthritis, unspecified site: Secondary | ICD-10-CM | POA: Diagnosis present

## 2015-01-02 DIAGNOSIS — Z8673 Personal history of transient ischemic attack (TIA), and cerebral infarction without residual deficits: Secondary | ICD-10-CM

## 2015-01-02 DIAGNOSIS — Z23 Encounter for immunization: Secondary | ICD-10-CM

## 2015-01-02 DIAGNOSIS — J45909 Unspecified asthma, uncomplicated: Secondary | ICD-10-CM | POA: Diagnosis present

## 2015-01-02 DIAGNOSIS — X58XXXA Exposure to other specified factors, initial encounter: Secondary | ICD-10-CM | POA: Diagnosis present

## 2015-01-02 DIAGNOSIS — M25551 Pain in right hip: Secondary | ICD-10-CM | POA: Diagnosis present

## 2015-01-02 DIAGNOSIS — E876 Hypokalemia: Secondary | ICD-10-CM | POA: Diagnosis not present

## 2015-01-02 LAB — CBC WITH DIFFERENTIAL/PLATELET
Basophils Absolute: 0 10*3/uL (ref 0.0–0.1)
Basophils Relative: 0 % (ref 0–1)
EOS PCT: 0 % (ref 0–5)
Eosinophils Absolute: 0 10*3/uL (ref 0.0–0.7)
HEMATOCRIT: 38.2 % (ref 36.0–46.0)
HEMOGLOBIN: 12.9 g/dL (ref 12.0–15.0)
Lymphocytes Relative: 13 % (ref 12–46)
Lymphs Abs: 1.2 10*3/uL (ref 0.7–4.0)
MCH: 30.9 pg (ref 26.0–34.0)
MCHC: 33.8 g/dL (ref 30.0–36.0)
MCV: 91.4 fL (ref 78.0–100.0)
MONO ABS: 1 10*3/uL (ref 0.1–1.0)
Monocytes Relative: 10 % (ref 3–12)
Neutro Abs: 7 10*3/uL (ref 1.7–7.7)
Neutrophils Relative %: 77 % (ref 43–77)
Platelets: 155 10*3/uL (ref 150–400)
RBC: 4.18 MIL/uL (ref 3.87–5.11)
RDW: 12.2 % (ref 11.5–15.5)
WBC: 9.2 10*3/uL (ref 4.0–10.5)

## 2015-01-02 LAB — BASIC METABOLIC PANEL
Anion gap: 9 (ref 5–15)
BUN: 29 mg/dL — ABNORMAL HIGH (ref 6–20)
CALCIUM: 8.8 mg/dL — AB (ref 8.9–10.3)
CO2: 21 mmol/L — ABNORMAL LOW (ref 22–32)
Chloride: 106 mmol/L (ref 101–111)
Creatinine, Ser: 0.84 mg/dL (ref 0.44–1.00)
Glucose, Bld: 94 mg/dL (ref 65–99)
Potassium: 3.6 mmol/L (ref 3.5–5.1)
Sodium: 136 mmol/L (ref 135–145)

## 2015-01-02 LAB — PROTIME-INR
INR: 1.13 (ref 0.00–1.49)
PROTHROMBIN TIME: 14.7 s (ref 11.6–15.2)

## 2015-01-02 LAB — TYPE AND SCREEN
ABO/RH(D): O POS
Antibody Screen: NEGATIVE

## 2015-01-02 LAB — ABO/RH: ABO/RH(D): O POS

## 2015-01-02 MED ORDER — FLUTICASONE PROPIONATE 50 MCG/ACT NA SUSP
1.0000 | Freq: Two times a day (BID) | NASAL | Status: DC
  Administered 2015-01-02 – 2015-01-05 (×6): 1 via NASAL
  Filled 2015-01-02 (×2): qty 16

## 2015-01-02 MED ORDER — MEMANTINE HCL ER 28 MG PO CP24
28.0000 mg | ORAL_CAPSULE | Freq: Every day | ORAL | Status: DC
  Administered 2015-01-02: 28 mg via ORAL
  Filled 2015-01-02 (×2): qty 1

## 2015-01-02 MED ORDER — LEVOTHYROXINE SODIUM 75 MCG PO TABS
75.0000 ug | ORAL_TABLET | Freq: Every day | ORAL | Status: DC
  Administered 2015-01-03 – 2015-01-05 (×3): 75 ug via ORAL
  Filled 2015-01-02 (×5): qty 1

## 2015-01-02 MED ORDER — MORPHINE SULFATE 2 MG/ML IJ SOLN
0.5000 mg | INTRAMUSCULAR | Status: DC | PRN
  Administered 2015-01-03 (×2): 0.5 mg via INTRAVENOUS
  Filled 2015-01-02 (×2): qty 1

## 2015-01-02 MED ORDER — POLYVINYL ALCOHOL 1.4 % OP SOLN
1.0000 [drp] | Freq: Four times a day (QID) | OPHTHALMIC | Status: DC
  Administered 2015-01-02 – 2015-01-05 (×9): 1 [drp] via OPHTHALMIC
  Filled 2015-01-02 (×2): qty 15

## 2015-01-02 MED ORDER — GUAIFENESIN ER 600 MG PO TB12: 600.0000 mg | ORAL_TABLET | Freq: Two times a day (BID) | ORAL | Status: DC | PRN

## 2015-01-02 MED ORDER — POLYETHYLENE GLYCOL 3350 17 GM/SCOOP PO POWD
17.0000 g | Freq: Every day | ORAL | Status: DC
  Filled 2015-01-02: qty 255

## 2015-01-02 MED ORDER — HYDROCODONE-ACETAMINOPHEN 5-325 MG PO TABS
1.0000 | ORAL_TABLET | Freq: Four times a day (QID) | ORAL | Status: DC | PRN
  Administered 2015-01-03 – 2015-01-04 (×5): 1 via ORAL
  Filled 2015-01-02 (×5): qty 1

## 2015-01-02 MED ORDER — MORPHINE SULFATE 2 MG/ML IJ SOLN
2.0000 mg | Freq: Once | INTRAMUSCULAR | Status: AC
  Administered 2015-01-02: 2 mg via INTRAVENOUS
  Filled 2015-01-02: qty 1

## 2015-01-02 MED ORDER — PNEUMOCOCCAL 13-VAL CONJ VACC IM SUSP: 0.5000 mL | Freq: Once | INTRAMUSCULAR | Status: DC

## 2015-01-02 MED ORDER — POLYETHYLENE GLYCOL 3350 17 G PO PACK
17.0000 g | PACK | Freq: Every day | ORAL | Status: DC
  Administered 2015-01-04 – 2015-01-05 (×2): 17 g via ORAL

## 2015-01-02 MED ORDER — METHYLCELLULOSE 1 % OP SOLN: 1.0000 [drp] | Freq: Four times a day (QID) | OPHTHALMIC | Status: DC

## 2015-01-02 MED ORDER — TRAMADOL HCL 50 MG PO TABS: 50.0000 mg | ORAL_TABLET | Freq: Three times a day (TID) | ORAL | Status: DC | PRN

## 2015-01-02 MED ORDER — OXYBUTYNIN CHLORIDE ER 5 MG PO TB24
5.0000 mg | ORAL_TABLET | Freq: Every day | ORAL | Status: DC
  Administered 2015-01-02 – 2015-01-05 (×3): 5 mg via ORAL
  Filled 2015-01-02 (×5): qty 1

## 2015-01-02 MED ORDER — MORPHINE SULFATE 4 MG/ML IJ SOLN
4.0000 mg | Freq: Once | INTRAMUSCULAR | Status: AC
  Administered 2015-01-02: 4 mg via INTRAVENOUS
  Filled 2015-01-02: qty 1

## 2015-01-02 NOTE — Telephone Encounter (Signed)
The next step is a MRI but she can't have a MRI because of her pacemaker so that's why we referred her to an orthopedist.  I need to call the radiologist and see what other ways they might be able to assess this lesion.  We might want to refer her to a hematologist.

## 2015-01-02 NOTE — ED Provider Notes (Signed)
CSN: [id number]     Arrival date & time 01/02/15  1655 History   First MD Initiated Contact with Patient 01/02/15 1709     Chief Complaint  Patient presents with  . Hip Pain    2 weeks rt hip pain     HPI Patient is having increasing right hip pain over the past 2 weeks with significantly worsening of her pain over the past 3 days.  She is now unable to ambulate secondary to pain in her right hip.  She also reports pain with range of motion of right hip.  No fevers or chills.  She saw her outpatient doctor and had an x-ray done 3-4 days ago which demonstrated an abnormal lucency in the right femoral neck.  No prior history of cancer.  Denies recent weight loss or fatigue.  Patient does not report remember an acute injury or fall.  She had began ambulating on a neighbor's walker when her pain became worse.  Past Medical History  Diagnosis Date  . Hyperlipidemia   . Thyroid disease   . Arthritis   . Asthma   . Depression   . Colon polyps   . Environmental allergies   . Complete heart block -intermittent     a. 11/2012 s/p SJM Accent DR RF dc ppm.  . Osteoarthritis     hands  . Memory retention disorder 03/22/2013  . Allergy    Past Surgical History  Procedure Laterality Date  . Knee surgery    . Salpingoophorectomy      right  . Appendectomy  1965  . Colonoscopy  2007    adenomatous polyps  . Insert / replace / remove pacemaker  12/02/2012     Dr Lovena Le  . Hand surgery    . Ovarian cystectomy  1965  . Tumor removed  1985    abdomen  . Cervical polypectomy  1996  . Permanent pacemaker insertion N/A 12/02/2012    Procedure: PERMANENT PACEMAKER INSERTION;  Surgeon: Evans Lance, MD;  Location: [facility] CATH LAB;  Service: Cardiovascular;  Laterality: N/A;  . Eye surgery     Family History  Problem Relation Age of Onset  . Colon cancer Father   . Colon polyps Brother   . Diabetes Brother     maternal aunt  . Coronary artery disease Brother    History  Substance Use Topics   . Smoking status: Never Smoker   . Smokeless tobacco: Never Used  . Alcohol Use: 0.0 oz/week    0 Standard drinks or equivalent per week     Comment: occas. which is seldom   OB History    No data available     Review of Systems  All other systems reviewed and are negative.     Allergies  Sulfa antibiotics and Sulfasalazine  Home Medications   Prior to Admission medications   Medication Sig Start Date End Date Taking? Authorizing Provider  fluticasone (FLONASE) 50 MCG/ACT nasal spray Place 1 spray into both nostrils 2 (two) times daily.  10/23/14  Yes Historical Provider, MD  guaiFENesin (MUCINEX) 600 MG 12 hr tablet Take 600 mg by mouth 2 (two) times daily as needed for cough or to loosen phlegm.   Yes Historical Provider, MD  levothyroxine (SYNTHROID, LEVOTHROID) 75 MCG tablet Take 75 mcg by mouth daily before breakfast.  12/13/14  Yes Historical Provider, MD  memantine (NAMENDA XR) 28 MG CP24 24 hr capsule Take 1 capsule (28 mg total) by mouth daily. 10/12/14  Yes Dennie Bible, NP  methylcellulose (ARTIFICIAL TEARS) 1 % ophthalmic solution Place 1 drop into both eyes 4 (four) times daily.    Yes Historical Provider, MD  polyethylene glycol powder (GLYCOLAX/MIRALAX) powder Take 17 g by mouth daily. 12/29/14  Yes Shawnee Knapp, MD  tolterodine (DETROL) 2 MG tablet TAKE 1 TABLET ONCE TO TWICE DAILY(EVERY 12 HOURS) FOR URINARY FREQUENCY. 12/24/14  Yes Elby Showers, MD  traMADol (ULTRAM) 50 MG tablet Take 1 tablet (50 mg total) by mouth every 8 (eight) hours as needed. 01/02/15  Yes Elby Showers, MD   BP 125/55 mmHg  Pulse 61  Temp(Src) 98.5 F (36.9 C) (Oral)  Resp 20  SpO2 89% Physical Exam  Constitutional: She is oriented to person, place, and time. She appears well-developed and well-nourished. No distress.  HENT:  Head: Normocephalic and atraumatic.  Eyes: EOM are normal.  Neck: Normal range of motion.  Cardiovascular: Normal rate, regular rhythm and normal heart  sounds.   Pulmonary/Chest: Effort normal and breath sounds normal.  Abdominal: Soft. She exhibits no distension. There is no tenderness.  Musculoskeletal:  Limited range of motion the right hip secondary to pain.  No obvious shortening or rotation of the right lower extremity as compared to left.  No unilateral leg swelling of the right.  Normal pulses in right foot.  Wiggles her toes.  Normal sensation in her foot.  No L-spine tenderness.  Neurological: She is alert and oriented to person, place, and time.  Skin: Skin is warm and dry.  Psychiatric: She has a normal mood and affect. Judgment normal.  Nursing note and vitals reviewed.   ED Course  Procedures (including critical care time) Labs Review Labs Reviewed  BASIC METABOLIC PANEL - Abnormal; Notable for the following:    CO2 21 (*)    BUN 29 (*)    Calcium 8.8 (*)    All other components within normal limits  CBC WITH DIFFERENTIAL/PLATELET  PROTIME-INR  TYPE AND SCREEN    Imaging Review Dg Chest 2 View  01/02/2015   CLINICAL DATA:  Right hip pain for 2 weeks  EXAM: CHEST  2 VIEW  COMPARISON:  Radiograph 10/02/2014  FINDINGS: Left-sided pacemaker overlies normal cardiac silhouette. No effusion, infiltrate, pneumothorax. Lungs are hyperinflated. Chronic bronchitic markings.  IMPRESSION: 1. No acute cardiopulmonary findings 2. Hyperinflated lungs with chronic bronchitic markings.   Electronically Signed   By: Suzy Bouchard M.D.   On: 01/02/2015 18:01   Dg Hip Unilat With Pelvis 2-3 Views Right  01/02/2015   CLINICAL DATA:  Right hip pain for 2 weeks.  EXAM: DG HIP (WITH OR WITHOUT PELVIS) 2-3V RIGHT  COMPARISON:  None.  FINDINGS: Subcapital fracture of the right femoral neck with minimal angulation. No dislocation.  IMPRESSION: Acute  subcapital right femoral neck fracture   Electronically Signed   By: Suzy Bouchard M.D.   On: 01/02/2015 17:59  I personally reviewed the imaging tests through PACS system I reviewed available  ER/hospitalization records through the EMR   ECG interpretation  Date: 01/02/2015  Rate: 62  Rhythm: av dual paced  Old EKG Reviewed: No significant changes noted     MDM   Final diagnoses:  Closed right hip fracture, initial encounter    Patient with new acute right subcapital hip fracture.  This is concerning however for pathologic fracture given the abnormal lucency on her x-ray 4 days ago and her increasing pain in her right hip over the past 2 weeks.  She will need a cancer workup.  She will undergo CT imaging of her right hip at this time to help from an operative standpoint.  Orthopedics to see the patient.  Hospitalist admission.  Ortho: Dr Rhona Raider [facility]    Jola Schmidt, MD 01/02/15 1946

## 2015-01-02 NOTE — H&P (Signed)
[facility] History and Physical  [name]:[id number] DOB: 1934/08/06 DOA: 01/02/2015  Referring physician: Jola Schmidt, MD PCP: Elby Showers, MD   Chief Complaint: My hip is broken.  HPI: [name] is a 80 y.o. female with bellow PMH who was brought to the ED due to pain for about two weeks which has worsen significantly the last several days. She saw her PCP a few days ago, had an X-ray of the area which show a radiolucency. A More recent film shows a subcapital fracture of the right femoral neck and she was referred to the service for admission. When seen the patient was unable to provide much history due to memory problems, pain and previous sedation from medication. She was in NAD.   Review of Systems:  Unable to fully obtain due to memory retention disorder.  Past Medical History  Diagnosis Date  . Hyperlipidemia   . Thyroid disease   . Arthritis   . Asthma   . Depression   . Colon polyps   . Environmental allergies   . Complete heart block -intermittent     a. 11/2012 s/p SJM Accent DR RF dc ppm.  . Osteoarthritis     hands  . Memory retention disorder 03/22/2013  . Allergy    Past Surgical History  Procedure Laterality Date  . Knee surgery    . Salpingoophorectomy      right  . Appendectomy  1965  . Colonoscopy  2007    adenomatous polyps  . Insert / replace / remove pacemaker  12/02/2012     Dr Lovena Le  . Hand surgery    . Ovarian cystectomy  1965  . Tumor removed  1985    abdomen  . Cervical polypectomy  1996  . Permanent pacemaker insertion N/A 12/02/2012    Procedure: PERMANENT PACEMAKER INSERTION;  Surgeon: Evans Lance, MD;  Location: [facility] CATH LAB;  Service: Cardiovascular;  Laterality: N/A;  . Eye surgery     Social History:  reports that she has never smoked. She has never used smokeless tobacco. She reports that she drinks alcohol. She reports that she does not use illicit drugs.  Allergies  Allergen Reactions  . Sulfa Antibiotics  Diarrhea and Rash  . Sulfasalazine Diarrhea and Rash    Family History  Problem Relation Age of Onset  . Colon cancer Father   . Colon polyps Brother   . Diabetes Brother     maternal aunt  . Coronary artery disease Brother      Prior to Admission medications   Medication Sig Start Date End Date Taking? Authorizing Provider  fluticasone (FLONASE) 50 MCG/ACT nasal spray Place 1 spray into both nostrils 2 (two) times daily.  10/23/14  Yes Historical Provider, MD  guaiFENesin (MUCINEX) 600 MG 12 hr tablet Take 600 mg by mouth 2 (two) times daily as needed for cough or to loosen phlegm.   Yes Historical Provider, MD  levothyroxine (SYNTHROID, LEVOTHROID) 75 MCG tablet Take 75 mcg by mouth daily before breakfast.  12/13/14  Yes Historical Provider, MD  memantine (NAMENDA XR) 28 MG CP24 24 hr capsule Take 1 capsule (28 mg total) by mouth daily. 10/12/14  Yes Dennie Bible, NP  methylcellulose (ARTIFICIAL TEARS) 1 % ophthalmic solution Place 1 drop into both eyes 4 (four) times daily.    Yes Historical Provider, MD  polyethylene glycol powder (GLYCOLAX/MIRALAX) powder Take 17 g by mouth daily. 12/29/14  Yes Shawnee Knapp, MD  tolterodine (  DETROL) 2 MG tablet TAKE 1 TABLET ONCE TO TWICE DAILY(EVERY 12 HOURS) FOR URINARY FREQUENCY. 12/24/14  Yes Elby Showers, MD  traMADol (ULTRAM) 50 MG tablet Take 1 tablet (50 mg total) by mouth every 8 (eight) hours as needed. 01/02/15  Yes Elby Showers, MD   Physical Exam: Filed Vitals:   01/02/15 1830 01/02/15 1900 01/02/15 1930 01/02/15 2000  BP: 125/55 126/56 122/62 124/61  Pulse: 61 57 56 65  Temp:      TempSrc:      Resp:  19 13 15   SpO2: 89% 92% 90% 93%    Wt Readings from Last 3 Encounters:  12/29/14 63.504 kg (140 lb)  11/29/14 62.959 kg (138 lb 12.8 oz)  10/02/14 60.328 kg (133 lb)    General:  Appears calm and comfortable Eyes: PERRL, normal lids, irises & conjunctiva ENT: grossly normal hearing, lips & tongue Neck: no LAD, masses or  thyromegaly Cardiovascular: RRR, no m/r/g. No LE edema. Telemetry: SR, no arrhythmias  Respiratory: CTA bilaterally, no w/r/r. Normal respiratory effort. Abdomen: soft, ntnd Skin: no rash or induration seen on limited exam Musculoskeletal: grossly normal tone BUE/BLE, pain on right hip area, pulses are palpable distally.  Psychiatric: grossly normal mood and affect, speech fluent and appropriate, disoriented.  Neurologic: grossly non-focal.          Labs on Admission:  Basic Metabolic Panel:  Recent Labs Lab 01/02/15 1819  NA 136  K 3.6  CL 106  CO2 21  GLUCOSE 94  BUN 29  CREATININE 0.84  CALCIUM 8.8   Liver Function Tests: No results for input(s): AST, ALT, ALKPHOS, BILITOT, PROT, ALBUMIN in the last 168 hours. No results for input(s): LIPASE, AMYLASE in the last 168 hours. No results for input(s): AMMONIA in the last 168 hours. CBC:  Recent Labs Lab 01/02/15 1819  WBC 9.2  NEUTROABS 7.0  HGB 12.9  HCT 38.2  MCV 91.4  PLT 155   Cardiac Enzymes: No results for input(s): CKTOTAL, CKMB, CKMBINDEX, TROPONINI in the last 168 hours.  BNP (last 3 results) No results for input(s): BNP in the last 8760 hours.  ProBNP (last 3 results) No results for input(s): PROBNP in the last 8760 hours.  CBG: No results for input(s): GLUCAP in the last 168 hours.  Radiological Exams on Admission: Dg Chest 2 View  01/02/2015   CLINICAL DATA:  Right hip pain for 2 weeks  EXAM: CHEST  2 VIEW  COMPARISON:  Radiograph 10/02/2014  FINDINGS: Left-sided pacemaker overlies normal cardiac silhouette. No effusion, infiltrate, pneumothorax. Lungs are hyperinflated. Chronic bronchitic markings.  IMPRESSION: 1. No acute cardiopulmonary findings 2. Hyperinflated lungs with chronic bronchitic markings.   Electronically Signed   By: Suzy Bouchard M.D.   On: 01/02/2015 18:01   Dg Hip Unilat With Pelvis 2-3 Views Right  01/02/2015   CLINICAL DATA:  Right hip pain for 2 weeks.  EXAM: DG HIP  (WITH OR WITHOUT PELVIS) 2-3V RIGHT  COMPARISON:  None.  FINDINGS: Subcapital fracture of the right femoral neck with minimal angulation. No dislocation.  IMPRESSION: Acute  subcapital right femoral neck fracture   Electronically Signed   By: Suzy Bouchard M.D.   On: 01/02/2015 17:59    EKG: Independently reviewed.  Assessment/Plan Active Problems:   Closed right hip fracture  Hyperlipidemia  Thyroid disease  Asthma   Depression   Orthopedic surgery service was already consulted by the emergency department. They will be evaluating the patient this evening. I will  order analgesia as needed. And we'll keep the patient nothing by mouth in case he requires a surgical procedure tomorrow. We'll continue also her regular medications for her hypothyroidism hyperlipidemia. I will do GI and DVT prophylaxis,     Code Status: Full DVT Prophylaxis: Lovenox Family CommunicationDoloros, [place] [phone]    Disposition Plan: Home  Time spent: 60 minutes  Reubin Milan [facility] Pager [phone]

## 2015-01-02 NOTE — Telephone Encounter (Signed)
PATIENT'S HUSBAND (Melissa) [place] DR. SHAW ON Saturday FOR (R) HIP PAIN. SHE DID X-RAYS AND SAID THEY WOULD BE SENT OFF TO LET A RADIOLOGIST LOOK AT THEM. SHE CALLED THEM BACK TO SAY THE RADIOLOGIST SAW A SPOT THAT NEEDS TO BE LOOKED AT FURTHER. SHE SUGGESTED AN MRI, HOWEVER, SHE HAS A PACE MAKER. MR. Melissa WAS TOLD THAT DR. SHAW WOULD CALL THEM BACK BY THE END OF DAY ON TUES. TO LET THEM KNOW WHAT ADDITIONAL TEST THEY HAVE DECIDED FOR HER TO HAVE, BUT HE HAS NOT HEARD ANYTHING BACK FROM [place]. HE SAID HIS WIFE IS VERY UNCOMFORTABLE AND HE WOULD LIKE TO KNOW WHAT THEY NEED TO DO NEXT AS SOON AS POSSIBLE. BEST PHONE [phone] (HOME) HUSBAND'S NAME IS [name].  [name]

## 2015-01-02 NOTE — Telephone Encounter (Signed)
New message     Pt went to urgent care on Saturday and they said pt has hip flexor and also found spot on hip bone. Pt is needing to have an MRI.  Please call to discuss options for pt having a MRI or CT scan.

## 2015-01-02 NOTE — Consult Note (Signed)
Melissa Nakayama, MD  Chauncey Cruel, PA-C  Loni Dolly, PA-C                                  [facility]                [address] [place], [place]  [address]   [place]            MRN:  [id number] DOB/SEX:  11/05/1934/female     CHIEF COMPLAINT:  Painful right hip  HISTORY: [name] a 80 y.o. female with some memory issues.  Has had some hip pain for several weeks and actually had xray a few days ago which revealed lytic area in femoral neck.  Subsequently has had severe increase in discomfort and can no longer walk.  Came to ED and xray shows displaced femoral neck fracture.  ORS consulted for management.  Denies pain elsewhere.   PAST MEDICAL HISTORY: Patient Active Problem List   Diagnosis Date Noted  . Closed right hip fracture 01/02/2015  . Weight loss, unintentional 02/19/2014  . Dementia arising in the senium and presenium 01/30/2014  . 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 Memory loss 07/07/2013  . Memory retention disorder 03/22/2013  . Pacemaker 03/09/2013  . Complete heart block -intermittent   . Syncope 11/18/2012  . Personal history of adenomatous colonic polyps 11/12/2011  . History of TIA (transient ischemic attack) 09/19/2011  . Hypothyroidism 01/13/2011  . Hyperlipidemia 01/13/2011  . First degree AV block 01/13/2011  . Gait disturbance 01/13/2011   Past Medical History  Diagnosis Date  . Hyperlipidemia   . Thyroid disease   . Arthritis   . Asthma   . Depression   . Colon polyps   . Environmental allergies   . Complete heart block -intermittent     a. 11/2012 s/p SJM Accent DR RF dc ppm.  . Osteoarthritis     hands  . Memory retention disorder 03/22/2013  . Allergy    Past Surgical History  Procedure Laterality Date  . Knee surgery    . Salpingoophorectomy      right  . Appendectomy  1965  . Colonoscopy  2007    adenomatous polyps  .  Insert / replace / remove pacemaker  12/02/2012     Dr Lovena Le  . Hand surgery    . Ovarian cystectomy  1965  . Tumor removed  1985    abdomen  . Cervical polypectomy  1996  . Permanent pacemaker insertion N/A 12/02/2012    Procedure: PERMANENT PACEMAKER INSERTION;  Surgeon: Evans Lance, MD;  Location: [facility] CATH LAB;  Service: Cardiovascular;  Laterality: N/A;  . Eye surgery       MEDICATIONS:   Current facility-administered medications:  .  fluticasone (FLONASE) 50 MCG/ACT nasal spray 1 spray, 1 spray, Each Nare, BID, Reubin Milan, MD .  guaiFENesin [facility]) 12 hr tablet 600 mg, 600 mg, Oral, BID PRN, Reubin Milan, MD .  HYDROcodone-acetaminophen (NORCO/VICODIN) 5-325 MG per tablet 1 tablet, 1 tablet, Oral, Q6H PRN, Reubin Milan, MD .  Derrill Memo ON 01/03/2015] levothyroxine (SYNTHROID, LEVOTHROID) tablet 75 mcg, 75 mcg, Oral, QAC breakfast, Reubin Milan, MD .  memantine (NAMENDA XR) 24 hr capsule 28 mg, 28 mg, Oral, Daily, Reubin Milan, MD .  morphine 2 MG/ML injection 0.5 mg, 0.5 mg, Intravenous, Q2H  PRN, Reubin Milan, MD .  oxybutynin (DITROPAN-XL) 24 hr tablet 5 mg, 5 mg, Oral, QHS, Reubin Milan, MD .  Derrill Memo ON 01/03/2015] polyethylene glycol (MIRALAX / GLYCOLAX) packet 17 g, 17 g, Oral, Daily, Reubin Milan, MD .  polyvinyl alcohol (LIQUIFILM TEARS) 1.4 % ophthalmic solution 1 drop, 1 drop, Both Eyes, QID, Reubin Milan, MD  ALLERGIES:   Allergies  Allergen Reactions  . Sulfa Antibiotics Diarrhea and Rash  . Sulfasalazine Diarrhea and Rash    REVIEW OF SYSTEMS: REVIEWED IN DETAIL IN CHART  FAMILY HISTORY:   Family History  Problem Relation Age of Onset  . Colon cancer Father   . Colon polyps Brother   . Diabetes Brother     maternal aunt  . Coronary artery disease Brother     SOCIAL HISTORY:   History  Substance Use Topics  . Smoking status: Never Smoker   . Smokeless tobacco: Never Used  . Alcohol Use: 0.0 oz/week     0 Standard drinks or equivalent per week     Comment: occas. which is seldom     EXAMINATION: Vital signs in last 24 hours: Temp:  [98.5 F (36.9 C)] 98.5 F (36.9 C) (07/13 1718) Pulse Rate:  [56-65] 65 (07/13 2000) Resp:  [13-20] 15 (07/13 2000) BP: (122-126)/(55-64) 124/61 mmHg (07/13 2000) SpO2:  [89 %-95 %] 93 % (07/13 2000)  BP 124/61 mmHg  Pulse 65  Temp(Src) 98.5 F (36.9 C) (Oral)  Resp 15  SpO2 93%  General Appearance:    Alert, cooperative, no distress, appears stated age  Head:    Normocephalic, without obvious abnormality, atraumatic  Eyes:    PERRL, conjunctiva/corneas clear, EOM's intact, fundi    benign, both eyes  Ears:    Normal TM's and external ear canals, both ears  Nose:   Nares normal, septum midline, mucosa normal, no drainage    or sinus tenderness  Throat:   Lips, mucosa, and tongue normal; teeth and gums normal  Neck:   Supple, symmetrical, trachea midline, no adenopathy;    thyroid:  no enlargement/tenderness/nodules; no carotid   bruit or JVD  Back:     Symmetric, no curvature, ROM normal, no CVA tenderness  Lungs:     Clear to auscultation bilaterally, respirations unlabored  Chest Wall:    No tenderness or deformity   Heart:    Regular rate and rhythm, S1 and S2 normal, no murmur, rub   or gallop  Breast Exam:     Abdomen:     Soft, non-tender, bowel sounds active all four quadrants,    no masses, no organomegaly  Genitalia:     Rectal:     Extremities:   Extremities normal, atraumatic, no cyanosis or edema  Pulses:   2+ and symmetric all extremities  Skin:   Skin color, texture, turgor normal, no rashes or lesions  Lymph nodes:   Cervical, supraclavicular, and axillary nodes normal  Neurologic:   CNII-XII intact, normal strength, sensation and reflexes    throughout    Musculoskeletal Exam:   Right leg shortened and externally rotated.  Painful to motion. Other 3 ext painless   DIAGNOSTIC STUDIES: Recent laboratory  studies:  Recent Labs  01/02/15 1819  WBC 9.2  HGB 12.9  HCT 38.2  PLT 155    Recent Labs  01/02/15 1819  NA 136  K 3.6  CL 106  CO2 21*  BUN 29*  CREATININE 0.84  GLUCOSE 94  CALCIUM 8.8*  Lab Results  Component Value Date   INR 1.13 01/02/2015   INR 0.95 07/17/2009     Recent Radiographic Studies :  Dg Chest 2 View  01/02/2015   CLINICAL DATA:  Right hip pain for 2 weeks  EXAM: CHEST  2 VIEW  COMPARISON:  Radiograph 10/02/2014  FINDINGS: Left-sided pacemaker overlies normal cardiac silhouette. No effusion, infiltrate, pneumothorax. Lungs are hyperinflated. Chronic bronchitic markings.  IMPRESSION: 1. No acute cardiopulmonary findings 2. Hyperinflated lungs with chronic bronchitic markings.   Electronically Signed   By: Suzy Bouchard M.D.   On: 01/02/2015 18:01   Dg Hip Unilat With Pelvis 2-3 Views Right  01/02/2015   CLINICAL DATA:  Right hip pain for 2 weeks.  EXAM: DG HIP (WITH OR WITHOUT PELVIS) 2-3V RIGHT  COMPARISON:  None.  FINDINGS: Subcapital fracture of the right femoral neck with minimal angulation. No dislocation.  IMPRESSION: Acute  subcapital right femoral neck fracture   Electronically Signed   By: Suzy Bouchard M.D.   On: 01/02/2015 17:59   Dg Hip Unilat With Pelvis 2-3 Views Right  12/29/2014   CLINICAL DATA:  Acute right hip pain with no known injury. Initial encounter.  EXAM: DG HIP (WITH OR WITHOUT PELVIS) 2-3V RIGHT  COMPARISON:  CT scan of April 19, 2013.  FINDINGS: There is no evidence of hip fracture or dislocation. No significant degenerative changes noted. Focal cortical lucency is noted along the inferior portion of the right femoral neck which appears to be enlarged compared to prior CT scan.  IMPRESSION: No fracture or dislocation is noted.  Focal cortical lucency is seen along the inferior portion of the right femoral neck which appears to be enlarged compared to prior CT scan. Further evaluation with MRI scan on nonemergent basis is  recommended to rule out neoplasm.   Electronically Signed   By: Marijo Conception, M.D.   On: 12/29/2014 16:33    ASSESSMENT:  Right hip fracture, likely pathologic   PLAN:  Regardless of etiology of this fracture still needs stabilization to allow to sit and potentially stand and walk.  Will order full length plain xray of femur and CT would be nice to make sure no problem with fixation down shaft of femur bone.  Will plan on surgery tomorrow afternoon assuming all approved by medical team. Will try for spinal anesthetic and will plan on sending femoral head to pathology.  Discussed all this over phone with her husband of 50+ years [phone]) and he is in agreement.  Son works as Marine scientist on an Film/video editor elsewhere.  Kentrail Shew G 01/02/2015, 9:45 PM

## 2015-01-02 NOTE — ED Notes (Signed)
Bed: WA04 Expected date:  Expected time:  Means of arrival:  Comments: ems 

## 2015-01-02 NOTE — Telephone Encounter (Signed)
Patient scheduled to be seen at [facility] Friday July 15th at 3:30. Patient husband aware of appt time.

## 2015-01-02 NOTE — ED Notes (Signed)
Per [facility] Pt denies injury to rt hip. Hip pain x 2 weeks. Seen by several MD for presenting complaints without DX. ? "spot" on rt hip from some MD thant concerned them it was Cancer. Here for evaluation. 0/10 without movement. denies numbness and tingling

## 2015-01-02 NOTE — Telephone Encounter (Signed)
Patient husband called states patient is in pain and unable to walk due to pain. Husband is requesting pain medication for patient. Per Dr Renold Genta we will call in Tramadol 50mg . Patient is to be referred to [place] for further treatment.

## 2015-01-02 NOTE — Telephone Encounter (Signed)
Can referrals check on status of appt for MRI. This is the next step. It also looks like she is being set up with and orthopedist.

## 2015-01-03 ENCOUNTER — Inpatient Hospital Stay (HOSPITAL_COMMUNITY): Payer: Medicare Other | Admitting: Anesthesiology

## 2015-01-03 ENCOUNTER — Inpatient Hospital Stay (HOSPITAL_COMMUNITY): Payer: Medicare Other

## 2015-01-03 ENCOUNTER — Encounter (HOSPITAL_COMMUNITY)
Admission: EM | Disposition: A | Discharge status: Skilled Nursing Facility | Payer: Self-pay | Source: Home / Self Care | Attending: Family Medicine

## 2015-01-03 DIAGNOSIS — E785 Hyperlipidemia, unspecified: Secondary | ICD-10-CM

## 2015-01-03 DIAGNOSIS — E039 Hypothyroidism, unspecified: Secondary | ICD-10-CM

## 2015-01-03 DIAGNOSIS — S72001A Fracture of unspecified part of neck of right femur, initial encounter for closed fracture: Secondary | ICD-10-CM

## 2015-01-03 HISTORY — PX: HIP ARTHROPLASTY: SHX981

## 2015-01-03 LAB — SURGICAL PCR SCREEN
MRSA, PCR: NEGATIVE
STAPHYLOCOCCUS AUREUS: NEGATIVE

## 2015-01-03 SURGERY — HEMIARTHROPLASTY, HIP, DIRECT ANTERIOR APPROACH, FOR FRACTURE
Anesthesia: Spinal | Site: Hip | Laterality: Right

## 2015-01-03 MED ORDER — PROPOFOL INFUSION 10 MG/ML OPTIME
INTRAVENOUS | Status: DC | PRN
  Administered 2015-01-03: 50 ug/kg/min via INTRAVENOUS

## 2015-01-03 MED ORDER — METOCLOPRAMIDE HCL 10 MG PO TABS
5.0000 mg | ORAL_TABLET | Freq: Three times a day (TID) | ORAL | Status: DC | PRN
Start: 2015-01-03 — End: 2015-01-05

## 2015-01-03 MED ORDER — MEPERIDINE HCL 50 MG/ML IJ SOLN: 6.2500 mg | INTRAMUSCULAR | Status: DC | PRN

## 2015-01-03 MED ORDER — CEFAZOLIN SODIUM-DEXTROSE 2-3 GM-% IV SOLR
INTRAVENOUS | Status: AC
  Filled 2015-01-03: qty 50

## 2015-01-03 MED ORDER — ONDANSETRON HCL 4 MG PO TABS: 4.0000 mg | ORAL_TABLET | Freq: Four times a day (QID) | ORAL | Status: DC | PRN

## 2015-01-03 MED ORDER — PHENOL 1.4 % MT LIQD: 1.0000 | OROMUCOSAL | Status: DC | PRN

## 2015-01-03 MED ORDER — 0.9 % SODIUM CHLORIDE (POUR BTL) OPTIME
TOPICAL | Status: DC | PRN
  Administered 2015-01-03: 1000 mL

## 2015-01-03 MED ORDER — SODIUM CHLORIDE 0.9 % IR SOLN
Status: DC | PRN
  Administered 2015-01-03: 3000 mL

## 2015-01-03 MED ORDER — PROPOFOL 10 MG/ML IV BOLUS
INTRAVENOUS | Status: AC
  Filled 2015-01-03: qty 20

## 2015-01-03 MED ORDER — ACETAMINOPHEN 325 MG PO TABS: 650.0000 mg | ORAL_TABLET | Freq: Four times a day (QID) | ORAL | Status: DC | PRN

## 2015-01-03 MED ORDER — MEMANTINE HCL ER 28 MG PO CP24
28.0000 mg | ORAL_CAPSULE | Freq: Every day | ORAL | Status: DC
  Administered 2015-01-03 – 2015-01-05 (×2): 28 mg via ORAL
  Filled 2015-01-03 (×4): qty 1

## 2015-01-03 MED ORDER — METOCLOPRAMIDE HCL 5 MG/ML IJ SOLN: 5.0000 mg | Freq: Three times a day (TID) | INTRAMUSCULAR | Status: DC | PRN

## 2015-01-03 MED ORDER — BUPIVACAINE HCL (PF) 0.5 % IJ SOLN
INTRAMUSCULAR | Status: AC
  Filled 2015-01-03: qty 30

## 2015-01-03 MED ORDER — BISACODYL 5 MG PO TBEC: 5.0000 mg | DELAYED_RELEASE_TABLET | Freq: Every day | ORAL | Status: DC | PRN

## 2015-01-03 MED ORDER — EPHEDRINE SULFATE 50 MG/ML IJ SOLN
INTRAMUSCULAR | Status: AC
  Filled 2015-01-03: qty 1

## 2015-01-03 MED ORDER — LACTATED RINGERS IV SOLN
INTRAVENOUS | Status: DC
  Administered 2015-01-03: 16:00:00 via INTRAVENOUS
  Administered 2015-01-03: 1000 mL via INTRAVENOUS

## 2015-01-03 MED ORDER — ASPIRIN EC 325 MG PO TBEC
325.0000 mg | DELAYED_RELEASE_TABLET | Freq: Two times a day (BID) | ORAL | Status: DC
  Administered 2015-01-04 – 2015-01-05 (×3): 325 mg via ORAL
  Filled 2015-01-03 (×5): qty 1

## 2015-01-03 MED ORDER — CEFAZOLIN SODIUM-DEXTROSE 2-3 GM-% IV SOLR
2.0000 g | Freq: Once | INTRAVENOUS | Status: AC
  Administered 2015-01-03: 2 g via INTRAVENOUS

## 2015-01-03 MED ORDER — MENTHOL 3 MG MT LOZG: 1.0000 | LOZENGE | OROMUCOSAL | Status: DC | PRN

## 2015-01-03 MED ORDER — FENTANYL CITRATE (PF) 100 MCG/2ML IJ SOLN: 25.0000 ug | INTRAMUSCULAR | Status: DC | PRN

## 2015-01-03 MED ORDER — PROMETHAZINE HCL 25 MG/ML IJ SOLN: 6.2500 mg | INTRAMUSCULAR | Status: DC | PRN

## 2015-01-03 MED ORDER — DEXTROSE 5 % IV SOLN
500.0000 mg | Freq: Four times a day (QID) | INTRAVENOUS | Status: DC | PRN
  Filled 2015-01-03: qty 5

## 2015-01-03 MED ORDER — ACETAMINOPHEN 650 MG RE SUPP: 650.0000 mg | Freq: Four times a day (QID) | RECTAL | Status: DC | PRN

## 2015-01-03 MED ORDER — PROPOFOL 10 MG/ML IV BOLUS
INTRAVENOUS | Status: DC | PRN
  Administered 2015-01-03: 10 mg via INTRAVENOUS
  Administered 2015-01-03 (×2): 20 mg via INTRAVENOUS

## 2015-01-03 MED ORDER — ONDANSETRON HCL 4 MG/2ML IJ SOLN: 4.0000 mg | Freq: Four times a day (QID) | INTRAMUSCULAR | Status: DC | PRN

## 2015-01-03 MED ORDER — DOCUSATE SODIUM 100 MG PO CAPS
100.0000 mg | ORAL_CAPSULE | Freq: Two times a day (BID) | ORAL | Status: DC
  Administered 2015-01-03 – 2015-01-05 (×4): 100 mg via ORAL

## 2015-01-03 MED ORDER — CEFAZOLIN SODIUM-DEXTROSE 2-3 GM-% IV SOLR
2.0000 g | Freq: Four times a day (QID) | INTRAVENOUS | Status: AC
  Administered 2015-01-03 – 2015-01-04 (×2): 2 g via INTRAVENOUS
  Filled 2015-01-03 (×2): qty 50

## 2015-01-03 MED ORDER — METHOCARBAMOL 500 MG PO TABS
500.0000 mg | ORAL_TABLET | Freq: Four times a day (QID) | ORAL | Status: DC | PRN
  Administered 2015-01-03 – 2015-01-05 (×3): 500 mg via ORAL
  Filled 2015-01-03 (×3): qty 1

## 2015-01-03 SURGICAL SUPPLY — 39 items
BAG ZIPLOCK 12X15 (MISCELLANEOUS) ×2 IMPLANT
BLADE SAW SAG 73X25 THK (BLADE) ×1
BLADE SAW SGTL 73X25 THK (BLADE) ×1 IMPLANT
BRUSH FEMORAL CANAL (MISCELLANEOUS) ×2 IMPLANT
CAPT HIP HEMI 1 ×2 IMPLANT
CEMENT BONE DEPUY (Cement) ×4 IMPLANT
CEMENT RESTRICTOR DEPUY SZ 3 (Cement) ×2 IMPLANT
DRAPE INCISE IOBAN 66X45 STRL (DRAPES) ×2 IMPLANT
DRAPE ORTHO SPLIT 77X108 STRL (DRAPES) ×2
DRAPE POUCH INSTRU U-SHP 10X18 (DRAPES) ×2 IMPLANT
DRAPE SURG ORHT 6 SPLT 77X108 (DRAPES) ×2 IMPLANT
DRAPE U-SHAPE 47X51 STRL (DRAPES) ×2 IMPLANT
DRSG AQUACEL AG ADV 3.5X10 (GAUZE/BANDAGES/DRESSINGS) ×2 IMPLANT
ELECT REM PT RETURN 9FT ADLT (ELECTROSURGICAL) ×2
ELECTRODE REM PT RTRN 9FT ADLT (ELECTROSURGICAL) ×1 IMPLANT
FACESHIELD WRAPAROUND (MASK) ×8 IMPLANT
GAUZE SPONGE 4X4 12PLY STRL (GAUZE/BANDAGES/DRESSINGS) IMPLANT
GLOVE BIO SURGEON STRL SZ8.5 (GLOVE) IMPLANT
HANDPIECE INTERPULSE COAX TIP (DISPOSABLE) ×1
HOOD PEEL AWAY FACE SHEILD DIS (HOOD) IMPLANT
IMMOBILIZER KNEE 20 (SOFTGOODS) ×2
IMMOBILIZER KNEE 20 THIGH 36 (SOFTGOODS) ×1 IMPLANT
KIT BASIN OR (CUSTOM PROCEDURE TRAY) ×2 IMPLANT
NEEDLE MA TROC 1/2 (NEEDLE) IMPLANT
PACK TOTAL JOINT (CUSTOM PROCEDURE TRAY) ×2 IMPLANT
PASSER SUT SWANSON 36MM LOOP (INSTRUMENTS) IMPLANT
POSITIONER SURGICAL ARM (MISCELLANEOUS) ×2 IMPLANT
PRESSURIZER FEMORAL UNIV (MISCELLANEOUS) ×2 IMPLANT
SET HNDPC FAN SPRY TIP SCT (DISPOSABLE) ×1 IMPLANT
STRIP CLOSURE SKIN 1/2X4 (GAUZE/BANDAGES/DRESSINGS) IMPLANT
SUT ETHIBOND NAB CT1 #1 30IN (SUTURE) ×6 IMPLANT
SUT MNCRL AB 3-0 PS2 18 (SUTURE) ×2 IMPLANT
SUT VIC AB 1 CT1 27 (SUTURE) ×1
SUT VIC AB 1 CT1 27XBRD ANTBC (SUTURE) ×1 IMPLANT
SUT VIC AB 2-0 CT1 27 (SUTURE) ×3
SUT VIC AB 2-0 CT1 TAPERPNT 27 (SUTURE) ×3 IMPLANT
TOWEL OR 17X26 10 PK STRL BLUE (TOWEL DISPOSABLE) ×2 IMPLANT
TOWER CARTRIDGE SMART MIX (DISPOSABLE) ×2 IMPLANT
TRAY FOLEY W/METER SILVER 14FR (SET/KITS/TRAYS/PACK) IMPLANT

## 2015-01-03 NOTE — Anesthesia Preprocedure Evaluation (Signed)
Anesthesia Evaluation  Patient identified by MRN, date of birth, ID band Patient awake and Patient confused    Reviewed: Allergy & Precautions, NPO status , Patient's Chart, lab work & pertinent test results, Unable to perform ROS - Chart review only  Airway Mallampati: II  TM Distance: >3 FB Neck ROM: Full    Dental no notable dental hx.    Pulmonary asthma ,  breath sounds clear to auscultation  Pulmonary exam normal       Cardiovascular Normal cardiovascular exam+ pacemaker Rhythm:Regular Rate:Normal     Neuro/Psych dementia negative psych ROS   GI/Hepatic negative GI ROS, Neg liver ROS,   Endo/Other  negative endocrine ROS  Renal/GU negative Renal ROS  negative genitourinary   Musculoskeletal negative musculoskeletal ROS (+)   Abdominal   Peds negative pediatric ROS (+)  Hematology negative hematology ROS (+)   Anesthesia Other Findings   Reproductive/Obstetrics negative OB ROS                             Anesthesia Physical Anesthesia Plan  ASA: III  Anesthesia Plan: Spinal   Post-op Pain Management:    Induction: Intravenous  Airway Management Planned: Simple Face Mask  Additional Equipment:   Intra-op Plan:   Post-operative Plan:   Informed Consent: I have reviewed the patients History and Physical, chart, labs and discussed the procedure including the risks, benefits and alternatives for the proposed anesthesia with the patient or authorized representative who has indicated his/her understanding and acceptance.   Dental advisory given  Plan Discussed with: CRNA  Anesthesia Plan Comments:         Anesthesia Quick Evaluation

## 2015-01-03 NOTE — Interval H&P Note (Signed)
OK for surgery PD 

## 2015-01-03 NOTE — Telephone Encounter (Signed)
Left message for pt to call back  °

## 2015-01-03 NOTE — Progress Notes (Signed)
Initial Nutrition Assessment  DOCUMENTATION CODES:   Non-severe (moderate) malnutrition in context of acute illness/injury  INTERVENTION:   Diet advancement per MD When diet is advanced, provide Ensure Enlive po BID, each supplement provides 350 kcal and 20 grams of protein RD to continue to monitor  NUTRITION DIAGNOSIS:   Malnutrition related to acute illness as evidenced by mild depletion of body fat, moderate depletions of muscle mass.  GOAL:   Patient will meet greater than or equal to 90% of their needs  MONITOR:   Diet advancement, Labs, Weight trends, Skin, I & O's  REASON FOR ASSESSMENT:   Consult Hip fracture protocol  ASSESSMENT:   80 y.o. female with bellow PMH who was brought to the ED due to pain for about two weeks which has worsen significantly the last several days.  When seen the patient was unable to provide much history due to memory problems, pain and previous sedation from medication.  Pt in room with no family present. Pt reports eating well with good appetite PTA. Pt has had weight gain over the last month, reports UBW as 130 lb. Pt is scheduled for surgery today for hip fx.   Pt would like Ensure supplements after diet is advanced.   Nutrition-Focused physical exam completed. Findings are mild fat depletion, moderate muscle depletion, and no edema.   Labs reviewed: Elevated BUN  Diet Order:  Diet NPO time specified Except for: Ice Chips  Skin:  Reviewed, no issues  Last BM:  PTA  Height:   Ht Readings from Last 1 Encounters:  01/03/15 5\' 7"  (1.702 m)    Weight:   Wt Readings from Last 1 Encounters:  01/03/15 147 lb 14.9 oz (67.1 kg)    Ideal Body Weight:  61.4 kg  Wt Readings from Last 10 Encounters:  01/03/15 147 lb 14.9 oz (67.1 kg)  12/29/14 140 lb (63.504 kg)  11/29/14 138 lb 12.8 oz (62.959 kg)  10/02/14 133 lb (60.328 kg)  09/14/14 129 lb 3.2 oz (58.605 kg)  08/14/14 124 lb 12.8 oz (56.609 kg)  08/11/14 129 lb 9.6 oz  (58.786 kg)  08/08/14 133 lb (60.328 kg)  07/27/14 133 lb (60.328 kg)  04/09/14 132 lb (59.875 kg)    BMI:  Body mass index is 23.16 kg/(m^2).  Estimated Nutritional Needs:   Kcal:  1700-1900  Protein:  85-95g  Fluid:  1.8L/day  EDUCATION NEEDS:   No education needs identified at this time  Clayton Bibles, MS, RD, LDN Pager: [phone] After Hours Pager: [phone]

## 2015-01-03 NOTE — Progress Notes (Signed)
[facility] PROGRESS NOTE  [name]:[id number] DOB: 06/05/1935 DOA: 01/02/2015 PCP: Elby Showers, MD  Assessment/Plan: 1. Closed right hip fracture- orthopedic surgery has been consulted possible surgery today. 2. Hypothyroidism- continue Synthroid 3. Dementia- continue Namenda  Code Status: Full code Family Communication: *No family at bedside Disposition Plan: SNF   Consultants:  Orthopedics  Procedures:  None  Antibiotics:  None  HPI/Subjective: 80 y.o. female with bellow PMH who was brought to the ED due to pain for about two weeks which has worsen significantly the last several days. She saw her PCP a few days ago, had an X-ray of the area which show a radiolucency. A More recent film shows a subcapital fracture of the right femoral neck and she was referred to the service for admission. When seen the patient was unable to provide much history due to memory problems, pain and previous sedation from medication This morning, patient denies any pain.  Objective: Filed Vitals:   01/03/15 1410  BP: 128/51  Pulse: 64  Temp: 99.1 F (37.3 C)  Resp: 16    Intake/Output Summary (Last 24 hours) at 01/03/15 1703 Last data filed at 01/03/15 1654  Gross per 24 hour  Intake   1300 ml  Output   1525 ml  Net   -225 ml   Filed Weights   01/03/15 0222  Weight: 67.1 kg (147 lb 14.9 oz)    Exam:   General: Appears in no acute distress    Cardiovascular: S1-S2 regular  Respiratory: Clear to auscultation bilaterally  Abdomen: Soft, nontender, no organomegaly  Musculoskeletal: No edema. Lower extremities   Data Reviewed: Basic Metabolic Panel:  Recent Labs Lab 01/02/15 1819  NA 136  K 3.6  CL 106  CO2 21*  GLUCOSE 94  BUN 29*  CREATININE 0.84  CALCIUM 8.8*   Liver Function Tests: No results for input(s): AST, ALT, ALKPHOS, BILITOT, PROT, ALBUMIN in the last 168 hours. No results for input(s): LIPASE, AMYLASE in the last 168 hours. No  results for input(s): AMMONIA in the last 168 hours. CBC:  Recent Labs Lab 01/02/15 1819  WBC 9.2  NEUTROABS 7.0  HGB 12.9  HCT 38.2  MCV 91.4  PLT 155   Cardiac Enzymes: No results for input(s): CKTOTAL, CKMB, CKMBINDEX, TROPONINI in the last 168 hours. BNP (last 3 results) No results for input(s): BNP in the last 8760 hours.  ProBNP (last 3 results) No results for input(s): PROBNP in the last 8760 hours.  CBG: No results for input(s): GLUCAP in the last 168 hours.  Recent Results (from the past 240 hour(s))  Surgical pcr screen     Status: None   Collection Time: 01/03/15  1:34 AM  Result Value Ref Range Status   MRSA, PCR NEGATIVE NEGATIVE Final   Staphylococcus aureus NEGATIVE NEGATIVE Final    Comment:        The Xpert SA Assay (FDA approved for NASAL specimens in patients over 6 years of age), is one component of a comprehensive surveillance program.  Test performance has been validated by [facility] for patients greater than or equal to 26 year old. It is not intended to diagnose infection nor to guide or monitor treatment.      Studies: Dg Chest 2 View  01/02/2015   CLINICAL DATA:  Right hip pain for 2 weeks  EXAM: CHEST  2 VIEW  COMPARISON:  Radiograph 10/02/2014  FINDINGS: Left-sided pacemaker overlies normal cardiac silhouette. No effusion, infiltrate, pneumothorax. Lungs are  hyperinflated. Chronic bronchitic markings.  IMPRESSION: 1. No acute cardiopulmonary findings 2. Hyperinflated lungs with chronic bronchitic markings.   Electronically Signed   By: Suzy Bouchard M.D.   On: 01/02/2015 18:01   Ct Hip Right Wo Contrast  01/02/2015   CLINICAL DATA:  Hip pain for several weeks. Right hip fracture, possibly pathologic.  EXAM: CT OF THE RIGHT HIP WITHOUT CONTRAST  TECHNIQUE: Multidetector CT imaging of the right hip was performed according to the standard protocol. Multiplanar CT image reconstructions were also generated.  COMPARISON:  Right hip  12/29/2014. CT abdomen and pelvis 08/08/2014.  FINDINGS: Acute transverse fracture of the right femoral neck with varus angulation of the fracture fragments and impaction of the fractures. There is diffuse bone demineralization. No specific destructive or expansile bone lesion is identified. The lesions suggested on plain film appears to represent a circumscribed lucent lesion along the anterior femoral neck, likely representing a benign bones cyst and measuring about 8 mm in diameter. The acetabulum appears intact and there is no dislocation of the hip joint. Soft tissues are unremarkable.  IMPRESSION: Acute transverse impacted fracture of the right femoral neck with varus angulation. Lesion demonstrated at plain films represents a 7 mm circumscribed lucent lesion with benign features.   Electronically Signed   By: Lucienne Capers M.D.   On: 01/02/2015 22:51   Dg Hip Unilat With Pelvis 2-3 Views Right  01/02/2015   CLINICAL DATA:  Right hip pain for 2 weeks.  EXAM: DG HIP (WITH OR WITHOUT PELVIS) 2-3V RIGHT  COMPARISON:  None.  FINDINGS: Subcapital fracture of the right femoral neck with minimal angulation. No dislocation.  IMPRESSION: Acute  subcapital right femoral neck fracture   Electronically Signed   By: Suzy Bouchard M.D.   On: 01/02/2015 17:59   Dg Femur, Min 2 Views Right  01/03/2015   CLINICAL DATA:  Right femoral neck fracture. Hip pain for 2 weeks. No injury.  EXAM: RIGHT FEMUR 2 VIEWS  COMPARISON:  Pelvis and hip 01/02/2015  FINDINGS: Transverse fracture of the right femoral neck with varus angulation again demonstrated. No change in position since previous study. The distal right femur appears intact. No additional fractures or focal bone lesions are demonstrated. Soft tissues are unremarkable.  IMPRESSION: Fracture right femoral neck again demonstrated. Midshaft and distal right femur otherwise unremarkable.   Electronically Signed   By: Lucienne Capers M.D.   On: 01/03/2015 02:08     Scheduled Meds: . [MAR Hold] fluticasone  1 spray Each Nare BID  . [MAR Hold] levothyroxine  75 mcg Oral QAC breakfast  . [MAR Hold] memantine  28 mg Oral QHS  . [MAR Hold] oxybutynin  5 mg Oral QHS  . [MAR Hold] polyethylene glycol  17 g Oral Daily  . [MAR Hold] polyvinyl alcohol  1 drop Both Eyes QID   Continuous Infusions: . lactated ringers 1,000 mL (01/03/15 1506)    Principal Problem:   Closed right hip fracture Active Problems:   Hypothyroidism   Hyperlipidemia    Time spent: 25 min    [facility]  [facility] Pager [phone]*. If 7PM-7AM, please contact night-coverage at [email or web address], password [facility] 01/03/2015, 5:03 PM  LOS: 1 day

## 2015-01-03 NOTE — Transfer of Care (Signed)
Immediate Anesthesia Transfer of Care Note  Patient: [name]  Procedure(s) Performed: Procedure(s): ARTHROPLASTY BIPOLAR HIP (HEMIARTHROPLASTY) (Right)  Patient Location: PACU  Anesthesia Type:MAC and Spinal  Level of Consciousness: awake, alert  and patient cooperative  Airway & Oxygen Therapy: Patient Spontanous Breathing and Patient connected to face mask oxygen  Post-op Assessment: Report given to RN and Post -op Vital signs reviewed and stable  Post vital signs: Reviewed and stable  Last Vitals:  Filed Vitals:   01/03/15 1410  BP: 128/51  Pulse: 64  Temp: 37.3 C  Resp: 16    Complications: No apparent anesthesia complications

## 2015-01-03 NOTE — Clinical Social Work Note (Signed)
Clinical Social Work Assessment  Patient Details  Name: [name] MRN: [id number] Date of Birth: [birth year]  Date of referral:  01/03/15               Reason for consult:  Discharge Planning                Permission sought to share information with:    Permission granted to share information::     Name::        Agency::     Relationship::     Contact Information:     Housing/Transportation Living arrangements for the past 2 months:  Single Family Home Source of Information:  Patient, Spouse Patient Interpreter Needed:  None Criminal Activity/Legal Involvement Pertinent to Current Situation/Hospitalization:  No - Comment as needed Significant Relationships:  Spouse, Adult Children Lives with:  Spouse Do you feel safe going back to the place where you live?  Yes Need for family participation in patient care:  Yes (Comment)  Care giving concerns:  No concerns voiced at this time.   Social Worker assessment / plan:  Pt hospitalized on 01/02/15 with a hip fx. Surgery is scheduled for this afternoon. CSW met with pt / spouse to assist with d/c planning. Pt is alert and oriented but has a memory deficit. Spouse expects pt will want to return home following hospital d/c. CSW will meet again with pt / spouse following surgery and PT evaluation / recommendations.  Employment status:  Retired Forensic scientist:    PT Recommendations:  Not assessed at this time Information / Referral to community resources:  [place]  Patient/Family's Response to care:  Plan of care to be determined.  Patient/Family's Understanding of and Emotional Response to Diagnosis, Current Treatment, and Prognosis:  " I don't know how I fx my hip. I never fell. " Pt is anxious to have surgery completed.   Emotional Assessment Appearance:  Appears younger than stated age Attitude/Demeanor/Rapport:  Other (restless) Affect (typically observed):  Anxious, Other (uncomfortable) Orientation:   Oriented to Self, Oriented to Place, Oriented to  Time, Oriented to Situation Alcohol / Substance use:  Alcohol Use Psych involvement (Current and /or in the community):  No (Comment)  Discharge Needs  Concerns to be addressed:  Discharge Planning Concerns Readmission within the last 30 days:  No Current discharge risk:  None Barriers to Discharge:  No Barriers Identified   Luretha Rued, [place] 01/03/2015, 3:03 PM

## 2015-01-03 NOTE — H&P (View-Only) (Signed)
[name], MD  Chauncey Cruel, PA-C  Loni Dolly, PA-C                                  [facility]                [address] [place], [place]  [address]   [name]            MRN:  [id number] DOB/SEX:  1935-02-13/female     CHIEF COMPLAINT:  Painful right hip  HISTORY: [name] a 80 y.o. female with some memory issues.  Has had some hip pain for several weeks and actually had xray a few days ago which revealed lytic area in femoral neck.  Subsequently has had severe increase in discomfort and can no longer walk.  Came to ED and xray shows displaced femoral neck fracture.  ORS consulted for management.  Denies pain elsewhere.   PAST MEDICAL HISTORY: Patient Active Problem List   Diagnosis Date Noted  . Closed right hip fracture 01/02/2015  . Weight loss, unintentional 02/19/2014  . Dementia arising in the senium and presenium 01/30/2014  . 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 Memory loss 07/07/2013  . Memory retention disorder 03/22/2013  . Pacemaker 03/09/2013  . Complete heart block -intermittent   . Syncope 11/18/2012  . Personal history of adenomatous colonic polyps 11/12/2011  . History of TIA (transient ischemic attack) 09/19/2011  . Hypothyroidism 01/13/2011  . Hyperlipidemia 01/13/2011  . First degree AV block 01/13/2011  . Gait disturbance 01/13/2011   Past Medical History  Diagnosis Date  . Hyperlipidemia   . Thyroid disease   . Arthritis   . Asthma   . Depression   . Colon polyps   . Environmental allergies   . Complete heart block -intermittent     a. 11/2012 s/p SJM [facility] DR RF dc ppm.  . Osteoarthritis     hands  . Memory retention disorder 03/22/2013  . Allergy    Past Surgical History  Procedure Laterality Date  . Knee surgery    . Salpingoophorectomy      right  . Appendectomy  1965  . Colonoscopy  2007    adenomatous polyps  .  Insert / replace / remove pacemaker  12/02/2012     Dr Lovena Le  . Hand surgery    . Ovarian cystectomy  1965  . Tumor removed  1985    abdomen  . Cervical polypectomy  1996  . Permanent pacemaker insertion N/A 12/02/2012    Procedure: PERMANENT PACEMAKER INSERTION;  Surgeon: Evans Lance, MD;  Location: [facility] CATH LAB;  Service: Cardiovascular;  Laterality: N/A;  . Eye surgery       MEDICATIONS:   Current facility-administered medications:  .  fluticasone (FLONASE) 50 MCG/ACT nasal spray 1 spray, 1 spray, Each Nare, BID, Reubin Milan, MD .  guaiFENesin [facility]) 12 hr tablet 600 mg, 600 mg, Oral, BID PRN, Reubin Milan, MD .  HYDROcodone-acetaminophen (NORCO/VICODIN) 5-325 MG per tablet 1 tablet, 1 tablet, Oral, Q6H PRN, Reubin Milan, MD .  Derrill Memo ON 01/03/2015] levothyroxine (SYNTHROID, LEVOTHROID) tablet 75 mcg, 75 mcg, Oral, QAC breakfast, Reubin Milan, MD .  memantine (NAMENDA XR) 24 hr capsule 28 mg, 28 mg, Oral, Daily, Reubin Milan, MD .  morphine 2 MG/ML injection 0.5 mg, 0.5 mg, Intravenous, Q2H  PRN, Reubin Milan, MD .  oxybutynin (DITROPAN-XL) 24 hr tablet 5 mg, 5 mg, Oral, QHS, Reubin Milan, MD .  Derrill Memo ON 01/03/2015] polyethylene glycol (MIRALAX / GLYCOLAX) packet 17 g, 17 g, Oral, Daily, Reubin Milan, MD .  polyvinyl alcohol (LIQUIFILM TEARS) 1.4 % ophthalmic solution 1 drop, 1 drop, Both Eyes, QID, Reubin Milan, MD  ALLERGIES:   Allergies  Allergen Reactions  . Sulfa Antibiotics Diarrhea and Rash  . Sulfasalazine Diarrhea and Rash    REVIEW OF SYSTEMS: REVIEWED IN DETAIL IN CHART  FAMILY HISTORY:   Family History  Problem Relation Age of Onset  . Colon cancer Father   . Colon polyps Brother   . Diabetes Brother     maternal aunt  . Coronary artery disease Brother     SOCIAL HISTORY:   History  Substance Use Topics  . Smoking status: Never Smoker   . Smokeless tobacco: Never Used  . Alcohol Use: 0.0 oz/week     0 Standard drinks or equivalent per week     Comment: occas. which is seldom     EXAMINATION: Vital signs in last 24 hours: Temp:  [98.5 F (36.9 C)] 98.5 F (36.9 C) (07/13 1718) Pulse Rate:  [56-65] 65 (07/13 2000) Resp:  [13-20] 15 (07/13 2000) BP: (122-126)/(55-64) 124/61 mmHg (07/13 2000) SpO2:  [89 %-95 %] 93 % (07/13 2000)  BP 124/61 mmHg  Pulse 65  Temp(Src) 98.5 F (36.9 C) (Oral)  Resp 15  SpO2 93%  General Appearance:    Alert, cooperative, no distress, appears stated age  Head:    Normocephalic, without obvious abnormality, atraumatic  Eyes:    PERRL, conjunctiva/corneas clear, EOM's intact, fundi    benign, both eyes  Ears:    Normal TM's and external ear canals, both ears  Nose:   Nares normal, septum midline, mucosa normal, no drainage    or sinus tenderness  Throat:   Lips, mucosa, and tongue normal; teeth and gums normal  Neck:   Supple, symmetrical, trachea midline, no adenopathy;    thyroid:  no enlargement/tenderness/nodules; no carotid   bruit or JVD  Back:     Symmetric, no curvature, ROM normal, no CVA tenderness  Lungs:     Clear to auscultation bilaterally, respirations unlabored  Chest Wall:    No tenderness or deformity   Heart:    Regular rate and rhythm, S1 and S2 normal, no murmur, rub   or gallop  Breast Exam:     Abdomen:     Soft, non-tender, bowel sounds active all four quadrants,    no masses, no organomegaly  Genitalia:     Rectal:     Extremities:   Extremities normal, atraumatic, no cyanosis or edema  Pulses:   2+ and symmetric all extremities  Skin:   Skin color, texture, turgor normal, no rashes or lesions  Lymph nodes:   Cervical, supraclavicular, and axillary nodes normal  Neurologic:   CNII-XII intact, normal strength, sensation and reflexes    throughout    Musculoskeletal Exam:   Right leg shortened and externally rotated.  Painful to motion. Other 3 ext painless   DIAGNOSTIC STUDIES: Recent laboratory  studies:  Recent Labs  01/02/15 1819  WBC 9.2  HGB 12.9  HCT 38.2  PLT 155    Recent Labs  01/02/15 1819  NA 136  K 3.6  CL 106  CO2 21*  BUN 29*  CREATININE 0.84  GLUCOSE 94  CALCIUM 8.8*  Lab Results  Component Value Date   INR 1.13 01/02/2015   INR 0.95 07/17/2009     Recent Radiographic Studies :  Dg Chest 2 View  01/02/2015   CLINICAL DATA:  Right hip pain for 2 weeks  EXAM: CHEST  2 VIEW  COMPARISON:  Radiograph 10/02/2014  FINDINGS: Left-sided pacemaker overlies normal cardiac silhouette. No effusion, infiltrate, pneumothorax. Lungs are hyperinflated. Chronic bronchitic markings.  IMPRESSION: 1. No acute cardiopulmonary findings 2. Hyperinflated lungs with chronic bronchitic markings.   Electronically Signed   By: Suzy Bouchard M.D.   On: 01/02/2015 18:01   Dg Hip Unilat With Pelvis 2-3 Views Right  01/02/2015   CLINICAL DATA:  Right hip pain for 2 weeks.  EXAM: DG HIP (WITH OR WITHOUT PELVIS) 2-3V RIGHT  COMPARISON:  None.  FINDINGS: Subcapital fracture of the right femoral neck with minimal angulation. No dislocation.  IMPRESSION: Acute  subcapital right femoral neck fracture   Electronically Signed   By: Suzy Bouchard M.D.   On: 01/02/2015 17:59   Dg Hip Unilat With Pelvis 2-3 Views Right  12/29/2014   CLINICAL DATA:  Acute right hip pain with no known injury. Initial encounter.  EXAM: DG HIP (WITH OR WITHOUT PELVIS) 2-3V RIGHT  COMPARISON:  CT scan of April 19, 2013.  FINDINGS: There is no evidence of hip fracture or dislocation. No significant degenerative changes noted. Focal cortical lucency is noted along the inferior portion of the right femoral neck which appears to be enlarged compared to prior CT scan.  IMPRESSION: No fracture or dislocation is noted.  Focal cortical lucency is seen along the inferior portion of the right femoral neck which appears to be enlarged compared to prior CT scan. Further evaluation with MRI scan on nonemergent basis is  recommended to rule out neoplasm.   Electronically Signed   By: Marijo Conception, M.D.   On: 12/29/2014 16:33    ASSESSMENT:  Right hip fracture, likely pathologic   PLAN:  Regardless of etiology of this fracture still needs stabilization to allow to sit and potentially stand and walk.  Will order full length plain xray of femur and CT would be nice to make sure no problem with fixation down shaft of femur bone.  Will plan on surgery tomorrow afternoon assuming all approved by medical team. Will try for spinal anesthetic and will plan on sending femoral head to pathology.  Discussed all this over phone with her husband of 50+ years [phone]) and he is in agreement.  Son works as Marine scientist on an Film/video editor elsewhere.  Keldon Lassen G 01/02/2015, 9:45 PM

## 2015-01-03 NOTE — Op Note (Signed)
PRE-OP DIAGNOSIS:  Right Femoral Neck Fracture POST-OP DIAGNOSIS:  Right Femoral Neck Fracture PROCEDURE:  Procedure(s): ARTHROPLASTY UNIPOLAR HIP (HEMIARTHROPLASTY) ANESTHESIA:  Spinal SURGEON:  [name] MD ASSISTANT:  Loni Dolly PA-C   INDICATIONS FOR PROCEDURE:  The patient is a 80 y.o. female with a recent history of a painful hip.  By xray she has a displaced femoral neck fracture.  Hemiarthroplasty is offered as surgical treatment.  Informed operative consent was obtained after discussion of possible complications including reaction to anesthesia, infection, neurovascular injury, dislocation, DVT, PE, and death.  The importance of the postoperative rehab program to optimize result was stressed with the patient.  SUMMARY OF FINDINGS AND PROCEDURE:  Under general anesthesia through a standard posterior approach a right hemiarthroplasty was performed.  The patient had no degenerative change and fair bone quality.  We used DePuy components to replace the hip and these were size 5 summit basic femur capped with a +5 47 unipolar hip ball. Loni Dolly assisted throughout and was invaluable to the completion of the case in that he helped position and retract while I performed the procedure.  He also closed simultaneously to help minimize OR time.  DESCRIPTION OF PROCEDURE:  The patient was taken to the OR suite where spinal anesthetic was applied.  The patient was then positioned in the lateral decubitus position with the right hip up.  All bony prominences were appropriately padded, hip positioners were utilized, and an axillary roll was placed.  Prep and drape was then performed in normal sterile fashion.  The patient was given kefzol preoperative antibiotic and an appropriate time out was performed.  We then took a posterior approach to the right hip.  Dissection was taken through adipose to the IT band and gluteus maximus fascia.  These structures were incised longitudinally to expose the short  external rotators of the hip which were tagged and reflected.  A posterior capsulectomy was performed and a revision femoral neck cut was made above the lesser trochanter and the femoral head was removed.  The acetabulum was exposed and some labral tissues were excised.  The femur was reamed and then broached to the appropriate size.  A trial reduction was done and the aforementioned head and neck assembly gave [place] the best stability in extension with external rotation and flexion with internal rotation.  Leg lengths were felt to be about equal.  The trial components were removed and the wound irrigated.  We then placed a canal plug and then placed cement and the femoral component in appropriate anteversion.  The head was applied to a dry stem neck one cement had hardened and the hip again reduced.  It was again stable in the aforementioned positions.  The would was irrigated again followed by re-approximation of short external rotators to the greater trochanteric region.  IT band and gluteus maximum fascia were repaired with #1 vicryl followed by subcutaneous closure with #O and #2 undyed vicryl.  Skin was closed with staples followed by a sterile dressing.  EBL and IOF can be obtained from anesthesia records.  DISPOSITION:  The patient was extubated in the OR and taken to PACU in stable condition to be admitted back to the medicine service for appropriate post-op care to include perioperative antibiotics and DVT prophylaxis.

## 2015-01-03 NOTE — Telephone Encounter (Signed)
Spoke with pt's husband and Meghna is having surgery at 4:00pm today.

## 2015-01-03 NOTE — Anesthesia Procedure Notes (Addendum)
Procedure Name: MAC Date/Time: 01/03/2015 3:32 PM Performed by: Dione Booze Pre-anesthesia Checklist: Patient identified, Emergency Drugs available, Suction available and Patient being monitored Patient Re-evaluated:Patient Re-evaluated prior to inductionOxygen Delivery Method: Simple face mask Placement Confirmation: positive ETCO2   Spinal Patient location during procedure: OR Staffing Anesthesiologist: Montez Hageman Performed by: anesthesiologist  Preanesthetic Checklist Completed: patient identified, site marked, surgical consent, pre-op evaluation, timeout performed, IV checked, risks and benefits discussed and monitors and equipment checked Spinal Block Patient position: right lateral decubitus Prep: Betadine Patient monitoring: heart rate, continuous pulse ox and blood pressure Approach: left paramedian Location: L3-4 Injection technique: single-shot Needle Needle type: Spinocan  Needle gauge: 22 G Needle length: 9 cm Additional Notes Expiration date of kit checked and confirmed. Patient tolerated procedure well, without complications.

## 2015-01-03 NOTE — Progress Notes (Signed)
Utilization review completed.  

## 2015-01-04 ENCOUNTER — Encounter (HOSPITAL_COMMUNITY): Payer: Self-pay | Admitting: Orthopaedic Surgery

## 2015-01-04 LAB — CBC
HEMATOCRIT: 33.8 % — AB (ref 36.0–46.0)
Hemoglobin: 11.7 g/dL — ABNORMAL LOW (ref 12.0–15.0)
MCH: 31.6 pg (ref 26.0–34.0)
MCHC: 34.6 g/dL (ref 30.0–36.0)
MCV: 91.4 fL (ref 78.0–100.0)
Platelets: 132 10*3/uL — ABNORMAL LOW (ref 150–400)
RBC: 3.7 MIL/uL — ABNORMAL LOW (ref 3.87–5.11)
RDW: 12 % (ref 11.5–15.5)
WBC: 8.4 10*3/uL (ref 4.0–10.5)

## 2015-01-04 LAB — BASIC METABOLIC PANEL
Anion gap: 6 (ref 5–15)
BUN: 18 mg/dL (ref 6–20)
CO2: 26 mmol/L (ref 22–32)
Calcium: 8.2 mg/dL — ABNORMAL LOW (ref 8.9–10.3)
Chloride: 102 mmol/L (ref 101–111)
Creatinine, Ser: 0.68 mg/dL (ref 0.44–1.00)
GFR calc Af Amer: >60 mL/min (ref >60)
GFR calc non Af Amer: >60 mL/min (ref >60)
Glucose, Bld: 109 mg/dL — ABNORMAL HIGH (ref 65–99)
Potassium: 3.3 mmol/L — ABNORMAL LOW (ref 3.5–5.1)
Sodium: 134 mmol/L — ABNORMAL LOW (ref 135–145)

## 2015-01-04 MED ORDER — HYDROCODONE-ACETAMINOPHEN 5-325 MG PO TABS
1.0000 | ORAL_TABLET | ORAL | Status: DC | PRN
  Administered 2015-01-04 – 2015-01-05 (×4): 1 via ORAL
  Filled 2015-01-04 (×4): qty 1

## 2015-01-04 MED ORDER — HYDROCODONE-ACETAMINOPHEN 5-325 MG PO TABS: 1.0000 | ORAL_TABLET | ORAL | Status: DC | PRN

## 2015-01-04 MED ORDER — ASPIRIN 325 MG PO TBEC: 325.0000 mg | DELAYED_RELEASE_TABLET | Freq: Two times a day (BID) | ORAL | Status: DC

## 2015-01-04 MED ORDER — POTASSIUM CHLORIDE CRYS ER 20 MEQ PO TBCR
40.0000 meq | EXTENDED_RELEASE_TABLET | Freq: Once | ORAL | Status: AC
  Administered 2015-01-04: 40 meq via ORAL
  Filled 2015-01-04: qty 2

## 2015-01-04 MED ORDER — ENSURE ENLIVE PO LIQD
237.0000 mL | Freq: Two times a day (BID) | ORAL | Status: DC
  Administered 2015-01-04 – 2015-01-05 (×4): 237 mL via ORAL

## 2015-01-04 NOTE — Progress Notes (Signed)
[facility] PROGRESS NOTE  AZHAR YOGI CLE:[id number] DOB: 06-20-1935 DOA: 01/02/2015 PCP: Elby Showers, MD   HPI/Subjective: 80 y.o. female with a past medical history of hyperlipidemia, thyroid disease, arthritis, and dementia who was brought to the ED due to hip pain for about two weeks which had worsened significantly in the last several days. When seen the patient was unable to provide much history due to memory problems, pain and previous sedation from medication.  Patient endorses mild right hip pain. No other complaints.   Assessment/Plan: 1. Closed right hip fracture   - Surgery consulted;  S/p right hemiarthroplasty yesterday afternoon with no complications  - Ortho recommends to discharge home.  2. Hypothyroidism  - continue Synthroid  3. Dementia  - continue Namenda              Hypokalemia  - replace  potassium and check bmp in am.  Code Status: Full code Family Communication: Husband at bedside Disposition Plan: SNF   Consultants:  Orthopedics  Procedures:  None  Antibiotics:  None    Objective: Filed Vitals:   01/04/15 1034  BP: 112/49  Pulse: 85  Temp: 98.1 F (36.7 C)  Resp: 18    Intake/Output Summary (Last 24 hours) at 01/04/15 1209 Last data filed at 01/04/15 1030  Gross per 24 hour  Intake   3235 ml  Output   1275 ml  Net   1960 ml   Filed Weights   01/03/15 0222  Weight: 67.1 kg (147 lb 14.9 oz)    Exam:   General:  Patient appears in no acute distress.  Cardiovascular: S1-S2 noted.  Respiratory: Clear to auscultation bilaterally  Abdomen: Soft, non-tender, no organomegaly  Musculoskeletal: No edema. No calf pain.   Data Reviewed: Basic Metabolic Panel:  Recent Labs Lab 01/02/15 1819 01/04/15 0445  NA 136 134*  K 3.6 3.3*  CL 106 102  CO2 21* 26  GLUCOSE 94 109*  BUN 29* 18  CREATININE 0.84 0.68  CALCIUM 8.8* 8.2*   Liver Function Tests: No results for input(s): AST, ALT, ALKPHOS, BILITOT,  PROT, ALBUMIN in the last 168 hours. No results for input(s): LIPASE, AMYLASE in the last 168 hours. No results for input(s): AMMONIA in the last 168 hours. CBC:  Recent Labs Lab 01/02/15 1819 01/04/15 0445  WBC 9.2 8.4  NEUTROABS 7.0  --   HGB 12.9 11.7*  HCT 38.2 33.8*  MCV 91.4 91.4  PLT 155 132*   Cardiac Enzymes: No results for input(s): CKTOTAL, CKMB, CKMBINDEX, TROPONINI in the last 168 hours. BNP (last 3 results) No results for input(s): BNP in the last 8760 hours.  ProBNP (last 3 results) No results for input(s): PROBNP in the last 8760 hours.  CBG: No results for input(s): GLUCAP in the last 168 hours.  Recent Results (from the past 240 hour(s))  Surgical pcr screen     Status: None   Collection Time: 01/03/15  1:34 AM  Result Value Ref Range Status   MRSA, PCR NEGATIVE NEGATIVE Final   Staphylococcus aureus NEGATIVE NEGATIVE Final    Comment:        The Xpert SA Assay (FDA approved for NASAL specimens in patients over 53 years of age), is one component of a comprehensive surveillance program.  Test performance has been validated by [facility] for patients greater than or equal to 66 year old. It is not intended to diagnose infection nor to guide or monitor treatment.      Studies:  Dg Chest 2 View  01/02/2015   CLINICAL DATA:  Right hip pain for 2 weeks  EXAM: CHEST  2 VIEW  COMPARISON:  Radiograph 10/02/2014  FINDINGS: Left-sided pacemaker overlies normal cardiac silhouette. No effusion, infiltrate, pneumothorax. Lungs are hyperinflated. Chronic bronchitic markings.  IMPRESSION: 1. No acute cardiopulmonary findings 2. Hyperinflated lungs with chronic bronchitic markings.   Electronically Signed   By: Suzy Bouchard M.D.   On: 01/02/2015 18:01   Pelvis Portable  01/03/2015   CLINICAL DATA:  Postop from right hip arthroplasty  EXAM: PORTABLE PELVIS 1-2 VIEWS  COMPARISON:  CT, 01/02/2015  FINDINGS: Right hip prosthesis appears well seated. The humeral  head portion of the component is well aligned within native acetabulum. There is no new fracture or evidence of an operative complication.  IMPRESSION: Well-aligned right hip hemiarthroplasty.   Electronically Signed   By: Lajean Manes M.D.   On: 01/03/2015 17:49   Ct Hip Right Wo Contrast  01/02/2015   CLINICAL DATA:  Hip pain for several weeks. Right hip fracture, possibly pathologic.  EXAM: CT OF THE RIGHT HIP WITHOUT CONTRAST  TECHNIQUE: Multidetector CT imaging of the right hip was performed according to the standard protocol. Multiplanar CT image reconstructions were also generated.  COMPARISON:  Right hip 12/29/2014. CT abdomen and pelvis 08/08/2014.  FINDINGS: Acute transverse fracture of the right femoral neck with varus angulation of the fracture fragments and impaction of the fractures. There is diffuse bone demineralization. No specific destructive or expansile bone lesion is identified. The lesions suggested on plain film appears to represent a circumscribed lucent lesion along the anterior femoral neck, likely representing a benign bones cyst and measuring about 8 mm in diameter. The acetabulum appears intact and there is no dislocation of the hip joint. Soft tissues are unremarkable.  IMPRESSION: Acute transverse impacted fracture of the right femoral neck with varus angulation. Lesion demonstrated at plain films represents a 7 mm circumscribed lucent lesion with benign features.   Electronically Signed   By: Lucienne Capers M.D.   On: 01/02/2015 22:51   Dg Hip Unilat With Pelvis 2-3 Views Right  01/02/2015   CLINICAL DATA:  Right hip pain for 2 weeks.  EXAM: DG HIP (WITH OR WITHOUT PELVIS) 2-3V RIGHT  COMPARISON:  None.  FINDINGS: Subcapital fracture of the right femoral neck with minimal angulation. No dislocation.  IMPRESSION: Acute  subcapital right femoral neck fracture   Electronically Signed   By: Suzy Bouchard M.D.   On: 01/02/2015 17:59   Dg Femur, Min 2 Views Right  01/03/2015    CLINICAL DATA:  Right femoral neck fracture. Hip pain for 2 weeks. No injury.  EXAM: RIGHT FEMUR 2 VIEWS  COMPARISON:  Pelvis and hip 01/02/2015  FINDINGS: Transverse fracture of the right femoral neck with varus angulation again demonstrated. No change in position since previous study. The distal right femur appears intact. No additional fractures or focal bone lesions are demonstrated. Soft tissues are unremarkable.  IMPRESSION: Fracture right femoral neck again demonstrated. Midshaft and distal right femur otherwise unremarkable.   Electronically Signed   By: Lucienne Capers M.D.   On: 01/03/2015 02:08    Scheduled Meds: . aspirin EC  325 mg Oral BID AC & HS  . docusate sodium  100 mg Oral BID  . feeding supplement (ENSURE ENLIVE)  237 mL Oral BID BM  . fluticasone  1 spray Each Nare BID  . levothyroxine  75 mcg Oral QAC breakfast  . memantine  28 mg Oral QHS  . oxybutynin  5 mg Oral QHS  . polyethylene glycol  17 g Oral Daily  . polyvinyl alcohol  1 drop Both Eyes QID   Continuous Infusions:   Principal Problem:   Closed right hip fracture Active Problems:   Hypothyroidism   Hyperlipidemia    Time spent: 25 minutes    Brandi L. Guerry Bruin, PA-S  [facility] Pager [phone]. If 7PM-7AM, please contact night-coverage at [email or web address], password [facility] 01/04/2015, 12:09 PM  LOS: 2 days

## 2015-01-04 NOTE — Progress Notes (Signed)
CSW assisting with d/c planning. ST Rehab is needed and pt/ spouse are interested in [facility]. CSW spoke with Admissions Coordinator earlier in the day and was told a bed would be available SAT if pt was ready for d/c. Message left for [facility] to confirm pt is accepting bed offer. Awaiting return call. Weekend CSW will assist with d/c planning.   Werner Lean LCSW [phone]

## 2015-01-04 NOTE — Progress Notes (Signed)
Subjective: 1 Day Post-Op Procedure(s) (LRB): ARTHROPLASTY BIPOLAR HIP (HEMIARTHROPLASTY) (Right)   Patient is resting comfortably in bed. She has some pain around her hip but nothing out of the ordinary.  Activity level:  WBAT with posterior hip precautions Diet tolerance:  ok Voiding:  ok Patient reports pain as mild and moderate.    Objective: Vital signs in last 24 hours: Temp:  [97.5 F (36.4 C)-99.1 F (37.3 C)] 98.7 F (37.1 C) (07/15 0408) Pulse Rate:  [60-81] 72 (07/15 0408) Resp:  [11-18] 18 (07/15 0408) BP: (106-136)/(49-68) 134/68 mmHg (07/15 0408) SpO2:  [95 %-100 %] 97 % (07/15 0408)  Labs:  Recent Labs  01/02/15 1819 01/04/15 0445  HGB 12.9 11.7*    Recent Labs  01/02/15 1819 01/04/15 0445  WBC 9.2 8.4  RBC 4.18 3.70*  HCT 38.2 33.8*  PLT 155 132*    Recent Labs  01/02/15 1819 01/04/15 0445  NA 136 134*  K 3.6 3.3*  CL 106 102  CO2 21* 26  BUN 29* 18  CREATININE 0.84 0.68  GLUCOSE 94 109*  CALCIUM 8.8* 8.2*    Recent Labs  01/02/15 1824  INR 1.13    Physical Exam:  Neurologically intact ABD soft Neurovascular intact Sensation intact distally Intact pulses distally Dorsiflexion/Plantar flexion intact Incision: dressing C/D/I and no drainage No cellulitis present Compartment soft  Assessment/Plan:  1 Day Post-Op Procedure(s) (LRB): ARTHROPLASTY BIPOLAR HIP (HEMIARTHROPLASTY) (Right) Advance diet Up with therapy  We greatly appreciate medical teams management. We will place her on ASA 325mg  BID x 1 month post op. We will continue hydrocodone for post op pain control. Continue WBAT with posterior hip precautions and knee immobilizer on while in bed. Follow up in office 2 weeks post op.  [name], [name] 01/04/2015, 8:10 AM

## 2015-01-04 NOTE — Progress Notes (Signed)
CSW met with pt's spouse this afternoon to assist with d/c planning. Spouse is planning to take pt home at d/c. He will be hiring additional help at home. RNCM has been updated and will assist with home health services. CSW will remain available in case plan changes and ST Rehab is needed.  Werner Lean LCSW [phone]

## 2015-01-04 NOTE — Evaluation (Signed)
Occupational Therapy Evaluation Patient Details Name: [name] MRN: [id number] DOB: Oct 28, 1934 Today's Date: 01/04/2015    History of Present Illness s/p R hemiarthroplasty due to hip fx   Clinical Impression   This 80 year old female was admitted for the above surgery. She has a h/o decreased memory and now has posterior THPs to follow.  She was independent with adls prior to admission.  She will benefit from skilled OT to increase safety and independence with mobility related to ADLs and also husband will benefit from education re: precautions so that he can safely assist and cue pt.    Follow Up Recommendations  SNF (VS 24/7 assistance and HHOT, depending upon progress)    Equipment Recommendations  3 in 1 bedside comode    Recommendations for Other Services       Precautions / Restrictions Precautions Precautions: Posterior Hip Precaution Booklet Issued: Yes (comment) Restrictions Weight Bearing Restrictions: No      Mobility Bed Mobility Overal bed mobility: Needs Assistance;+2 for physical assistance Bed Mobility: Supine to Sit     Supine to sit: Mod assist     General bed mobility comments: cues for technique. Assist for RLE and assist for trunk to sit up.  Cues for THPs  Transfers                 General transfer comment: NT due to BP    Balance                                            ADL Overall ADL's : Needs assistance/impaired                                       General ADL Comments: Pt sat EOB and was dizzy.  Could not get BP in sitting in a timely manner, so took once she was assisted back to supine:  98/44.  Pt is able to perform UB adls with supervision due to posterior THPs.  LB not fully assessed due to dizziness.  (clinical judgment mod to max A for LB).  Pt moved well for bed mobility with cueing.  Reviewed posterior precautions with both pt and spouse.  He is HOH.       Vision      Perception     Praxis      Pertinent Vitals/Pain Pain Assessment: Faces Faces Pain Scale: Hurts little more Pain Location: R hip Pain Descriptors / Indicators: Aching Pain Intervention(s): Limited activity within patient's tolerance;Monitored during session;Premedicated before session;Repositioned;Ice applied     Hand Dominance     Extremity/Trunk Assessment Upper Extremity Assessment Upper Extremity Assessment: Overall WFL for tasks assessed           Communication Communication Communication: No difficulties   Cognition Arousal/Alertness: Awake/alert Behavior During Therapy: WFL for tasks assessed/performed Overall Cognitive Status: History of cognitive impairments - at baseline (h/o decreased STM)                     General Comments       Exercises       Shoulder Instructions      Home Living Family/patient expects to be discharged to:: Private residence Living Arrangements: Spouse/significant other  Bathroom Toilet: Standard         Additional Comments: husband present and said he will have help during the day; did not answer shower question, saying the caregiver would assist       Prior Functioning/Environment Level of Independence: Independent             OT Diagnosis: Acute pain   OT Problem List: Decreased activity tolerance;Decreased cognition;Decreased knowledge of use of DME or AE;Decreased knowledge of precautions;Pain   OT Treatment/Interventions: Self-care/ADL training;DME and/or AE instruction;Patient/family education;Cognitive remediation/compensation    OT Goals(Current goals can be found in the care plan section) Acute Rehab OT Goals Patient Stated Goal: Husband's:  to take pt home OT Goal Formulation: With patient/family Time For Goal Achievement: 01/11/15 Potential to Achieve Goals: Good ADL Goals Pt Will Transfer to Toilet: with min guard assist;ambulating;stand pivot transfer;bedside  commode (vs) Pt Will Perform Toileting - Clothing Manipulation and hygiene: with min guard assist;sit to/from stand Additional ADL Goal #1: Husband will assist pt with bed mobility. following THPS in preparation for toilet transfers without cues Additional ADL Goal #2: Husband will cue pt for posterior THPs during toilet transfers, bed mobility and ADLs independently  OT Frequency: Min 2X/week   Barriers to D/C:            Co-evaluation PT/OT/SLP Co-Evaluation/Treatment: Yes Reason for Co-Treatment: For patient/therapist safety PT goals addressed during session: Mobility/safety with mobility OT goals addressed during session: ADL's and self-care      End of Session Nurse Communication:  (BP)  Activity Tolerance:  (limited by dizziness) Patient left: in bed;with call bell/phone within reach;with bed alarm set;with family/visitor present   Time: 0931-1001 OT Time Calculation (min): 30 min Charges:  OT General Charges $OT Visit: 1 Procedure OT Evaluation $Initial OT Evaluation Tier I: 1 Procedure G-Codes:    Billiejo Sorto 01-21-15, 10:35 AM  Lesle Chris, OTR/L [phone] 2015/01/21

## 2015-01-04 NOTE — Telephone Encounter (Signed)
LMOM for return call

## 2015-01-04 NOTE — Evaluation (Signed)
Physical Therapy Evaluation Patient Details Name: [name] MRN: [id number] DOB: 1934-11-15 Today's Date: 01/04/2015   History of Present Illness  s/p R hemiarthroplasty due to hip fx  Clinical Impression  Pt s/p R hemi-arthroplasty presents with decreased R LE strength/ROM, post op pain, posterior THP and pre-existing cognitive deficits limiting functional mobility.  Pt and spouse hope to progress to dc home with hired assist and HHPT follow up.  Husband understands that follow up rehab at SNF level is an option dependent on acute stay progress.    Follow Up Recommendations Home health PT;SNF (dependent on acute stay progress and assist level at home)    Equipment Recommendations  None recommended by PT    Recommendations for Other Services OT consult     Precautions / Restrictions Precautions Precautions: Posterior Hip Precaution Booklet Issued: Yes (comment) Restrictions Weight Bearing Restrictions: No Other Position/Activity Restrictions: WBAT      Mobility  Bed Mobility Overal bed mobility: Needs Assistance;+2 for physical assistance Bed Mobility: Supine to Sit;Sit to Supine     Supine to sit: Mod assist Sit to supine: Max assist;+2 for physical assistance   General bed mobility comments: cues for technique. Assist for RLE and assist for trunk to sit up.  Cues for THPs.  Max assist back to bed with pt c/o dizziness and feeling hot  Transfers                 General transfer comment: NT due to BP  Ambulation/Gait                Stairs            Wheelchair Mobility    Modified Rankin (Stroke Patients Only)       Balance Overall balance assessment: Needs assistance Sitting-balance support: Bilateral upper extremity supported;Feet supported Sitting balance-Leahy Scale: Poor                                       Pertinent Vitals/Pain Pain Assessment: Faces Faces Pain Scale: Hurts little more Pain Location: R  hip Pain Descriptors / Indicators: Aching;Sore Pain Intervention(s): Limited activity within patient's tolerance;Monitored during session;Premedicated before session;Ice applied    Home Living Family/patient expects to be discharged to:: Private residence Living Arrangements: Spouse/significant other Available Help at Discharge: Family;Home health;Personal care attendant Type of Home: House Home Access: Stairs to enter Entrance Stairs-Rails: Psychiatric nurse of Steps: 4 Home Layout: One level Home Equipment: Environmental consultant - 2 wheels;Cane - single point Additional Comments: husband present and said he will have help during the day; did not answer shower question, saying the caregiver would assist     Prior Function Level of Independence: Independent               Hand Dominance        Extremity/Trunk Assessment   Upper Extremity Assessment: Overall WFL for tasks assessed           Lower Extremity Assessment: RLE deficits/detail      Cervical / Trunk Assessment: Normal  Communication   Communication: No difficulties  Cognition Arousal/Alertness: Awake/alert Behavior During Therapy: WFL for tasks assessed/performed Overall Cognitive Status: History of cognitive impairments - at baseline                      General Comments      Exercises Total Joint Exercises Ankle Circles/Pumps: AROM;Both;15  reps;Supine Heel Slides: AAROM;Right;15 reps;Supine Hip ABduction/ADduction: AAROM;Right;10 reps;Supine      Assessment/Plan    PT Assessment Patient needs continued PT services  PT Diagnosis Difficulty walking   PT Problem List Decreased strength;Decreased range of motion;Decreased activity tolerance;Decreased mobility;Decreased knowledge of use of DME;Pain;Decreased safety awareness;Decreased knowledge of precautions  PT Treatment Interventions DME instruction;Gait training;Stair training;Functional mobility training;Therapeutic  activities;Therapeutic exercise;Patient/family education   PT Goals (Current goals can be found in the Care Plan section) Acute Rehab PT Goals Patient Stated Goal: Husband's:  to take pt home PT Goal Formulation: With patient Time For Goal Achievement: 01/11/15 Potential to Achieve Goals: Fair    Frequency 7X/week   Barriers to discharge        Co-evaluation PT/OT/SLP Co-Evaluation/Treatment: Yes Reason for Co-Treatment: For patient/therapist safety PT goals addressed during session: Mobility/safety with mobility OT goals addressed during session: ADL's and self-care       End of Session Equipment Utilized During Treatment: Gait belt Activity Tolerance: Other (comment) (ltd by dizziness/decreased BP) Patient left: in bed;with call bell/phone within reach;with family/visitor present Nurse Communication: Mobility status         Time: 0935-1005 PT Time Calculation (min) (ACUTE ONLY): 30 min   Charges:   PT Evaluation $Initial PT Evaluation Tier I: 1 Procedure     PT G Codes:        Allan Bacigalupi 01/31/2015, 12:26 PM

## 2015-01-04 NOTE — Progress Notes (Signed)
Physical Therapy Treatment Patient Details Name: [name] MRN: [id number] DOB: 1934/06/28 Today's Date: 01/04/2015    History of Present Illness s/p R hemiarthroplasty due to hip fx    PT Comments    Improvement in activity tolerance vs am but with continued c/o dizziness with position change (orthostatics recorded by RN and ok but HR elevated to 121)  Follow Up Recommendations  SNF     Equipment Recommendations  None recommended by PT    Recommendations for Other Services OT consult     Precautions / Restrictions Precautions Precautions: Posterior Hip Precaution Booklet Issued: Yes (comment) Precaution Comments: Pt recalls NO THP - memory deficits 2* dementia Restrictions Weight Bearing Restrictions: No Other Position/Activity Restrictions: WBAT    Mobility  Bed Mobility Overal bed mobility: Needs Assistance;+2 for physical assistance Bed Mobility: Supine to Sit     Supine to sit: Min assist;+2 for physical assistance;+2 for safety/equipment     General bed mobility comments: cues for technique. Assist for RLE and assist for trunk to sit up.  Cues for THPs.  Max assist back to bed with pt c/o dizziness and feeling hot  Transfers Overall transfer level: Needs assistance Equipment used: Rolling walker (2 wheeled) Transfers: Sit to/from Stand Sit to Stand: Min assist;+2 physical assistance;+2 safety/equipment         General transfer comment: Constant cues for LE management, use of UEs to self assist and adherence to THP  Ambulation/Gait Ambulation/Gait assistance: Min assist;+2 physical assistance;+2 safety/equipment Ambulation Distance (Feet): 27 Feet Assistive device: Rolling walker (2 wheeled) Gait Pattern/deviations: Step-to pattern;Shuffle;Trunk flexed     General Gait Details: cues for sequence, posture, position from RW, stride length and ER on R   Stairs            Wheelchair Mobility    Modified Rankin (Stroke Patients Only)       Balance Overall balance assessment: Needs assistance Sitting-balance support: Feet supported Sitting balance-Leahy Scale: Fair     Standing balance support: Bilateral upper extremity supported Standing balance-Leahy Scale: Poor                      Cognition Arousal/Alertness: Awake/alert Behavior During Therapy: WFL for tasks assessed/performed Overall Cognitive Status: History of cognitive impairments - at baseline                      Exercises      General Comments        Pertinent Vitals/Pain Pain Assessment: Faces Faces Pain Scale: Hurts little more Pain Location: R hip Pain Descriptors / Indicators: Aching;Sore Pain Intervention(s): Limited activity within patient's tolerance;Monitored during session;Premedicated before session;Ice applied    Home Living                      Prior Function            PT Goals (current goals can now be found in the care plan section) Acute Rehab PT Goals Patient Stated Goal: Rehab prior to return home PT Goal Formulation: With patient Time For Goal Achievement: 01/11/15 Potential to Achieve Goals: Fair Progress towards PT goals: Progressing toward goals    Frequency  7X/week    PT Plan Discharge plan needs to be updated    Co-evaluation             End of Session Equipment Utilized During Treatment: Gait belt Activity Tolerance: Other (comment) (Ltd by c/o dizziness c position change -  BPs recorded by RN) Patient left: in chair;with call bell/phone within reach;with family/visitor present     Time: 4712-5271 PT Time Calculation (min) (ACUTE ONLY): 31 min  Charges:  $Gait Training: 23-37 mins                    G Codes:      [name] 01-24-15, 3:52 PM

## 2015-01-04 NOTE — Care Management Important Message (Signed)
Important Message  Patient Details  Name: [name] MRN: [id number] Date of Birth: 08/31/1934   Medicare Important Message Given:  Yes-second notification given    Camillo Flaming 01/04/2015, 12:56 [place] Message  Patient Details  Name: [name] MRN: [id number] Date of Birth: [birth year]   Medicare Important Message Given:  Yes-second notification given    Camillo Flaming 01/04/2015, 12:56 PM

## 2015-01-04 NOTE — Clinical Social Work Placement (Signed)
   CLINICAL SOCIAL WORK PLACEMENT  NOTE  Date:  01/04/2015  Patient Details  Name: [name] MRN: [id number] Date of Birth: [birth year]  Clinical Social Work is seeking post-discharge placement for this patient at the [place] level of care (*CSW will initial, date and re-position this form in  chart as items are completed):  Yes   Patient/family provided with [facility] Work Department's list of facilities offering this level of care within the geographic area requested by the patient (or if unable, by the patient's family).  Yes   Patient/family informed of their freedom to choose among providers that offer the needed level of care, that participate in Medicare, Medicaid or managed care program needed by the patient, have an available bed and are willing to accept the patient.  Yes   Patient/family informed of [facility]'s ownership interest in [facility] and [facility], as well as of the fact that they are under no obligation to receive care at these facilities.  PASRR submitted to EDS on 01/04/15     PASRR number received on 01/04/15     Existing PASRR number confirmed on       FL2 transmitted to all facilities in geographic area requested by pt/family on 01/04/15     FL2 transmitted to all facilities within larger geographic area on       Patient informed that his/her managed care company has contracts with or will negotiate with certain facilities, including the following:        Yes   Patient/family informed of bed offers received.  Patient chooses bed at  [facility])     Physician recommends and patient chooses bed at      Patient to be transferred to   on  .  Patient to be transferred to facility by       Patient family notified on   of transfer.  Name of family member notified:        PHYSICIAN       Additional Comment:    _______________________________________________ Loraine Maple  [phone] 01/04/2015, 3:16 PM

## 2015-01-05 LAB — BASIC METABOLIC PANEL
Anion gap: 6 (ref 5–15)
BUN: 26 mg/dL — ABNORMAL HIGH (ref 6–20)
CALCIUM: 8.6 mg/dL — AB (ref 8.9–10.3)
CO2: 28 mmol/L (ref 22–32)
Chloride: 103 mmol/L (ref 101–111)
Creatinine, Ser: 0.74 mg/dL (ref 0.44–1.00)
GLUCOSE: 111 mg/dL — AB (ref 65–99)
Potassium: 3.9 mmol/L (ref 3.5–5.1)
SODIUM: 137 mmol/L (ref 135–145)

## 2015-01-05 LAB — CBC
HCT: 35.1 % — ABNORMAL LOW (ref 36.0–46.0)
Hemoglobin: 11.9 g/dL — ABNORMAL LOW (ref 12.0–15.0)
MCH: 31.3 pg (ref 26.0–34.0)
MCHC: 33.9 g/dL (ref 30.0–36.0)
MCV: 92.4 fL (ref 78.0–100.0)
Platelets: 141 10*3/uL — ABNORMAL LOW (ref 150–400)
RBC: 3.8 MIL/uL — AB (ref 3.87–5.11)
RDW: 12.2 % (ref 11.5–15.5)
WBC: 9.2 10*3/uL (ref 4.0–10.5)

## 2015-01-05 MED ORDER — POLYETHYLENE GLYCOL 3350 17 G PO PACK: 17.0000 g | PACK | Freq: Every day | ORAL | Status: DC | PRN

## 2015-01-05 MED ORDER — DOCUSATE SODIUM 100 MG PO CAPS: 100.0000 mg | ORAL_CAPSULE | Freq: Two times a day (BID) | ORAL | Status: DC

## 2015-01-05 MED ORDER — METHOCARBAMOL 500 MG PO TABS: 500.0000 mg | ORAL_TABLET | Freq: Four times a day (QID) | ORAL | Status: DC | PRN

## 2015-01-05 MED ORDER — BISACODYL 5 MG PO TBEC: 5.0000 mg | DELAYED_RELEASE_TABLET | Freq: Every day | ORAL | Status: DC | PRN

## 2015-01-05 NOTE — Discharge Summary (Signed)
Physician Discharge Summary  [name] KGY:[id number] DOB: 05/01/1935 DOA: 01/02/2015  PCP: Elby Showers, MD  Admit date: 01/02/2015 Discharge date: 01/05/2015  Time spent: *25 minutes  Recommendations for Outpatient Follow-up:  1. *Follow up orthopedics in 2 weeks  Discharge Diagnoses:  Principal Problem:   Closed right hip fracture Active Problems:   Hypothyroidism   Hyperlipidemia   Discharge Condition: Stable  Diet recommendation: regular diet  Filed Weights   01/03/15 0222  Weight: 67.1 kg (147 lb 14.9 oz)    History of present illness:  80 y.o. female with bellow PMH who was brought to the ED due to pain for about two weeks which has worsen significantly the last several days. She saw her PCP a few days ago, had an X-ray of the area which show a radiolucency. A More recent film shows a subcapital fracture of the right femoral neck and she was referred to the service for admission. When seen the patient was unable to provide much history due to memory problems, pain and previous sedation from medication  Hospital Course:  1. Closed right hip fracture - Surgery consulted; S/p right hemiarthroplasty  with no complications - Ortho recommends to start aspirin 325 mg po bid x 2 weeks for DVT prophylaxis  -vicodin prn for pain  -patient to go to SNF  -PT/OT WBAT, THA posterior precautions  DVT Prophylaxis:  ASA 325 mg BID x 2 weeks  DISCHARGE PLAN: Skilled Nursing Facility/Rehab   2. Hypothyroidism - continue Synthroid  3. Dementia - continue Namenda    Hypokalemia - replaced potassium and  Repeat potassium is 3.9  Procedures:  S/p right hemiarthroplasty  Consultations: Orthopedics  Discharge Exam: Filed Vitals:   01/05/15 0526  BP: 117/57  Pulse: 85  Temp: 99.6 F (37.6 C)  Resp: 16    General: Appear in no acute distress Cardiovascular: S1s2 RRR Respiratory: Clear  bilaterally  Discharge Instructions   Discharge Instructions    Diet - low sodium heart healthy    Complete by:  As directed      Increase activity slowly    Complete by:  As directed      Weight bearing as tolerated    Complete by:  As directed   With posterior hip precautions and must have knee immobilizer on while in bed.  Laterality:  right  Extremity:  Lower          Current Discharge Medication List    START taking these medications   Details  aspirin EC 325 MG EC tablet Take 1 tablet (325 mg total) by mouth 2 (two) times daily at 8 am and 10 pm. Qty: 60 tablet, Refills: 0    bisacodyl (DULCOLAX) 5 MG EC tablet Take 1 tablet (5 mg total) by mouth daily as needed for moderate constipation. Qty: 30 tablet, Refills: 0    docusate sodium (COLACE) 100 MG capsule Take 1 capsule (100 mg total) by mouth 2 (two) times daily. Qty: 10 capsule, Refills: 0    HYDROcodone-acetaminophen (NORCO/VICODIN) 5-325 MG per tablet Take 1 tablet by mouth every 4 (four) hours as needed for moderate pain. Qty: 40 tablet, Refills: 0    methocarbamol (ROBAXIN) 500 MG tablet Take 1 tablet (500 mg total) by mouth every 6 (six) hours as needed for muscle spasms. Qty: 30 tablet, Refills: 0    polyethylene glycol (MIRALAX / GLYCOLAX) packet Take 17 g by mouth daily as needed. Qty: 14 each, Refills: 0      CONTINUE these  medications which have NOT CHANGED   Details  fluticasone (FLONASE) 50 MCG/ACT nasal spray Place 1 spray into both nostrils 2 (two) times daily.     guaiFENesin (MUCINEX) 600 MG 12 hr tablet Take 600 mg by mouth 2 (two) times daily as needed for cough or to loosen phlegm.    levothyroxine (SYNTHROID, LEVOTHROID) 75 MCG tablet Take 75 mcg by mouth daily before breakfast.     memantine (NAMENDA XR) 28 MG CP24 24 hr capsule Take 1 capsule (28 mg total) by mouth daily. Qty: 30 capsule, Refills: 3    methylcellulose (ARTIFICIAL TEARS) 1 % ophthalmic solution Place 1 drop into both  eyes 4 (four) times daily.     tolterodine (DETROL) 2 MG tablet TAKE 1 TABLET ONCE TO TWICE DAILY(EVERY 12 HOURS) FOR URINARY FREQUENCY. Qty: 60 tablet, Refills: 5      STOP taking these medications     polyethylene glycol powder (GLYCOLAX/MIRALAX) powder      traMADol (ULTRAM) 50 MG tablet        Allergies  Allergen Reactions  . Sulfa Antibiotics Diarrhea and Rash  . Sulfasalazine Diarrhea and Rash   Follow-up Information    Follow up with Hessie Dibble, MD. Schedule an appointment as soon as possible for a visit in 2 weeks.   Specialty:  Orthopedic Surgery   Contact information:   [place] [address] [phone]        The results of significant diagnostics from this hospitalization (including imaging, microbiology, ancillary and laboratory) are listed below for reference.    Significant Diagnostic Studies: Dg Chest 2 View  01/02/2015   CLINICAL DATA:  Right hip pain for 2 weeks  EXAM: CHEST  2 VIEW  COMPARISON:  Radiograph 10/02/2014  FINDINGS: Left-sided pacemaker overlies normal cardiac silhouette. No effusion, infiltrate, pneumothorax. Lungs are hyperinflated. Chronic bronchitic markings.  IMPRESSION: 1. No acute cardiopulmonary findings 2. Hyperinflated lungs with chronic bronchitic markings.   Electronically Signed   By: Suzy Bouchard M.D.   On: 01/02/2015 18:01   Pelvis Portable  01/03/2015   CLINICAL DATA:  Postop from right hip arthroplasty  EXAM: PORTABLE PELVIS 1-2 VIEWS  COMPARISON:  CT, 01/02/2015  FINDINGS: Right hip prosthesis appears well seated. The humeral head portion of the component is well aligned within native acetabulum. There is no new fracture or evidence of an operative complication.  IMPRESSION: Well-aligned right hip hemiarthroplasty.   Electronically Signed   By: Lajean Manes M.D.   On: 01/03/2015 17:49   Ct Hip Right Wo Contrast  01/02/2015   CLINICAL DATA:  Hip pain for several weeks. Right hip fracture, possibly  pathologic.  EXAM: CT OF THE RIGHT HIP WITHOUT CONTRAST  TECHNIQUE: Multidetector CT imaging of the right hip was performed according to the standard protocol. Multiplanar CT image reconstructions were also generated.  COMPARISON:  Right hip 12/29/2014. CT abdomen and pelvis 08/08/2014.  FINDINGS: Acute transverse fracture of the right femoral neck with varus angulation of the fracture fragments and impaction of the fractures. There is diffuse bone demineralization. No specific destructive or expansile bone lesion is identified. The lesions suggested on plain film appears to represent a circumscribed lucent lesion along the anterior femoral neck, likely representing a benign bones cyst and measuring about 8 mm in diameter. The acetabulum appears intact and there is no dislocation of the hip joint. Soft tissues are unremarkable.  IMPRESSION: Acute transverse impacted fracture of the right femoral neck with varus angulation. Lesion demonstrated at plain  films represents a 7 mm circumscribed lucent lesion with benign features.   Electronically Signed   By: Lucienne Capers M.D.   On: 01/02/2015 22:51   Dg Hip Unilat With Pelvis 2-3 Views Right  01/02/2015   CLINICAL DATA:  Right hip pain for 2 weeks.  EXAM: DG HIP (WITH OR WITHOUT PELVIS) 2-3V RIGHT  COMPARISON:  None.  FINDINGS: Subcapital fracture of the right femoral neck with minimal angulation. No dislocation.  IMPRESSION: Acute  subcapital right femoral neck fracture   Electronically Signed   By: Suzy Bouchard M.D.   On: 01/02/2015 17:59   Dg Hip Unilat With Pelvis 2-3 Views Right  12/29/2014   CLINICAL DATA:  Acute right hip pain with no known injury. Initial encounter.  EXAM: DG HIP (WITH OR WITHOUT PELVIS) 2-3V RIGHT  COMPARISON:  CT scan of April 19, 2013.  FINDINGS: There is no evidence of hip fracture or dislocation. No significant degenerative changes noted. Focal cortical lucency is noted along the inferior portion of the right femoral neck  which appears to be enlarged compared to prior CT scan.  IMPRESSION: No fracture or dislocation is noted.  Focal cortical lucency is seen along the inferior portion of the right femoral neck which appears to be enlarged compared to prior CT scan. Further evaluation with MRI scan on nonemergent basis is recommended to rule out neoplasm.   Electronically Signed   By: Marijo Conception, M.D.   On: 12/29/2014 16:33   Dg Femur, Min 2 Views Right  01/03/2015   CLINICAL DATA:  Right femoral neck fracture. Hip pain for 2 weeks. No injury.  EXAM: RIGHT FEMUR 2 VIEWS  COMPARISON:  Pelvis and hip 01/02/2015  FINDINGS: Transverse fracture of the right femoral neck with varus angulation again demonstrated. No change in position since previous study. The distal right femur appears intact. No additional fractures or focal bone lesions are demonstrated. Soft tissues are unremarkable.  IMPRESSION: Fracture right femoral neck again demonstrated. Midshaft and distal right femur otherwise unremarkable.   Electronically Signed   By: Lucienne Capers M.D.   On: 01/03/2015 02:08    Microbiology: Recent Results (from the past 240 hour(s))  Surgical pcr screen     Status: None   Collection Time: 01/03/15  1:34 AM  Result Value Ref Range Status   MRSA, PCR NEGATIVE NEGATIVE Final   Staphylococcus aureus NEGATIVE NEGATIVE Final    Comment:        The Xpert SA Assay (FDA approved for NASAL specimens in patients over 98 years of age), is one component of a comprehensive surveillance program.  Test performance has been validated by [facility] for patients greater than or equal to 67 year old. It is not intended to diagnose infection nor to guide or monitor treatment.      Labs: Basic Metabolic Panel:  Recent Labs Lab 01/02/15 1819 01/04/15 0445 01/05/15 0508  NA 136 134* 137  K 3.6 3.3* 3.9  CL 106 102 103  CO2 21* 26 28  GLUCOSE 94 109* 111*  BUN 29* 18 26*  CREATININE 0.84 0.68 0.74  CALCIUM 8.8* 8.2*  8.6*   Liver Function Tests: No results for input(s): AST, ALT, ALKPHOS, BILITOT, PROT, ALBUMIN in the last 168 hours. No results for input(s): LIPASE, AMYLASE in the last 168 hours. No results for input(s): AMMONIA in the last 168 hours. CBC:  Recent Labs Lab 01/02/15 1819 01/04/15 0445 01/05/15 0508  WBC 9.2 8.4 9.2  NEUTROABS 7.0  --   --  HGB 12.9 11.7* 11.9*  HCT 38.2 33.8* 35.1*  MCV 91.4 91.4 92.4  PLT 155 132* 141*   Cardiac Enzymes: No results for input(s): CKTOTAL, CKMB, CKMBINDEX, TROPONINI in the last 168 hours. BNP: BNP (last 3 results) No results for input(s): BNP in the last 8760 hours.  ProBNP (last 3 results) No results for input(s): PROBNP in the last 8760 hours.  CBG: No results for input(s): GLUCAP in the last 168 hours.     SignedEleonore Chiquito S  [facility] 01/05/2015, 1:39 PM

## 2015-01-05 NOTE — Progress Notes (Signed)
Patient ID: [name], female   DOB: June 06, 1935, 80 y.o.   MRN: [id number] PATIENT ID: [name]  MRN: [id number]  DOB/AGE:  12-18-1934 / 80 y.o.  2 Days Post-Op Procedure(s) (LRB): ARTHROPLASTY BIPOLAR HIP (HEMIARTHROPLASTY) (Right)    PROGRESS NOTE Subjective: Patient is alert, oriented, no Nausea, no Vomiting, yes passing gas, no Bowel Movement. Taking PO well, ate whole breakfast. Denies SOB, Chest or Calf Pain. Using Incentive Spirometer, PAS in place. Ambulate 27 ft with PT Patient reports pain as 3 on 0-10 scale  .    Objective: Vital signs in last 24 hours: Filed Vitals:   01/04/15 1515 01/04/15 1520 01/04/15 2200 01/05/15 0526  BP: 103/78 122/98 105/53 117/57  Pulse:   99 85  Temp:   98.8 F (37.1 C) 99.6 F (37.6 C)  TempSrc:   Oral Oral  Resp:   18 16  Height:      Weight:      SpO2:   92% 95%      Intake/Output from previous day: I/O last 3 completed shifts: In: 1870 [P.O.:960; I.V.:910] Out: 525 [Urine:525]   Intake/Output this shift:     LABORATORY DATA:  Recent Labs  01/02/15 1824 01/04/15 0445 01/05/15 0508  WBC  --  8.4 9.2  HGB  --  11.7* 11.9*  HCT  --  33.8* 35.1*  PLT  --  132* 141*  NA  --  134* 137  K  --  3.3* 3.9  CL  --  102 103  CO2  --  26 28  BUN  --  18 26*  CREATININE  --  0.68 0.74  GLUCOSE  --  109* 111*  INR 1.13  --   --   CALCIUM  --  8.2* 8.6*    Examination: Neurologically intact ABD soft Neurovascular intact Sensation intact distally Intact pulses distally Dorsiflexion/Plantar flexion intact Incision: dressing C/D/I No cellulitis present Compartment soft} XR AP&Lat of hip shows well placed\fixed HHA  Assessment:   2 Days Post-Op Procedure(s) (LRB): ARTHROPLASTY BIPOLAR HIP (HEMIARTHROPLASTY) (Right) ADDITIONAL DIAGNOSIS:  Expected Acute Blood Loss Anemia, mild dementia, hypothyroid, TIA hx  Plan: PT/OT WBAT, THA  posterior precautions  DVT Prophylaxis: SCDx72 hrs, ASA 325 mg BID x 2 weeks  DISCHARGE  PLAN: Skilled Nursing Facility/Rehab  DISCHARGE NEEDS: HHPT, Walker and 3-in-1 comode seat

## 2015-01-05 NOTE — Progress Notes (Signed)
Pt d/c to [facility]. Report given to Antionette at the facility.

## 2015-01-05 NOTE — Clinical Social Work Placement (Signed)
   CLINICAL SOCIAL WORK PLACEMENT  NOTE  Date:  01/05/2015  Patient Details  Name: [name] MRN: [id number] Date of Birth: Mar 12, 1935  Clinical Social Work is seeking post-discharge placement for this patient at the [place] level of care (*CSW will initial, date and re-position this form in  chart as items are completed):  Yes   Patient/family provided with [facility] Work Department's list of facilities offering this level of care within the geographic area requested by the patient (or if unable, by the patient's family).  Yes   Patient/family informed of their freedom to choose among providers that offer the needed level of care, that participate in Medicare, Medicaid or managed care program needed by the patient, have an available bed and are willing to accept the patient.  Yes   Patient/family informed of [facility]'s ownership interest in [facility] and [facility], as well as of the fact that they are under no obligation to receive care at these facilities.  PASRR submitted to EDS on 01/04/15     PASRR number received on 01/04/15     Existing PASRR number confirmed on       FL2 transmitted to all facilities in geographic area requested by pt/family on 01/04/15     FL2 transmitted to all facilities within larger geographic area on       Patient informed that his/her managed care company has contracts with or will negotiate with certain facilities, including the following:        Yes   Patient/family informed of bed offers received.  Patient chooses bed at  [facility])     Physician recommends and patient chooses bed at      Patient to be transferred to  [facility]  on  January 05, 2015.  Patient to be transferred to facility by  ambulance     Patient family notified on  January 05, 2015 of transfer.  Name of family member notified:   Leonard/ spouse     PHYSICIAN       Additional Comment:     _______________________________________________ Carlean Jews, LCSW 01/05/2015, 3:55 PM

## 2015-01-05 NOTE — Care Management Note (Signed)
Case Management Note  Patient Details  Name: ELGIE LANDINO MRN: [id number] Date of Birth: 04/21/1935  Subjective/Objective:                   Right Femoral Neck Fracture Action/Plan:   Expected Discharge Date:   (unknown)               Expected Discharge Plan:     In-House Referral:  Clinical Social Work  Discharge planning Services  CM Consult  Post Acute Care Choice:    Choice offered to:     DME Arranged:    DME Agency:     HH Arranged:    [place] Agency:     Status of Service:  Completed, signed off  Medicare Important Message Given:   Date Medicare IM Given:    Medicare IM give by:    Date Additional Medicare IM Given:    Additional Medicare Important Message give by:     If discussed at [place] of Stay Meetings, dates discussed:    Additional Comments:  Chart reviewed. CSW is following for SNF placement.  Apolonio Schneiders, RN 01/05/2015, 10:59 AM

## 2015-01-05 NOTE — Discharge Instructions (Signed)
[facility] Stay Proper nutrition can help your body recover from illness and injury.   Foods and beverages high in protein, vitamins, and minerals help rebuild muscle loss, promote healing, & reduce fall risk.   In addition to eating healthy foods, a nutrition shake is an easy, delicious way to get the nutrition you need during and after your hospital stay  It is recommended that you continue to drink 2 bottles per day of:       Ensure for at least 1 month (30 days) after your hospital stay   Tips for adding a nutrition shake into your routine: As allowed, drink one with vitamins or medications instead of water or juice Enjoy one as a tasty mid-morning or afternoon snack Drink cold or make a milkshake out of it Drink one instead of milk with cereal or snacks Use as a coffee creamer   Available at the following grocery stores and pharmacies:           * [facility] [phone]            For COUPONS visit: www.ensure.com/join or http://dawson-may.com/   Suggested Substitutions Ensure Plus = Boost Plus = Carnation Breakfast Essentials = Boost Compact Ensure Active Clear = Boost Breeze Glucerna Shake = Boost Glucose Control = Carnation Breakfast Essentials SUGAR FREE      INSTRUCTIONS AFTER JOINT REPLACEMENT   o Remove items at home which could result in a fall. This includes throw rugs or furniture in walking pathways o ICE to the affected joint every three hours while awake for 30 minutes at a time, for at least the first 3-5 days, and then as needed for pain and swelling.  Continue to use ice for pain and swelling. You may notice swelling that will progress down to the foot and ankle.  This is normal after surgery.  Elevate your leg when you are not up walking on it.   o Continue to use the breathing  machine you got in the hospital (incentive spirometer) which will help keep your temperature down.  It is common for your temperature to cycle up and down following surgery, especially at night when you are not up moving around and exerting yourself.  The breathing machine keeps your lungs expanded and your temperature down.   DIET:  As you were doing prior to hospitalization, we recommend a well-balanced diet.  DRESSING / WOUND CARE / SHOWERING  Keep the surgical dressing until follow up.  The dressing is water proof, so you can shower without any extra covering.  IF THE DRESSING FALLS OFF or the wound gets wet inside, change the dressing with sterile gauze.  Please use good hand washing techniques before changing the dressing.  Do not use any lotions or creams on the incision until instructed by your surgeon.    ACTIVITY  o Increase activity slowly as tolerated, but follow the weight bearing instructions below.   o No driving for 6 weeks or until further direction given by your physician.  You cannot drive while taking narcotics.  o No lifting or carrying greater than 10 lbs. until further directed by your surgeon. o Avoid periods of inactivity such as sitting longer  than an hour when not asleep. This helps prevent blood clots.  o You may return to work once you are authorized by your doctor.     WEIGHT BEARING   Weight bearing as tolerated with assist device (walker, cane, etc) as directed, use it as long as suggested by your surgeon or therapist, typically at least 4-6 weeks.   EXERCISES  Results after joint replacement surgery are often greatly improved when you follow the exercise, range of motion and muscle strengthening exercises prescribed by your doctor. Safety measures are also important to protect the joint from further injury. Any time any of these exercises cause you to have increased pain or swelling, decrease what you are doing until you are comfortable again and then slowly  increase them. If you have problems or questions, call your caregiver or physical therapist for advice.   Rehabilitation is important following a joint replacement. After just a few days of immobilization, the muscles of the leg can become weakened and shrink (atrophy).  These exercises are designed to build up the tone and strength of the thigh and leg muscles and to improve motion. Often times heat used for twenty to thirty minutes before working out will loosen up your tissues and help with improving the range of motion but do not use heat for the first two weeks following surgery (sometimes heat can increase post-operative swelling).   These exercises can be done on a training (exercise) mat, on the floor, on a table or on a bed. Use whatever works the best and is most comfortable for you.    Use music or television while you are exercising so that the exercises are a pleasant break in your day. This will make your life better with the exercises acting as a break in your routine that you can look forward to.   Perform all exercises about fifteen times, three times per day or as directed.  You should exercise both the operative leg and the other leg as well.  Exercises include:    Quad Sets - Tighten up the muscle on the front of the thigh (Quad) and hold for 5-10 seconds.    Straight Leg Raises - With your knee straight (if you were given a brace, keep it on), lift the leg to 60 degrees, hold for 3 seconds, and slowly lower the leg.  Perform this exercise against resistance later as your leg gets stronger.   Leg Slides: Lying on your back, slowly slide your foot toward your buttocks, bending your knee up off the floor (only go as far as is comfortable). Then slowly slide your foot back down until your leg is flat on the floor again.   Angel Wings: Lying on your back spread your legs to the side as far apart as you can without causing discomfort.   Hamstring Strength:  Lying on your back, push  your heel against the floor with your leg straight by tightening up the muscles of your buttocks.  Repeat, but this time bend your knee to a comfortable angle, and push your heel against the floor.  You may put a pillow under the heel to make it more comfortable if necessary.   A rehabilitation program following joint replacement surgery can speed recovery and prevent re-injury in the future due to weakened muscles. Contact your doctor or a physical therapist for more information on knee rehabilitation.    CONSTIPATION  Constipation is defined medically as fewer than three stools per week and severe  constipation as less than one stool per week.  Even if you have a regular bowel pattern at home, your normal regimen is likely to be disrupted due to multiple reasons following surgery.  Combination of anesthesia, postoperative narcotics, change in appetite and fluid intake all can affect your bowels.   YOU MUST use at least one of the following options; they are listed in order of increasing strength to get the job done.  They are all available over the counter, and you may need to use some, POSSIBLY even all of these options:    Drink plenty of fluids (prune juice may be helpful) and high fiber foods Colace 100 mg by mouth twice a day  Senokot for constipation as directed and as needed Dulcolax (bisacodyl), take with full glass of water  Miralax (polyethylene glycol) once or twice a day as needed.  If you have tried all these things and are unable to have a bowel movement in the first 3-4 days after surgery call either your surgeon or your primary doctor.    If you experience loose stools or diarrhea, hold the medications until you stool forms back up.  If your symptoms do not get better within 1 week or if they get worse, check with your doctor.  If you experience "the worst abdominal pain ever" or develop nausea or vomiting, please contact the office immediately for further recommendations for  treatment.   ITCHING:  If you experience itching with your medications, try taking only a single pain pill, or even half a pain pill at a time.  You can also use Benadryl over the counter for itching or also to help with sleep.   TED HOSE STOCKINGS:  Use stockings on both legs until for at least 2 weeks or as directed by physician office. They may be removed at night for sleeping.  MEDICATIONS:  See your medication summary on the After Visit Summary that nursing will review with you.  You may have some home medications which will be placed on hold until you complete the course of blood thinner medication.  It is important for you to complete the blood thinner medication as prescribed.  PRECAUTIONS:  If you experience chest pain or shortness of breath - call 911 immediately for transfer to the hospital emergency department.   If you develop a fever greater that 101 F, purulent drainage from wound, increased redness or drainage from wound, foul odor from the wound/dressing, or calf pain - CONTACT YOUR SURGEON.                                                   FOLLOW-UP APPOINTMENTS:  If you do not already have a post-op appointment, please call the office for an appointment to be seen by your surgeon.  Guidelines for how soon to be seen are listed in your After Visit Summary, but are typically between 1-4 weeks after surgery.  OTHER INSTRUCTIONS:   Knee Replacement:  Do not place pillow under knee, focus on keeping the knee straight while resting. CPM instructions: 0-90 degrees, 2 hours in the morning, 2 hours in the afternoon, and 2 hours in the evening. Place foam block, curve side up under heel at all times except when in CPM or when walking.  DO NOT modify, tear, cut, or change the foam block in any way.  MAKE SURE YOU:   Understand these instructions.   Get help right away if you are not doing well or get worse.    Thank you for letting us be a part of your medical care team.  It is  a privilege we respect greatly.  We hope these instructions will help you stay on track for a fast and full recovery!

## 2015-01-05 NOTE — Progress Notes (Signed)
Physical Therapy Treatment Patient Details Name: [name] MRN: [id number] DOB: 1935-01-19 Today's Date: 01/05/2015    History of Present Illness s/p R hemiarthroplasty due to hip fx    PT Comments    Pt very cooperative and progressing with mobility but ltd by pre-existing dementia related memory deficits and posterior THP.  Follow Up Recommendations  SNF     Equipment Recommendations  None recommended by PT    Recommendations for Other Services OT consult     Precautions / Restrictions Precautions Precautions: Posterior Hip Precaution Booklet Issued: Yes (comment) Precaution Comments: Pt recalls NO THP - memory deficits 2* dementia Restrictions Weight Bearing Restrictions: No Other Position/Activity Restrictions: WBAT    Mobility  Bed Mobility Overal bed mobility: Needs Assistance Bed Mobility: Sit to Supine       Sit to supine: Mod assist   General bed mobility comments: cues for technique. Assist for RLE and assist for trunk to sit up.  Cues for THPs.    Transfers Overall transfer level: Needs assistance Equipment used: Rolling walker (2 wheeled) Transfers: Sit to/from Stand Sit to Stand: Min assist;Mod assist         General transfer comment: Constant cues for LE management, use of UEs to self assist and adherence to THP  Ambulation/Gait Ambulation/Gait assistance: Min assist Ambulation Distance (Feet): 55 Feet Assistive device: Rolling walker (2 wheeled) Gait Pattern/deviations: Step-to pattern;Step-through pattern;Decreased step length - right;Decreased step length - left;Shuffle;Trunk flexed Gait velocity: decr   General Gait Details: cues for sequence, posture, position from RW, stride length and ER on R   Stairs            Wheelchair Mobility    Modified Rankin (Stroke Patients Only)       Balance                                    Cognition Arousal/Alertness: Awake/alert Behavior During Therapy: WFL for  tasks assessed/performed Overall Cognitive Status: History of cognitive impairments - at baseline                      Exercises Total Joint Exercises Ankle Circles/Pumps: AROM;Both;15 reps;Supine Quad Sets: AROM;Both;10 reps;Supine Heel Slides: AAROM;Right;Supine;20 reps Hip ABduction/ADduction: AAROM;Right;Supine;15 reps    General Comments        Pertinent Vitals/Pain Pain Assessment: Faces Faces Pain Scale: Hurts a little bit Pain Location: R hip Pain Descriptors / Indicators: Sore Pain Intervention(s): Limited activity within patient's tolerance;Monitored during session;Premedicated before session;Ice applied    Home Living                      Prior Function            PT Goals (current goals can now be found in the care plan section) Acute Rehab PT Goals Patient Stated Goal: Rehab prior to return home PT Goal Formulation: With patient/family Time For Goal Achievement: 01/11/15 Potential to Achieve Goals: Fair Progress towards PT goals: Progressing toward goals    Frequency  Min 5X/week    PT Plan Current plan remains appropriate    Co-evaluation             End of Session Equipment Utilized During Treatment: Gait belt Activity Tolerance: Patient tolerated treatment well;Patient limited by fatigue Patient left: in bed;with call bell/phone within reach;with family/visitor present     Time: 6734-1937 PT Time Calculation (  min) (ACUTE ONLY): 30 min  Charges:  $Gait Training: 8-22 mins $Therapeutic Exercise: 8-22 mins                    G Codes:      [name] Jan 26, 2015, 12:47 PM

## 2015-01-07 MED ORDER — BUPIVACAINE HCL (PF) 0.5 % IJ SOLN
INTRAMUSCULAR | Status: DC | PRN
Start: 2015-01-03 — End: 2015-01-07
  Administered 2015-01-03: 3 mL

## 2015-01-07 NOTE — Anesthesia Postprocedure Evaluation (Signed)
  Anesthesia Post-op Note  Patient: [name]  Procedure(s) Performed: Procedure(s) (LRB): ARTHROPLASTY BIPOLAR HIP (HEMIARTHROPLASTY) (Right)  Patient Location: PACU  Anesthesia Type: Spinal  Level of Consciousness: awake and alert   Airway and Oxygen Therapy: Patient Spontanous Breathing  Post-op Pain: mild  Post-op Assessment: Post-op Vital signs reviewed, Patient's Cardiovascular Status Stable, Respiratory Function Stable, Patent Airway and No signs of Nausea or vomiting  Last Vitals:  Filed Vitals:   01/05/15 0526  BP: 117/57  Pulse: 85  Temp: 37.6 C  Resp: 16    Post-op Vital Signs: stable   Complications: No apparent anesthesia complications

## 2015-01-25 ENCOUNTER — Encounter: Payer: Self-pay | Admitting: Internal Medicine

## 2015-01-25 ENCOUNTER — Ambulatory Visit (INDEPENDENT_AMBULATORY_CARE_PROVIDER_SITE_OTHER): Payer: Medicare Other | Admitting: Internal Medicine

## 2015-01-25 VITALS — BP 112/64 | HR 72 | Temp 97.7°F | Wt 139.0 lb

## 2015-01-25 DIAGNOSIS — R5383 Other fatigue: Secondary | ICD-10-CM

## 2015-01-25 DIAGNOSIS — R350 Frequency of micturition: Secondary | ICD-10-CM

## 2015-01-25 DIAGNOSIS — E039 Hypothyroidism, unspecified: Secondary | ICD-10-CM | POA: Diagnosis not present

## 2015-01-25 DIAGNOSIS — E559 Vitamin D deficiency, unspecified: Secondary | ICD-10-CM

## 2015-01-25 DIAGNOSIS — S72001D Fracture of unspecified part of neck of right femur, subsequent encounter for closed fracture with routine healing: Secondary | ICD-10-CM | POA: Diagnosis not present

## 2015-01-25 DIAGNOSIS — Z79899 Other long term (current) drug therapy: Secondary | ICD-10-CM

## 2015-01-25 DIAGNOSIS — R269 Unspecified abnormalities of gait and mobility: Secondary | ICD-10-CM | POA: Diagnosis not present

## 2015-01-25 DIAGNOSIS — M6281 Muscle weakness (generalized): Secondary | ICD-10-CM | POA: Diagnosis not present

## 2015-01-25 LAB — POCT URINALYSIS DIPSTICK
BILIRUBIN UA: NEGATIVE
Blood, UA: NEGATIVE
GLUCOSE UA: NEGATIVE
Ketones, UA: NEGATIVE
LEUKOCYTES UA: NEGATIVE
Nitrite, UA: NEGATIVE
PH UA: 6
PROTEIN UA: NEGATIVE
Spec Grav, UA: 1.01
Urobilinogen, UA: NEGATIVE

## 2015-01-25 NOTE — Patient Instructions (Signed)
Continue same meds and return in 6 months. Lab work pending.

## 2015-01-25 NOTE — Progress Notes (Signed)
   Subjective:    Patient ID: [name], female    DOB: 08-25-1934, 80 y.o.   MRN: [id number]  HPI Patient recently discharged from hospital and subsequently [place] after having right hip arthroplasty for acute right transverse femoral neck fracture. Patient does not recall falling. Just recalls having severe right hip pain. She looks very well now. She's ambulating with a cane. Having home physical therapy. Husband with her today. He is supportive.   Review of Systems     Objective:   Physical Exam Skin warm and dry. Nodes none. Neck is supple without JVD thyromegaly or carotid bruits. Chest clear to auscultation. Cardiac exam regular rate and rhythm normal S1 and S2. Extremities without edema       Assessment & Plan:  Status post right hip arthroplasty for fracture  Plan: Nonfasting labs drawn today. Results pending. Urinalysis is normal. Return in 6 months or when necessary.

## 2015-01-26 LAB — CBC WITH DIFFERENTIAL/PLATELET
Basophils Absolute: 0 10*3/uL (ref 0.0–0.1)
Basophils Relative: 0 % (ref 0–1)
Eosinophils Absolute: 0.1 10*3/uL (ref 0.0–0.7)
Eosinophils Relative: 2 % (ref 0–5)
HEMATOCRIT: 36.2 % (ref 36.0–46.0)
HEMOGLOBIN: 12.1 g/dL (ref 12.0–15.0)
LYMPHS ABS: 1.2 10*3/uL (ref 0.7–4.0)
LYMPHS PCT: 19 % (ref 12–46)
MCH: 31.1 pg (ref 26.0–34.0)
MCHC: 33.4 g/dL (ref 30.0–36.0)
MCV: 93.1 fL (ref 78.0–100.0)
MPV: 8.6 fL (ref 8.6–12.4)
Monocytes Absolute: 0.7 10*3/uL (ref 0.1–1.0)
Monocytes Relative: 10 % (ref 3–12)
NEUTROS PCT: 69 % (ref 43–77)
Neutro Abs: 4.5 10*3/uL (ref 1.7–7.7)
Platelets: 283 10*3/uL (ref 150–400)
RBC: 3.89 MIL/uL (ref 3.87–5.11)
RDW: 14.2 % (ref 11.5–15.5)
WBC: 6.5 10*3/uL (ref 4.0–10.5)

## 2015-01-26 LAB — COMPLETE METABOLIC PANEL WITH GFR
ALK PHOS: 127 U/L (ref 33–130)
ALT: 12 U/L (ref 6–29)
AST: 23 U/L (ref 10–35)
Albumin: 3.6 g/dL (ref 3.6–5.1)
BILIRUBIN TOTAL: 0.5 mg/dL (ref 0.2–1.2)
BUN: 21 mg/dL (ref 7–25)
CALCIUM: 9.3 mg/dL (ref 8.6–10.4)
CO2: 24 mmol/L (ref 20–31)
Chloride: 102 mmol/L (ref 98–110)
Creat: 0.63 mg/dL (ref 0.60–0.93)
GFR, Est African American: >89 mL/min (ref >=60)
GFR, Est Non African American: 86 mL/min (ref >=60)
GLUCOSE: 87 mg/dL (ref 65–99)
POTASSIUM: 4.2 mmol/L (ref 3.5–5.3)
Sodium: 138 mmol/L (ref 135–146)
Total Protein: 6.1 g/dL (ref 6.1–8.1)

## 2015-01-26 LAB — TSH: TSH: 0.957 u[IU]/mL (ref 0.350–4.500)

## 2015-01-26 LAB — VITAMIN D 25 HYDROXY (VIT D DEFICIENCY, FRACTURES): VIT D 25 HYDROXY: 27 ng/mL — AB (ref 30–100)

## 2015-01-28 ENCOUNTER — Telehealth: Payer: Self-pay | Admitting: *Deleted

## 2015-01-28 NOTE — Telephone Encounter (Signed)
Spoke with patient husband reviewed lab results and instructions to start patient on Vit D 1000 units daily. Husband verbalized understanding.

## 2015-02-28 ENCOUNTER — Ambulatory Visit (INDEPENDENT_AMBULATORY_CARE_PROVIDER_SITE_OTHER): Payer: Medicare Other | Admitting: *Deleted

## 2015-02-28 ENCOUNTER — Encounter: Payer: Self-pay | Admitting: Internal Medicine

## 2015-02-28 DIAGNOSIS — I442 Atrioventricular block, complete: Secondary | ICD-10-CM

## 2015-02-28 NOTE — Progress Notes (Signed)
Remote pacemaker transmission.   

## 2015-03-01 ENCOUNTER — Ambulatory Visit (INDEPENDENT_AMBULATORY_CARE_PROVIDER_SITE_OTHER): Payer: Medicare Other | Admitting: Cardiology

## 2015-03-01 ENCOUNTER — Encounter: Payer: Self-pay | Admitting: Cardiology

## 2015-03-01 VITALS — BP 110/68 | HR 65 | Ht 68.0 in | Wt 136.0 lb

## 2015-03-01 DIAGNOSIS — I442 Atrioventricular block, complete: Secondary | ICD-10-CM | POA: Diagnosis not present

## 2015-03-01 DIAGNOSIS — R413 Other amnesia: Secondary | ICD-10-CM

## 2015-03-01 DIAGNOSIS — R634 Abnormal weight loss: Secondary | ICD-10-CM | POA: Diagnosis not present

## 2015-03-01 NOTE — Progress Notes (Signed)
Cardiology Office Note   Date:  03/01/2015   ID:  [name], DOB 1934/10/19, MRN [id number]  PCP:  Elby Showers, MD  Cardiologist: Darlin Coco MD  No chief complaint on file.     History of Present Illness: [name] is a 80 y.o. female who presents for a six-month follow-up visit  This 80 year old woman is seen for a scheduled followup office visit. She has a history of intermittent high-grade AV block and has a functioning St. Jude dual-chamber pacemaker. She is followed for this by Dr. Lovena Le. She has a history of hypercholesterolemia. She has a history of osteoarthritis. She has also had some memory issues and is followed by Dr. Brett Fairy. Since last visit she has had no new cardiac symptoms. She has not had any dizziness or syncope. No chest pain. She has had a past history of unintentional weight loss but since her last visit she has had no change in weight. The patient had a chest x-ray on 02/19/14 which showed no abnormality. She complains of feeling tired and worn out. She has had extensive lab work by her PCP which did not show any evidence of malignancy. She had a CT scan of the abdomen and pelvis on 08/08/14 which did not show any abdominal aortic aneurysm or other cause of weight loss. She complains of dyspnea and not being able to get a complete breath. Impression The patient developed a fractured right hip.  Her husband and she notes that she never fell.  Her hip just started to hurt.  X-rays confirmed a fracture and Dr. Rhona Raider operated on her on 01/03/15.  She is walking with a cane.  Past Medical History  Diagnosis Date  . Hyperlipidemia   . Thyroid disease   . Arthritis   . Asthma   . Depression   . Colon polyps   . Environmental allergies   . Complete heart block -intermittent     a. 11/2012 s/p SJM Accent DR RF dc ppm.  . Osteoarthritis     hands  . Memory retention disorder 03/22/2013  . Allergy     Past Surgical History  Procedure Laterality  Date  . Knee surgery    . Salpingoophorectomy      right  . Appendectomy  1965  . Colonoscopy  2007    adenomatous polyps  . Insert / replace / remove pacemaker  12/02/2012     Dr Lovena Le  . Hand surgery    . Ovarian cystectomy  1965  . Tumor removed  1985    abdomen  . Cervical polypectomy  1996  . Permanent pacemaker insertion N/A 12/02/2012    Procedure: PERMANENT PACEMAKER INSERTION;  Surgeon: Evans Lance, MD;  Location: [facility] CATH LAB;  Service: Cardiovascular;  Laterality: N/A;  . Eye surgery    . Hip arthroplasty Right 01/03/2015    Procedure: ARTHROPLASTY BIPOLAR HIP (HEMIARTHROPLASTY);  Surgeon: [name], MD;  Location: [facility] ORS;  Service: Orthopedics;  Laterality: Right;     Current Outpatient Prescriptions  Medication Sig Dispense Refill  . aspirin EC 325 MG EC tablet Take 1 tablet (325 mg total) by mouth 2 (two) times daily at 8 am and 10 pm. 60 tablet 0  . bisacodyl (DULCOLAX) 5 MG EC tablet Take 1 tablet (5 mg total) by mouth daily as needed for moderate constipation. 30 tablet 0  . docusate sodium (COLACE) 100 MG capsule Take 1 capsule (100 mg total) by mouth 2 (two) times daily. 10  capsule 0  . fluticasone (FLONASE) 50 MCG/ACT nasal spray Place 1 spray into both nostrils 2 (two) times daily.     Marland Kitchen guaiFENesin (MUCINEX) 600 MG 12 hr tablet Take 600 mg by mouth 2 (two) times daily as needed for cough or to loosen phlegm.    Marland Kitchen HYDROcodone-acetaminophen (NORCO/VICODIN) 5-325 MG per tablet Take 1 tablet by mouth every 6 (six) hours as needed for moderate pain.    Marland Kitchen levothyroxine (SYNTHROID, LEVOTHROID) 75 MCG tablet Take 75 mcg by mouth daily before breakfast.     . memantine (NAMENDA XR) 28 MG CP24 24 hr capsule Take 1 capsule (28 mg total) by mouth daily. 30 capsule 3  . methocarbamol (ROBAXIN) 500 MG tablet Take 1 tablet (500 mg total) by mouth every 6 (six) hours as needed for muscle spasms. 30 tablet 0  . methylcellulose (ARTIFICIAL TEARS) 1 % ophthalmic solution  Place 1 drop into both eyes 4 (four) times daily.     . polyethylene glycol (MIRALAX / GLYCOLAX) packet Take 17 g by mouth daily as needed (constipation).    . tolterodine (DETROL) 2 MG tablet TAKE 1 TABLET ONCE TO TWICE DAILY(EVERY 12 HOURS) FOR URINARY FREQUENCY. 60 tablet 5  . traMADol (ULTRAM) 50 MG tablet Take 50 mg by mouth every 8 (eight) hours as needed (pain).      No current facility-administered medications for this visit.    Allergies:   Sulfa antibiotics and Sulfasalazine    Social History:  The patient  reports that she has never smoked. She has never used smokeless tobacco. She reports that she drinks alcohol. She reports that she does not use illicit drugs.   Family History:  The patient's family history includes Colon cancer in her father; Colon polyps in her brother; Coronary artery disease in her brother; Diabetes in her brother.    ROS:  Please see the history of present illness.   Otherwise, review of systems are positive for none.   All other systems are reviewed and negative.    PHYSICAL EXAM: VS:  BP 110/68 mmHg  Pulse 65  Ht 5\' 8"  (1.727 m)  Wt 136 lb (61.689 kg)  BMI 20.68 kg/m2 , BMI Body mass index is 20.68 kg/(m^2). GEN: Well nourished, well developed, in no acute distress HEENT: normal Neck: no JVD, carotid bruits, or masses Cardiac: RRR; no murmurs, rubs, or gallops,no edema  Respiratory:  clear to auscultation bilaterally, normal work of breathing GI: soft, nontender, nondistended, + BS MS: no deformity or atrophy Skin: warm and dry, no rash Neuro:  Strength and sensation are intact Psych: euthymic mood, full affect   EKG:  EKG is ordered today. The ekg ordered today demonstrates dual-chamber pacemaker at 65 bpm   Recent Labs: 01/25/2015: ALT 12; BUN 21; Creat 0.63; Hemoglobin 12.1; Platelets 283; Potassium 4.2; Sodium 138; TSH 0.957    Lipid Panel    Component Value Date/Time   CHOL 191 10/17/2013 1119   TRIG 53.0 10/17/2013 1119   HDL  74.50 10/17/2013 1119   CHOLHDL 3 10/17/2013 1119   VLDL 10.6 10/17/2013 1119   LDLCALC 106* 10/17/2013 1119   LDLDIRECT 127.6 04/21/2013 1127      Wt Readings from Last 3 Encounters:  03/01/15 136 lb (61.689 kg)  01/25/15 139 lb (63.05 kg)  01/03/15 147 lb 14.9 oz (67.1 kg)         ASSESSMENT AND PLAN:  1. Functioning St. Jude dual-chamber pacemaker for intermittent high-grade AV block. 2. memory disorder 3.  hypercholesterolemia followed by Dr. Renold Genta 4. Hypothyroidism 5. Unintentional weight loss, improving.  Her weight is down 4 pounds since July however   Current medicines are reviewed at length with the patient today.  The patient does not have concerns regarding medicines.  The following changes have been made:  no change  Labs/ tests ordered today include:   Orders Placed This Encounter  Procedures  . EKG 12-Lead     Disposition: Continue current medication.  Recheck in 6 months for follow-up office visit.  Berna Spare MD 03/01/2015 7:04 PM    [facility] [address], [place], [place]  [address] Phone: [phone]; Fax: [phone]

## 2015-03-01 NOTE — Patient Instructions (Signed)
Medication Instructions:  Your physician recommends that you continue on your current medications as directed. Please refer to the Current Medication list given to you today.  Labwork: NONE  Testing/Procedures: NONE  Follow-Up: Your physician wants you to follow-up in: 6 MONTH OV You will receive a reminder letter in the mail two months in advance. If you don't receive a letter, please call our office to schedule the follow-up appointment.     

## 2015-03-06 ENCOUNTER — Encounter: Payer: Self-pay | Admitting: Internal Medicine

## 2015-03-13 LAB — CUP PACEART REMOTE DEVICE CHECK
Brady Statistic AP VS Percent: 4.5 %
Brady Statistic AS VP Percent: 18 %
Brady Statistic RA Percent Paced: 7.5 %
Brady Statistic RV Percent Paced: 22 %
Date Time Interrogation Session: 20160908073305
Lead Channel Pacing Threshold Amplitude: 1.25 V
Lead Channel Pacing Threshold Pulse Width: 0.5 ms
Lead Channel Sensing Intrinsic Amplitude: 12 mV
Lead Channel Setting Pacing Amplitude: 2 V
Lead Channel Setting Pacing Pulse Width: 0.5 ms
Lead Channel Setting Sensing Sensitivity: 2 mV
MDC IDC MSMT BATTERY REMAINING LONGEVITY: 120 mo
MDC IDC MSMT BATTERY REMAINING PERCENTAGE: 95.5 %
MDC IDC MSMT BATTERY VOLTAGE: 3.01 V
MDC IDC MSMT LEADCHNL RA IMPEDANCE VALUE: 410 Ohm
MDC IDC MSMT LEADCHNL RA PACING THRESHOLD AMPLITUDE: 0.75 V
MDC IDC MSMT LEADCHNL RA SENSING INTR AMPL: 3.4 mV
MDC IDC MSMT LEADCHNL RV IMPEDANCE VALUE: 430 Ohm
MDC IDC MSMT LEADCHNL RV PACING THRESHOLD PULSEWIDTH: 0.5 ms
MDC IDC PG SERIAL: 7474456
MDC IDC SET LEADCHNL RV PACING AMPLITUDE: 2.5 V
MDC IDC STAT BRADY AP VP PERCENT: 4.1 %
MDC IDC STAT BRADY AS VS PERCENT: 71 %
Pulse Gen Model: 2210

## 2015-03-15 ENCOUNTER — Other Ambulatory Visit: Payer: Self-pay | Admitting: Nurse Practitioner

## 2015-03-18 NOTE — Telephone Encounter (Signed)
Please call to make a follow up appointment. Thank you.

## 2015-04-14 ENCOUNTER — Other Ambulatory Visit: Payer: Self-pay | Admitting: Nurse Practitioner

## 2015-04-29 ENCOUNTER — Ambulatory Visit (INDEPENDENT_AMBULATORY_CARE_PROVIDER_SITE_OTHER): Payer: Medicare Other | Admitting: Internal Medicine

## 2015-04-29 ENCOUNTER — Encounter: Payer: Self-pay | Admitting: Internal Medicine

## 2015-04-29 VITALS — BP 102/54 | HR 65 | Ht 68.0 in | Wt 137.0 lb

## 2015-04-29 DIAGNOSIS — I4891 Unspecified atrial fibrillation: Secondary | ICD-10-CM | POA: Insufficient documentation

## 2015-04-29 DIAGNOSIS — I480 Paroxysmal atrial fibrillation: Secondary | ICD-10-CM | POA: Diagnosis not present

## 2015-04-29 DIAGNOSIS — Z950 Presence of cardiac pacemaker: Secondary | ICD-10-CM

## 2015-04-29 DIAGNOSIS — I442 Atrioventricular block, complete: Secondary | ICD-10-CM

## 2015-04-29 DIAGNOSIS — I459 Conduction disorder, unspecified: Secondary | ICD-10-CM

## 2015-04-29 DIAGNOSIS — E785 Hyperlipidemia, unspecified: Secondary | ICD-10-CM

## 2015-04-29 LAB — CUP PACEART INCLINIC DEVICE CHECK
Battery Remaining Longevity: 129.6
Battery Voltage: 3.01 V
Brady Statistic RA Percent Paced: 9.3 %
Brady Statistic RV Percent Paced: 21 %
Date Time Interrogation Session: 20161107201003
Implantable Lead Implant Date: 20140613
Implantable Lead Implant Date: 20140613
Implantable Lead Location: 753859
Implantable Lead Location: 753860
Lead Channel Impedance Value: 312.5 Ohm
Lead Channel Impedance Value: 400 Ohm
Lead Channel Pacing Threshold Amplitude: 0.5 V
Lead Channel Pacing Threshold Amplitude: 0.5 V
Lead Channel Pacing Threshold Amplitude: 1.25 V
Lead Channel Pacing Threshold Amplitude: 1.25 V
Lead Channel Pacing Threshold Pulse Width: 0.5 ms
Lead Channel Pacing Threshold Pulse Width: 0.5 ms
Lead Channel Pacing Threshold Pulse Width: 0.5 ms
Lead Channel Pacing Threshold Pulse Width: 0.5 ms
Lead Channel Sensing Intrinsic Amplitude: 12 mV
Lead Channel Sensing Intrinsic Amplitude: 3.5 mV
Lead Channel Setting Pacing Amplitude: 2 V
Lead Channel Setting Pacing Amplitude: 2.5 V
Lead Channel Setting Pacing Pulse Width: 0.5 ms
Lead Channel Setting Sensing Sensitivity: 2 mV
Pulse Gen Model: 2210
Pulse Gen Serial Number: 7474456

## 2015-04-29 NOTE — Progress Notes (Signed)
HPI Mrs. Dyal returns today for followup. She is a very pleasant 80 year old woman with a history of symptomatic bradycardia, secondary to sinus node dysfunction, status post permanent pacemaker insertion. She also has a history of heart block. In the interim, she has been stable. She denies chest pain. No peripheral edema. No symptomatic SVT. She has dementia and has broken a leg but denies falls. Her husband is in charge of her medications.  Allergies  Allergen Reactions  . Sulfa Antibiotics Diarrhea and Rash  . Sulfasalazine Diarrhea and Rash     Current Outpatient Prescriptions  Medication Sig Dispense Refill  . aspirin EC 325 MG EC tablet Take 1 tablet (325 mg total) by mouth 2 (two) times daily at 8 am and 10 pm. 60 tablet 0  . bisacodyl (DULCOLAX) 5 MG EC tablet Take 1 tablet (5 mg total) by mouth daily as needed for moderate constipation. 30 tablet 0  . docusate sodium (COLACE) 100 MG capsule Take 1 capsule (100 mg total) by mouth 2 (two) times daily. 10 capsule 0  . fluticasone (FLONASE) 50 MCG/ACT nasal spray Place 1 spray into both nostrils 2 (two) times daily.     Marland Kitchen guaiFENesin (MUCINEX) 600 MG 12 hr tablet Take 600 mg by mouth 2 (two) times daily as needed for cough or to loosen phlegm.    Marland Kitchen HYDROcodone-acetaminophen (NORCO/VICODIN) 5-325 MG per tablet Take 1 tablet by mouth every 6 (six) hours as needed for moderate pain.    Marland Kitchen levothyroxine (SYNTHROID, LEVOTHROID) 75 MCG tablet Take 75 mcg by mouth daily before breakfast.     . methocarbamol (ROBAXIN) 500 MG tablet Take 1 tablet (500 mg total) by mouth every 6 (six) hours as needed for muscle spasms. 30 tablet 0  . methylcellulose (ARTIFICIAL TEARS) 1 % ophthalmic solution Place 1 drop into both eyes 4 (four) times daily.     Marland Kitchen NAMENDA XR 28 MG CP24 24 hr capsule TAKE 1 CAPSULE(28 MG) BY MOUTH DAILY 30 capsule 0  . polyethylene glycol (MIRALAX / GLYCOLAX) packet Take 17 g by mouth daily as needed (constipation).    . tolterodine  (DETROL) 2 MG tablet TAKE 1 TABLET ONCE TO TWICE DAILY(EVERY 12 HOURS) FOR URINARY FREQUENCY. 60 tablet 5  . traMADol (ULTRAM) 50 MG tablet Take 50 mg by mouth every 8 (eight) hours as needed (pain).      No current facility-administered medications for this visit.     Past Medical History  Diagnosis Date  . Hyperlipidemia   . Thyroid disease   . Arthritis   . Asthma   . Depression   . Colon polyps   . Environmental allergies   . Complete heart block -intermittent     a. 11/2012 s/p SJM Accent DR RF dc ppm.  . Osteoarthritis     hands  . Memory retention disorder 03/22/2013  . Allergy     ROS:   All systems reviewed and negative except as noted in the HPI.   Past Surgical History  Procedure Laterality Date  . Knee surgery    . Salpingoophorectomy      right  . Appendectomy  1965  . Colonoscopy  2007    adenomatous polyps  . Insert / replace / remove pacemaker  12/02/2012     Dr Lovena Le  . Hand surgery    . Ovarian cystectomy  1965  . Tumor removed  1985    abdomen  . Cervical polypectomy  1996  . Permanent pacemaker insertion N/A 12/02/2012  Procedure: PERMANENT PACEMAKER INSERTION;  Surgeon: Evans Lance, MD;  Location: [facility] CATH LAB;  Service: Cardiovascular;  Laterality: N/A;  . Eye surgery    . Hip arthroplasty Right 01/03/2015    Procedure: ARTHROPLASTY BIPOLAR HIP (HEMIARTHROPLASTY);  Surgeon: [name], MD;  Location: [facility] ORS;  Service: Orthopedics;  Laterality: Right;     Family History  Problem Relation Age of Onset  . Colon cancer Father   . Colon polyps Brother   . Diabetes Brother     maternal aunt  . Coronary artery disease Brother      Social History   Social History  . Marital Status: Married    Spouse Name: [name]  . Number of Children: 3  . Years of Education: College   Occupational History  . elem. school teacher     retired   Social History Main Topics  . Smoking status: Never Smoker   . Smokeless tobacco: Never Used  .  Alcohol Use: 0.0 oz/week    0 Standard drinks or equivalent per week     Comment: occas. which is seldom  . Drug Use: No  . Sexual Activity: Not on file   Other Topics Concern  . Not on file   Social History Narrative   Patient has one daughter, two adopted children(son,daughter).   Patient lives at home with spouse [name]).   Patient is retired.   Patient has a college education.   Patient is right-handed.   Patient drinks very little caffeine.     BP 102/54 mmHg  Pulse 65  Ht 5\' 8"  (1.727 m)  Wt 137 lb (62.143 kg)  BMI 20.84 kg/m2  SpO2 97%  Physical Exam:  Well appearing 80 year old woman, NAD HEENT: Unremarkable Neck:  6 cm JVD, no thyromegally Back:  No CVA tenderness Lungs:  Clear with no wheezes, rales, or rhonchi. Well healed PPM incision HEART:  Regular rate rhythm, no murmurs, no rubs, no clicks Abd:  soft, positive bowel sounds, no organomegally, no rebound, no guarding Ext:  2 plus pulses, no edema, no cyanosis, no clubbing Skin:  No rashes no nodules Neuro:  CN II through XII intact, motor grossly intact   DEVICE  Normal device function.  See PaceArt for details.   Assess/Plan:

## 2015-04-29 NOTE — Assessment & Plan Note (Signed)
She will maintain a low fat diet.  

## 2015-04-29 NOTE — Assessment & Plan Note (Signed)
Her [employer] DDD PM is working normally. Will recheck in several months.  

## 2015-04-29 NOTE — Assessment & Plan Note (Signed)
She has had no recurrences since her PPM was placed. Will follow.

## 2015-04-29 NOTE — Patient Instructions (Addendum)
Medication Instructions:  Your physician recommends that you continue on your current medications as directed. Please refer to the Current Medication list given to you today.  Labwork: None  ordered  Testing/Procedures: None ordered  Follow-Up: Remote monitoring is used to monitor your Pacemaker of ICD from home. This monitoring reduces the number of office visits required to check your device to one time per year. It allows [place] to keep an eye on the functioning of your device to ensure it is working properly. You are scheduled for a device check from home on 07/29/15. You may send your transmission at any time that day. If you have a wireless device, the transmission will be sent automatically. After your physician reviews your transmission, you will receive a postcard with your next transmission date.  Your physician wants you to follow-up in: 1 year with Dr. Lovena Le. You will receive a reminder letter in the mail two months in advance. If you don't receive a letter, please call our office to schedule the follow-up appointment.  Any Other Special Instructions Will Be Listed Below (If Applicable).  If you need a refill on your cardiac medications before your next appointment, please call your pharmacy.  Thank you for choosing [facility]!!

## 2015-04-29 NOTE — Assessment & Plan Note (Signed)
This is a new problem. She is asymptomatic and her PPM interrogation demonstrates she is out of rhythm less than 1% of the time. I would like to confer whether or not she is a candidate for systemic anti-coagulation with her primary cardiologist Dr. Mare Ferrari.

## 2015-05-01 ENCOUNTER — Telehealth: Payer: Self-pay | Admitting: *Deleted

## 2015-05-01 NOTE — Telephone Encounter (Signed)
Patient has atrial fib on her PPM interogation for which she is asymptomatic. She is out of rhythm less than 1% of the time but is still at risk for stroke.   Above per Dr Lovena Le.  Spoke with husband secondary to patients dementia.  He would prefer holding off on anticoagulation therapy at this time.  He will call back if anything changes and he would like for her to start.

## 2015-05-02 NOTE — Telephone Encounter (Signed)
Thanks for the followup!  

## 2015-05-13 ENCOUNTER — Other Ambulatory Visit: Payer: Self-pay | Admitting: Adult Health

## 2015-06-26 ENCOUNTER — Telehealth: Payer: Self-pay | Admitting: *Deleted

## 2015-06-26 DIAGNOSIS — E785 Hyperlipidemia, unspecified: Secondary | ICD-10-CM

## 2015-06-26 DIAGNOSIS — I480 Paroxysmal atrial fibrillation: Secondary | ICD-10-CM

## 2015-06-26 NOTE — Telephone Encounter (Signed)
Spoke with husband and moved appointment up to discuss recent Afib seen on pacer check

## 2015-07-10 ENCOUNTER — Ambulatory Visit (INDEPENDENT_AMBULATORY_CARE_PROVIDER_SITE_OTHER): Payer: Medicare Other | Admitting: Cardiology

## 2015-07-10 ENCOUNTER — Encounter: Payer: Self-pay | Admitting: Cardiology

## 2015-07-10 VITALS — BP 110/70 | HR 81 | Ht 68.0 in | Wt 145.8 lb

## 2015-07-10 DIAGNOSIS — I480 Paroxysmal atrial fibrillation: Secondary | ICD-10-CM

## 2015-07-10 DIAGNOSIS — R413 Other amnesia: Secondary | ICD-10-CM

## 2015-07-10 DIAGNOSIS — I442 Atrioventricular block, complete: Secondary | ICD-10-CM

## 2015-07-10 MED ORDER — APIXABAN 5 MG PO TABS: 5.0000 mg | ORAL_TABLET | Freq: Two times a day (BID) | ORAL | Status: DC

## 2015-07-10 NOTE — Patient Instructions (Signed)
Medication Instructions:  START ELIQUIS 5 MG TWICE A DAY   STOP ASPIRIN   Labwork: NONE  Testing/Procedures: NONE  Follow-Up: Your physician wants you to follow-up in: 6 MONTH OV/EKG/BMET/CBC WITH DR Sayre will receive a reminder letter in the mail two months in advance. If you don't receive a letter, please call our office to schedule the follow-up appointment.  If you need a refill on your cardiac medications before your next appointment, please call your pharmacy.

## 2015-07-10 NOTE — Progress Notes (Signed)
Cardiology Office Note   Date:  07/10/2015   ID:  [name], DOB 01/26/1935, MRN [id number]  PCP:  Elby Showers, MD  Cardiologist: Darlin Coco MD  Chief Complaint  Patient presents with  . routine follow up    denies chest pain, shortness of breath, le edema, any claudication      History of Present Illness: [name] is a 81 y.o. female who presents for a six-month follow-up visit  This 81 year old woman is seen for a scheduled followup office visit. She has a history of intermittent high-grade AV block and has a functioning St. Jude dual-chamber pacemaker. She is followed for this by Dr. Lovena Le. She has a history of hypercholesterolemia. She has a history of osteoarthritis. She has also had some memory issues and is followed by Dr. Brett Fairy. Since last visit she has had no new cardiac symptoms. She has not had any dizziness or syncope. No chest pain. She has had a past history of unintentional weight loss but since her last visit she has had no change in weight. The patient had a chest x-ray on 02/19/14 which showed no abnormality. She complains of feeling tired and worn out. She has had extensive lab work by her PCP which did not show any evidence of malignancy. She had a CT scan of the abdomen and pelvis on 08/08/14 which did not show any abdominal aortic aneurysm or other cause of weight loss. She complains of dyspnea and not being able to get a complete breath. Impression The patient developed a fractured right hip. Her husband and she notes that she never fell. Her hip just started to hurt. X-rays confirmed a fracture and Dr. Rhona Raider operated on her on 01/03/15. She is walking with a cane. Her recent pacemaker interrogation revealed that she was having episodes of paroxysmal atrial fibrillation 1% of the time.  This does put her at increased risk for thromboembolic disease.  Past Medical History  Diagnosis Date  . Hyperlipidemia   . Thyroid disease   .  Arthritis   . Asthma   . Depression   . Colon polyps   . Environmental allergies   . Complete heart block -intermittent     a. 11/2012 s/p SJM Accent DR RF dc ppm.  . Osteoarthritis     hands  . Memory retention disorder 03/22/2013  . Allergy     Past Surgical History  Procedure Laterality Date  . Knee surgery    . Salpingoophorectomy      right  . Appendectomy  1965  . Colonoscopy  2007    adenomatous polyps  . Insert / replace / remove pacemaker  12/02/2012     Dr Lovena Le  . Hand surgery    . Ovarian cystectomy  1965  . Tumor removed  1985    abdomen  . Cervical polypectomy  1996  . Permanent pacemaker insertion N/A 12/02/2012    Procedure: PERMANENT PACEMAKER INSERTION;  Surgeon: Evans Lance, MD;  Location: [facility] CATH LAB;  Service: Cardiovascular;  Laterality: N/A;  . Eye surgery    . Hip arthroplasty Right 01/03/2015    Procedure: ARTHROPLASTY BIPOLAR HIP (HEMIARTHROPLASTY);  Surgeon: [name], MD;  Location: [facility] ORS;  Service: Orthopedics;  Laterality: Right;     Current Outpatient Prescriptions  Medication Sig Dispense Refill  . apixaban (ELIQUIS) 5 MG TABS tablet Take 1 tablet (5 mg total) by mouth 2 (two) times daily. 60 tablet 11  . bisacodyl (DULCOLAX) 5 MG EC  tablet Take 1 tablet (5 mg total) by mouth daily as needed for moderate constipation. 30 tablet 0  . docusate sodium (COLACE) 100 MG capsule Take 1 capsule (100 mg total) by mouth 2 (two) times daily. 10 capsule 0  . fluticasone (FLONASE) 50 MCG/ACT nasal spray Place 1 spray into both nostrils 2 (two) times daily.     Marland Kitchen guaiFENesin (MUCINEX) 600 MG 12 hr tablet Take 600 mg by mouth 2 (two) times daily as needed for cough or to loosen phlegm.    Marland Kitchen HYDROcodone-acetaminophen (NORCO/VICODIN) 5-325 MG per tablet Take 1 tablet by mouth every 6 (six) hours as needed for moderate pain.    Marland Kitchen levothyroxine (SYNTHROID, LEVOTHROID) 75 MCG tablet Take 75 mcg by mouth daily before breakfast.     . methocarbamol  (ROBAXIN) 500 MG tablet Take 1 tablet (500 mg total) by mouth every 6 (six) hours as needed for muscle spasms. 30 tablet 0  . methylcellulose (ARTIFICIAL TEARS) 1 % ophthalmic solution Place 1 drop into both eyes 4 (four) times daily.     Marland Kitchen NAMENDA XR 28 MG CP24 24 hr capsule TAKE ONE CAPSULE BY MOUTH EVERY DAY.NEEDS APPOINTMENT. 30 capsule 0  . polyethylene glycol (MIRALAX / GLYCOLAX) packet Take 17 g by mouth daily as needed (constipation).    . tolterodine (DETROL) 2 MG tablet TAKE 1 TABLET ONCE TO TWICE DAILY(EVERY 12 HOURS) FOR URINARY FREQUENCY. 60 tablet 5  . traMADol (ULTRAM) 50 MG tablet Take 50 mg by mouth every 8 (eight) hours as needed (pain).      No current facility-administered medications for this visit.    Allergies:   Sulfa antibiotics and Sulfasalazine    Social History:  The patient  reports that she has never smoked. She has never used smokeless tobacco. She reports that she drinks alcohol. She reports that she does not use illicit drugs.   Family History:  The patient's family history includes Colon cancer in her father; Colon polyps in her brother; Coronary artery disease in her brother; Diabetes in her brother.    ROS:  Please see the history of present illness.   Otherwise, review of systems are positive for none.   All other systems are reviewed and negative.    PHYSICAL EXAM: VS:  BP 110/70 mmHg  Pulse 81  Ht 5\' 8"  (1.727 m)  Wt 145 lb 12.8 oz (66.134 kg)  BMI 22.17 kg/m2 , BMI Body mass index is 22.17 kg/(m^2). GEN: Well nourished, well developed, in no acute distress HEENT: normal Neck: no JVD, carotid bruits, or masses Cardiac: RRR; no murmurs, rubs, or gallops,no edema  Respiratory:  clear to auscultation bilaterally, normal work of breathing GI: soft, nontender, nondistended, + BS MS: no deformity or atrophy Skin: warm and dry, no rash Neuro:  Strength and sensation are intact Psych: euthymic mood, full affect   EKG:  EKG is not ordered  today.    Recent Labs: 01/25/2015: ALT 12; BUN 21; Creat 0.63; Hemoglobin 12.1; Platelets 283; Potassium 4.2; Sodium 138; TSH 0.957    Lipid Panel    Component Value Date/Time   CHOL 191 10/17/2013 1119   TRIG 53.0 10/17/2013 1119   HDL 74.50 10/17/2013 1119   CHOLHDL 3 10/17/2013 1119   VLDL 10.6 10/17/2013 1119   LDLCALC 106* 10/17/2013 1119   LDLDIRECT 127.6 04/21/2013 1127      Wt Readings from Last 3 Encounters:  07/10/15 145 lb 12.8 oz (66.134 kg)  04/29/15 137 lb (62.143 kg)  03/01/15  136 lb (61.689 kg)       ASSESSMENT AND PLAN:  1. Functioning St. Jude dual-chamber pacemaker for intermittent high-grade AV block. 2. memory disorder 3. hypercholesterolemia followed by Dr. Renold Genta 4. Hypothyroidism 5. Unintentional weight loss, improving. Her weight is down 4 pounds since July however 6.  Paroxysmal atrial fibrillation. This patients CHA2DS2-VASc Score is 3 for age and female sex.  Above score calculated as 1 point each if present [CHF, HTN, DM, Vascular=MI/PAD/Aortic Plaque, Age if 65-74, or Female] Above score calculated as 2 points each if present [Age > 75, or Stroke/TIA/TE]   Current medicines are reviewed at length with the patient today.  The patient does not have concerns regarding medicines.  The following changes have been made:  We discussed the risks and benefits of anticoagulation.  She does not have any history of blood loss anemia.  We will start her on Apixaban 5 mg twice a day.  This is the proper dose for her.  She weighs 66 kg and age is less than 45.  Renal function has been normal in the past.  Labs/ tests ordered today include:   Orders Placed This Encounter  Procedures  . Basic metabolic panel  . CBC with Differential/Platelet    Disposition: Start Apixaban 5 mg twice a day.  Stop baby aspirin.  Recheck in 6 months for office visit EKG and basal metabolic panel and CBC.  She will follow-up with Dr. Acie Fredrickson.  Berna Spare  MD 07/10/2015 6:36 PM    [facility] [address], [place], [place]  [address] Phone: [phone]; Fax: [phone]

## 2015-07-11 ENCOUNTER — Telehealth: Payer: Self-pay

## 2015-07-11 ENCOUNTER — Other Ambulatory Visit: Payer: Self-pay | Admitting: Adult Health

## 2015-07-11 NOTE — Telephone Encounter (Signed)
Prior auth for Eliquis 5 mg sent to [employer].

## 2015-07-12 ENCOUNTER — Telehealth: Payer: Self-pay

## 2015-07-12 NOTE — Telephone Encounter (Signed)
Eliquis 5 mg approved through 06/21/2016. PA -ID:[id number].

## 2015-07-29 ENCOUNTER — Ambulatory Visit (INDEPENDENT_AMBULATORY_CARE_PROVIDER_SITE_OTHER): Payer: Medicare Other | Admitting: *Deleted

## 2015-07-29 DIAGNOSIS — I442 Atrioventricular block, complete: Secondary | ICD-10-CM | POA: Diagnosis not present

## 2015-07-30 NOTE — Progress Notes (Signed)
Remote pacemaker transmission.   

## 2015-07-31 ENCOUNTER — Ambulatory Visit: Payer: Medicare Other | Admitting: Cardiology

## 2015-08-15 ENCOUNTER — Ambulatory Visit (INDEPENDENT_AMBULATORY_CARE_PROVIDER_SITE_OTHER): Payer: Medicare Other | Admitting: Physician Assistant

## 2015-08-15 VITALS — BP 122/60 | HR 65 | Temp 97.5°F | Resp 16 | Ht 68.0 in | Wt 143.8 lb

## 2015-08-15 DIAGNOSIS — B029 Zoster without complications: Secondary | ICD-10-CM | POA: Diagnosis not present

## 2015-08-15 MED ORDER — VALACYCLOVIR HCL 1 G PO TABS: 1000.0000 mg | ORAL_TABLET | Freq: Three times a day (TID) | ORAL | Status: DC

## 2015-08-15 NOTE — Progress Notes (Signed)
   08/15/2015 2:26 PM   DOB: 1934-10-11 / MRN: [id number]  SUBJECTIVE:  [name] is a 81 y.o. female presenting for a painless rash on her left hip that started a week ago. She has had the shingles shot. Reports she was having some skin tenderness of the left lower abdomen, but this abated two days ago.   She is allergic to sulfa antibiotics and sulfasalazine.   She  has a past medical history of Hyperlipidemia; Thyroid disease; Arthritis; Asthma; Depression; Colon polyps; Environmental allergies; Complete heart block -intermittent; Osteoarthritis; Memory retention disorder (03/22/2013); and Allergy.    She  reports that she has never smoked. She has never used smokeless tobacco. She reports that she drinks alcohol. She reports that she does not use illicit drugs. She  has no sexual activity history on file. The patient  has past surgical history that includes Knee surgery; Salpingoophorectomy; Appendectomy (1965); Colonoscopy (2007); Insert / replace / remove pacemaker (12/02/2012 ); Hand surgery; ovarian cystectomy [id number]); tumor removed (1985); Cervical polypectomy (1996); permanent pacemaker insertion (N/A, 12/02/2012); Eye surgery; and Hip Arthroplasty (Right, 01/03/2015).  Her family history includes Colon cancer in her father; Colon polyps in her brother; Coronary artery disease in her brother; Diabetes in her brother.  Review of Systems  Constitutional: Negative for fever.  Respiratory: Negative for cough.   Cardiovascular: Negative for chest pain.  Genitourinary: Negative for dysuria.  Skin: Positive for rash. Negative for itching.  Neurological: Negative for dizziness and headaches.    Problem list and medications reviewed and updated by myself where necessary, and exist elsewhere in the encounter.   OBJECTIVE:  BP 122/60 mmHg  Pulse 65  Temp(Src) 97.5 F (36.4 C) (Oral)  Resp 16  Ht 5\' 8"  (1.727 m)  Wt 143 lb 12.8 oz (65.227 kg)  BMI 21.87 kg/m2  SpO2 97%  Physical Exam    Constitutional: Vital signs are normal.        No results found for this or any previous visit (from the past 72 hour(s)).  No results found.  ASSESSMENT AND PLAN  [name] was seen today for rash.  Diagnoses and all orders for this visit:  Shingles: Rash consistent in appearance however she is having no pain.  Variant presentation likely secondary to previous immunization.  Will treat.   -     valACYclovir (VALTREX) 1000 MG tablet; Take 1 tablet (1,000 mg total) by mouth 3 (three) times daily.    The patient was advised to call or return to clinic if she does not see an improvement in symptoms or to seek the care of the closest emergency department if she worsens with the above plan.   Philis Fendt, MHS, PA-C [facility] 08/15/2015 2:26 PM

## 2015-08-21 ENCOUNTER — Telehealth: Payer: Self-pay | Admitting: Adult Health

## 2015-08-21 ENCOUNTER — Other Ambulatory Visit: Payer: Self-pay | Admitting: *Deleted

## 2015-08-21 NOTE — Telephone Encounter (Signed)
Caryl Pina with [facility] is calling to get a refill for NAMENDA XR 28 MG CP24 24 hr capsule for the patient. She had problems faxing the request. Thank you.

## 2015-08-21 NOTE — Telephone Encounter (Signed)
I called pt and made appt with her tomorrow at 1330 CM/NP.  Will refill then.

## 2015-08-21 NOTE — Telephone Encounter (Signed)
I spoke to Green at [facility] and let her know that I received message and we will be seeing pt tomorrow and will refill then.

## 2015-08-22 ENCOUNTER — Encounter: Payer: Self-pay | Admitting: Nurse Practitioner

## 2015-08-22 ENCOUNTER — Ambulatory Visit (INDEPENDENT_AMBULATORY_CARE_PROVIDER_SITE_OTHER): Payer: Medicare Other | Admitting: Nurse Practitioner

## 2015-08-22 VITALS — BP 100/58 | HR 83 | Ht 68.0 in | Wt 140.0 lb

## 2015-08-22 DIAGNOSIS — E785 Hyperlipidemia, unspecified: Secondary | ICD-10-CM | POA: Diagnosis not present

## 2015-08-22 DIAGNOSIS — R269 Unspecified abnormalities of gait and mobility: Secondary | ICD-10-CM | POA: Diagnosis not present

## 2015-08-22 DIAGNOSIS — F068 Other specified mental disorders due to known physiological condition: Secondary | ICD-10-CM

## 2015-08-22 DIAGNOSIS — F0390 Unspecified dementia without behavioral disturbance: Secondary | ICD-10-CM

## 2015-08-22 MED ORDER — MEMANTINE HCL ER 28 MG PO CP24: 28.0000 mg | ORAL_CAPSULE | Freq: Every day | ORAL | Status: DC

## 2015-08-22 NOTE — Patient Instructions (Signed)
Continue Namenda at current dose will refill RX to -husband  exercise  Memory   follow-up in 6 months next visit with Dr. Brett Fairy

## 2015-08-22 NOTE — Progress Notes (Signed)
[facility]  PATIENT: [name] DOB: 11/26/1934   REASON FOR VISIT: follow up for memory loss HISTORY FROM:patient and husband    HISTORY OF PRESENT ILLNESS:[name], 81 year old female returns for follow-up. When last seen she had stopped her Aricept but restarted it after the visit. Her husband then called into the office and requested a different medication for her memory and Dr. Brett Fairy ordered [place]. She says she exercises by walking several times a week, she ambulates with a cane she denies any recent falls. She no longer cooks, she is able to perform her activities of daily living such as dressing bathing etc. Husband does the finances. She is just getting over the shingles.She returns for reevaluation and refills   07/27/14 CM [name], 81 year old female returns for followup. She was last seen by Dr. Brett Fairy 01/30/2014. At that time her Mini-Mental Status exam score had changed significantly from 28 to 23. EEG in the past normal. Dementia labs return normal. Ct of the head without change from last in Jan 2011. She was taking Aricept 23 mg daily until 2 weeks ago when she read an article in the newspaper that there was a better drug on the market. She does not remember what that drug was. She claims she exercises by walking every day. She does minimal cooking she and her husband eat out a lot. She is able to perform her activities of daily living such as dressing bathing etc. She does not do the finances. She lives with her husband. She does not drive. She denies any recent falls    HISTORY: CD She is a homemaker , but taught school, adopted 2 children and then had a biological daughter.Marland Kitchen Her husband is retired , neither partner smokes or drinks, the adult children live in other states. The patient has a history of depression, hypothyroidism, possible pre-diabetes. Dr. Renold Genta is concerned about possible early dementia. The patient also states that she has a  pacemaker has a history of a first degree A V. block. She'll has a history of dizziness and possible TIA and is an established patient with Dr. Leonie Man at [facility].  She had transient left-sided weakness when he saw her in 2011. In March 2011, she underwent an MRI of the brain ( ordering physician was Dr. Brett Fairy) with no evidence of tumor or infarct or bleed. Evident was chronic microvascular ischemia ,but this was considered age related ( Dr Leonie Man interpreted) .  The evaluation for possible stroke or TIA in March 2011 followed a history of a sudden fall within the confines of her home. She had walked from the living room to the kitchen and fell between the coffee table and the sofa. Apparently, there was a second episode when she fell suddenly to the right. The second episode was associated with numbness of the left side. 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 A cardiac monitoring revealed AV block as the possible cause for her falls. She has seen Dr. Crissie Sickles twice 2011 year for syncope with a sick sinus syndrome ( but states she" hadn't fainted " ). She has a Diplomatic Services operational officer since ? (" i don't know when- its been a while " )  Meanwhile the patient feels that her memory has never been that much better and that in all functions have been taking away from her- she never needed to keep the financial books.  She feels, that she is performrng at  the same level as last year or 2 years ago.  Extensive laboratory results were already performed by her primary care physician and reviewed today, she has no problems with sleep, she has no nocturnal hallucinations she reports normal light terrors or REM behavior disorder, she also had no recent falls. There is no parkinsonism, shuffling gait, urinary incontinence.     REVIEW OF SYSTEMS: Full 14 system review of systems performed and notable only for those listed, all others are neg:    Constitutional: neg  Cardiovascular: neg Ear/Nose/Throat: neg  Skin: neg Eyes: neg Respiratory: neg Gastroitestinal: urinary frequency  Hematology/Lymphatic: neg  Endocrine: neg Musculoskeletal:neg Allergy/Immunology: neg Neurological: memory loss Psychiatric: neg Sleep : neg   ALLERGIES: Allergies  Allergen Reactions  . Sulfa Antibiotics Diarrhea and Rash  . Sulfasalazine Diarrhea and Rash    HOME MEDICATIONS: Outpatient Prescriptions Prior to Visit  Medication Sig Dispense Refill  . apixaban (ELIQUIS) 5 MG TABS tablet Take 1 tablet (5 mg total) by mouth 2 (two) times daily. 60 tablet 11  . HYDROcodone-acetaminophen (NORCO/VICODIN) 5-325 MG per tablet Take 1 tablet by mouth every 6 (six) hours as needed for moderate pain. Reported on 08/15/2015    . levothyroxine (SYNTHROID, LEVOTHROID) 75 MCG tablet Take 75 mcg by mouth daily before breakfast.     . methylcellulose (ARTIFICIAL TEARS) 1 % ophthalmic solution Place 1 drop into both eyes 4 (four) times daily.     Marland Kitchen NAMENDA XR 28 MG CP24 24 hr capsule TAKE 1 CAPSULE BY MOUTH EVERY DAY 30 capsule 0  . traMADol (ULTRAM) 50 MG tablet Take 50 mg by mouth every 8 (eight) hours as needed (pain). Reported on 08/15/2015    . valACYclovir (VALTREX) 1000 MG tablet Take 1 tablet (1,000 mg total) by mouth 3 (three) times daily. 21 tablet 0  . fluticasone (FLONASE) 50 MCG/ACT nasal spray Place 1 spray into both nostrils 2 (two) times daily as needed. Reported on 08/22/2015    . bisacodyl (DULCOLAX) 5 MG EC tablet Take 1 tablet (5 mg total) by mouth daily as needed for moderate constipation. (Patient not taking: Reported on 08/15/2015) 30 tablet 0  . docusate sodium (COLACE) 100 MG capsule Take 1 capsule (100 mg total) by mouth 2 (two) times daily. (Patient not taking: Reported on 08/15/2015) 10 capsule 0  . guaiFENesin (MUCINEX) 600 MG 12 hr tablet Take 600 mg by mouth 2 (two) times daily as needed for cough or to loosen phlegm. Reported on 08/22/2015     . methocarbamol (ROBAXIN) 500 MG tablet Take 1 tablet (500 mg total) by mouth every 6 (six) hours as needed for muscle spasms. (Patient not taking: Reported on 08/15/2015) 30 tablet 0  . polyethylene glycol (MIRALAX / GLYCOLAX) packet Take 17 g by mouth daily as needed (constipation). Reported on 08/22/2015    . tolterodine (DETROL) 2 MG tablet TAKE 1 TABLET ONCE TO TWICE DAILY(EVERY 12 HOURS) FOR URINARY FREQUENCY. (Patient not taking: Reported on 08/15/2015) 60 tablet 5   No facility-administered medications prior to visit.    PAST MEDICAL HISTORY: Past Medical History  Diagnosis Date  . Hyperlipidemia   . Thyroid disease   . Arthritis   . Asthma   . Depression   . Colon polyps   . Environmental allergies   . Complete heart block -intermittent     a. 11/2012 s/p SJM Accent DR RF dc ppm.  . Osteoarthritis     hands  . Memory retention disorder 03/22/2013  . Allergy  PAST SURGICAL HISTORY: Past Surgical History  Procedure Laterality Date  . Knee surgery    . Salpingoophorectomy      right  . Appendectomy  1965  . Colonoscopy  2007    adenomatous polyps  . Insert / replace / remove pacemaker  12/02/2012     Dr Lovena Le  . Hand surgery    . Ovarian cystectomy  1965  . Tumor removed  1985    abdomen  . Cervical polypectomy  1996  . Permanent pacemaker insertion N/A 12/02/2012    Procedure: PERMANENT PACEMAKER INSERTION;  Surgeon: Evans Lance, MD;  [facility]: [facility] C[facility] LAB;  Service: Cardiovascular;  Laterality: N/A;  . Eye surgery    . Hip arthroplasty Right 01/03/2015    Procedure: ARTHROPLASTY BIPOLAR HIP (HEMIARTHROPLASTY);  Surgeon: [name], MD;  Location: [facility] ORS;  Service: Orthopedics;  Laterality: Right;    FAMILY HISTORY: Family History  Problem Relation Age of Onset  . Colon cancer Father   . Colon polyps Brother   . Diabetes Brother     maternal aunt  . Coronary artery disease Brother     SOCIAL HISTORY: Social History   Social History  . Marital  Status: Married    Spouse Name: [name]  . Number of Children: 3  . Years of Education: College   Occupational History  . elem. school teacher     retired   Social History Main Topics  . Smoking status: Never Smoker   . Smokeless tobacco: Never Used  . Alcohol Use: 0.0 oz/week    0 Standard drinks or equivalent per week     Comment: occas. which is seldom  . Drug Use: No  . Sexual Activity: Not on file   Other Topics Concern  . Not on file   Social History Narrative   Patient has one daughter, two adopted children(son,daughter).   Patient lives at home with spouse [name]).   Patient is retired.   Patient has a college education.   Patient is right-handed.   Patient drinks very little caffeine.     PHYSICAL EXAM  Filed Vitals:   08/22/15 1325  BP: 100/58  Pulse: 83  Height: 5\' 8"  (1.727 m)  Weight: 140 lb (63.504 kg)   Body mass index is 21.29 kg/(m^2). Generalized: Well developed, in no acute distress , well groomed Head: normocephalic and atraumatic,. Oropharynx benign  Neck: Supple Cardiac: Regular rate rhythm Musculoskeletal: No deformity   Neurological examination   Mentation: Alert oriented to time, place, history taking. MMSE 24/30 missing items in orientation, 3/3 recall.  AFT 6Clock drawing 4/4.  Attention span and concentration appropriate. Recent and remote memory intact. Follows all commands speech and language fluent.   Cranial nerve II-XII: Fundoscopic exam deferred .Pupils were equal round reactive to light extraocular movements were full, visual field were full on confrontational test. Facial sensation and strength were normal. hearing was intact to finger rubbing bilaterally. Uvula tongue midline. head turning and shoulder shrug were normal and symmetric.Tongue protrusion into cheek strength was normal. Motor: normal bulk and tone, full strength in the BUE, BLE, fine finger movements normal, no pronator drift. No focal weakness Sensory:  normal and symmetric to light touch, pinprick, and Vibration, proprioception  Coordination: finger-nose-finger, heel-to-shin bilaterally, no dysmetria Reflexes: Brachioradialis 2/2, biceps 2/2, triceps 2/2, patellar 2/2, Achilles 2/2, plantar responses were flexor bilaterally. Gait and Station: Rising up from seated position without assistance, normal stance, moderate stride, good arm swing, smooth turning, able to perform tiptoe,  and heel walking without difficulty. Tandem gait is steady. Ambulates with a single-point cane  DIAGNOSTIC DATA (LABS, IMAGING, TESTING) - ASSESSMENT AND PLAN  81 y.o. year old female  has a past medical history of Hyperlipidemia;  Memory retention disorder (03/22/2013); and Allergy. Here To follow-up   PLAN: Continue Namenda extended release 28 mg daily prescription to husband per request Exercise for overall health and well being Exercise the brain such as word games cross words strategies etc. Create a safe environment, remove locks on bathroom  Doors Reduced confusion, keep familiar objects and people around, stick to a routine Use effective communication such as simple words and short sentences Reduce nighttime restlessness, a consistent nighttime routine,  avoid napping during the day Encourage good nutrition and hydration Follow-up in 6 months next visit with Dr. Brett Fairy May be a candidate for CREAD will check with [place], [facility], [facility], [facility] [address] [place], [place] [address] [phone]

## 2015-08-23 NOTE — Progress Notes (Signed)
I agree with the assessment and plan as directed by NP .The patient is known to me .   Meara Wiechman, MD  

## 2015-08-26 ENCOUNTER — Telehealth: Payer: Self-pay

## 2015-08-26 NOTE — Telephone Encounter (Signed)
[name]

## 2015-08-26 NOTE — Telephone Encounter (Signed)
Pt has completed her meds for shingles and sores are drying up -should she get a refill on valtex   Best number [phone] ok to leave a message

## 2015-08-27 NOTE — Telephone Encounter (Signed)
No need. Please make her aware that further treatment is not necessary. Philis Fendt, MS, PA-C 10:35 PM, 08/27/2015

## 2015-08-28 NOTE — Telephone Encounter (Signed)
Left message to advise no furthur treatment.

## 2015-08-30 LAB — CUP PACEART REMOTE DEVICE CHECK
Battery Remaining Longevity: 114 mo
Battery Remaining Percentage: 95.5 %
Brady Statistic AP VS Percent: 11 %
Brady Statistic AS VS Percent: 58 %
Brady Statistic RV Percent Paced: 28 %
Implantable Lead Implant Date: 20140613
Implantable Lead Location: 753859
Implantable Lead Location: 753860
Lead Channel Impedance Value: 360 Ohm
Lead Channel Pacing Threshold Amplitude: 0.5 V
Lead Channel Pacing Threshold Pulse Width: 0.5 ms
Lead Channel Sensing Intrinsic Amplitude: 12 mV
Lead Channel Setting Pacing Amplitude: 2 V
Lead Channel Setting Sensing Sensitivity: 2 mV
MDC IDC LEAD IMPLANT DT: 20140613
MDC IDC MSMT BATTERY VOLTAGE: 3.01 V
MDC IDC MSMT LEADCHNL RA IMPEDANCE VALUE: 410 Ohm
MDC IDC MSMT LEADCHNL RA SENSING INTR AMPL: 5 mV
MDC IDC MSMT LEADCHNL RV PACING THRESHOLD AMPLITUDE: 1.25 V
MDC IDC MSMT LEADCHNL RV PACING THRESHOLD PULSEWIDTH: 0.5 ms
MDC IDC PG SERIAL: 7474456
MDC IDC SESS DTM: 20170206082711
MDC IDC SET LEADCHNL RV PACING AMPLITUDE: 2.5 V
MDC IDC SET LEADCHNL RV PACING PULSEWIDTH: 0.5 ms
MDC IDC STAT BRADY AP VP PERCENT: 9.8 %
MDC IDC STAT BRADY AS VP PERCENT: 18 %
MDC IDC STAT BRADY RA PERCENT PACED: 19 %

## 2015-09-04 ENCOUNTER — Encounter: Payer: Self-pay | Admitting: Cardiology

## 2015-10-28 ENCOUNTER — Telehealth: Payer: Self-pay | Admitting: Cardiology

## 2015-10-28 ENCOUNTER — Encounter: Payer: Medicare Other | Admitting: *Deleted

## 2015-10-28 NOTE — Telephone Encounter (Signed)
LMOVM reminding pt to send remote transmission.   

## 2015-11-01 ENCOUNTER — Encounter: Payer: Self-pay | Admitting: Cardiology

## 2015-11-21 ENCOUNTER — Ambulatory Visit (INDEPENDENT_AMBULATORY_CARE_PROVIDER_SITE_OTHER): Payer: Medicare Other | Admitting: *Deleted

## 2015-11-21 DIAGNOSIS — I442 Atrioventricular block, complete: Secondary | ICD-10-CM

## 2015-11-25 NOTE — Progress Notes (Signed)
Remote pacemaker transmission.   

## 2015-12-06 LAB — CUP PACEART REMOTE DEVICE CHECK
Brady Statistic AP VP Percent: 8.1 %
Brady Statistic AS VP Percent: 19 %
Brady Statistic RA Percent Paced: 16 %
Brady Statistic RV Percent Paced: 27 %
Date Time Interrogation Session: 20170601050416
Implantable Lead Location: 753859
Implantable Lead Location: 753860
Lead Channel Impedance Value: 380 Ohm
Lead Channel Pacing Threshold Pulse Width: 0.5 ms
Lead Channel Sensing Intrinsic Amplitude: 12 mV
Lead Channel Setting Pacing Amplitude: 2 V
MDC IDC LEAD IMPLANT DT: 20140613
MDC IDC LEAD IMPLANT DT: 20140613
MDC IDC MSMT BATTERY REMAINING LONGEVITY: 115 mo
MDC IDC MSMT BATTERY REMAINING PERCENTAGE: 95.5 %
MDC IDC MSMT BATTERY VOLTAGE: 2.99 V
MDC IDC MSMT LEADCHNL RA IMPEDANCE VALUE: 410 Ohm
MDC IDC MSMT LEADCHNL RA PACING THRESHOLD AMPLITUDE: 0.5 V
MDC IDC MSMT LEADCHNL RA SENSING INTR AMPL: 4 mV
MDC IDC MSMT LEADCHNL RV PACING THRESHOLD AMPLITUDE: 1.25 V
MDC IDC MSMT LEADCHNL RV PACING THRESHOLD PULSEWIDTH: 0.5 ms
MDC IDC SET LEADCHNL RV PACING AMPLITUDE: 2.5 V
MDC IDC SET LEADCHNL RV PACING PULSEWIDTH: 0.5 ms
MDC IDC SET LEADCHNL RV SENSING SENSITIVITY: 2 mV
MDC IDC STAT BRADY AP VS PERCENT: 9 %
MDC IDC STAT BRADY AS VS PERCENT: 61 %
Pulse Gen Model: 2210
Pulse Gen Serial Number: 7474456

## 2015-12-11 ENCOUNTER — Encounter: Payer: Self-pay | Admitting: Cardiology

## 2016-01-07 ENCOUNTER — Ambulatory Visit (INDEPENDENT_AMBULATORY_CARE_PROVIDER_SITE_OTHER): Payer: Medicare Other | Admitting: Cardiovascular Disease

## 2016-01-07 ENCOUNTER — Encounter: Payer: Self-pay | Admitting: Cardiovascular Disease

## 2016-01-07 VITALS — BP 98/60 | HR 74 | Ht 68.0 in | Wt 131.8 lb

## 2016-01-07 DIAGNOSIS — I480 Paroxysmal atrial fibrillation: Secondary | ICD-10-CM

## 2016-01-07 DIAGNOSIS — E785 Hyperlipidemia, unspecified: Secondary | ICD-10-CM | POA: Diagnosis not present

## 2016-01-07 LAB — LIPID PANEL
CHOLESTEROL: 229 mg/dL — AB (ref 125–200)
HDL: 88 mg/dL (ref >=46)
LDL Cholesterol: 128 mg/dL (ref <130)
Total CHOL/HDL Ratio: 2.6 Ratio (ref <=5.0)
Triglycerides: 64 mg/dL (ref <150)
VLDL: 13 mg/dL (ref <30)

## 2016-01-07 LAB — COMPREHENSIVE METABOLIC PANEL
ALK PHOS: 68 U/L (ref 33–130)
ALT: 9 U/L (ref 6–29)
AST: 22 U/L (ref 10–35)
Albumin: 3.7 g/dL (ref 3.6–5.1)
BILIRUBIN TOTAL: 0.6 mg/dL (ref 0.2–1.2)
BUN: 20 mg/dL (ref 7–25)
CO2: 25 mmol/L (ref 20–31)
CREATININE: 1 mg/dL — AB (ref 0.60–0.93)
Calcium: 8.8 mg/dL (ref 8.6–10.4)
Chloride: 106 mmol/L (ref 98–110)
GLUCOSE: 88 mg/dL (ref 65–99)
POTASSIUM: 4 mmol/L (ref 3.5–5.3)
SODIUM: 139 mmol/L (ref 135–146)
TOTAL PROTEIN: 6.6 g/dL (ref 6.1–8.1)

## 2016-01-07 LAB — CBC
HCT: 38.5 % (ref 35.0–45.0)
HEMOGLOBIN: 13.2 g/dL (ref 11.7–15.5)
MCH: 31.6 pg (ref 27.0–33.0)
MCHC: 34.3 g/dL (ref 32.0–36.0)
MCV: 92.1 fL (ref 80.0–100.0)
MPV: 8.2 fL (ref 7.5–12.5)
Platelets: 261 10*3/uL (ref 140–400)
RBC: 4.18 MIL/uL (ref 3.80–5.10)
RDW: 12.9 % (ref 11.0–15.0)
WBC: 7.6 10*3/uL (ref 3.8–10.8)

## 2016-01-07 NOTE — Progress Notes (Signed)
Cardiology Office Note   Date:  01/07/2016   ID:  [name], DOB 1934-08-16, MRN [id number]  PCP:  Elby Showers, MD  Cardiologist:  Previous Darlin Coco MD , now [place]   Chief Complaint  Patient presents with  . Follow-up   Problem List 1. Paroxysmal atrial fibrillation 2. Hyperlipidemia 3. 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 Hypothyroidism-     History of Present Illness: [name] is a 81 y.o. female who presents for a six-month follow-up visit  This 81 year old woman is seen for a scheduled followup office visit. She has a history of intermittent high-grade AV block and has a functioning St. Jude dual-chamber pacemaker. She is followed for this by Dr. Lovena Le. She has a history of hypercholesterolemia. She has a history of osteoarthritis. She has also had some memory issues and is followed by Dr. Brett Fairy. Since last visit she has had no new cardiac symptoms. She has not had any dizziness or syncope. No chest pain. She has had a past history of unintentional weight loss but since her last visit she has had no change in weight. The patient had a chest x-ray on 02/19/14 which showed no abnormality. She complains of feeling tired and worn out. She has had extensive lab work by her PCP which did not show any evidence of malignancy. She had a CT scan of the abdomen and pelvis on 08/08/14 which did not show any abdominal aortic aneurysm or other cause of weight loss. She complains of dyspnea and not being able to get a complete breath. Impression The patient developed a fractured right hip. Her husband and she notes that she never fell. Her hip just started to hurt. X-rays confirmed a fracture and Dr. Rhona Raider operated on her on 01/03/15. She is walking with a cane. Her recent pacemaker interrogation revealed that she was having episodes of paroxysmal atrial fibrillation 1% of the time.  This does put her at increased risk for thromboembolic  disease.  January 07, 2016:  Was seen with her husband today . She has some dementia and husband answered most of the detailed questions .   Was started on Eliquis for PAF by Dr. Mare Ferrari at her last visit Tolerating it well.  Sees Dr. Chalmers Cater for hypothyroidism    Past Medical History  Diagnosis Date  . Hyperlipidemia   . Thyroid disease   . Arthritis   . Asthma   . Depression   . Colon polyps   . Environmental allergies   . Complete heart block -intermittent     a. 11/2012 s/p SJM Accent DR RF dc ppm.  . Osteoarthritis     hands  . Memory retention disorder 03/22/2013  . Allergy     Past Surgical History  Procedure Laterality Date  . Knee surgery    . Salpingoophorectomy      right  . Appendectomy  1965  . Colonoscopy  2007    adenomatous polyps  . Insert / replace / remove pacemaker  12/02/2012     Dr Lovena Le  . Hand surgery    . Ovarian cystectomy  1965  . Tumor removed  1985    abdomen  . Cervical polypectomy  1996  . Permanent pacemaker insertion N/A 12/02/2012    Procedure: PERMANENT PACEMAKER INSERTION;  Surgeon: Evans Lance, MD;  Location: [facility] CATH LAB;  Service: Cardiovascular;  Laterality: N/A;  . Eye surgery    . Hip arthroplasty Right 01/03/2015    Procedure:  ARTHROPLASTY BIPOLAR HIP (HEMIARTHROPLASTY);  Surgeon: [name], MD;  Location: [facility]S;  Service: Orthopedics;  Laterality: Right;     Current Outpatient Prescriptions  Medication Sig Dispense Refill  . alendronate (FOSAMAX) 70 MG tablet     . apixaban (ELIQUIS) 5 MG TABS tablet Take 1 tablet (5 mg total) by mouth 2 (two) times daily. 60 tablet 11  . fluticasone (FLONASE) 50 MCG/ACT nasal spray Place 1 spray into both nostrils 2 (two) times daily as needed. Reported on 08/22/2015    . HYDROcodone-acetaminophen (NORCO/VICODIN) 5-325 MG per tablet Take 1 tablet by mouth every 6 (six) hours as needed for moderate pain. Reported on 08/15/2015    . levothyroxine (SYNTHROID, LEVOTHROID) 75 MCG tablet  Take 75 mcg by mouth daily before breakfast.     . memantine (NAMENDA XR) 28 MG CP24 24 hr capsule Take 1 capsule (28 mg total) by mouth daily. 30 capsule 7  . methylcellulose (ARTIFICIAL TEARS) 1 % ophthalmic solution Place 1 drop into both eyes 4 (four) times daily.     . traMADol (ULTRAM) 50 MG tablet Take 50 mg by mouth every 8 (eight) hours as needed (pain). Reported on 08/15/2015     No current facility-administered medications for this visit.    Allergies:   Sulfa antibiotics and Sulfasalazine    Social History:  The patient  reports that she has never smoked. She has never used smokeless tobacco. She reports that she drinks alcohol. She reports that she does not use illicit drugs.   Family History:  The patient's family history includes Colon cancer in her father; Colon polyps in her brother; Coronary artery disease in her brother; Diabetes in her brother.    ROS:  Please see the history of present illness.   Otherwise, review of systems are positive for none.   All other systems are reviewed and negative.    PHYSICAL EXAM: VS:  BP 98/60 mmHg  Pulse 74  Ht 5\' 8"  (1.727 m)  Wt 131 lb 12.8 oz (59.784 kg)  BMI 20.04 kg/m2 , BMI Body mass index is 20.04 kg/(m^2). GEN: Well nourished, well developed, in no acute distress HEENT: normal Neck: no JVD, carotid bruits, or masses Cardiac: RRR; no murmurs, rubs, or gallops,no edema  Respiratory:  clear to auscultation bilaterally, normal work of breathing GI: soft, nontender, nondistended, + BS MS: no deformity or atrophy Skin: warm and dry, no rash Neuro:  Strength and sensation are intact Psych: euthymic mood, full affect   EKG:  EKG is ordered today.  sinus rhythm at 74, 1st degree AV block    Recent Labs: 01/25/2015: ALT 12; BUN 21; Creat 0.63; Hemoglobin 12.1; Platelets 283; Potassium 4.2; Sodium 138; TSH 0.957    Lipid Panel    Component Value Date/Time   CHOL 191 10/17/2013 1119   TRIG 53.0 10/17/2013 1119   HDL  74.50 10/17/2013 1119   CHOLHDL 3 10/17/2013 1119   VLDL 10.6 10/17/2013 1119   LDLCALC 106* 10/17/2013 1119   LDLDIRECT 127.6 04/21/2013 1127      Wt Readings from Last 3 Encounters:  01/07/16 131 lb 12.8 oz (59.784 kg)  08/22/15 140 lb (63.504 kg)  08/15/15 143 lb 12.8 oz (65.227 kg)       ASSESSMENT AND PLAN:  1. Functioning St. Jude dual-chamber pacemaker for intermittent high-grade AV block. 2. memory disorder 3. hypercholesterolemia followed by Dr. Renold Genta 4. Hypothyroidism  5.  Paroxysmal atrial fibrillation. This patients CHA2DS2-VASc Score is 3 for age and female  sex. Above score calculated as 1 point each if present [CHF, HTN, DM, Vascular=MI/PAD/Aortic Plaque, Age if 18-74, or Female] Above score calculated as 2 points each if present [Age > 75, or Stroke/TIA/TE]  Continue Eliquis 5 BID  No bleeding .  Will check CBC today   Current medicines are reviewed at length with the patient today.  The patient does not have concerns regarding medicines.  The following changes have been made:  We discussed the risks and benefits of anticoagulation.  She does not have any history of blood loss anemia.  We will start her on Apixaban 5 mg twice a day.  This is the proper dose for her.  She weighs 66 kg and age is less than 59.  Renal function has been normal in the past.  Labs/ tests ordered today include:   No orders of the defined types were placed in this encounter.     Mertie Moores, MD  01/07/2016 9:21 AM    [address],  [place], [place]  [address] Pager [phone] Phone: [phone]; Fax: [phone]

## 2016-01-07 NOTE — Patient Instructions (Signed)
Medication Instructions:  None  Labwork: CMET, Lipids, CBC today  Testing/Procedures: None  Follow-Up: Your physician wants you to follow-up in: 6 months with Dr. Acie Fredrickson.  You will receive a reminder letter in the mail two months in advance. If you don't receive a letter, please call our office to schedule the follow-up appointment.   Any Other Special Instructions Will Be Listed Below (If Applicable).     If you need a refill on your cardiac medications before your next appointment, please call your pharmacy.

## 2016-01-31 ENCOUNTER — Ambulatory Visit (INDEPENDENT_AMBULATORY_CARE_PROVIDER_SITE_OTHER): Payer: Medicare Other | Admitting: Internal Medicine

## 2016-01-31 VITALS — BP 102/64 | HR 70 | Temp 98.1°F | Ht 68.0 in | Wt 138.0 lb

## 2016-01-31 DIAGNOSIS — R413 Other amnesia: Secondary | ICD-10-CM | POA: Diagnosis not present

## 2016-01-31 DIAGNOSIS — J069 Acute upper respiratory infection, unspecified: Secondary | ICD-10-CM | POA: Diagnosis not present

## 2016-01-31 DIAGNOSIS — J309 Allergic rhinitis, unspecified: Secondary | ICD-10-CM

## 2016-01-31 DIAGNOSIS — Z950 Presence of cardiac pacemaker: Secondary | ICD-10-CM | POA: Diagnosis not present

## 2016-01-31 DIAGNOSIS — Z8673 Personal history of transient ischemic attack (TIA), and cerebral infarction without residual deficits: Secondary | ICD-10-CM

## 2016-01-31 DIAGNOSIS — Z7901 Long term (current) use of anticoagulants: Secondary | ICD-10-CM | POA: Diagnosis not present

## 2016-01-31 DIAGNOSIS — R05 Cough: Secondary | ICD-10-CM

## 2016-01-31 DIAGNOSIS — Z8679 Personal history of other diseases of the circulatory system: Secondary | ICD-10-CM | POA: Diagnosis not present

## 2016-01-31 DIAGNOSIS — R059 Cough, unspecified: Secondary | ICD-10-CM

## 2016-01-31 MED ORDER — BENZONATATE 100 MG PO CAPS: 100.0000 mg | ORAL_CAPSULE | Freq: Three times a day (TID) | ORAL | 0 refills | Status: DC | PRN

## 2016-01-31 MED ORDER — AZITHROMYCIN 250 MG PO TABS: ORAL_TABLET | ORAL | 0 refills | Status: DC

## 2016-01-31 MED ORDER — METHYLPREDNISOLONE ACETATE 80 MG/ML IJ SUSP
80.0000 mg | Freq: Once | INTRAMUSCULAR | Status: AC
  Administered 2016-01-31: 80 mg via INTRAMUSCULAR

## 2016-02-11 ENCOUNTER — Telehealth: Payer: Self-pay | Admitting: Cardiology

## 2016-02-11 NOTE — Telephone Encounter (Signed)
Did not need this encounter at this time,wrong provider

## 2016-02-11 NOTE — Telephone Encounter (Signed)
Encounter needs to be closed

## 2016-02-15 ENCOUNTER — Encounter: Payer: Self-pay | Admitting: Internal Medicine

## 2016-02-15 NOTE — Patient Instructions (Signed)
Takes Zithromax Z-PAK as directed. Tessalon Perles 100 mg 3 times daily as needed for cough. Depo-Medrol 80 mg IM. Call if not better in 7-10 days or sooner if worse.

## 2016-02-15 NOTE — Progress Notes (Signed)
   Subjective:    Patient ID: [name], female    DOB: August 25, 1934, 81 y.o.   MRN: [id number]  HPI 81 year old White Female in today with complaint of respiratory infection for several days. No fever or shaking chills. Cough with some sputum production. Patient is a poor historian and it's difficult to get a clear history from her.  Not seen here since 2014  She has a history of hypothyroidism and dementia. History of atrial fibrillation. She is on Eliquis. She has a pacemaker . History of intermittent high-grade AV block. Has dual-chamber pacemaker followed by Dr. Lovena Le. History of TIA in the past in 2011 with some transient left-sided weakness. History of hyperlipidemia. Sees neurologist for memory loss. Previous EEG has been normal. She is to be on Aricept but now is on Namenda. She lives with her husband. She does not drive. Does minimal cooking.  Right hip fracture July 2016.   Review of Systems history of allergic rhinitis     Objective:   Physical Exam skin warm and dry. Nodes none. TMs and pharynx are clear. Neck is supple. Chest clear to auscultation without rales or wheezing.      Assessment & Plan:  Probable acute URI  History of allergic rhinitis  History of pacemaker  History of TIA  History of atrial fibrillation on chronic anticoagulation  Memory loss treated by a neurologist  Plan: Zithromax Z-Pak take 2 tablets day one followed by 1 tablet days 2 through 5. Tessalon Perles 100 mg 3 times daily as needed for cough. Call if not better in 7-10 days or sooner if worse. Depo-Medrol 80 mg IM.

## 2016-02-25 ENCOUNTER — Ambulatory Visit: Payer: Medicare Other | Admitting: Neurology

## 2016-02-26 ENCOUNTER — Ambulatory Visit (INDEPENDENT_AMBULATORY_CARE_PROVIDER_SITE_OTHER): Payer: Medicare Other | Admitting: *Deleted

## 2016-02-26 DIAGNOSIS — I442 Atrioventricular block, complete: Secondary | ICD-10-CM | POA: Diagnosis not present

## 2016-02-26 NOTE — Progress Notes (Signed)
Remote pacemaker transmission.   

## 2016-02-28 ENCOUNTER — Encounter: Payer: Self-pay | Admitting: Cardiology

## 2016-03-04 ENCOUNTER — Telehealth: Payer: Self-pay

## 2016-03-04 ENCOUNTER — Ambulatory Visit: Payer: Medicare Other | Admitting: Neurology

## 2016-03-04 NOTE — Telephone Encounter (Signed)
I spoke to pt's husband. I advised him that pt's appt today for 03/04/2016 needs to be r/s because Dr. Brett Fairy is out of the office. Pt's husband is agreeable for an appt on 03/19/2016 at 9:00.

## 2016-03-06 LAB — CUP PACEART REMOTE DEVICE CHECK
Battery Voltage: 2.99 V
Brady Statistic AP VP Percent: 7.5 %
Brady Statistic AS VP Percent: 20 %
Brady Statistic RA Percent Paced: 14 %
Brady Statistic RV Percent Paced: 27 %
Date Time Interrogation Session: 20170906074123
Implantable Lead Implant Date: 20140613
Implantable Lead Location: 753859
Lead Channel Impedance Value: 390 Ohm
Lead Channel Pacing Threshold Amplitude: 0.5 V
Lead Channel Pacing Threshold Pulse Width: 0.5 ms
Lead Channel Sensing Intrinsic Amplitude: 12 mV
Lead Channel Setting Pacing Amplitude: 2 V
MDC IDC LEAD IMPLANT DT: 20140613
MDC IDC LEAD LOCATION: 753860
MDC IDC MSMT BATTERY REMAINING LONGEVITY: 115 mo
MDC IDC MSMT BATTERY REMAINING PERCENTAGE: 95.5 %
MDC IDC MSMT LEADCHNL RA IMPEDANCE VALUE: 400 Ohm
MDC IDC MSMT LEADCHNL RA SENSING INTR AMPL: 4.5 mV
MDC IDC MSMT LEADCHNL RV PACING THRESHOLD AMPLITUDE: 1.25 V
MDC IDC MSMT LEADCHNL RV PACING THRESHOLD PULSEWIDTH: 0.5 ms
MDC IDC PG SERIAL: 7474456
MDC IDC SET LEADCHNL RV PACING AMPLITUDE: 2.5 V
MDC IDC SET LEADCHNL RV PACING PULSEWIDTH: 0.5 ms
MDC IDC SET LEADCHNL RV SENSING SENSITIVITY: 2 mV
MDC IDC STAT BRADY AP VS PERCENT: 8.2 %
MDC IDC STAT BRADY AS VS PERCENT: 62 %

## 2016-03-19 ENCOUNTER — Encounter: Payer: Self-pay | Admitting: Neurology

## 2016-03-19 ENCOUNTER — Ambulatory Visit (INDEPENDENT_AMBULATORY_CARE_PROVIDER_SITE_OTHER): Payer: Medicare Other | Admitting: Neurology

## 2016-03-19 DIAGNOSIS — G301 Alzheimer's disease with late onset: Secondary | ICD-10-CM | POA: Diagnosis not present

## 2016-03-19 DIAGNOSIS — F0280 Dementia in other diseases classified elsewhere without behavioral disturbance: Secondary | ICD-10-CM | POA: Insufficient documentation

## 2016-03-19 DIAGNOSIS — G309 Alzheimer's disease, unspecified: Secondary | ICD-10-CM

## 2016-03-19 MED ORDER — MEMANTINE HCL-DONEPEZIL HCL 7 & 14 & 21 &28 -10 MG PO C4PK: 7.0000 mg | EXTENDED_RELEASE_CAPSULE | Freq: Every morning | ORAL | 0 refills | Status: DC

## 2016-03-19 MED ORDER — MEMANTINE HCL-DONEPEZIL HCL ER 28-10 MG PO CP24: 28.0000 mg | ORAL_CAPSULE | Freq: Every morning | ORAL | 5 refills | Status: DC

## 2016-03-19 NOTE — Progress Notes (Signed)
[facility]  PATIENT: [name] DOB: 1934-11-16   REASON FOR VISIT: follow up for memory loss HISTORY FROM:patient and husband    HISTORY OF PRESENT ILLNESS:   [name],  A caucasian 81 year old female returns for follow-up in the presence of her husband. . When last seen  By Cecille Rubin, NP. she had stopped her Aricept but restarted it after the visit and now wants to combine with Namenda. She says she exercises by walking several times a week, she ambulates with a cane she denies any recent falls. She no longer cooks,  the couple usually eats at [employer] . She is able to perform her activities of daily living such as dressing bathing etc. Husband does the finances. She recovered from  Shingles ( right flank and groin ) , her husband had them , too. Both of them after vaccine! . She returns for reevaluation and to change to Namziric if cost efficient.   07/27/14 CM [name], 81 year old female returns for followup. She was last seen by Dr. Brett Fairy 01/30/2014. At that time her Mini-Mental Status exam score had changed significantly from 28 to 23. EEG in the past normal. Dementia labs return normal. Ct of the head without change from last in Jan 2011. She was taking Aricept 23 mg daily until 2 weeks ago when she read an article in the newspaper that there was a better drug on the market. She does not remember what that drug was. She claims she exercises by walking every day. She does minimal cooking she and her husband eat out a lot. She is able to perform her activities of daily living such as dressing bathing etc. She does not do the finances. She lives with her husband. She does not drive. 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 The patient has a history of  depression, hypothyroidism, possible pre-diabetes. Dr. Renold Genta is concerned about possible early dementia. The patient also states that she has a pacemaker has a history of a first degree A V. block. She'll has a history of dizziness and possible TIA and is an established patient with Dr. Leonie Man at [facility].  She had transient left-sided weakness when he saw her in 2011. In March 2011, she underwent an MRI of the brain ( ordering physician was Dr. Brett Fairy) with no evidence of tumor or infarct or bleed. Evident was chronic microvascular ischemia ,but this was considered age related ( Dr Leonie Man interpreted) .  The evaluation for possible stroke or TIA in March 2011 followed a history of a sudden fall within the confines of her home. She had walked from the living room to the kitchen and fell between the coffee table and the sofa. Apparently, there was a second episode when she fell suddenly to the right. The second episode was associated with numbness of the left side. 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 A cardiac monitoring revealed AV block as the possible cause for her falls. She has seen Dr. Crissie Sickles twice 2011 year for syncope with a sick sinus syndrome ( but states she" hadn't fainted " ). She has a Diplomatic Services operational officer since ? (" i don't know when- its been a while " )  Meanwhile, the patient feels  that her memory has never been that much better and that in all functions have been taking away from her- she never needed to keep the financial books.  She feels, that she is performrng at the same level as last year or 2 years ago.  Extensive laboratory results were already performed by her primary care physician and reviewed today, she has no problems with sleep, she has no nocturnal hallucinations she reports normal light terrors or REM behavior disorder, she also had no recent falls. There is no parkinsonism, shuffling gait, urinary  incontinence.     REVIEW OF SYSTEMS: Full 14 system review of systems performed and notable only for those listed, all others are neg:  Gastroitestinal: urinary frequency  Neurological: memory loss  ALLERGIES: Allergies  Allergen Reactions  . Sulfa Antibiotics Diarrhea and Rash  . Sulfasalazine Diarrhea and Rash    HOME MEDICATIONS: Outpatient Medications Prior to Visit  Medication Sig Dispense Refill  . alendronate (FOSAMAX) 70 MG tablet     . apixaban (ELIQUIS) 5 MG TABS tablet Take 1 tablet (5 mg total) by mouth 2 (two) times daily. 60 tablet 11  . azithromycin (ZITHROMAX Z-PAK) 250 MG tablet Take 2 tablets (500 mg) on  Day 1,  followed by 1 tablet (250 mg) once daily on Days 2 through 5. 6 each 0  . benzonatate (TESSALON) 100 MG capsule Take 1 capsule (100 mg total) by mouth 3 (three) times daily as needed for cough. 30 capsule 0  . fluticasone (FLONASE) 50 MCG/ACT nasal spray Place 1 spray into both nostrils 2 (two) times daily as needed. Reported on 08/22/2015    . HYDROcodone-acetaminophen (NORCO/VICODIN) 5-325 MG per tablet Take 1 tablet by mouth every 6 (six) hours as needed for moderate pain. Reported on 08/15/2015    . levothyroxine (SYNTHROID, LEVOTHROID) 75 MCG tablet Take 75 mcg by mouth daily before breakfast.     . memantine (NAMENDA XR) 28 MG CP24 24 hr capsule Take 1 capsule (28 mg total) by mouth daily. 30 capsule 7  . methylcellulose (ARTIFICIAL TEARS) 1 % ophthalmic solution Place 1 drop into both eyes 4 (four) times daily.     . traMADol (ULTRAM) 50 MG tablet Take 50 mg by mouth every 8 (eight) hours as needed (pain). Reported on 08/15/2015     No facility-administered medications prior to visit.     PAST MEDICAL HISTORY: Past Medical History:  Diagnosis Date  . Allergy   . Arthritis   . Asthma   . Colon polyps   . Complete heart block -intermittent    a. 11/2012 s/p SJM Accent DR RF dc ppm.  . Depression   . Environmental allergies   . Hyperlipidemia   .  Memory retention disorder 03/22/2013  . Osteoarthritis    hands  . Thyroid disease     PAST SURGICAL HISTORY: Past Surgical History:  Procedure Laterality Date  . APPENDECTOMY  1965  . CERVICAL POLYPECTOMY  1996  . COLONOSCOPY  2007   adenomatous polyps  . EYE SURGERY    . HAND SURGERY    . HIP ARTHROPLASTY Right 01/03/2015   Procedure: ARTHROPLASTY BIPOLAR HIP (HEMIARTHROPLASTY);  Surgeon: [name], MD;  Location: [facility];  Service: Orthopedics;  Laterality: Right;  . INSERT / REPLACE / REMOVE PACEMAKER  12/02/2012    Dr Lovena Le  . KNEE SURGERY    . ovarian cystectomy  1965  . PERMANENT PACEMAKER INSERTION N/A 12/02/2012   Procedure: PERMANENT PACEMAKER INSERTION;  Surgeon: Champ Mungo  Lovena Le, MD;  Location: [facility] CATH LAB;  Service: Cardiovascular;  Laterality: N/A;  . SALPINGOOPHORECTOMY     right  . tumor removed  1985   abdomen    FAMILY HISTORY: Family History  Problem Relation Age of Onset  . Colon cancer Father   . Colon polyps Brother   . Diabetes Brother     maternal aunt  . Coronary artery disease Brother     SOCIAL HISTORY: Social History   Social History  . Marital status: Married    Spouse name: [name]  . Number of children: 3  . Years of education: College   Occupational History  . elem. school teacher     retired   Social History Main Topics  . Smoking status: Never Smoker  . Smokeless tobacco: Never Used  . Alcohol use 0.0 oz/week     Comment: occas. which is seldom  . Drug use: No  . Sexual activity: Not on file   Other Topics Concern  . Not on file   Social History Narrative   Patient has one daughter, two adopted children(son,daughter).   Patient lives at home with spouse [name]).   Patient is retired.   Patient has a college education.   Patient is right-handed.   Patient drinks very little caffeine.     PHYSICAL EXAM  There were no vitals filed for this visit. There is no height or weight on file to calculate  BMI. Generalized: Well developed, in no acute distress , well groomed Head: normocephalic and atraumatic,. Oropharynx benign  Neck: Supple Cardiac: Regular rate rhythm Musculoskeletal: No deformity   Neurological examination   Mentation: Alert oriented to time, place, history taking. MMSE - Mini Mental State Exam 03/19/2016 08/22/2015 07/27/2014  Orientation to time 1 2 4   Orientation to Place 2 1 4   Registration 3 3 3   Attention/ Calculation 5 4 5   Recall 0 0 3  Language- name 2 objects 2 2 2   Language- repeat 1 1 1   Language- follow 3 step command 3 3 3   Language- read & follow direction 1 1 1   Write a sentence 1 0 1  Copy design 1 1 1   Total score 20 18 28     Recent and remote memory intact. Follows all commands speech and language fluent.   Cranial nerve ; normal sense of smell and taste is preserved. .Pupils were equal round reactive to light extraocular movements were full, visual field were full on confrontational test. Facial sensation and strength were normal. hearing was intact to finger rubbing bilaterally. Uvula tongue midline. head turning and shoulder shrug were normal and symmetric.Tongue protrusion into cheek strength was normal. Motor: normal bulk and tone, full strength in the BUE, BLE, fine finger movements normal, no pronator drift. No focal weakness Sensory: normal and symmetric to light touch, pinprick, and Vibration, proprioception  Coordination: finger-nose-finger, heel-to-shin bilaterally, no dysmetria Reflexes:  2/2, Gait and Station: Rising up from seated position without assistance, normal stance, moderate stride, good arm swing, smooth turning, able to perform tiptoe, and heel walking without difficulty. Tandem gait is steady.   She has recovered from a femur fracture July 2016 .   DIAGNOSTIC DATA (LABS, IMAGING, TESTING) - ASSESSMENT AND PLAN  81 y.o. year old female  , seen here in a 35 minute visit with memory testing.  PLAN:  Change to  Namziric,  Exercise for overall health and well being Exercise the brain such as word games cross words strategies etc. Create a safe  environment, remove locks on bathroom  Doors Reduced confusion, keep familiar objects and people around, stick to a routine Use effective communication such as simple words and short sentences Reduce nighttime restlessness, a consistent nighttime routine,  avoid napping during the day Encourage good nutrition and hydration Follow-up in 6 months next visit with Hoyle Sauer.   [place], [facility] [address] [place], [place] [address] [phone]

## 2016-03-19 NOTE — Patient Instructions (Addendum)
Combination drug aricept and namenda.   Donepezil; Memantine extended-release capsule What is this medicine? DONEPEZIL; MEMANTINE (doe NEP e zil; MEM an teen) is used to treat dementia caused by Alzheimer's disease. This medicine may be used for other purposes; ask your health care provider or pharmacist if you have questions. What should I tell my health care provider before I take this medicine? They need to know if you have any of these conditions: -difficulty passing urine -head injury -heart disease -irregular heartbeat -kidney disease -liver disease -lung or breathing disease, like asthma -seizures -stomach or intestinal disease, ulcers or stomach bleeding -an unusual or allergic reaction to donepezil, memantine, other medicines, foods, dyes, or preservatives -pregnant or trying to get pregnant -breast-feeding How should I use this medicine? Take this medicine by mouth with a glass of water. Follow the directions on the prescription label. You may take this medicine with or without food. Swallow the capsules whole. Do not chew or crush. If swallowing is difficult, you may open the capsules and sprinkle the entire contents on cool applesauce before swallowing. Take your doses at regular intervals. Do not take your medicine more often than directed. Continue to take your medicine even if you feel better. Do not stop taking except on the advice of your doctor or health care professional. Talk to your pediatrician regarding the use of this medicine in children. Special care may be needed. Overdosage: If you think you have taken too much of this medicine contact a poison control center or emergency room at once. NOTE: This medicine is only for you. Do not share this medicine with others. What if I miss a dose? If you miss a dose, take it as soon as you can. If it is almost time for your next dose, take only that dose. Do not take double or extra doses. If you do not take your medicine for  several days, contact your health care provider. Your dose may need to be changed. What may interact with this medicine? Do not take this medicine with any of the following medications: -certain medicines for fungal infections like itraconazole, fluconazole, posaconazole, and voriconazole -cisapride -dextromethorphan; quinidine -dofetilide -dronedarone -pimozide -quinidine -thioridazine -ziprasidone This medicine may also interact with the following medications: -acetazolamide -antihistamines for allergy, cough and cold -atropine -bethanechol -carbamazepine -certain medicines for bladder problems like oxybutynin, tolterodine -certain medicines for Parkinson's disease like benztropine, trihexyphenidyl -certain medicines for stomach problems like dicyclomine, hyoscyamine -certain medicines for travel sickness like scopolamine -cimetidine -dexamethasone -hydrochlorothiazide (HCTZ) -ketamine -ipratropium -metformin -methazolamide -nicotine -NSAIDs, medicines for pain and inflammation, like ibuprofen or naproxen -other medicines for Alzheimer's disease -other medicines that prolong the QT interval (cause an abnormal heart rhythm) -phenobarbital -phenytoin -ranitidine -rifampin, rifabutin or rifapentine -sodium bicarbonate -succinylcholine -triamterene This list may not describe all possible interactions. Give your health care provider a list of all the medicines, herbs, non-prescription drugs, or dietary supplements you use. Also tell them if you smoke, drink alcohol, or use illegal drugs. Some items may interact with your medicine. What should I watch for while using this medicine? Visit your doctor or health care professional for regular checks on your progress. Check with your doctor or health care professional if there is no improvement in your symptoms or if they get worse. You may get drowsy or dizzy. Do not drive, use machinery, or do anything that needs mental alertness  until you know how this drug affects you. Do not stand or sit up quickly, especially if you are  an older patient. This reduces the risk of dizzy or fainting spells. Alcohol can make you more drowsy and dizzy. Avoid alcoholic drinks. If you are going to need surgery or other procedure, tell your doctor that you are using this medicine. What side effects may I notice from receiving this medicine? Side effects that you should report to your doctor or health care professional as soon as possible: -allergic reactions like skin rash, itching or hives, swelling of the face, lips, or tongue -changes in vision -depressed mood -feeling faint or lightheaded, falls -hallucinations -problems with balance -redness, blistering, peeling or loosening of the skin, including inside the mouth -seizures -slow heartbeat, or palpitations -stomach pain -unusual bleeding or bruising, red or purple spots on the skin -vomiting -weight loss Side effects that usually do not require medical attention (Report these to your doctor or health care professional if they continue or are bothersome): -diarrhea -dizziness -headache -indigestion or heartburn -loss of appetite -nausea This list may not describe all possible side effects. Call your doctor for medical advice about side effects. You may report side effects to FDA at 1-800-FDA-1088. Where should I keep my medicine? Keep out of the reach of children. Store at room temperature between 15 and 30 degrees C (59 and 86 degrees F). Throw away any unused medicine after the expiration date. NOTE: This sheet is a summary. It may not cover all possible information. If you have questions about this medicine, talk to your doctor, pharmacist, or health care provider.    2016, Elsevier/Gold Standard. (2014-01-19 11:27:21)

## 2016-03-20 ENCOUNTER — Ambulatory Visit (INDEPENDENT_AMBULATORY_CARE_PROVIDER_SITE_OTHER): Payer: Medicare Other | Admitting: Internal Medicine

## 2016-03-20 ENCOUNTER — Encounter: Payer: Self-pay | Admitting: Internal Medicine

## 2016-03-20 VITALS — BP 100/58 | HR 78 | Temp 97.6°F | Wt 140.0 lb

## 2016-03-20 DIAGNOSIS — Z23 Encounter for immunization: Secondary | ICD-10-CM | POA: Diagnosis not present

## 2016-03-20 DIAGNOSIS — W57XXXA Bitten or stung by nonvenomous insect and other nonvenomous arthropods, initial encounter: Secondary | ICD-10-CM

## 2016-03-20 DIAGNOSIS — R413 Other amnesia: Secondary | ICD-10-CM | POA: Diagnosis not present

## 2016-03-20 DIAGNOSIS — T148 Other injury of unspecified body region: Secondary | ICD-10-CM

## 2016-03-20 MED ORDER — TRIAMCINOLONE ACETONIDE 0.1 % EX CREA: 1.0000 "application " | TOPICAL_CREAM | Freq: Three times a day (TID) | CUTANEOUS | 0 refills | Status: DC

## 2016-03-20 NOTE — Progress Notes (Signed)
   Subjective:    Patient ID: [name], female    DOB: April 02, 1935, 81 y.o.   MRN: [id number]  HPI 81 year old female with memory loss in today with multiple papules left upper arm. Thinks they may have been present for about a week. Doesn't recall any insect bites but does walk her dog. These are not painful but slightly itchy. Has noticed increased warmth in the left upper arm area.    Review of Systems     Objective:   Physical Exam  She has multiple small papular lesions with central punctum consistent with insect bites. These are not painful. There is some surrounding erythema mid left upper arm with these lesions. I do not think there is a significant cellulitis.      Assessment & Plan:  Multiple insect bites  Plan: Flu vaccine given  Triamcinolone cream 0.1% to lesions left upper arm 3 times daily until healed.

## 2016-03-20 NOTE — Patient Instructions (Signed)
Triamcinolone cream to insect bites left arm 3 times daily until healed

## 2016-05-18 ENCOUNTER — Encounter: Payer: Self-pay | Admitting: Internal Medicine

## 2016-05-25 ENCOUNTER — Encounter: Payer: Self-pay | Admitting: Internal Medicine

## 2016-05-25 ENCOUNTER — Ambulatory Visit (INDEPENDENT_AMBULATORY_CARE_PROVIDER_SITE_OTHER): Payer: Medicare Other | Admitting: Internal Medicine

## 2016-05-25 VITALS — BP 98/58 | HR 79 | Ht 68.0 in | Wt 141.0 lb

## 2016-05-25 DIAGNOSIS — Z950 Presence of cardiac pacemaker: Secondary | ICD-10-CM

## 2016-05-25 DIAGNOSIS — I480 Paroxysmal atrial fibrillation: Secondary | ICD-10-CM

## 2016-05-25 LAB — CUP PACEART INCLINIC DEVICE CHECK
Date Time Interrogation Session: 20171204121425
Implantable Lead Location: 753859
Implantable Pulse Generator Implant Date: 20140613
Lead Channel Pacing Threshold Amplitude: 1 V
Lead Channel Sensing Intrinsic Amplitude: 12 mV
Lead Channel Setting Pacing Amplitude: 2 V
Lead Channel Setting Pacing Pulse Width: 0.5 ms
Lead Channel Setting Sensing Sensitivity: 2 mV
MDC IDC LEAD IMPLANT DT: 20140613
MDC IDC LEAD IMPLANT DT: 20140613
MDC IDC LEAD LOCATION: 753860
MDC IDC MSMT BATTERY VOLTAGE: 2.98 V
MDC IDC MSMT LEADCHNL RA IMPEDANCE VALUE: 387.5 Ohm
MDC IDC MSMT LEADCHNL RA PACING THRESHOLD AMPLITUDE: 0.5 V
MDC IDC MSMT LEADCHNL RA PACING THRESHOLD PULSEWIDTH: 0.5 ms
MDC IDC MSMT LEADCHNL RA SENSING INTR AMPL: 5 mV
MDC IDC MSMT LEADCHNL RV IMPEDANCE VALUE: 350 Ohm
MDC IDC MSMT LEADCHNL RV PACING THRESHOLD PULSEWIDTH: 0.5 ms
MDC IDC PG SERIAL: 7474456
MDC IDC SET LEADCHNL RV PACING AMPLITUDE: 2.5 V
MDC IDC STAT BRADY RA PERCENT PACED: 16 %
MDC IDC STAT BRADY RV PERCENT PACED: 29 %
Pulse Gen Model: 2210

## 2016-05-25 MED ORDER — APIXABAN 5 MG PO TABS: 5.0000 mg | ORAL_TABLET | Freq: Two times a day (BID) | ORAL | 11 refills | Status: DC

## 2016-05-25 NOTE — Progress Notes (Signed)
HPI [name] returns today for followup. She is a very pleasant 81 year old woman with a history of symptomatic bradycardia, secondary to sinus node dysfunction, status post permanent pacemaker insertion. She also has a history of heart block. In the interim, she has been stable. She denies chest pain. No peripheral edema. No symptomatic SVT. Her husband is in charge of her medications. She admits to being sedentary. Allergies  Allergen Reactions  . Sulfa Antibiotics Diarrhea and Rash  . Sulfasalazine Diarrhea and Rash     Current Outpatient Prescriptions  Medication Sig Dispense Refill  . apixaban (ELIQUIS) 5 MG TABS tablet Take 1 tablet (5 mg total) by mouth 2 (two) times daily. 60 tablet 11  . azithromycin (ZITHROMAX Z-PAK) 250 MG tablet Take 2 tablets (500 mg) on  Day 1,  followed by 1 tablet (250 mg) once daily on Days 2 through 5. 6 each 0  . benzonatate (TESSALON) 100 MG capsule Take 1 capsule (100 mg total) by mouth 3 (three) times daily as needed for cough. 30 capsule 0  . fluticasone (FLONASE) 50 MCG/ACT nasal spray Place 1 spray into both nostrils 2 (two) times daily as needed. Reported on 08/22/2015    . HYDROcodone-acetaminophen (NORCO/VICODIN) 5-325 MG per tablet Take 1 tablet by mouth every 6 (six) hours as needed for moderate pain. Reported on 08/15/2015    . levothyroxine (SYNTHROID, LEVOTHROID) 75 MCG tablet Take 75 mcg by mouth daily before breakfast.     . memantine (NAMENDA XR) 28 MG CP24 24 hr capsule Take 1 capsule (28 mg total) by mouth daily. 30 capsule 7  . Memantine HCl-Donepezil HCl (NAMZARIC) 28-10 MG CP24 Take 28 mg by mouth every morning. 30 capsule 5  . methylcellulose (ARTIFICIAL TEARS) 1 % ophthalmic solution Place 1 drop into both eyes 4 (four) times daily.     . traMADol (ULTRAM) 50 MG tablet Take 50 mg by mouth every 8 (eight) hours as needed (pain). Reported on 08/15/2015    . triamcinolone cream (KENALOG) 0.1 % Apply 1 application topically 3 (three) times  daily. 30 g 0   No current facility-administered medications for this visit.      Past Medical History:  Diagnosis Date  . Allergy   . Arthritis   . Asthma   . Colon polyps   . Complete heart block -intermittent    a. 11/2012 s/p SJM Accent DR RF dc ppm.  . Depression   . Environmental allergies   . Hyperlipidemia   . Memory retention disorder 03/22/2013  . Osteoarthritis    hands  . Thyroid disease     ROS:   All systems reviewed and negative except as noted in the HPI.   Past Surgical History:  Procedure Laterality Date  . APPENDECTOMY  1965  . CERVICAL POLYPECTOMY  1996  . COLONOSCOPY  2007   adenomatous polyps  . EYE SURGERY    . HAND SURGERY    . HIP ARTHROPLASTY Right 01/03/2015   Procedure: ARTHROPLASTY BIPOLAR HIP (HEMIARTHROPLASTY);  Surgeon: [name], MD;  Location: [facility];  Service: Orthopedics;  Laterality: Right;  . INSERT / REPLACE / REMOVE PACEMAKER  12/02/2012    Dr Lovena Le  . KNEE SURGERY    . ovarian cystectomy  1965  . PERMANENT PACEMAKER INSERTION N/A 12/02/2012   Procedure: PERMANENT PACEMAKER INSERTION;  Surgeon: Evans Lance, MD;  Location: [facility] CATH LAB;  Service: Cardiovascular;  Laterality: N/A;  . SALPINGOOPHORECTOMY     right  . tumor removed  1985   abdomen     Family History  Problem Relation Age of Onset  . Colon cancer Father   . Colon polyps Brother   . Diabetes Brother     maternal aunt  . Coronary artery disease Brother      Social History   Social History  . Marital status: Married    Spouse name: [name]  . Number of children: 3  . Years of education: College   Occupational History  . elem. school teacher     retired   Social History Main Topics  . Smoking status: Never Smoker  . Smokeless tobacco: Never Used  . Alcohol use 0.0 oz/week     Comment: occas. which is seldom  . Drug use: No  . Sexual activity: Not on file   Other Topics Concern  . Not on file   Social History Narrative   Patient has  one daughter, two adopted children(son,daughter).   Patient lives at home with spouse [name]).   Patient is retired.   Patient has a college education.   Patient is right-handed.   Patient drinks very little caffeine.     BP (!) 98/58   Pulse 79   Ht 5\' 8"  (1.727 m)   Wt 141 lb (64 kg)   SpO2 96%   BMI 21.44 kg/m   Physical Exam:  Well appearing 81 year old woman, NAD HEENT: Unremarkable Neck:  6 cm JVD, no thyromegally Back:  No CVA tenderness Lungs:  Clear with no wheezes, rales, or rhonchi. Well healed PPM incision HEART:  Regular rate rhythm, no murmurs, no rubs, no clicks Abd:  soft, positive bowel sounds, no organomegally, no rebound, no guarding Ext:  2 plus pulses, no edema, no cyanosis, no clubbing Skin:  No rashes no nodules Neuro:  CN II through XII intact, motor grossly intact, no short term memory. Does not know her dogs names.   DEVICE  Normal device function.  See PaceArt for details.   Assess/Plan: 1. Sinus node dysfunction - she is asymptomatic.  2. PPM - her [employer]. Jude device is working normally. Will recheck in several months. 3. Dyslipidemia - no change in her meds.  Mikle Bosworth.D.

## 2016-05-25 NOTE — Patient Instructions (Addendum)
Medication Instructions:  Your physician recommends that you continue on your current medications as directed. Please refer to the Current Medication list given to you today.   Labwork: None Ordered   Testing/Procedures: None Ordered   Follow-Up:  Your physician wants you to follow-up in: 1 year with Dr. Lovena Le. You will receive a reminder letter in the mail two months in advance. If you don't receive a letter, please call our office to schedule the follow-up appointment.  Remote monitoring is used to monitor your Pacemaker from home. This monitoring reduces the number of office visits required to check your device to one time per year. It allows [place] to keep an eye on the functioning of your device to ensure it is working properly. You are scheduled for a device check from home on 08/24/16. You may send your transmission at any time that day. If you have a wireless device, the transmission will be sent automatically. After your physician reviews your transmission, you will receive a postcard with your next transmission date.    Any Other Special Instructions Will Be Listed Below (If Applicable).     If you need a refill on your cardiac medications before your next appointment, please call your pharmacy.

## 2016-06-03 ENCOUNTER — Ambulatory Visit (INDEPENDENT_AMBULATORY_CARE_PROVIDER_SITE_OTHER): Payer: Medicare Other | Admitting: Physician Assistant

## 2016-06-03 ENCOUNTER — Ambulatory Visit (INDEPENDENT_AMBULATORY_CARE_PROVIDER_SITE_OTHER): Payer: Medicare Other

## 2016-06-03 VITALS — BP 110/68 | HR 60 | Temp 99.0°F | Resp 18 | Ht 68.0 in | Wt 144.0 lb

## 2016-06-03 DIAGNOSIS — N390 Urinary tract infection, site not specified: Secondary | ICD-10-CM

## 2016-06-03 DIAGNOSIS — R1084 Generalized abdominal pain: Secondary | ICD-10-CM

## 2016-06-03 DIAGNOSIS — R319 Hematuria, unspecified: Secondary | ICD-10-CM

## 2016-06-03 LAB — POCT URINALYSIS DIP (MANUAL ENTRY)
BILIRUBIN UA: NEGATIVE
BILIRUBIN UA: NEGATIVE
Glucose, UA: NEGATIVE
NITRITE UA: NEGATIVE
PH UA: 7.5
Spec Grav, UA: 1.02
Urobilinogen, UA: 0.2

## 2016-06-03 LAB — POCT CBC
Granulocyte percent: 78.4 %G (ref 37–80)
HCT, POC: 38.1 % (ref 37.7–47.9)
HEMOGLOBIN: 13.6 g/dL (ref 12.2–16.2)
Lymph, poc: 1.1 (ref 0.6–3.4)
MCH: 31.9 pg — AB (ref 27–31.2)
MCHC: 35.6 g/dL — AB (ref 31.8–35.4)
MCV: 89.6 fL (ref 80–97)
MID (cbc): 0.4 (ref 0–0.9)
MPV: 6 fL (ref 0–99.8)
PLATELET COUNT, POC: 237 10*3/uL (ref 142–424)
POC Granulocyte: 5.3 (ref 2–6.9)
POC LYMPH PERCENT: 15.7 %L (ref 10–50)
POC MID %: 5.9 % (ref 0–12)
RBC: 4.25 M/uL (ref 4.04–5.48)
RDW, POC: 13.3 %
WBC: 6.7 10*3/uL (ref 4.6–10.2)

## 2016-06-03 LAB — POC MICROSCOPIC URINALYSIS (UMFC): Mucus: ABSENT

## 2016-06-03 MED ORDER — CEPHALEXIN 500 MG PO CAPS: 500.0000 mg | ORAL_CAPSULE | Freq: Two times a day (BID) | ORAL | 0 refills | Status: DC

## 2016-06-03 NOTE — Progress Notes (Signed)
[name]  MRN: [id number] DOB: [birth year]  Subjective:  [name] is a 81 y.o. female seen in office today for a chief complaint of generalized stomach pain this morning 3 hours prior to arrival for about one hour. Had associated gas and abdominal distenstion.  Denies constipation, diarrhea, nausea, and vomiting. She has tried milk of magnesia with relief. Her last bowel movement was this morning and notes it was normal consistency, notes she has one every few days. Her last meal today was a banana and the pain was not worsened with eating.  She drinks lots of water daily. Notes her diet consists of chicken, vegetables, and fruits. She has not changed her diet. No alcohol use. She is up to date on her colonoscopy. Denies history of gallbladder disease or diverticulitis. Had appendectomy in 1965.  Of note, she is not having abdominal pain right now.   Pt is a poor historian due to Alzheimer's disease.   Review of Systems  Constitutional: Negative for appetite change, chills, diaphoresis, fatigue and fever.  Respiratory: Positive for cough. Negative for shortness of breath.   Cardiovascular: Negative for chest pain and palpitations.  Gastrointestinal: Negative for blood in stool and rectal pain.  Genitourinary: Positive for frequency (she goes often due to high water consumption). Negative for difficulty urinating, dysuria, hematuria, urgency and vaginal bleeding.  Neurological: Negative for dizziness and headaches.    Patient Active Problem List   Diagnosis Date Noted  . Alzheimer's disease 03/19/2016  . Paroxysmal atrial fibrillation ([place]) 07/10/2015  . Atrial fibrillation ([place]) 04/29/2015  . Closed right hip fracture ([place]) 01/02/2015  . Weight loss, unintentional 02/19/2014  . Dementia arising in the senium and presenium 01/30/2014  . 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 Memory loss 07/07/2013  . Memory retention disorder  03/22/2013  . Pacemaker 03/09/2013  . Complete heart block -intermittent   . Syncope 11/18/2012  . Personal history of adenomatous colonic polyps 11/12/2011  . History of TIA (transient ischemic attack) 09/19/2011  . Hypothyroidism 01/13/2011  . Hyperlipidemia 01/13/2011  . First degree AV block 01/13/2011  . Gait disturbance 01/13/2011    Current Outpatient Prescriptions on File Prior to Visit  Medication Sig Dispense Refill  . apixaban (ELIQUIS) 5 MG TABS tablet Take 1 tablet (5 mg total) by mouth 2 (two) times daily. 60 tablet 11  . levothyroxine (SYNTHROID, LEVOTHROID) 75 MCG tablet Take 75 mcg by mouth daily before breakfast.     . memantine (NAMENDA XR) 28 MG CP24 24 hr capsule Take 1 capsule (28 mg total) by mouth daily. 30 capsule 7  . Memantine HCl-Donepezil HCl (NAMZARIC) 28-10 MG CP24 Take 28 mg by mouth every morning. 30 capsule 5  . methylcellulose (ARTIFICIAL TEARS) 1 % ophthalmic solution Place 1 drop into both eyes 4 (four) times daily.     Marland Kitchen triamcinolone cream (KENALOG) 0.1 % Apply 1 application topically 3 (three) times daily. 30 g 0  . HYDROcodone-acetaminophen (NORCO/VICODIN) 5-325 MG per tablet Take 1 tablet by mouth every 6 (six) hours as needed for moderate pain. Reported on 08/15/2015    . traMADol (ULTRAM) 50 MG tablet Take 50 mg by mouth every 8 (eight) hours as needed (pain). Reported on 08/15/2015     No current facility-administered medications on file prior to visit.     Allergies  Allergen Reactions  . Sulfa Antibiotics Diarrhea and Rash  . Sulfasalazine Diarrhea and Rash  Objective:  BP 110/68 (BP Location: Right Arm, Patient Position: Sitting, Cuff Size: Small)   Pulse 60   Temp 99 F (37.2 C) (Oral)   Resp 18   Ht 5\' 8"  (1.727 m)   Wt 144 lb (65.3 kg)   SpO2 97%   BMI 21.90 kg/m   Physical Exam  Constitutional: She is oriented to person, place, and time and well-developed, well-nourished, and in no distress.  HENT:  Head:  Normocephalic and atraumatic.  Mouth/Throat: Uvula is midline, oropharynx is clear and moist and mucous membranes are normal.  Eyes: Conjunctivae are normal.  Neck: Normal range of motion.  Cardiovascular: Normal rate, regular rhythm and normal heart sounds.   Pulmonary/Chest: Effort normal.  Abdominal: Soft. Normal appearance. Bowel sounds are hyperactive. There is no tenderness. There is no guarding, no CVA tenderness, no tenderness at McBurney's point and negative Murphy's sign.  Neurological: She is alert and oriented to person, place, and time. Gait normal.  Skin: Skin is warm and dry.  Psychiatric: Affect normal.  Vitals reviewed.     Results for orders placed or performed in visit on 06/03/16 (from the past 24 hour(s))  POCT CBC     Status: Abnormal   Collection Time: 06/03/16 11:31 AM  Result Value Ref Range   WBC 6.7 4.6 - 10.2 K/uL   Lymph, poc 1.1 0.6 - 3.4   POC LYMPH PERCENT 15.7 10 - 50 %L   MID (cbc) 0.4 0 - 0.9   POC MID % 5.9 0 - 12 %M   POC Granulocyte 5.3 2 - 6.9   Granulocyte percent 78.4 37 - 80 %G   RBC 4.25 4.04 - 5.48 M/uL   Hemoglobin 13.6 12.2 - 16.2 g/dL   HCT, POC 38.1 37.7 - 47.9 %   MCV 89.6 80 - 97 fL   MCH, POC 31.9 (A) 27 - 31.2 pg   MCHC 35.6 (A) 31.8 - 35.4 g/dL   RDW, POC 13.3 %   Platelet Count, POC 237 142 - 424 K/uL   MPV 6.0 0 - 99.8 fL  POCT urinalysis dipstick     Status: Abnormal   Collection Time: 06/03/16 11:38 AM  Result Value Ref Range   Color, UA yellow yellow   Clarity, UA clear clear   Glucose, UA negative negative   Bilirubin, UA negative negative   Ketones, POC UA negative negative   Spec Grav, UA 1.020    Blood, UA moderate (A) negative   pH, UA 7.5    Protein Ur, POC =30 (A) negative   Urobilinogen, UA 0.2    Nitrite, UA Negative Negative   Leukocytes, UA Trace (A) Negative  POCT Microscopic Urinalysis ([facility])     Status: Abnormal   Collection Time: 06/03/16 11:57 AM  Result Value Ref Range   WBC,UR,HPF,POC  Moderate (A) None WBC/hpf   RBC,UR,HPF,POC Few (A) None RBC/hpf   Bacteria None None, Too numerous to count   Mucus Absent Absent   Epithelial Cells, UR Per Microscopy Few (A) None, Too numerous to count cells/hpf   Dg Abd 2 Views  Result Date: 06/03/2016 CLINICAL DATA:  Abdominal pain. EXAM: ABDOMEN - 2 VIEW COMPARISON:  None. FINDINGS: No abnormal small bowel dilatation identified. Air-filled loops of large bowel are identified. There is no evidence of free air. No radio-opaque calculi or other significant radiographic abnormality is seen. Mild scoliosis deformity is noted involving the thoracic and lumbar spine. IMPRESSION: 1. Nonobstructive bowel gas pattern. Electronically Signed   By:  Kerby Moors M.D.   On: 06/03/2016 11:24   Assessment and Plan :  This case was precepted with Dr. Mingo Amber  1. Generalized abdominal pain -Resolved, labs indicate underlying UTI but this may also be an incidental finding. Will treat accordingly and pt instructed that if she experiences any worsening pain, fever, chills,  inability to have a bowel movement, or other concerning symptoms go to ER immediately.   - POCT CBC - POCT urinalysis dipstick - POCT Microscopic Urinalysis ([facility]) - DG Abd 2 Views; Future - Urine culture  2. Urinary tract infection with hematuria, site unspecified - cephALEXin (KEFLEX) 500 MG capsule; Take 1 capsule (500 mg total) by mouth 2 (two) times daily.  Dispense: 20 capsule; Refill: 0   Tenna Delaine PA-C  [facility] 06/03/2016 12:00 PM

## 2016-06-03 NOTE — Patient Instructions (Addendum)
Take the antibiotic as prescribed. Make sure you drink plenty of water in the meantime.   If you start to experience any worsening pain, fever, chills,  inability to have a bowel movement, or other concerning symptoms go to ER immediately.   Acute Urinary Retention, Female Urinary retention means you are unable to pee completely or at all (empty your bladder). Follow these instructions at home:  Drink enough fluids to keep your pee (urine) clear or pale yellow.  If you are sent home with a tube that drains the bladder (catheter), there will be a drainage bag attached to it. There are two types of bags. One is big that you can wear at night without having to empty it. One is smaller and needs to be emptied more often.  Keep the drainage bag emptied.  Keep the drainage bag lower than the tube.  Only take medicine as told by your doctor. Contact a doctor if:  You have a low-grade fever.  You have spasms or you are leaking pee when you have spasms. Get help right away if:  You have chills or a fever.  Your catheter stops draining pee.  Your catheter falls out.  You have increased bleeding that does not stop after you have rested and increased the amount of fluids you had been drinking. This information is not intended to replace advice given to you by your health care provider. Make sure you discuss any questions you have with your health care provider. Document Released: 11/25/2007 Document Revised: 11/14/2015 Document Reviewed: 11/17/2012 Elsevier Interactive Patient Education  2017 [employer].    IF you received an x-ray today, you will receive an invoice from [facility]. Please contact [facility] at [phone] with questions or concerns regarding your invoice.   IF you received labwork today, you will receive an invoice from [employer]. Please contact [facility] at [phone] with questions or concerns regarding your invoice.    Our billing staff will not be able to assist you with questions regarding bills from these companies.  You will be contacted with the lab results as soon as they are available. The fastest way to get your results is to activate your My Chart account. Instructions are located on the last page of this paperwork. If you have not heard from [place] regarding the results in 2 weeks, please contact this office.

## 2016-06-04 ENCOUNTER — Other Ambulatory Visit: Payer: Self-pay | Admitting: Internal Medicine

## 2016-06-04 MED ORDER — BENZONATATE 100 MG PO CAPS: 100.0000 mg | ORAL_CAPSULE | Freq: Three times a day (TID) | ORAL | 0 refills | Status: DC

## 2016-06-04 NOTE — Telephone Encounter (Signed)
Patients husband called requesting tessalon refill.  He says patient has a cough and so does he.  He says they went to urgent care, but this was not treated.  Offered to have patient come in, but they declined.  Verbal per Dr Renold Genta medication refilled this time and if it does not get better they will need appointment.

## 2016-06-05 LAB — URINE CULTURE

## 2016-06-11 ENCOUNTER — Encounter: Payer: Self-pay | Admitting: Internal Medicine

## 2016-06-11 ENCOUNTER — Ambulatory Visit (INDEPENDENT_AMBULATORY_CARE_PROVIDER_SITE_OTHER): Payer: Medicare Other | Admitting: Internal Medicine

## 2016-06-11 VITALS — BP 98/70 | HR 69 | Temp 98.0°F | Wt 143.0 lb

## 2016-06-11 DIAGNOSIS — J069 Acute upper respiratory infection, unspecified: Secondary | ICD-10-CM | POA: Diagnosis not present

## 2016-06-11 DIAGNOSIS — R413 Other amnesia: Secondary | ICD-10-CM | POA: Diagnosis not present

## 2016-06-11 MED ORDER — AZITHROMYCIN 250 MG PO TABS: ORAL_TABLET | ORAL | 0 refills | Status: DC

## 2016-06-11 NOTE — Progress Notes (Signed)
   Subjective:    Patient ID: [name], female    DOB: [birth year], 81 y.o.   MRN: [id number]  HPI Patient was seen in urgent care recently. She has several issues going on including a respiratory infection, constipation and a UTI. She was treated with Keflex. UR eye has not improved. Has cough that is persistent. No documented fever. She is a poor historian. She has dementia.    Review of Systems     Objective:   Physical Exam Skin warm and dry. Nodes none. TMs and pharynx are clear. Neck is supple. Chest clear.       Assessment & Plan:  Acute URI-this is become protracted  Plan: Zithromax Z-Pak take 2 tablets day one followed by 1 tablet days 2 through 5. Tessalon Perles 100 mg 3 times daily as needed for cough.

## 2016-06-11 NOTE — Patient Instructions (Signed)
Zithromax Z-PAK take 2 tablets day one followed by 1 tablet days 2 through 5. Tessalon Perles 100 mg 3 times daily as needed for cough.

## 2016-06-16 IMAGING — CR DG FEMUR 2+V*R*
3 series · 3 of 3 positions shown · non-contrast
Comparison: Pelvis and hip 01/02/2015

CLINICAL DATA: Right femoral neck fracture. Hip pain for 2 weeks.
No injury.

EXAM:
RIGHT FEMUR 2 VIEWS

[view not recorded (1 of 3)]
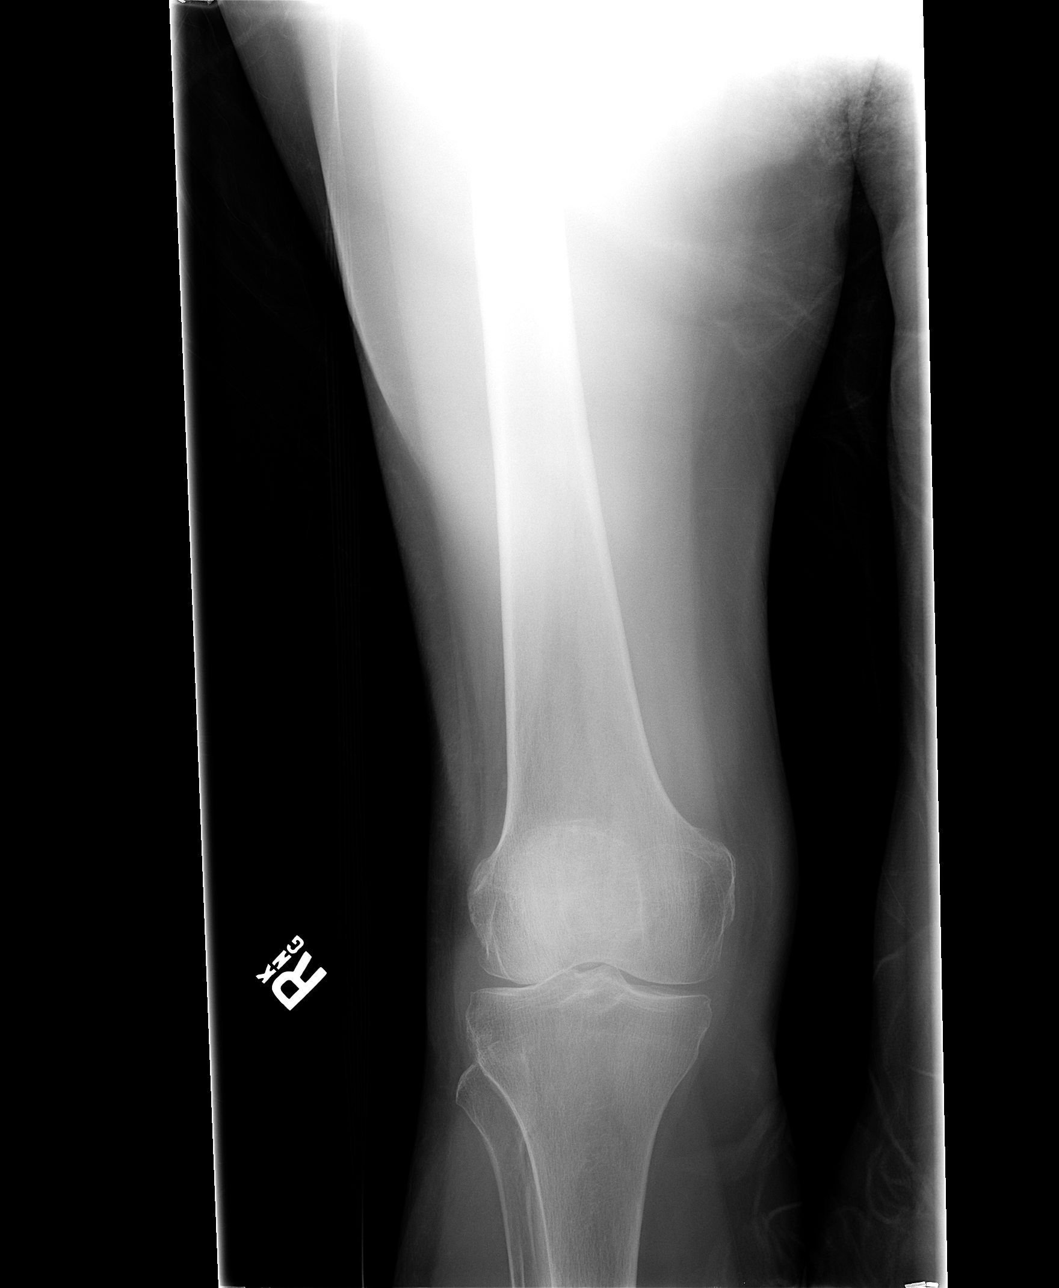

[view not recorded (2 of 3)]
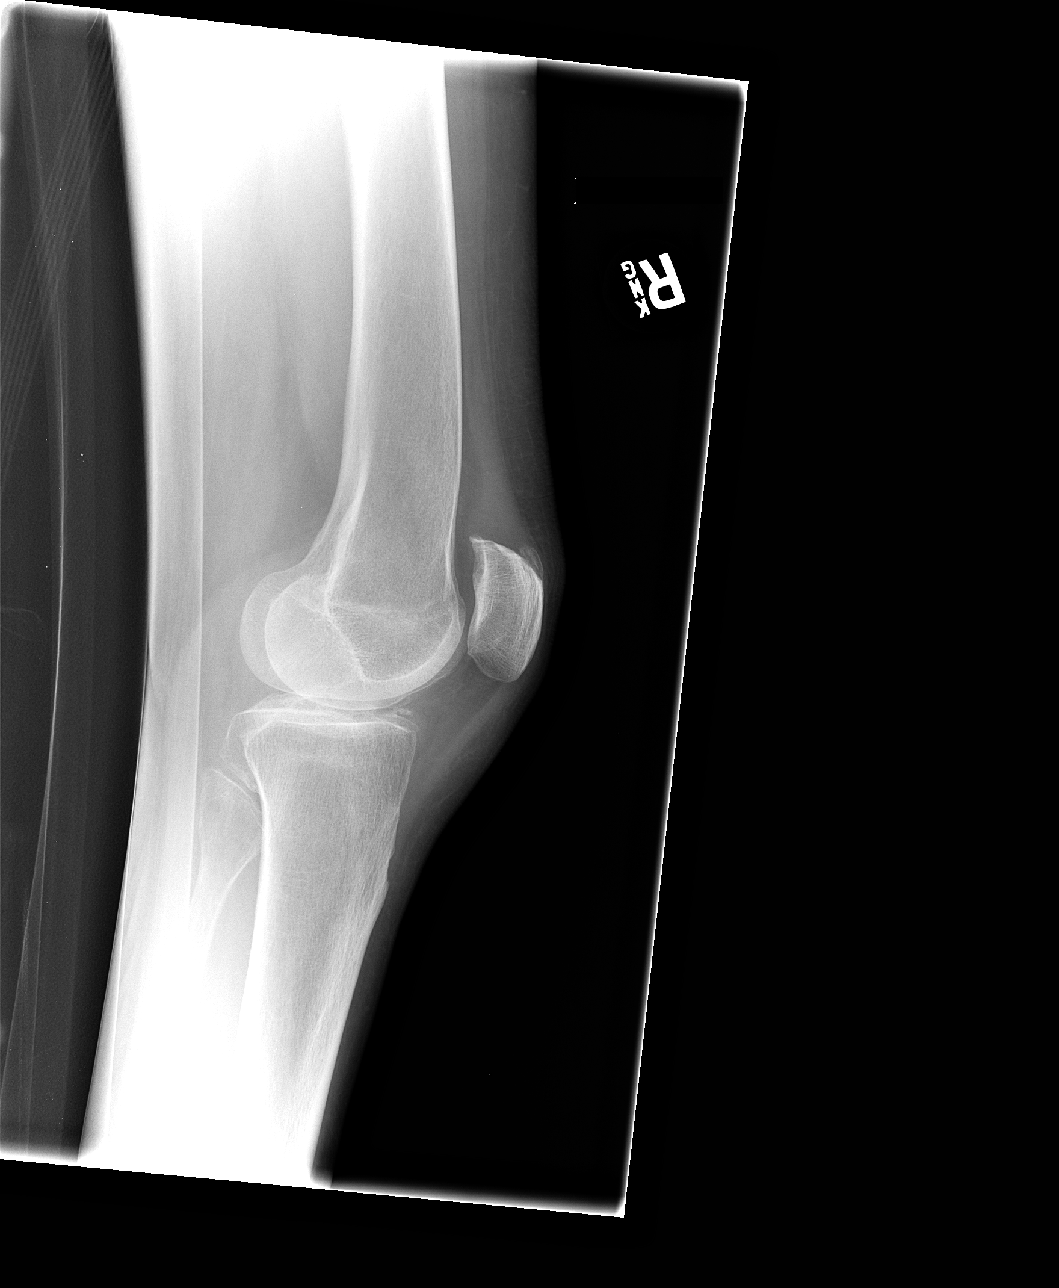

[view not recorded (3 of 3)]
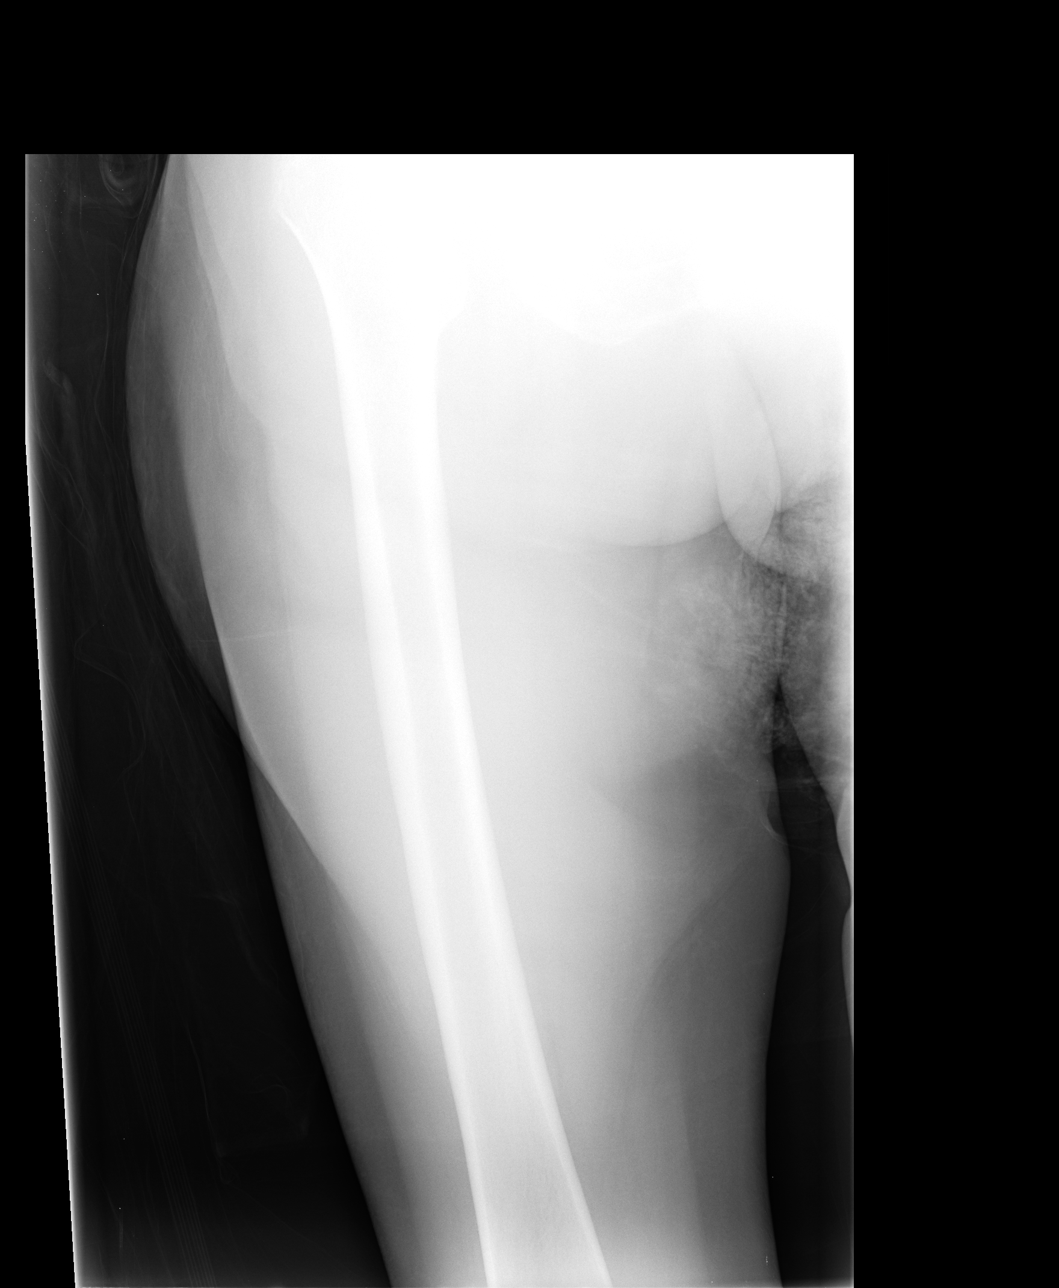

[3 of 3 positions shown; findings below may reference images not displayed]

FINDINGS: Transverse fracture of the right femoral neck with varus angulation
again demonstrated. No change in position since previous study. The
distal right femur appears intact. No additional fractures or focal
bone lesions are demonstrated. Soft tissues are unremarkable.
IMPRESSION: Fracture right femoral neck again demonstrated. Midshaft and distal
right femur otherwise unremarkable.

## 2016-06-16 IMAGING — CR DG CHEST 2V
2 series · 2 of 2 positions shown · non-contrast
Comparison: Radiograph 10/02/2014

CLINICAL DATA: Right hip pain for 2 weeks

EXAM:
CHEST  2 VIEW

[x chest ap]
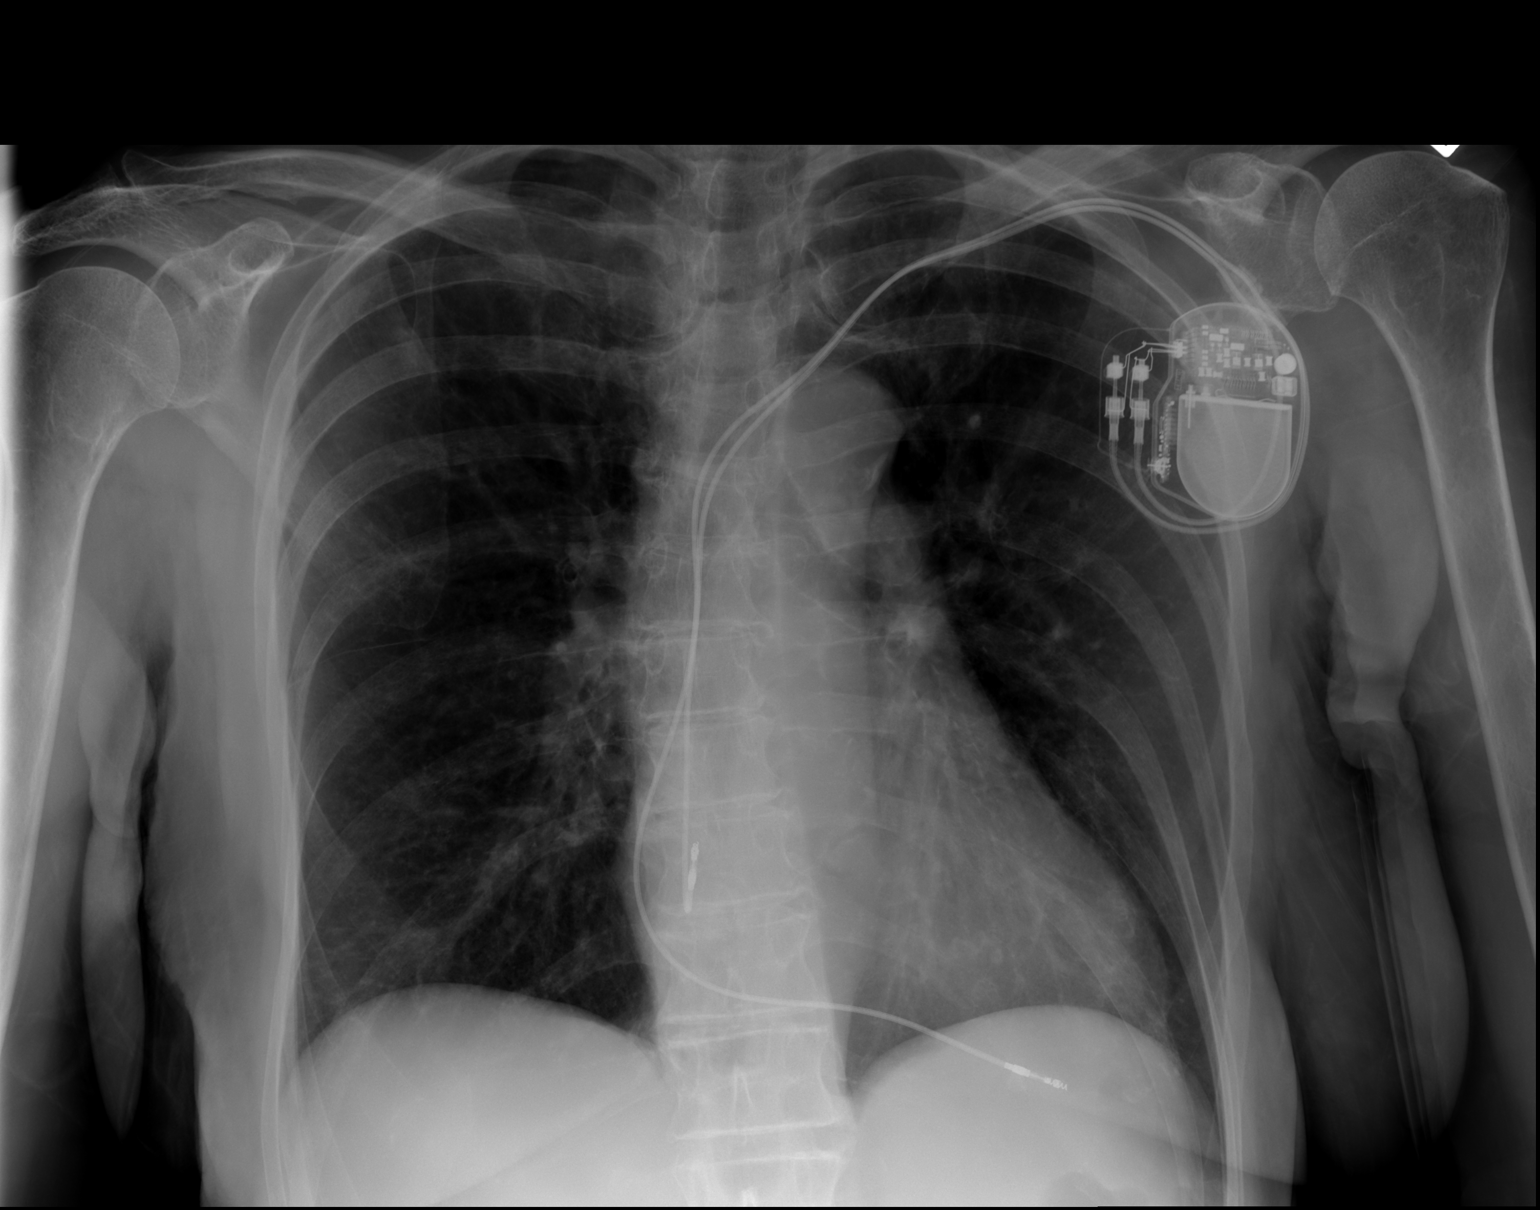

[w chest lat]
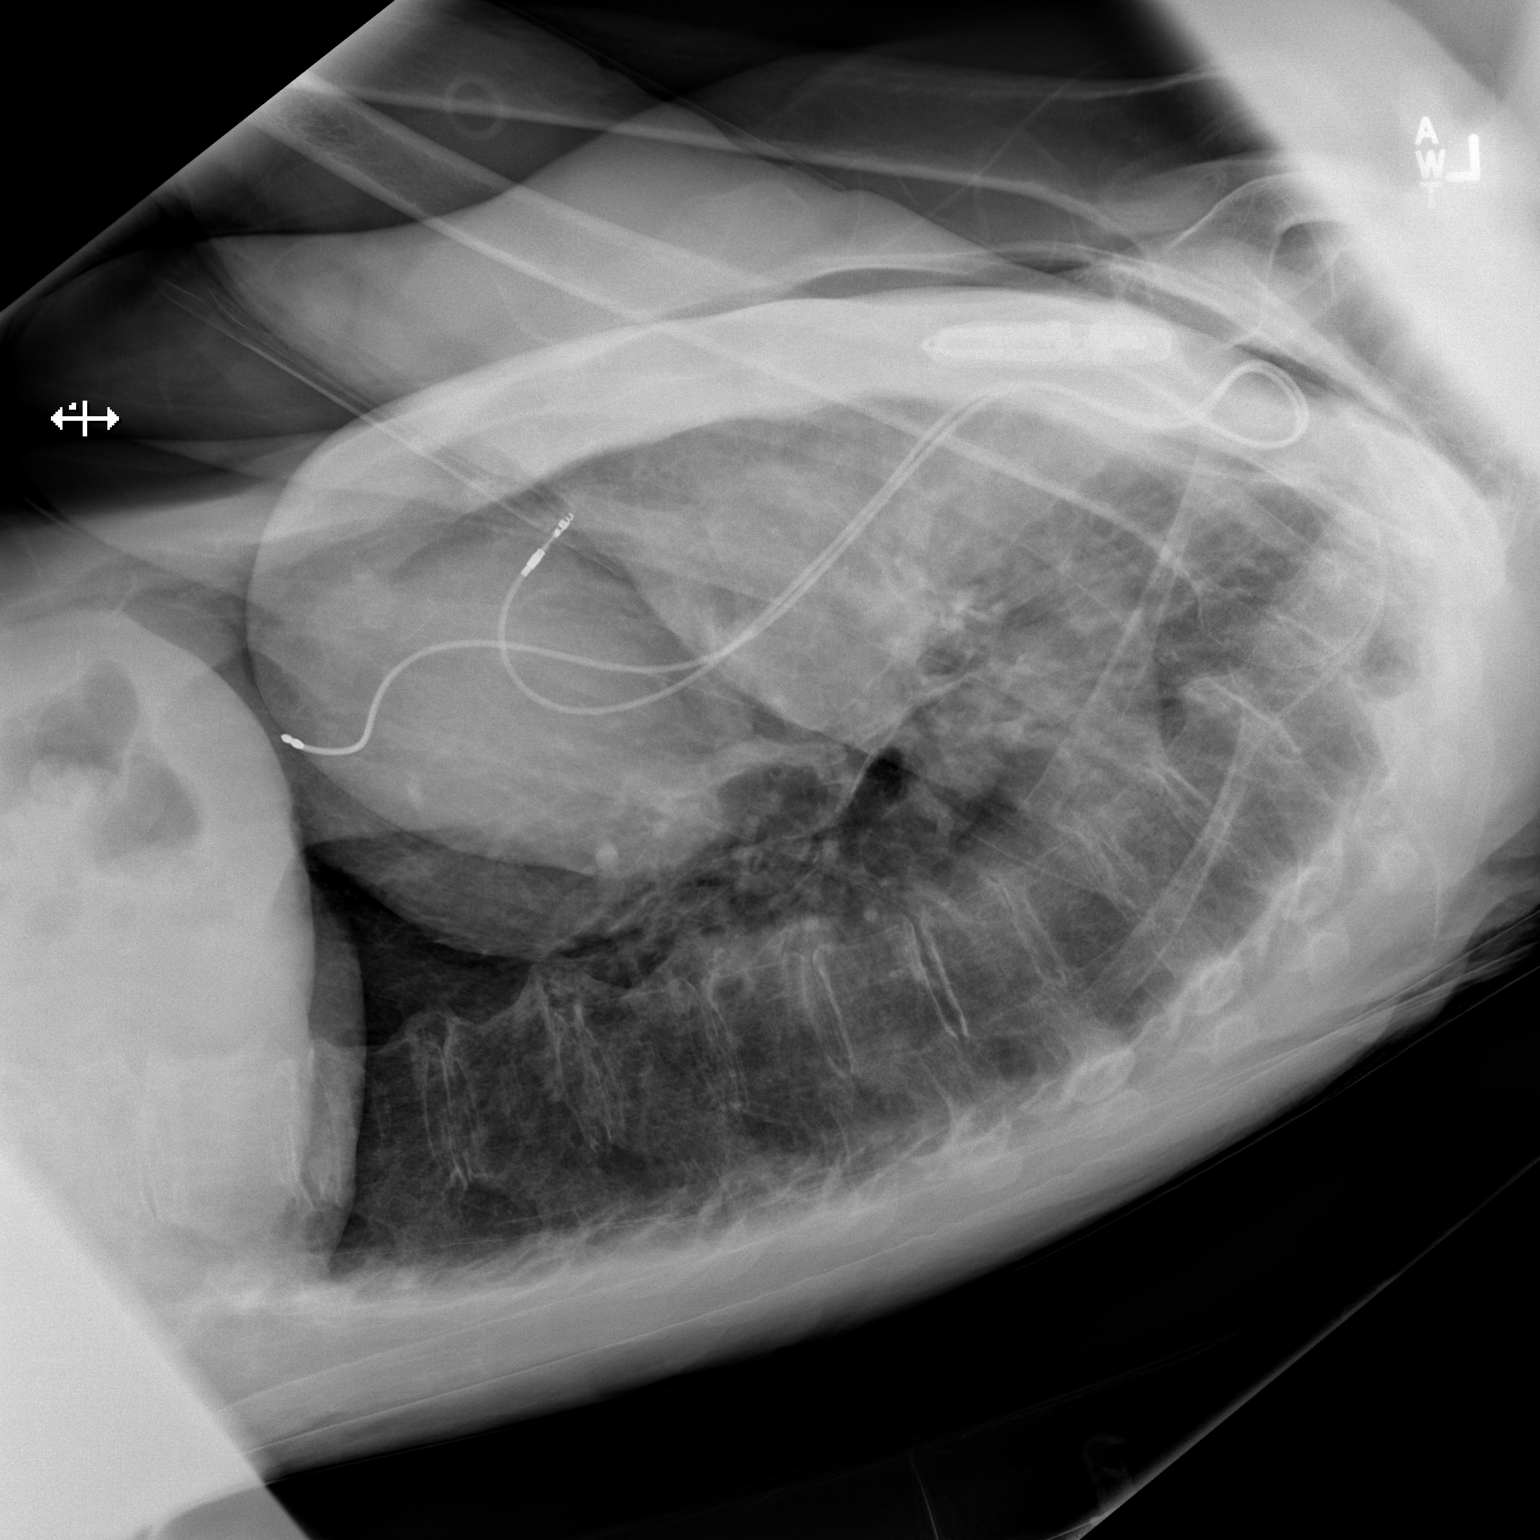

[2 of 2 positions shown; findings below may reference images not displayed]

FINDINGS: Left-sided pacemaker overlies normal cardiac silhouette. No
effusion, infiltrate, pneumothorax. Lungs are hyperinflated. Chronic
bronchitic markings.
IMPRESSION: 1. No acute cardiopulmonary findings
2. Hyperinflated lungs with chronic bronchitic markings.

## 2016-07-02 ENCOUNTER — Ambulatory Visit (INDEPENDENT_AMBULATORY_CARE_PROVIDER_SITE_OTHER): Payer: Medicare Other | Admitting: Internal Medicine

## 2016-07-02 ENCOUNTER — Encounter: Payer: Self-pay | Admitting: Internal Medicine

## 2016-07-02 VITALS — BP 102/60 | HR 77 | Temp 97.8°F | Wt 146.0 lb

## 2016-07-02 DIAGNOSIS — M79645 Pain in left finger(s): Secondary | ICD-10-CM

## 2016-07-02 DIAGNOSIS — J069 Acute upper respiratory infection, unspecified: Secondary | ICD-10-CM

## 2016-07-02 DIAGNOSIS — M79671 Pain in right foot: Secondary | ICD-10-CM

## 2016-07-02 MED ORDER — BENZONATATE 100 MG PO CAPS: 100.0000 mg | ORAL_CAPSULE | Freq: Three times a day (TID) | ORAL | 0 refills | Status: DC

## 2016-07-02 MED ORDER — AZITHROMYCIN 250 MG PO TABS: ORAL_TABLET | ORAL | 0 refills | Status: DC

## 2016-07-02 NOTE — Patient Instructions (Signed)
Refill Zithromax and Tessalon Perles. Tylenol as needed for musculoskeletal pain.

## 2016-07-02 NOTE — Progress Notes (Signed)
   Subjective:    Patient ID: [name], female    DOB: 1934-08-21, 82 y.o.   MRN: [id number]  HPI  82 year old female with history of dementia.  Hx right femoral neck fracture requiring surgical repair July 2016.  She has a history of pacemaker and paroxysmal atrial fibrillation and is on Eliquis.  C/o left thumb pain. Complained of right foot pain recently. She is here for this today. Husband says she really complained about it for few hours one morning. He does not know of any injury and she can't recall.  C/o persistent cough. Was treated in late December for respiratory infection with a Zithromax Z-Pak and Tessalon Perles.    Review of Systems see above     Objective:   Physical Exam Hacking cough in office. TMs are clear. Pharynx is clear. Neck is supple without adenopathy. Chest clear. Has bony prominence left thumb MCP joint. No increased warmth.  Has bony prominence dorsum of left foot. No redness and foot. No increased warmth.       Assessment & Plan:   Prominence dorsum of foot. No redness, no pain.Suspect arthritis in foot. Do not see an acute injury or gout. Do not feel x-ray is indicated.  Left thumb osteoarthritis-is on Eliquis. Can take Tylenol for pain.  Protracted URI. Refill Zithromax Z-Pak and Tessalon Perles

## 2016-07-08 ENCOUNTER — Ambulatory Visit: Payer: Medicare Other | Admitting: Cardiovascular Disease

## 2016-07-20 ENCOUNTER — Other Ambulatory Visit: Payer: Self-pay | Admitting: *Deleted

## 2016-07-20 NOTE — Telephone Encounter (Signed)
Called the pharmacy regarding pt Eliquis & they stated the pt had called in an old prescription number & they have a prescription for the Eliquis on file that was sent in on 05/26/16. Called the pt to inform her that the Pharmacy has the prescription and that they will fill. Advised husband/wife to call to ensure that the med is ready before driving to pick up & they verbalized understanding.

## 2016-07-20 NOTE — Telephone Encounter (Signed)
Per msg left on refill vm stating that they had contacted the pharmacy requesting a refill and was told to call the office. Patient is almost out and a call back at [phone] was requested.

## 2016-07-22 ENCOUNTER — Ambulatory Visit (INDEPENDENT_AMBULATORY_CARE_PROVIDER_SITE_OTHER): Payer: Medicare Other | Admitting: Cardiovascular Disease

## 2016-07-22 ENCOUNTER — Encounter: Payer: Self-pay | Admitting: Cardiovascular Disease

## 2016-07-22 VITALS — BP 126/72 | HR 64 | Ht 68.0 in | Wt 146.0 lb

## 2016-07-22 DIAGNOSIS — I480 Paroxysmal atrial fibrillation: Secondary | ICD-10-CM | POA: Diagnosis not present

## 2016-07-22 NOTE — Patient Instructions (Signed)
Medication Instructions:  Your physician recommends that you continue on your current medications as directed. Please refer to the Current Medication list given to you today.   Labwork: Your physician recommends that you return for lab work in: 1 year (bmet, cbc)   Testing/Procedures: None ordered  Follow-Up: Your physician wants you to follow-up in: 1 year with Dr.Nahser You will receive a reminder letter in the mail two months in advance. If you don't receive a letter, please call our office to schedule the follow-up appointment.   Any Other Special Instructions Will Be Listed Below (If Applicable).     If you need a refill on your cardiac medications before your next appointment, please call your pharmacy.

## 2016-07-22 NOTE — Progress Notes (Signed)
Cardiology Office Note   Date:  07/22/2016   ID:  [name], DOB June 22, 1935, MRN [id number]  PCP:  Elby Showers, MD  Cardiologist:  Previous Darlin Coco MD , now [facility]   Chief Complaint  Patient presents with  . Follow-up    PAF    Problem List 1. Paroxysmal atrial fibrillation 2. Hyperlipidemia 3. 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 Hypothyroidism-     History of Present Illness: [name] is a 82 y.o. female who presents for a six-month follow-up visit  This 82 year old woman is seen for a scheduled followup office visit. She has a history of intermittent high-grade AV block and has a functioning St. Jude dual-chamber pacemaker. She is followed for this by Dr. Lovena Le. She has a history of hypercholesterolemia. She has a history of osteoarthritis. She has also had some memory issues and is followed by Dr. Brett Fairy. Since last visit she has had no new cardiac symptoms. She has not had any dizziness or syncope. No chest pain. She has had a past history of unintentional weight loss but since her last visit she has had no change in weight. The patient had a chest x-ray on 02/19/14 which showed no abnormality. She complains of feeling tired and worn out. She has had extensive lab work by her PCP which did not show any evidence of malignancy. She had a CT scan of the abdomen and pelvis on 08/08/14 which did not show any abdominal aortic aneurysm or other cause of weight loss. She complains of dyspnea and not being able to get a complete breath. Impression The patient developed a fractured right hip. Her husband and she notes that she never fell. Her hip just started to hurt. X-rays confirmed a fracture and Dr. Rhona Raider operated on her on 01/03/15. She is walking with a cane. Her recent pacemaker interrogation revealed that she was having episodes of paroxysmal atrial fibrillation 1% of the time.  This does put her at increased risk for thromboembolic  disease.  January 07, 2016:  Was seen with her husband today . She has some dementia and husband answered most of the detailed questions .   Was started on Eliquis for PAF by Dr. Mare Ferrari at her last visit Tolerating it well.  Sees Dr. Chalmers Cater for hypothyroidism   [name], [name] She has a history of paroxysmal atrial fib . She has dementia and most of the questions were answered by her husband. Has a slight cough No bleeding with the Eliquis   Past Medical History:  Diagnosis Date  . Allergy   . Arthritis   . Asthma   . Colon polyps   . Complete heart block -intermittent    a. 11/2012 s/p SJM Accent DR RF dc ppm.  . Depression   . Environmental allergies   . Hyperlipidemia   . Memory retention disorder 03/22/2013  . Osteoarthritis    hands  . Thyroid disease     Past Surgical History:  Procedure Laterality Date  . APPENDECTOMY  1965  . CERVICAL POLYPECTOMY  1996  . COLONOSCOPY  2007   adenomatous polyps  . EYE SURGERY    . HAND SURGERY    . HIP ARTHROPLASTY Right 01/03/2015   Procedure: ARTHROPLASTY BIPOLAR HIP (HEMIARTHROPLASTY);  Surgeon: [name], MD;  Location: [facility];  Service: Orthopedics;  Laterality: Right;  . INSERT / REPLACE / REMOVE PACEMAKER  12/02/2012  Dr Lovena Le  . KNEE SURGERY    . ovarian cystectomy  1965  . PERMANENT PACEMAKER INSERTION N/A 12/02/2012   Procedure: PERMANENT PACEMAKER INSERTION;  Surgeon: Evans Lance, MD;  Location: [facility] CATH LAB;  Service: Cardiovascular;  Laterality: N/A;  . SALPINGOOPHORECTOMY     right  . tumor removed  1985   abdomen     Current Outpatient Prescriptions  Medication Sig Dispense Refill  . alendronate (FOSAMAX) 70 MG tablet Take 70 mg by mouth once a week. Take with a full glass of water on an empty stomach.    Marland Kitchen apixaban (ELIQUIS) 5 MG TABS tablet Take 1 tablet (5 mg total) by mouth 2 (two) times daily. 60 tablet 11  . Cholecalciferol (VITAMIN D PO) Take  by mouth.    Marland Kitchen HYDROcodone-acetaminophen (NORCO/VICODIN) 5-325 MG per tablet Take 1 tablet by mouth every 6 (six) hours as needed for moderate pain. Reported on 08/15/2015    . levothyroxine (SYNTHROID, LEVOTHROID) 75 MCG tablet Take 75 mcg by mouth daily before breakfast.     . memantine (NAMENDA XR) 28 MG CP24 24 hr capsule Take 1 capsule (28 mg total) by mouth daily. 30 capsule 7  . methylcellulose (ARTIFICIAL TEARS) 1 % ophthalmic solution Place 1 drop into both eyes 4 (four) times daily.     . Omega-3 Fatty Acids (FISH OIL PO) Take by mouth.    . benzonatate (TESSALON) 100 MG capsule Take 1 capsule (100 mg total) by mouth 3 (three) times daily. (Patient not taking: Reported on 07/22/2016) 30 capsule 0   No current facility-administered medications for this visit.     Allergies:   Sulfa antibiotics and Sulfasalazine    Social History:  The patient  reports that she has never smoked. She has never used smokeless tobacco. She reports that she drinks alcohol. She reports that she does not use drugs.   Family History:  The patient's family history includes Colon cancer in her father; Colon polyps in her brother; Coronary artery disease in her brother; Diabetes in her brother.    ROS:  Please see the history of present illness.   Otherwise, review of systems are positive for none.   All other systems are reviewed and negative.    PHYSICAL EXAM: VS:  BP 126/72   Pulse 64   Ht 5\' 8"  (1.727 m)   Wt 146 lb (66.2 kg)   BMI 22.20 kg/m  , BMI Body mass index is 22.2 kg/m. GEN: Well nourished, well developed, in no acute distress  HEENT: normal  Neck: no JVD, carotid bruits, or masses Cardiac: RRR; no murmurs, rubs, or gallops,no edema  Respiratory:  clear to auscultation bilaterally, normal work of breathing GI: soft, nontender, nondistended, + BS MS: no deformity or atrophy  Skin: warm and dry, no rash Neuro:  Strength and sensation are intact Psych: euthymic mood, full affect   EKG:   EKG is ordered today.  sinus rhythm at 74, 1st degree AV block    Recent Labs: 01/07/2016: ALT 9; BUN 20; Creat 1.00; Platelets 261; Potassium 4.0; Sodium 139 06/03/2016: Hemoglobin 13.6    Lipid Panel    Component Value Date/Time   CHOL 229 (H) 01/07/2016 0945   TRIG 64 01/07/2016 0945   HDL 88 01/07/2016 0945   CHOLHDL 2.6 01/07/2016 0945   VLDL 13 01/07/2016 0945   LDLCALC 128 01/07/2016 0945   LDLDIRECT 127.6 04/21/2013 1127      Wt Readings from Last 3 Encounters:  07/22/16  146 lb (66.2 kg)  07/02/16 146 lb (66.2 kg)  06/11/16 143 lb (64.9 kg)       ASSESSMENT AND PLAN:  1. Functioning St. Jude dual-chamber pacemaker for intermittent high-grade AV block. 2. memory disorder 3. hypercholesterolemia followed by Dr. Renold Genta 4. Hypothyroidism  5.  Paroxysmal atrial fibrillation. This patients CHA2DS2-VASc Score is 3 for age and female sex. Above score calculated as 1 point each if present [CHF, HTN, DM, Vascular=MI/PAD/Aortic Plaque, Age if 65-74, or Female] Above score calculated as 2 points each if present [Age > 75, or Stroke/TIA/TE]  Continue Eliquis 5 BID  No bleeding .  Will check CBC tand BMP when I see her in 1 year.   Current medicines are reviewed at length with the patient today.  The patient does not have concerns regarding medicines.  The following changes have been made:  We discussed the risks and benefits of anticoagulation.  She does not have any history of blood loss anemia.  We will start her on Apixaban 5 mg twice a day.  This is the proper dose for her.  She weighs 66 kg and age is less than 60.  Renal function has been normal in the past.  Labs/ tests ordered today include:   No orders of the defined types were placed in this encounter.    Mertie Moores, MD  07/22/2016 8:44 AM    [address],  [place], [place]  [address] Pager [phone] Phone: [phone]; Fax: [phone]

## 2016-08-24 ENCOUNTER — Ambulatory Visit (INDEPENDENT_AMBULATORY_CARE_PROVIDER_SITE_OTHER): Payer: Medicare Other | Admitting: *Deleted

## 2016-08-24 DIAGNOSIS — I442 Atrioventricular block, complete: Secondary | ICD-10-CM | POA: Diagnosis not present

## 2016-08-25 ENCOUNTER — Encounter: Payer: Self-pay | Admitting: Cardiology

## 2016-08-25 LAB — CUP PACEART REMOTE DEVICE CHECK
Battery Remaining Percentage: 95.5 %
Battery Voltage: 2.99 V
Brady Statistic AS VS Percent: 43 %
Brady Statistic RA Percent Paced: 24 %
Date Time Interrogation Session: 20180305084708
Implantable Lead Implant Date: 20140613
Implantable Lead Location: 753859
Implantable Pulse Generator Implant Date: 20140613
Lead Channel Impedance Value: 350 Ohm
Lead Channel Pacing Threshold Amplitude: 0.5 V
Lead Channel Pacing Threshold Pulse Width: 0.5 ms
Lead Channel Pacing Threshold Pulse Width: 0.5 ms
Lead Channel Sensing Intrinsic Amplitude: 3.3 mV
MDC IDC LEAD IMPLANT DT: 20140613
MDC IDC LEAD LOCATION: 753860
MDC IDC MSMT BATTERY REMAINING LONGEVITY: 108 mo
MDC IDC MSMT LEADCHNL RA IMPEDANCE VALUE: 380 Ohm
MDC IDC MSMT LEADCHNL RV PACING THRESHOLD AMPLITUDE: 1 V
MDC IDC MSMT LEADCHNL RV SENSING INTR AMPL: 12 mV
MDC IDC SET LEADCHNL RA PACING AMPLITUDE: 2 V
MDC IDC SET LEADCHNL RV PACING AMPLITUDE: 2.5 V
MDC IDC SET LEADCHNL RV PACING PULSEWIDTH: 0.5 ms
MDC IDC SET LEADCHNL RV SENSING SENSITIVITY: 2 mV
MDC IDC STAT BRADY AP VP PERCENT: 19 %
MDC IDC STAT BRADY AP VS PERCENT: 7.3 %
MDC IDC STAT BRADY AS VP PERCENT: 27 %
MDC IDC STAT BRADY RV PERCENT PACED: 45 %
Pulse Gen Model: 2210
Pulse Gen Serial Number: 7474456

## 2016-08-25 NOTE — Progress Notes (Signed)
Remote pacemaker transmission.   

## 2016-09-07 ENCOUNTER — Ambulatory Visit
Admission: RE | Admit: 2016-09-07 | Discharge: 2016-09-07 | Disposition: A | Discharge status: Home / Self Care | Payer: Medicare Other | Source: Ambulatory Visit | Attending: Internal Medicine | Admitting: Internal Medicine

## 2016-09-07 ENCOUNTER — Ambulatory Visit (INDEPENDENT_AMBULATORY_CARE_PROVIDER_SITE_OTHER): Payer: Medicare Other | Admitting: Internal Medicine

## 2016-09-07 ENCOUNTER — Encounter: Payer: Self-pay | Admitting: Internal Medicine

## 2016-09-07 VITALS — BP 100/62 | Temp 98.5°F | Ht 68.0 in | Wt 148.0 lb

## 2016-09-07 DIAGNOSIS — M19079 Primary osteoarthritis, unspecified ankle and foot: Secondary | ICD-10-CM | POA: Diagnosis not present

## 2016-09-07 DIAGNOSIS — M25532 Pain in left wrist: Secondary | ICD-10-CM

## 2016-09-07 NOTE — Progress Notes (Signed)
   Subjective:    Patient ID: [name], female    DOB: 04-28-1935, 82 y.o.   MRN: [id number]  HPI 82 year old Female with dementia has pain left medial wrist. Does not recall fall or injury but is very forgetful. No swelling or redness. Also pain left MTP joint as well. Husband thinks she had surgery by Dr. Daylene Katayama on thumb a long time ago.    Review of Systems     Objective:   Physical Exam No redness but tender left MTP joint. Good range of motion.  Tender left medial wrist but has good range of motion in left wrist. No redness or swelling.       Assessment & Plan:  Left wrist pain  Left MTP joint pain  Plan: X-ray of left wrist and left hand.  Apply ice 20 minutes a couple of times a day. Tylenol for pain.

## 2016-09-07 NOTE — Patient Instructions (Signed)
Have x-ray of left wrist and left eye in. Apply ice 20 minutes a couple of times a day. Tylenol as needed for pain.

## 2016-09-16 ENCOUNTER — Encounter: Payer: Self-pay | Admitting: Neurology

## 2016-09-16 ENCOUNTER — Ambulatory Visit (INDEPENDENT_AMBULATORY_CARE_PROVIDER_SITE_OTHER): Payer: Medicare Other | Admitting: Neurology

## 2016-09-16 VITALS — BP 118/61 | HR 59 | Resp 20 | Ht 67.0 in | Wt 147.0 lb

## 2016-09-16 DIAGNOSIS — F0280 Dementia in other diseases classified elsewhere without behavioral disturbance: Secondary | ICD-10-CM | POA: Diagnosis not present

## 2016-09-16 DIAGNOSIS — G301 Alzheimer's disease with late onset: Secondary | ICD-10-CM

## 2016-09-16 MED ORDER — MEMANTINE HCL-DONEPEZIL HCL ER 28-10 MG PO CP24: 28.0000 mg | ORAL_CAPSULE | Freq: Every day | ORAL | 3 refills | Status: DC

## 2016-09-16 MED ORDER — MEMANTINE HCL ER 28 MG PO CP24: 28.0000 mg | ORAL_CAPSULE | Freq: Every day | ORAL | 7 refills | Status: DC

## 2016-09-16 NOTE — Patient Instructions (Signed)
Alzheimer Disease Alzheimer disease is a brain disease that affects memory, thinking, and behavior. People with Alzheimer disease lose mental abilities, and the disease gets worse over time. Survival with Alzheimer disease ranges from several years to as long as 20 years. What are the causes? This condition develops when a protein called beta-amyloid forms deposits in the brain. It is not known what causes these deposits to form. What increases the risk? This condition is more likely to develop in people who:  Are elderly.  Have a family history of dementia.  Have had a brain injury.  Have heart or blood vessel disease.  Have had a stroke.  Have high blood pressure or high cholesterol.  Have diabetes. What are the signs or symptoms? Symptoms of this condition happen in three stages, which often overlap. Early stage In this stage, you may continue to be independent. You may still be able to drive, work, and be social. Symptoms in this stage include:  Minor memory problems, such as forgetting a name or what you read.  Difficulty with:  Paying attention.  Communicating.  Doing familiar tasks.  Learning new things.  Needing more time to do daily activities.  Anxiety.  Social withdrawal.  Loss of motivation. Moderate stage In this stage, you will start to need care. This stage usually lasts the longest. Symptoms in this stage include:  Difficulty with expressing thoughts.  Memory loss that affects daily life. This can include forgetting:  Your address or phone number.  Events that have happened.  Parts of your personal history, like where you went to school.  Confusion about where you are or what time it is.  Difficulty in judging distance.  Changes in personality, mood, and behavior. You may be moody, irritable, angry, frustrated, fearful, anxious, or suspicious.  Poor reasoning and judgment.  Delusions or hallucinations.  Changes in sleep  patterns.  Wandering and getting lost. Severe stage In the final stage, you will need help with your personal care and dailyactivities. Symptoms in this stage include:  Worsening memory loss.  Personality changes.  Loss of awareness of your surroundings.  Changes in physical abilities, including the ability to walk, sit, and swallow.  Difficulty in communicating.  Inability to control the bladder and bowels.  Increasing confusion.  Increasing disruptive behavior. How is this diagnosed? This condition is diagnosed with an assessment by your health care provider. During this assessment, your health care provider will talk with you and your family, friends, or caregivers about your symptoms. A thorough medical history will be taken, and you will have a physical exam and tests. Tests may include:  Lab tests, such as blood or urine tests.  Imaging tests, such as a CT scan, PET scan, or MRI.  A lumbar puncture. This test involves removing and testing a small amount of the fluid that surrounds the brain and spinal cord.  An electroencephalogram (EEG). In this test, small metal discs are used to measure electrical activity in the brain.  Memory tests, cognitive tests, and neuropsychological tests. These tests evaluate brain function. How is this treated? At this time, there is no treatment to cure Alzheimer disease or stop it from getting worse. The goals of treatment are:  To slow down the disease.  To manage behavioral problems.  To provide you with a safe environment.  To make life easier for you and your caregivers. The following treatment options are available:  Medicines. Medicines may help to slow down memory loss and control behavioral symptoms.    Talk therapy. Talk therapy provides you with education, support, and memory aids. It is most helpful in the early stages of the condition.  Counseling or spiritual guidance. It is normal to have a lot of feelings, including  anger, relief, fear, and isolation. Counseling and guidance can help you deal with these feelings.  Caregiving. This involves having caregivers help you with your daily activities. Caregivers may be family members, friends, or trained medical professionals. Caregiving can be done at home or outside the home.  Family support groups. These provide education, emotional support, and information about community resources to family members who are taking care of you. Follow these instructions at home: Medicines  Take over-the-counter and prescription medicines only as told by your health care provider.  Avoid taking medicines that can affect thinking, such as pain or sleeping medicines. Lifestyle  Make healthy lifestyle choices:  Be physically active as told by your health care provider.  Do not use any tobacco products, such as cigarettes, chewing tobacco, and e-cigarettes. If you need help quitting, ask your health care provider.  Eat a healthy diet.  Practice stress-management techniques when you get stressed.  Stay social.  Drink enough fluid to keep your urine clear or pale yellow.  Make sure to get quality sleep. These tips can help you get a good night's rest:  Avoid napping during the day.  Keep your sleeping area dark and cool.  Avoid exercising during the few hours before you go to bed.  Avoid caffeine products in the evening. General instructions  Work with your health care provider to determine what you need help with and what your safety needs are.  If you were given a bracelet that tracks your location, make sure to wear it.  Keep all follow-up visits as told by your health care provider. This is important.  If you have questions or would like additional support, you may contact The Alzheimer[employer]:  24-hour helpline: 1-800-272-3900  Website: www.alz.org Contact a health care provider if:  You have nausea, vomiting, or trouble with eating.  You  have dizziness, or weakness.  You have new or worsening trouble with sleeping.  You or your family members become concerned for your safety. Get help right away if:  You develop chest pain or difficulty with breathing.  You pass out. This information is not intended to replace advice given to you by your health care provider. Make sure you discuss any questions you have with your health care provider. Document Released: 02/18/2004 Document Revised: 02/07/2016 Document Reviewed: 03/06/2015 Elsevier Interactive Patient Education  2017 [employer]  

## 2016-09-16 NOTE — Progress Notes (Addendum)
[facility]  PATIENT: [name] DOB: 01-08-1935   REASON FOR VISIT: follow up for Deemntia of Alzheimer's type. Progressive memory loss HISTORY FROM:patient and husband    HISTORY OF PRESENT ILLNESS:   [name] is  Married, Caucasian 82 year old female Patient , who returns for follow-up in the presence of her husband for dementia follow up.  Today is 09/16/2016. We are meeting year for a follow-up on cognitive concerns. The patient has reached a Mini-Mental Status Examination score of 17 out of 30 points, indicative of her progress dementia. She is no longer fully oriented to place and date and time and she could not recall any of the 3 recall words. She is able however to follow step-by-step, arms. She did not endorse any other review of system relevant issues or problems, the couple lives in a private home. Her weight has been steady, her appetite has not declined. The patient sleeps downstairs her husband sleeps upstairs but he prepares breakfast in the morning and get her ready for the day. She feeds her 2 Chihuahuas. They eat out every day at [employer] on [address].  She is mobile with the help of a cane. She had a hip fracture in 2016 and since then has some groin pain. It is my understanding that she may face a hip replacement surgery. She also describes some wrist pain on the left. There has been no new fall in 6 month.  He will see her hand specialist tomorrow, she will have hip evaluation in April 2018. The couple will address rehab at [facility] on [address].  namziric at night.     07/27/14 CM [name], 82 year old female returns for followup. She was last seen by Dr. Brett Fairy 01/30/2014. At that time her Mini-Mental Status exam score had changed significantly from 28 to 23. EEG in the past normal. Dementia labs return normal. Ct of the head without change from last in Jan 2011. She was taking Aricept 23 mg daily until 2 weeks ago when she read an  article in the newspaper that there was a better drug on the market. She does not remember what that drug was. She claims she exercises by walking every day. She does minimal cooking she and her husband eat out a lot. She is able to perform her activities of daily living such as dressing bathing etc. She does not do the finances. She lives with her husband. She does not drive. 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 The patient has a history of depression, hypothyroidism, possible pre-diabetes. Dr. Renold Genta is concerned about possible early dementia. The patient also states that she has a pacemaker has a history of a first degree A V. block. She'll has a history of dizziness and possible TIA and is an established patient with Dr. Leonie Man at [facility].  She had transient left-sided weakness when he saw her in 2011. In March 2011, she underwent an MRI of the brain ( ordering physician was Dr. Brett Fairy) with no evidence of tumor or infarct or bleed. Evident was chronic microvascular ischemia ,but this was considered age related ( Dr Leonie Man interpreted) .  The evaluation for possible stroke or TIA in March 2011 followed a history of a sudden fall within the confines of her home.  She had walked from the living room to the kitchen and fell between the coffee table and the sofa. Apparently, there was a second episode when she fell suddenly to the right. The second episode was associated with numbness of the left side. 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 A cardiac monitoring revealed AV block as the possible cause for her falls. She has seen Dr. Crissie Sickles twice 2011 year for syncope with a sick sinus syndrome ( but states she" hadn't fainted " ).  She has a Diplomatic Services operational officer since ? (" i don't know when- its been a while " )  Meanwhile, the patient feels that her memory has never been that much better and that in all functions have been taking away from her- she never needed to keep the financial books.  She feels, that she is performrng at the same level as last year or 2 years ago.  Extensive laboratory results were already performed by her primary care physician and reviewed today, she has no problems with sleep, she has no nocturnal hallucinations she reports normal light terrors or REM behavior disorder, she also had no recent falls. There is no parkinsonism, shuffling gait, urinary incontinence.     REVIEW OF SYSTEMS: Full 14 system review of systems performed and notable only for those listed, all others are neg:  Gastroitestinal: urinary frequency  Neurological: memory loss  ALLERGIES: Allergies  Allergen Reactions  . Sulfa Antibiotics Diarrhea and Rash  . Sulfasalazine Diarrhea and Rash    HOME MEDICATIONS: Outpatient Medications Prior to Visit  Medication Sig Dispense Refill  . alendronate (FOSAMAX) 70 MG tablet Take 70 mg by mouth once a week. Take with a full glass of water on an empty stomach.    Marland Kitchen apixaban (ELIQUIS) 5 MG TABS tablet Take 1 tablet (5 mg total) by mouth 2 (two) times daily. 60 tablet 11  . Cholecalciferol (VITAMIN D PO) Take by mouth.    . levothyroxine (SYNTHROID, LEVOTHROID) 75 MCG tablet Take 75 mcg by mouth daily before breakfast.     . memantine (NAMENDA XR) 28 MG CP24 24 hr capsule Take 1 capsule (28 mg total) by mouth daily. 30 capsule 7  . methylcellulose (ARTIFICIAL TEARS) 1 % ophthalmic solution Place 1 drop into both eyes 4 (four) times daily.     . Omega-3 Fatty Acids (FISH OIL PO) Take by mouth.    Marland Kitchen HYDROcodone-acetaminophen (NORCO/VICODIN) 5-325 MG per tablet Take 1 tablet by mouth every 6 (six) hours as needed for moderate pain. Reported on 08/15/2015     No facility-administered  medications prior to visit.     PAST MEDICAL HISTORY: Past Medical History:  Diagnosis Date  . Allergy   . Arthritis   . Asthma   . Colon polyps   . Complete heart block -intermittent    a. 11/2012 s/p SJM Accent DR RF dc ppm.  . Depression   . Environmental allergies   . Hyperlipidemia   . Memory retention disorder 03/22/2013  . Osteoarthritis    hands  . Thyroid disease     PAST SURGICAL HISTORY: Past Surgical History:  Procedure Laterality Date  . APPENDECTOMY  1965  . CERVICAL POLYPECTOMY  1996  . COLONOSCOPY  2007   adenomatous polyps  . EYE SURGERY    . HAND SURGERY    . HIP ARTHROPLASTY Right 01/03/2015   Procedure: ARTHROPLASTY BIPOLAR HIP (HEMIARTHROPLASTY);  Surgeon: [name], MD;  Location: [facility];  Service: Orthopedics;  Laterality: Right;  . INSERT / REPLACE / REMOVE PACEMAKER  12/02/2012    Dr Lovena Le  . KNEE SURGERY    . ovarian cystectomy  1965  . PERMANENT PACEMAKER INSERTION N/A 12/02/2012   Procedure: PERMANENT PACEMAKER INSERTION;  Surgeon: Evans Lance, MD;  Location: [facility] CATH LAB;  Service: Cardiovascular;  Laterality: N/A;  . SALPINGOOPHORECTOMY     right  . tumor removed  1985   abdomen    FAMILY HISTORY: Family History  Problem Relation Age of Onset  . Colon cancer Father   . Colon polyps Brother   . Diabetes Brother     maternal aunt  . Coronary artery disease Brother     SOCIAL HISTORY: Social History   Social History  . Marital status: Married    Spouse name: [name]  . Number of children: 3  . Years of education: College   Occupational History  . elem. school teacher     retired   Social History Main Topics  . Smoking status: Never Smoker  . Smokeless tobacco: Never Used  . Alcohol use 0.0 oz/week     Comment: occas. which is seldom  . Drug use: No  . Sexual activity: Not on file   Other Topics Concern  . Not on file   Social History Narrative   Patient has one daughter, two adopted children(son,daughter).    Patient lives at home with spouse [name]).   Patient is retired.   Patient has a college education.   Patient is right-handed.   Patient drinks very little caffeine.     PHYSICAL EXAM  Vitals:   09/16/16 0928  BP: 118/61  Pulse: (!) 59  Resp: 20  Weight: 147 lb (66.7 kg)  Height: 5\' 7"  (1.702 m)   Body mass index is 23.02 kg/m. Generalized: Well developed, in no acute distress , well groomed Head: normocephalic and atraumatic,. Oropharynx benign  Neck: Supple Cardiac: Regular rate rhythm Musculoskeletal: No deformity   Neurological examination   Mentation: Alert oriented to time, place, history taking. MMSE - Mini Mental State Exam 09/16/2016 03/19/2016 08/22/2015  Orientation to time 2 1 2   Orientation to Place 2 2 1   Registration 3 3 3   Attention/ Calculation 1 5 4   Recall 0 0 0  Language- name 2 objects 2 2 2   Language- repeat 1 1 1   Language- follow 3 step command 3 3 3   Language- read & follow direction 1 1 1   Write a sentence 1 1 0  Copy design 1 1 1   Total score 17 20 18      Follows all commands,  She repeats herself frequently.  Cranial nerve ; normal sense of smell and taste is preserved.Pupils were equal round reactive to light extraocular movements were full, visual field were full on confrontational test. Facial sensation and strength were normal. hearing was intact to finger rubbing bilaterally. Uvula tongue midline. head turning and shoulder shrug were normal and symmetric.Tongue protrusion into cheek strength was normal. Motor: normal bulk and tone, full strength in grip. No focal weakness Sensory: normal and symmetric to light touch, pinprick, and Vibration, proprioception  Coordination: finger-nose-finger without dysmetria Reflexes:  2/2, Gait and Station: Rising up from seated position without assistance, normal stance, moderate stride, good arm swing, walking with a cane.  She has recovered from a femur fracture July 2016 .   DIAGNOSTIC DATA  (LABS, IMAGING, TESTING)  MMSE - Mini Mental State Exam 09/16/2016 03/19/2016 08/22/2015  Orientation to time 2 1 2   Orientation to Place 2 2 1   Registration 3 3 3   Attention/ Calculation 1 5 4   Recall 0 0 0  Language- name 2 objects 2 2 2   Language- repeat 1 1 1   Language- follow 3 step command 3 3 3   Language- read & follow direction 1 1 1   Write a sentence 1 1 0  Copy design 1 1 1   Total score 17 20 18     - ASSESSMENT AND PLAN  Alzheimer's dementia.   Continue Namenda  and aricept XR ,  NAMZARIC 28 mg with patient assistance .  Mrs. Rafferty lives in the same home they moved to 35 years ago this month. Since she does not have to manage stairs and has not shown disoriented behavior outside the home, she is no longer operating a motor vehicle, she doesn't have to cook, I feel that she is safe in the current residence that she is so familiar with. There is an extended care facility very close to their family home, on [address], [address] [employer] [address]. If the patient would need structured daytime activity I would recommend an adult daytime care facility.   Exercise for overall health and well being Exercise the brain such as word games cross words strategies etc. Create a safe environment, remove locks on bathroom  Doors Reduced confusion, keep familiar objects and people around, stick to a routine Use effective communication such as simple words and short sentences Reduce nighttime restlessness, a consistent nighttime routine,  avoid napping during the day Encourage good nutrition and hydration   Follow-up in 6 months next visit with [name].   [place] [address] [place], [place] [address] [phone]

## 2016-09-16 NOTE — Addendum Note (Signed)
Addended by: Larey Seat on: 09/16/2016 10:06 AM   Modules accepted: Orders

## 2016-09-29 DIAGNOSIS — M1812 Unilateral primary osteoarthritis of first carpometacarpal joint, left hand: Secondary | ICD-10-CM | POA: Insufficient documentation

## 2016-11-23 ENCOUNTER — Ambulatory Visit (INDEPENDENT_AMBULATORY_CARE_PROVIDER_SITE_OTHER): Payer: Medicare Other | Admitting: *Deleted

## 2016-11-23 DIAGNOSIS — I442 Atrioventricular block, complete: Secondary | ICD-10-CM | POA: Diagnosis not present

## 2016-11-23 NOTE — Progress Notes (Signed)
Remote pacemaker transmission.   

## 2016-11-25 LAB — CUP PACEART REMOTE DEVICE CHECK
Battery Remaining Percentage: 95.5 %
Brady Statistic AP VP Percent: 18 %
Brady Statistic AP VS Percent: 9.2 %
Brady Statistic AS VP Percent: 25 %
Brady Statistic RA Percent Paced: 25 %
Brady Statistic RV Percent Paced: 42 %
Date Time Interrogation Session: 20180604071424
Implantable Lead Implant Date: 20140613
Implantable Lead Location: 753860
Implantable Pulse Generator Implant Date: 20140613
Lead Channel Impedance Value: 380 Ohm
Lead Channel Pacing Threshold Amplitude: 1 V
Lead Channel Pacing Threshold Pulse Width: 0.5 ms
Lead Channel Pacing Threshold Pulse Width: 0.5 ms
Lead Channel Sensing Intrinsic Amplitude: 12 mV
Lead Channel Sensing Intrinsic Amplitude: 4 mV
Lead Channel Setting Pacing Amplitude: 2 V
Lead Channel Setting Pacing Amplitude: 2.5 V
Lead Channel Setting Pacing Pulse Width: 0.5 ms
Lead Channel Setting Sensing Sensitivity: 2 mV
MDC IDC LEAD IMPLANT DT: 20140613
MDC IDC LEAD LOCATION: 753859
MDC IDC MSMT BATTERY REMAINING LONGEVITY: 109 mo
MDC IDC MSMT BATTERY VOLTAGE: 2.99 V
MDC IDC MSMT LEADCHNL RA IMPEDANCE VALUE: 360 Ohm
MDC IDC MSMT LEADCHNL RA PACING THRESHOLD AMPLITUDE: 0.5 V
MDC IDC STAT BRADY AS VS PERCENT: 45 %
Pulse Gen Serial Number: 7474456

## 2016-12-01 ENCOUNTER — Encounter: Payer: Self-pay | Admitting: Cardiology

## 2017-02-04 ENCOUNTER — Other Ambulatory Visit: Payer: Medicare Other | Admitting: Internal Medicine

## 2017-02-04 DIAGNOSIS — F0390 Unspecified dementia without behavioral disturbance: Secondary | ICD-10-CM

## 2017-02-04 DIAGNOSIS — E039 Hypothyroidism, unspecified: Secondary | ICD-10-CM

## 2017-02-04 DIAGNOSIS — I442 Atrioventricular block, complete: Secondary | ICD-10-CM

## 2017-02-04 DIAGNOSIS — R634 Abnormal weight loss: Secondary | ICD-10-CM

## 2017-02-04 DIAGNOSIS — Z0000 Encounter for general adult medical examination without abnormal findings: Secondary | ICD-10-CM

## 2017-02-04 DIAGNOSIS — F0280 Dementia in other diseases classified elsewhere without behavioral disturbance: Secondary | ICD-10-CM

## 2017-02-04 DIAGNOSIS — G301 Alzheimer's disease with late onset: Secondary | ICD-10-CM

## 2017-02-04 DIAGNOSIS — E785 Hyperlipidemia, unspecified: Secondary | ICD-10-CM

## 2017-02-04 LAB — CBC WITH DIFFERENTIAL/PLATELET
BASOS PCT: 0 %
Basophils Absolute: 0 cells/uL (ref 0–200)
EOS PCT: 2 %
Eosinophils Absolute: 130 cells/uL (ref 15–500)
HCT: 39.6 % (ref 35.0–45.0)
Hemoglobin: 13.2 g/dL (ref 11.7–15.5)
LYMPHS PCT: 23 %
Lymphs Abs: 1495 cells/uL (ref 850–3900)
MCH: 31.7 pg (ref 27.0–33.0)
MCHC: 33.3 g/dL (ref 32.0–36.0)
MCV: 95.2 fL (ref 80.0–100.0)
MPV: 8.2 fL (ref 7.5–12.5)
Monocytes Absolute: 910 cells/uL (ref 200–950)
Monocytes Relative: 14 %
NEUTROS ABS: 3965 {cells}/uL (ref 1500–7800)
Neutrophils Relative %: 61 %
PLATELETS: 203 10*3/uL (ref 140–400)
RBC: 4.16 MIL/uL (ref 3.80–5.10)
RDW: 14.1 % (ref 11.0–15.0)
WBC: 6.5 10*3/uL (ref 3.8–10.8)

## 2017-02-04 LAB — TSH: TSH: 2.97 m[IU]/L

## 2017-02-05 LAB — LIPID PANEL
Cholesterol: 203 mg/dL — ABNORMAL HIGH (ref <200)
HDL: 80 mg/dL (ref >50)
LDL CALC: 103 mg/dL — AB (ref <100)
TRIGLYCERIDES: 102 mg/dL (ref <150)
Total CHOL/HDL Ratio: 2.5 Ratio (ref <5.0)
VLDL: 20 mg/dL (ref <30)

## 2017-02-05 LAB — COMPLETE METABOLIC PANEL WITH GFR
ALT: 10 U/L (ref 6–29)
AST: 17 U/L (ref 10–35)
Albumin: 3.8 g/dL (ref 3.6–5.1)
Alkaline Phosphatase: 63 U/L (ref 33–130)
BILIRUBIN TOTAL: 0.4 mg/dL (ref 0.2–1.2)
BUN: 30 mg/dL — ABNORMAL HIGH (ref 7–25)
CHLORIDE: 104 mmol/L (ref 98–110)
CO2: 23 mmol/L (ref 20–32)
Calcium: 9.1 mg/dL (ref 8.6–10.4)
Creat: 0.96 mg/dL — ABNORMAL HIGH (ref 0.60–0.88)
GFR, Est African American: 65 mL/min (ref >=60)
GFR, Est Non African American: 56 mL/min — ABNORMAL LOW (ref >=60)
GLUCOSE: 80 mg/dL (ref 65–99)
Potassium: 3.9 mmol/L (ref 3.5–5.3)
SODIUM: 138 mmol/L (ref 135–146)
TOTAL PROTEIN: 6 g/dL — AB (ref 6.1–8.1)

## 2017-02-08 ENCOUNTER — Encounter: Payer: Self-pay | Admitting: Internal Medicine

## 2017-02-08 ENCOUNTER — Ambulatory Visit (INDEPENDENT_AMBULATORY_CARE_PROVIDER_SITE_OTHER): Payer: Medicare Other | Admitting: Internal Medicine

## 2017-02-08 VITALS — BP 100/70 | HR 68 | Temp 98.3°F | Ht 66.0 in | Wt 150.0 lb

## 2017-02-08 DIAGNOSIS — Z0000 Encounter for general adult medical examination without abnormal findings: Secondary | ICD-10-CM

## 2017-02-08 DIAGNOSIS — M79651 Pain in right thigh: Secondary | ICD-10-CM | POA: Diagnosis not present

## 2017-02-08 DIAGNOSIS — Z8781 Personal history of (healed) traumatic fracture: Secondary | ICD-10-CM | POA: Diagnosis not present

## 2017-02-08 DIAGNOSIS — R413 Other amnesia: Secondary | ICD-10-CM

## 2017-02-08 DIAGNOSIS — Z8679 Personal history of other diseases of the circulatory system: Secondary | ICD-10-CM | POA: Diagnosis not present

## 2017-02-08 DIAGNOSIS — Z950 Presence of cardiac pacemaker: Secondary | ICD-10-CM

## 2017-02-08 DIAGNOSIS — Z7901 Long term (current) use of anticoagulants: Secondary | ICD-10-CM | POA: Diagnosis not present

## 2017-02-08 DIAGNOSIS — Z8673 Personal history of transient ischemic attack (TIA), and cerebral infarction without residual deficits: Secondary | ICD-10-CM

## 2017-02-08 LAB — POCT URINALYSIS DIPSTICK
BILIRUBIN UA: NEGATIVE
GLUCOSE UA: NEGATIVE
Ketones, UA: NEGATIVE
Leukocytes, UA: NEGATIVE
Nitrite, UA: NEGATIVE
PH UA: 6 (ref 5.0–8.0)
Protein, UA: NEGATIVE
SPEC GRAV UA: 1.02 (ref 1.010–1.025)
Urobilinogen, UA: 0.2 E.U./dL

## 2017-02-08 NOTE — Patient Instructions (Addendum)
It was a pleasure to see you today. Have flu vaccine. Continue same medications. Return in one year or as needed

## 2017-02-08 NOTE — Progress Notes (Signed)
Subjective:    Patient ID: [name], female    DOB: 08-12-1934, 82 y.o.   MRN: [id number]  HPI  82 year old Female for Medicare wellness, health maintenance exam and evaluation of medical issues.   Has right thigh pain and recently saw orthopedist who injected her hip with relief according to her husband. However she still having some right thigh pain.  No recent mammogram. Order placed  Hx memory loss and is on Namzaric.  Is on chronic anticoagulation for hx PAF and has pacemaker. Followed by Drs. Lovena Le and [employer].  Hx right hip fracture 2016 although does not recall fall or injury and is taking Fosamax.  Her weight is 150 pounds and stable. BMI 24.21. Weight in 2015 was 135 pounds.  Dr. Elder Negus physician.  She sees neurologist for memory loss. Long-standing history of urinary frequency and nocturia.   She had colonoscopy in May 2013 by Dr. Carlean Purl showing a hyperplastic polyp. In 2007 she had colonoscopy by Dr. Penelope Coop and had 2 tubular adenomas removed.  Has had Zostavax vaccine at pharmacy.  Gets annual flu vaccine.  In 1996 she had a polyp removed from her cervical lost. She had a right ovarian cystectomy and appendectomy 1965.  Social history: She is a retired Chief Technology Officer. Husband is a retired Merchant navy officer. Patient taught second and third grade for 30 years. Rare alcohol consumption. Nonsmoker. 3 children: One son and one daughter adopted. They have 1 daughter who is their natural child.  Family history: Father died from colon cancer at age 71. Mother died from a colon blockage after surgery at age 78. Brother with history of coronary artery disease and stent placement.  At one point, she saw Dr. Lajuana Ripple for depression and was on Paxil for about a year in 1997.  Has been to [facility] for Pap smears and GYN care in the past.  In 2001 she had reconstruction of left thumb CMC joint by Dr. Daylene Katayama      Review of Systems    Neurological:       Memory loss   ambulates with a Lantus and complaining of right thigh pain     Objective:   Physical Exam  Constitutional: She appears well-developed and well-nourished. No distress.  HENT:  Head: Normocephalic and atraumatic.  Right Ear: External ear normal.  Left Ear: External ear normal.  Mouth/Throat: Oropharynx is clear and moist.  Eyes: Pupils are equal, round, and reactive to light. Conjunctivae are normal. Right eye exhibits no discharge. Left eye exhibits no discharge. No scleral icterus.  Neck: Neck supple. No JVD present. No thyromegaly present.  Cardiovascular: Normal rate, regular rhythm, normal heart sounds and intact distal pulses.   No murmur heard. Pulmonary/Chest: Effort normal and breath sounds normal.  Abdominal: She exhibits no distension and no mass. There is no tenderness. There is no rebound and no guarding.  Genitourinary:  Genitourinary Comments: Bimanual normal. Pap deferred due to age  Musculoskeletal: She exhibits no edema.  Lymphadenopathy:    She has no cervical adenopathy.  Neurological: She is alert. She has normal reflexes. No cranial nerve deficit.  She does not know day of week, year, month, President of the [place].  Skin: Skin is warm. No rash noted. She is not diaphoretic.  Psychiatric: She has a normal mood and affect. Her behavior is normal.  Vitals reviewed.         Assessment & Plan:  Memory loss seems a bit worse to me. She  is cooperative and doesn't present with behavioral problems according to her husband. They have help at home several days a week which is good. They eat 1 good meal at a restaurant every day.  History of pacemaker  History of paroxysmal atrial fib on chronic anticoagulation  Right thigh pain  History of right hip fracture 2016  History of hyperlipidemia-total cholesterol is 203 improved from 229 a year ago. LDL cholesterol is 103 and was 128 a year ago. HDL is 80. Triglycerides are  102.  Hypothyroidism on thyroid replacement therapy-TSH stable  Plan: Return in one year or as needed.  Subjective:   Patient presents for Medicare Annual/Subsequent preventive examination.  Review Past Medical/Family/Social:See above   Risk Factors  Current exercise habits: Sedentary Dietary issues discussed: Low fat low carbohydrate  Cardiac risk factors:Family history in brother, patient has pacemaker and history of hyperlipidemia  Depression Screen  (Note: if answer to either of the following is "Yes", a more complete depression screening is indicated)   Over the past two weeks, have you felt down, depressed or hopeless? No  Over the past two weeks, have you felt little interest or pleasure in doing things? No Have you lost interest or pleasure in daily life? No Do you often feel hopeless? No Do you cry easily over simple problems? No   Activities of Daily Living  In your present state of health, do you have any difficulty performing the following activities?:   Driving? Does not drive Managing money? Husband manages monthly Feeding yourself? No  Getting from bed to chair? No  Climbing a flight of stairs? No  Preparing food and eating?: No  Bathing or showering? No  Getting dressed: No  Getting to the toilet? No  Using the toilet:No  Moving around from place to place: No  In the past year have you fallen or had a near fall?:No  Are you sexually active? No  Do you have more than one partner? No   Hearing Difficulties: No  Do you often ask people to speak up or repeat themselves? No  Do you experience ringing or noises in your ears? No  Do you have difficulty understanding soft or whispered voices? No  Do you feel that you have a problem with memory?Yes Do you often misplace items? Yes   Home Safety:  Do you have a smoke alarm at your residence? Yes Do you have grab bars in the bathroom? Yes Do you have throw rugs in your house? 1   Cognitive Testing    Alert? Yes Normal Appearance?Yes  Oriented to person? Yes Place? Yes  Time? No Recall of three objects? No Can perform simple calculations? No Displays appropriate judgment? Unable to assess Can read the correct time from a watch face?Yes   List the Names of Other Physician/Practitioners you currently use:  See referral list for the physicians patient is currently seeing.     Review of Systems: See above   Objective:     General appearance: Appears stated age and mildly obese  Head: Normocephalic, without obvious abnormality, atraumatic  Eyes: conj clear, EOMi PEERLA  Ears: normal TM's and external ear canals both ears  Nose: Nares normal. Septum midline. Mucosa normal. No drainage or sinus tenderness.  Throat: lips, mucosa, and tongue normal; teeth and gums normal  Neck: no adenopathy, no carotid bruit, no JVD, supple, symmetrical, trachea midline and thyroid not enlarged, symmetric, no tenderness/mass/nodules  No CVA tenderness.  Lungs: clear to auscultation bilaterally  Breasts: normal  appearance, no masses or tenderness Heart: regular rate and rhythm, S1, S2 normal, no murmur, click, rub or gallop  Abdomen: soft, non-tender; bowel sounds normal; no masses, no organomegaly  Musculoskeletal: ROM normal in all joints, no crepitus, no deformity, Normal muscle strengthen. Back  is symmetric, no curvature. Skin: Skin color, texture, turgor normal. No rashes or lesions  Lymph nodes: Cervical, supraclavicular, and axillary nodes normal.  Neurologic: CN 2 -12 Normal, Normal symmetric reflexes. Normal coordination and gait  Psych: Alert & Oriented x 3, Mood appear stable.    Assessment:    Annual wellness medicare exam   Plan:    During the course of the visit the patient was educated and counseled about appropriate screening and preventive services including:   Recommend mammogram  Annual flu vaccine     Patient Instructions (the written plan) was given to the  patient.  Medicare Attestation  I have personally reviewed:  The patient's medical and social history  Their use of alcohol, tobacco or illicit drugs  Their current medications and supplements  The patient's functional ability including ADLs,fall risks, home safety risks, cognitive, and hearing and visual impairment  Diet and physical activities  Evidence for depression or mood disorders  The patient's weight, height, BMI, and visual acuity have been recorded in the chart. I have made referrals, counseling, and provided education to the patient based on review of the above and I have provided the patient with a written personalized care plan for preventive services.

## 2017-02-24 ENCOUNTER — Ambulatory Visit (INDEPENDENT_AMBULATORY_CARE_PROVIDER_SITE_OTHER): Payer: Medicare Other | Admitting: *Deleted

## 2017-02-24 DIAGNOSIS — I442 Atrioventricular block, complete: Secondary | ICD-10-CM | POA: Diagnosis not present

## 2017-02-25 NOTE — Progress Notes (Signed)
Remote pacemaker transmission.   

## 2017-03-02 ENCOUNTER — Encounter: Payer: Self-pay | Admitting: Cardiology

## 2017-03-10 ENCOUNTER — Encounter: Payer: Self-pay | Admitting: Internal Medicine

## 2017-03-17 LAB — CUP PACEART REMOTE DEVICE CHECK
Battery Remaining Longevity: 108 mo
Brady Statistic AP VS Percent: 8 %
Brady Statistic AS VP Percent: 24 %
Brady Statistic AS VS Percent: 45 %
Brady Statistic RV Percent Paced: 43 %
Date Time Interrogation Session: 20180905075032
Lead Channel Impedance Value: 390 Ohm
Lead Channel Pacing Threshold Amplitude: 1 V
Lead Channel Pacing Threshold Pulse Width: 0.5 ms
Lead Channel Sensing Intrinsic Amplitude: 12 mV
Lead Channel Setting Pacing Amplitude: 2 V
Lead Channel Setting Pacing Amplitude: 2.5 V
Lead Channel Setting Pacing Pulse Width: 0.5 ms
MDC IDC LEAD IMPLANT DT: 20140613
MDC IDC LEAD IMPLANT DT: 20140613
MDC IDC LEAD LOCATION: 753859
MDC IDC LEAD LOCATION: 753860
MDC IDC MSMT BATTERY REMAINING PERCENTAGE: 95.5 %
MDC IDC MSMT BATTERY VOLTAGE: 2.99 V
MDC IDC MSMT LEADCHNL RA PACING THRESHOLD AMPLITUDE: 0.5 V
MDC IDC MSMT LEADCHNL RA PACING THRESHOLD PULSEWIDTH: 0.5 ms
MDC IDC MSMT LEADCHNL RA SENSING INTR AMPL: 3.6 mV
MDC IDC MSMT LEADCHNL RV IMPEDANCE VALUE: 340 Ohm
MDC IDC PG IMPLANT DT: 20140613
MDC IDC PG SERIAL: 7474456
MDC IDC SET LEADCHNL RV SENSING SENSITIVITY: 2 mV
MDC IDC STAT BRADY AP VP PERCENT: 19 %
MDC IDC STAT BRADY RA PERCENT PACED: 26 %

## 2017-03-22 ENCOUNTER — Encounter: Payer: Self-pay | Admitting: Adult Health

## 2017-03-22 ENCOUNTER — Ambulatory Visit (INDEPENDENT_AMBULATORY_CARE_PROVIDER_SITE_OTHER): Payer: Medicare Other | Admitting: Adult Health

## 2017-03-22 VITALS — BP 92/51 | HR 64 | Ht 66.0 in | Wt 151.4 lb

## 2017-03-22 DIAGNOSIS — G309 Alzheimer's disease, unspecified: Secondary | ICD-10-CM

## 2017-03-22 DIAGNOSIS — F0280 Dementia in other diseases classified elsewhere without behavioral disturbance: Secondary | ICD-10-CM

## 2017-03-22 NOTE — Progress Notes (Signed)
PATIENT: [name] DOB: 11/08/1934  REASON FOR VISIT: follow up- memory HISTORY FROM: patient and caregiver- martha  HISTORY OF PRESENT ILLNESS: Today 03/22/17 [name] is an 82 year old female with a history of Alzheimer's disease. She returns today for follow-up. She continues to live at home with her husband. She does have a caregiver Jana Half that comes and helps her daily. She does require prompting with ADLs. Her husband manages her medication. She states at times she does have a trouble falling asleep. Her husband manages all the finances. She no longer cooks meals. Her husband helps her with breakfast. They typically go out  for lunch and dinner. The patient remains on namzaric. Caregiver denies any significant agitation or aggressiveness. Denies any new neurological symptoms. He returns today for an evaluation.  HISTORY 09/16/16: [name] is  Married, Caucasian 82 year old female Patient , who returns for follow-up in the presence of her husband for dementia follow up.  Today is 09/16/2016. We are meeting year for a follow-up on cognitive concerns. The patient has reached a Mini-Mental Status Examination score of 17 out of 30 points, indicative of her progress dementia. She is no longer fully oriented to place and date and time and she could not recall any of the 3 recall words. She is able however to follow step-by-step, arms. She did not endorse any other review of system relevant issues or problems, the couple lives in a private home. Her weight has been steady, her appetite has not declined. The patient sleeps downstairs her husband sleeps upstairs but he prepares breakfast in the morning and get her ready for the day. She feeds her 2 Chihuahuas. They eat out every day at [employer] on [address].  She is mobile with the help of a cane. She had a hip fracture in 2016 and since then has some groin pain. It is my understanding that she may face a hip replacement surgery. She also describes  some wrist pain on the left. There has been no new fall in 6 month.  He will see her hand specialist tomorrow, she will have hip evaluation in April 2018. The couple will address rehab at [facility] on [address].  namziric at night.       REVIEW OF SYSTEMS: Out of a complete 14 system review of symptoms, the patient complains only of the following symptoms, and all other reviewed systems are negative.  Cold intolerance, heat intolerance, cough, memory loss    ALLERGIES: Allergies  Allergen Reactions  . Sulfa Antibiotics Diarrhea and Rash  . Sulfasalazine Diarrhea and Rash    HOME MEDICATIONS: Outpatient Medications Prior to Visit  Medication Sig Dispense Refill  . alendronate (FOSAMAX) 70 MG tablet Take 70 mg by mouth once a week. Take with a full glass of water on an empty stomach.    Marland Kitchen apixaban (ELIQUIS) 5 MG TABS tablet Take 1 tablet (5 mg total) by mouth 2 (two) times daily. 60 tablet 11  . Cholecalciferol (VITAMIN D PO) Take by mouth.    . levothyroxine (SYNTHROID, LEVOTHROID) 75 MCG tablet Take 75 mcg by mouth daily before breakfast.     . Memantine HCl-Donepezil HCl (NAMZARIC) 28-10 MG CP24 Take 28 mg by mouth at bedtime. 90 capsule 3  . methylcellulose (ARTIFICIAL TEARS) 1 % ophthalmic solution Place 1 drop into both eyes 4 (four) times daily.     . Omega-3 Fatty Acids (FISH OIL PO) Take by mouth.     No facility-administered medications prior  to visit.     PAST MEDICAL HISTORY: Past Medical History:  Diagnosis Date  . Allergy   . Arthritis   . Asthma   . Colon polyps   . Complete heart block -intermittent    a. 11/2012 s/p SJM Accent DR RF dc ppm.  . Depression   . Environmental allergies   . Hyperlipidemia   . Memory retention disorder 03/22/2013  . Osteoarthritis    hands  . Thyroid disease     PAST SURGICAL HISTORY: Past Surgical History:  Procedure Laterality Date  . APPENDECTOMY  1965  . CERVICAL POLYPECTOMY  1996  . COLONOSCOPY  2007    adenomatous polyps  . EYE SURGERY    . HAND SURGERY    . HIP ARTHROPLASTY Right 01/03/2015   Procedure: ARTHROPLASTY BIPOLAR HIP (HEMIARTHROPLASTY);  Surgeon: [name], MD;  Location: [facility];  Service: Orthopedics;  Laterality: Right;  . INSERT / REPLACE / REMOVE PACEMAKER  12/02/2012    Dr Lovena Le  . KNEE SURGERY    . ovarian cystectomy  1965  . PERMANENT PACEMAKER INSERTION N/A 12/02/2012   Procedure: PERMANENT PACEMAKER INSERTION;  Surgeon: Evans Lance, MD;  Location: [facility] LAB;  Service: Cardiovascular;  Laterality: N/A;  . SALPINGOOPHORECTOMY     right  . tumor removed  1985   abdomen    FAMILY HISTORY: Family History  Problem Relation Age of Onset  . Colon cancer Father   . Colon polyps Brother   . Diabetes Brother        maternal aunt  . Coronary artery disease Brother     SOCIAL HISTORY: Social History   Social History  . Marital status: Married    Spouse name: [name]  . Number of children: 3  . Years of education: College   Occupational History  . elem. school teacher     retired   Social History Main Topics  . Smoking status: Never Smoker  . Smokeless tobacco: Never Used  . Alcohol use 0.0 oz/week     Comment: occas. which is seldom  . Drug use: No  . Sexual activity: Not on file   Other Topics Concern  . Not on file   Social History Narrative   Patient has one daughter, two adopted children(son,daughter).   Patient lives at home with spouse [name]).   Patient is retired.   Patient has a college education.   Patient is right-handed.   Patient drinks very little caffeine.      PHYSICAL EXAM  Vitals:   03/22/17 1036  BP: (!) 92/51  Pulse: 64  Weight: 151 lb 6.4 oz (68.7 kg)  Height: 5\' 6"  (1.676 m)   Body mass index is 24.44 kg/m.   MMSE - Mini Mental State Exam 03/22/2017 09/16/2016 03/19/2016  Orientation to time 0 2 1  Orientation to Place 1 2 2   Registration 3 3 3   Attention/ Calculation 0 1 5  Recall 0 0 0  Language-  name 2 objects 2 2 2   Language- repeat 0 1 1  Language- follow 3 step command 3 3 3   Language- read & follow direction 1 1 1   Write a sentence 1 1 1   Copy design 1 1 1   Total score 12 17 20      Generalized: Well developed, in no acute distress   Neurological examination  Mentation: Alert oriented to time, place, history taking. Follows all commands speech and language fluent Cranial nerve II-XII: Pupils were equal round reactive to light.  Extraocular movements were full, visual field were full on confrontational test. Facial sensation and strength were normal. Uvula tongue midline. Head turning and shoulder shrug  were normal and symmetric. Motor: The motor testing reveals 5 over 5 strength of all 4 extremities. Good symmetric motor tone is noted throughout.  Sensory: Sensory testing is intact to soft touch on all 4 extremities. No evidence of extinction is noted.  Coordination: Cerebellar testing reveals good finger-nose-finger and heel-to-shin bilaterally.  Gait and station: Gait is normal. Tandem gait is normal. Romberg is negative. No drift is seen.  Reflexes: Deep tendon reflexes are symmetric and normal bilaterally.   DIAGNOSTIC DATA (LABS, IMAGING, TESTING) - I reviewed patient records, labs, notes, testing and imaging myself where available.  Lab Results  Component Value Date   WBC 6.5 02/04/2017   HGB 13.2 02/04/2017   HCT 39.6 02/04/2017   MCV 95.2 02/04/2017   PLT 203 02/04/2017      Component Value Date/Time   NA 138 02/04/2017 0937   K 3.9 02/04/2017 0937   CL 104 02/04/2017 0937   CO2 23 02/04/2017 0937   GLUCOSE 80 02/04/2017 0937   BUN 30 (H) 02/04/2017 0937   CREATININE 0.96 (H) 02/04/2017 0937   CALCIUM 9.1 02/04/2017 0937   PROT 6.0 (L) 02/04/2017 0937   ALBUMIN 3.8 02/04/2017 0937   AST 17 02/04/2017 0937   ALT 10 02/04/2017 0937   ALKPHOS 63 02/04/2017 0937   BILITOT 0.4 02/04/2017 0937   GFRNONAA 56 (L) 02/04/2017 0937   GFRAA 65 02/04/2017 0937     Lab Results  Component Value Date   CHOL 203 (H) 02/04/2017   HDL 80 02/04/2017   LDLCALC 103 (H) 02/04/2017   LDLDIRECT 127.6 04/21/2013   TRIG 102 02/04/2017   CHOLHDL 2.5 02/04/2017     Lab Results  Component Value Date   TSH 2.97 02/04/2017      ASSESSMENT AND PLAN 82 y.o. year old female  has a past medical history of Allergy; Arthritis; Asthma; Colon polyps; Complete heart block -intermittent; Depression; Environmental allergies; Hyperlipidemia; Memory retention disorder (03/22/2013); Osteoarthritis; and Thyroid disease. here with:  1. Alzheimer's dementia  The patient's memory score has declined. We will continue to monitor her memory. She will remain on Namzaric. Encouraged patient to continue doing things such as word puzzles, word searches and playing the piano. She should also try remain socially active as possible. Advised that if her symptoms worsen or she develops new symptoms she should let us know. She will follow-up in 6 months or sooner if needed.  I spent 15 minutes with the patient. 50% of this time was spent reviewing the patient's memory score.   Ward Givens, MSN, NP-C 03/22/2017, 11:15 AM [facility] [address], [place], [place] [address] [phone]

## 2017-03-22 NOTE — Patient Instructions (Signed)
Your Plan:  Continue Namzaric Continue word searches, puzzles, playing the piano.  If your symptoms worsen or you develop new symptoms please let us know.   Thank you for coming to see [place] at [facility]. I hope we have been able to provide you high quality care today.  You may receive a patient satisfaction survey over the next few weeks. We would appreciate your feedback and comments so that we may continue to improve ourselves and the health of our patients.

## 2017-05-26 ENCOUNTER — Ambulatory Visit (INDEPENDENT_AMBULATORY_CARE_PROVIDER_SITE_OTHER): Payer: Medicare Other | Admitting: *Deleted

## 2017-05-26 DIAGNOSIS — I442 Atrioventricular block, complete: Secondary | ICD-10-CM | POA: Diagnosis not present

## 2017-05-26 NOTE — Progress Notes (Signed)
Remote pacemaker transmission.   

## 2017-05-27 ENCOUNTER — Encounter: Payer: Self-pay | Admitting: Cardiology

## 2017-05-27 LAB — CUP PACEART REMOTE DEVICE CHECK
Battery Remaining Percentage: 95.5 %
Brady Statistic AP VP Percent: 21 %
Brady Statistic AP VS Percent: 7.5 %
Brady Statistic AS VP Percent: 24 %
Brady Statistic AS VS Percent: 44 %
Date Time Interrogation Session: 20181205081941
Implantable Lead Implant Date: 20140613
Implantable Lead Location: 753860
Lead Channel Impedance Value: 340 Ohm
Lead Channel Pacing Threshold Amplitude: 1 V
Lead Channel Pacing Threshold Pulse Width: 0.5 ms
Lead Channel Setting Pacing Amplitude: 2 V
Lead Channel Setting Pacing Amplitude: 2.5 V
Lead Channel Setting Pacing Pulse Width: 0.5 ms
MDC IDC LEAD IMPLANT DT: 20140613
MDC IDC LEAD LOCATION: 753859
MDC IDC MSMT BATTERY REMAINING LONGEVITY: 107 mo
MDC IDC MSMT BATTERY VOLTAGE: 2.98 V
MDC IDC MSMT LEADCHNL RA IMPEDANCE VALUE: 360 Ohm
MDC IDC MSMT LEADCHNL RA PACING THRESHOLD AMPLITUDE: 0.5 V
MDC IDC MSMT LEADCHNL RA PACING THRESHOLD PULSEWIDTH: 0.5 ms
MDC IDC MSMT LEADCHNL RA SENSING INTR AMPL: 3.7 mV
MDC IDC MSMT LEADCHNL RV SENSING INTR AMPL: 12 mV
MDC IDC PG IMPLANT DT: 20140613
MDC IDC SET LEADCHNL RV SENSING SENSITIVITY: 2 mV
MDC IDC STAT BRADY RA PERCENT PACED: 27 %
MDC IDC STAT BRADY RV PERCENT PACED: 45 %
Pulse Gen Model: 2210
Pulse Gen Serial Number: 7474456

## 2017-06-08 ENCOUNTER — Other Ambulatory Visit: Payer: Self-pay | Admitting: Internal Medicine

## 2017-06-08 NOTE — Telephone Encounter (Signed)
Eliquis 5mg  refill received; pt is 82 yrs old, wt-68.7kg, Crea-0.96 on 01/25/17, last seen by Dr. Acie Fredrickson on 07/22/16 & has a follow-up in January 2019; will send in refill to requested pharmacy.

## 2017-07-06 ENCOUNTER — Ambulatory Visit: Payer: Medicare Other | Admitting: Cardiovascular Disease

## 2017-07-06 ENCOUNTER — Encounter: Payer: Medicare Other | Admitting: Internal Medicine

## 2017-07-08 ENCOUNTER — Ambulatory Visit: Payer: Medicare Other | Admitting: Internal Medicine

## 2017-07-08 ENCOUNTER — Encounter: Payer: Self-pay | Admitting: Internal Medicine

## 2017-07-08 VITALS — BP 102/68 | HR 70 | Temp 98.7°F | Wt 143.0 lb

## 2017-07-08 DIAGNOSIS — J3489 Other specified disorders of nose and nasal sinuses: Secondary | ICD-10-CM | POA: Diagnosis not present

## 2017-07-08 DIAGNOSIS — R413 Other amnesia: Secondary | ICD-10-CM

## 2017-07-08 NOTE — Progress Notes (Signed)
   Subjective:    Patient ID: [name], female    DOB: 02-21-1935, 83 y.o.   MRN: [id number]  HPI 83 year old Female with history of dementia which seems to be getting worse with complaint of chronic  rhinorrhea.  I have recommended Flonase nasal spray in the past but daughter who accompanies her says that she does not think patient will comply with this.  She is tried over-the-counter antihistamines without success.  She constantly blows her nose and fidgets quite a bit.  It may very well be part of the dementia.  No prior history of allergic rhinitis and is never been allergy tested.  Apparently tries to stick clinics up her nose at times.  Offered medication for agitation but family declines.  She is fairly self-sufficient.  She is able to dress herself and take care of dogs.  She lives with her husband.  She feeds herself.  At times her weight.  Has been apparently fatigued recently.  She had lab work done in August which was normal and I reviewed that with daughter today.  She is followed  by Neurology.  Was seen by Neurology in October.  In October, memory score had declined.    Review of Systems see above     Objective:   Physical Exam She clears her throat a lot.  She coughs occasionally but it seems to be a dry cough.  TMs and pharynx are clear.  Neck is supple.  Chest is clear.  She fidgets a lot.       Assessment & Plan:  Complaint of rhinorrhea  Occasional cough  ?  Allergic rhinitis  Plan: Recommend that she see allergist regarding chronic rhinorrhea.  Family says that her nose runs a lot.  Not sure if she would have a foreign body in her nostrils.  Family declines mammogram and they are not sure if she would comply with allergy testing but I think she might.  I have recommended Flonase nasal spray 2 sprays in each nostril daily.  With regard to fatigue, labs in August were stable and I think this is probably part of the dementia.  We can check B12 level and TSH once  again but she had no macrocytosis in August.  25 minutes spent with patient and her daughter half of which time was counseling daughter with regard to what to expect with dementia.

## 2017-07-08 NOTE — Patient Instructions (Signed)
To have allergy testing in the near future.  Check B12 and TSH.

## 2017-07-13 ENCOUNTER — Encounter: Payer: Self-pay | Admitting: Internal Medicine

## 2017-07-13 ENCOUNTER — Other Ambulatory Visit: Payer: Medicare Other | Admitting: Internal Medicine

## 2017-07-13 ENCOUNTER — Ambulatory Visit: Payer: Medicare Other | Admitting: Internal Medicine

## 2017-07-13 VITALS — BP 124/62 | HR 72 | Resp 16 | Ht 66.0 in | Wt 142.0 lb

## 2017-07-13 DIAGNOSIS — R413 Other amnesia: Secondary | ICD-10-CM

## 2017-07-13 DIAGNOSIS — I442 Atrioventricular block, complete: Secondary | ICD-10-CM | POA: Diagnosis not present

## 2017-07-13 DIAGNOSIS — Z950 Presence of cardiac pacemaker: Secondary | ICD-10-CM | POA: Diagnosis not present

## 2017-07-13 NOTE — Progress Notes (Signed)
HPI [name] returns today for followup. She is a very pleasant 83 year old woman with a history of symptomatic bradycardia, secondary to sinus node dysfunction, status post permanent pacemaker insertion. She also has a history of heart block. In the interim, she has been stable. She denies chest pain. No peripheral edema. No symptomatic SVT. Her husband is in charge of her medications. She admits to being sedentary. Allergies  Allergen Reactions  . Sulfa Antibiotics Diarrhea and Rash  . Sulfasalazine Diarrhea and Rash     Current Outpatient Medications  Medication Sig Dispense Refill  . alendronate (FOSAMAX) 70 MG tablet Take 70 mg by mouth once a week. Take with a full glass of water on an empty stomach.    . Cholecalciferol (VITAMIN D PO) Take by mouth.    [name] 5 MG TABS tablet TAKE 1 TABLET(5 MG) BY MOUTH TWICE DAILY 60 tablet 8  . levothyroxine (SYNTHROID, LEVOTHROID) 75 MCG tablet Take 75 mcg by mouth daily before breakfast.     . Memantine HCl-Donepezil HCl (NAMZARIC) 28-10 MG CP24 Take 28 mg by mouth at bedtime. 90 capsule 3  . methylcellulose (ARTIFICIAL TEARS) 1 % ophthalmic solution Place 1 drop into both eyes 4 (four) times daily.     . Omega-3 Fatty Acids (FISH OIL PO) Take by mouth.     No current facility-administered medications for this visit.      Past Medical History:  Diagnosis Date  . Allergy   . Arthritis   . Asthma   . Colon polyps   . Complete heart block -intermittent    a. 11/2012 s/p SJM Accent DR RF dc ppm.  . Depression   . Environmental allergies   . Hyperlipidemia   . Memory retention disorder 03/22/2013  . Osteoarthritis    hands  . Thyroid disease     ROS:   All systems reviewed and negative except as noted in the HPI.   Past Surgical History:  Procedure Laterality Date  . APPENDECTOMY  1965  . CERVICAL POLYPECTOMY  1996  . COLONOSCOPY  2007   adenomatous polyps  . EYE SURGERY    . HAND SURGERY    . HIP ARTHROPLASTY  Right 01/03/2015   Procedure: ARTHROPLASTY BIPOLAR HIP (HEMIARTHROPLASTY);  Surgeon: [name], MD;  Location: [facility];  Service: Orthopedics;  Laterality: Right;  . INSERT / REPLACE / REMOVE PACEMAKER  12/02/2012    Dr Lovena Le  . KNEE SURGERY    . ovarian cystectomy  1965  . PERMANENT PACEMAKER INSERTION N/A 12/02/2012   Procedure: PERMANENT PACEMAKER INSERTION;  Surgeon: Evans Lance, MD;  Location: [facility] CATH LAB;  Service: Cardiovascular;  Laterality: N/A;  . SALPINGOOPHORECTOMY     right  . tumor removed  1985   abdomen     Family History  Problem Relation Age of Onset  . Colon cancer Father   . Colon polyps Brother   . Diabetes Brother        maternal aunt  . Coronary artery disease Brother      Social History   Socioeconomic History  . Marital status: Married    Spouse name: [name]  . Number of children: 3  . Years of education: College  . Highest education level: Not on file  Social Needs  . Financial resource strain: Not on file  . Food insecurity - worry: Not on file  . Food insecurity - inability: Not on file  . Transportation needs - medical: Not on  file  . Transportation needs - non-medical: Not on file  Occupational History  . Occupation: elem. school teacher    Comment: retired  Tobacco Use  . Smoking status: Never Smoker  . Smokeless tobacco: Never Used  Substance and Sexual Activity  . Alcohol use: Yes    Alcohol/week: 0.0 oz    Comment: occas. which is seldom  . Drug use: No  . Sexual activity: Not on file  Other Topics Concern  . Not on file  Social History Narrative   Patient has one daughter, two adopted children(son,daughter).   Patient lives at home with spouse [name]).   Patient is retired.   Patient has a college education.   Patient is right-handed.   Patient drinks very little caffeine.     BP 124/62   Pulse 72   Resp 16   Ht 5\' 6"  (1.676 m)   Wt 142 lb (64.4 kg)   SpO2 98%   BMI 22.92 kg/m   Physical Exam:  Well  appearing NAD HEENT: Unremarkable Neck:  No JVD, no thyromegally Lymphatics:  No adenopathy Back:  No CVA tenderness Lungs:  Clear with no wheezes HEART:  Regular rate rhythm, no murmurs, no rubs, no clicks Abd:  soft, positive bowel sounds, no organomegally, no rebound, no guarding Ext:  2 plus pulses, no edema, no cyanosis, no clubbing Skin:  No rashes no nodules Neuro:  CN II through XII intact, motor grossly intact  EKG - nsr with atrial pacing  DEVICE  Normal device function.  See PaceArt for details.   Assess/Plan: 1. Sinus node dysfunction - she is stable s/p PPM 2. PPM - her St. Jude DDD PM is working normally.  3. Dementia - she appears to have stabilized.   Mikle Bosworth.D.

## 2017-07-13 NOTE — Addendum Note (Signed)
Addended by: Mady Haagensen on: 07/13/2017 11:15 AM   Modules accepted: Orders

## 2017-07-13 NOTE — Patient Instructions (Addendum)
Medication Instructions:  Your physician recommends that you continue on your current medications as directed. Please refer to the Current Medication list given to you today.  Labwork: None ordered.  Testing/Procedures: None ordered.  Follow-Up: Your physician wants you to follow-up in: One Year with Dr Lovena Le. You will receive a reminder letter in the mail two months in advance. If you don't receive a letter, please call our office to schedule the follow-up appointment.  Remote monitoring is used to monitor your Pacemaker from home. This monitoring reduces the number of office visits required to check your device to one time per year. It allows [place] to keep an eye on the functioning of your device to ensure it is working properly. You are scheduled for a device check from home on 08/25/2017 . You may send your transmission at any time that day. If you have a wireless device, the transmission will be sent automatically. After your physician reviews your transmission, you will receive a postcard with your next transmission date.   Any Other Special Instructions Will Be Listed Below (If Applicable).   If you need a refill on your cardiac medications before your next appointment, please call your pharmacy.

## 2017-07-14 LAB — VITAMIN B12: VITAMIN B 12: 1048 pg/mL (ref 200–1100)

## 2017-07-26 ENCOUNTER — Telehealth: Payer: Self-pay | Admitting: Internal Medicine

## 2017-07-26 NOTE — Telephone Encounter (Signed)
Husband came in today and is concerned because patient will not take a bath.  States that he cannot get her to get in the bath tub at all.  Wanted to bring her into the office to see Dr. Renold Genta.  Advised I would speak to Dr. Renold Genta and call him back.    Spoke with Dr. Renold Genta and she advised let's try to get 1 bath a week and the rest of the time, go for bird baths in the sink.    Called husband back; provided Dr. Verlene Mayer advice.  He states that she refuses to even talk about a bath at all.  I asked him if he wanted me to speak with the patient and he did.  Patient is afraid of falling.  They do have a grab bar in the bath tub and they have a bath chair as well.  We spoke about her using the grab bar for safety and allowing the caregiver to assist her while getting in the tub and holding on to the grab bar for safety as well.  She agreed to try to take 1 bath a week.  Patient states she has already been doing bird baths under her arms and between her legs.  She did agree to try the tub once a week.  She has a huge fear of falling and getting hurt while getting in the tub.

## 2017-08-25 ENCOUNTER — Ambulatory Visit (INDEPENDENT_AMBULATORY_CARE_PROVIDER_SITE_OTHER): Payer: Medicare Other | Admitting: *Deleted

## 2017-08-25 DIAGNOSIS — I442 Atrioventricular block, complete: Secondary | ICD-10-CM

## 2017-08-25 NOTE — Progress Notes (Signed)
Remote pacemaker transmission.   

## 2017-08-26 ENCOUNTER — Encounter: Payer: Self-pay | Admitting: Cardiology

## 2017-08-31 LAB — CUP PACEART REMOTE DEVICE CHECK
Implantable Lead Implant Date: 20140613
Implantable Lead Location: 753859
Implantable Pulse Generator Implant Date: 20140613
MDC IDC LEAD IMPLANT DT: 20140613
MDC IDC LEAD LOCATION: 753860
MDC IDC SESS DTM: 20190312211004
Pulse Gen Model: 2210
Pulse Gen Serial Number: 7474456

## 2017-09-20 ENCOUNTER — Encounter: Payer: Self-pay | Admitting: Adult Health

## 2017-09-20 ENCOUNTER — Other Ambulatory Visit: Payer: Self-pay | Admitting: Neurology

## 2017-09-20 ENCOUNTER — Ambulatory Visit: Payer: Medicare Other | Admitting: Adult Health

## 2017-09-20 VITALS — BP 118/62 | HR 69 | Ht 66.0 in | Wt 138.4 lb

## 2017-09-20 DIAGNOSIS — R413 Other amnesia: Secondary | ICD-10-CM | POA: Diagnosis not present

## 2017-09-20 NOTE — Progress Notes (Signed)
PATIENT: [name] DOB: [birth year]  REASON FOR VISIT: follow up HISTORY FROM: patient  HISTORY OF PRESENT ILLNESS: Today 09/20/17 [name] is an 83 year old female with a history of Alzheimer's disease.  She returns today for follow-up.  She remains on Namzaric and is tolerating it well.  She is here today with her husband.  She lives at home with her husband but has a caregiver that comes in regularly to help her.  She does require some assistance with ADLs such as picking out her clothes.  Her husband now manages all of her medications, finances and assist with meal preparation.  Patient denies any changes in appetite.  Denies any changes in mood or behavior.  Denies hallucinations or agitation.  Patient returns today for an evaluation.   HISTORY  03/22/17 [name] is an 83 year old female with a history of Alzheimer's disease. She returns today for follow-up. She continues to live at home with her husband. She does have a caregiver [name] that comes and helps her daily. She does require prompting with ADLs. Her husband manages her medication. She states at times she does have a trouble falling asleep. Her husband manages all the finances. She no longer cooks meals. Her husband helps her with breakfast. They typically go out  for lunch and dinner. The patient remains on namzaric. Caregiver denies any significant agitation or aggressiveness. Denies any new neurological symptoms. He returns today for an evaluation.     REVIEW OF SYSTEMS: Out of a complete 14 system review of symptoms, the patient complains only of the following symptoms, and all other reviewed systems are negative.  See HPI  ALLERGIES: Allergies  Allergen Reactions  . Sulfa Antibiotics Diarrhea and Rash  . Sulfasalazine Diarrhea and Rash    HOME MEDICATIONS: Outpatient Medications Prior to Visit  Medication Sig Dispense Refill  . alendronate (FOSAMAX) 70 MG tablet Take 70 mg by mouth once a week. Take with a  full glass of water on an empty stomach.    . Cholecalciferol (VITAMIN D PO) Take by mouth.    Arne Cleveland 5 MG TABS tablet TAKE 1 TABLET(5 MG) BY MOUTH TWICE DAILY 60 tablet 8  . levothyroxine (SYNTHROID, LEVOTHROID) 75 MCG tablet Take 75 mcg by mouth daily before breakfast.     . Memantine HCl-Donepezil HCl (NAMZARIC) 28-10 MG CP24 Take 28 mg by mouth at bedtime. 90 capsule 3  . methylcellulose (ARTIFICIAL TEARS) 1 % ophthalmic solution Place 1 drop into both eyes 4 (four) times daily.     . Omega-3 Fatty Acids (FISH OIL PO) Take by mouth.     No facility-administered medications prior to visit.     PAST MEDICAL HISTORY: Past Medical History:  Diagnosis Date  . Allergy   . Arthritis   . Asthma   . Colon polyps   . Complete heart block -intermittent    a. 11/2012 s/p SJM Accent DR RF dc ppm.  . Depression   . Environmental allergies   . Hyperlipidemia   . Memory retention disorder 03/22/2013  . Osteoarthritis    hands  . Thyroid disease     PAST SURGICAL HISTORY: Past Surgical History:  Procedure Laterality Date  . APPENDECTOMY  1965  . CERVICAL POLYPECTOMY  1996  . COLONOSCOPY  2007   adenomatous polyps  . EYE SURGERY    . HAND SURGERY    . HIP ARTHROPLASTY Right 01/03/2015   Procedure: ARTHROPLASTY BIPOLAR HIP (HEMIARTHROPLASTY);  Surgeon: [name], MD;  Location: [facility];  Service: Orthopedics;  Laterality: Right;  . INSERT / REPLACE / REMOVE PACEMAKER  12/02/2012    Dr Lovena Le  . KNEE SURGERY    . ovarian cystectomy  1965  . PERMANENT PACEMAKER INSERTION N/A 12/02/2012   Procedure: PERMANENT PACEMAKER INSERTION;  Surgeon: Evans Lance, MD;  Location: [facility] CATH LAB;  Service: Cardiovascular;  Laterality: N/A;  . SALPINGOOPHORECTOMY     right  . tumor removed  1985   abdomen    FAMILY HISTORY: Family History  Problem Relation Age of Onset  . Colon cancer Father   . Colon polyps Brother   . Diabetes Brother        maternal aunt  . Coronary artery disease  Brother     SOCIAL HISTORY: Social History   Socioeconomic History  . Marital status: Married    Spouse name: [name]  . Number of children: 3  . Years of education: College  . Highest education level: Not on file  Occupational History  . 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 Financial resource strain: Not on file  . Food insecurity:    Worry: Not on file    Inability: Not on file  . Transportation needs:    Medical: Not on file    Non-medical: Not on file  Tobacco Use  . Smoking status: Never Smoker  . Smokeless tobacco: Never Used  Substance and Sexual Activity  . Alcohol use: Yes    Alcohol/week: 0.0 oz    Comment: occas. which is seldom  . Drug use: No  . Sexual activity: Not on file  Lifestyle  . Physical activity:    Days per week: Not on file    Minutes per session: Not on file  . Stress: Not on file  Relationships  . Social connections:    Talks on phone: Not on file    Gets together: Not on file    Attends religious service: Not on file    Active member of club or organization: Not on file    Attends meetings of clubs or organizations: Not on file    Relationship status: Not on file  . Intimate partner violence:    Fear of current or ex partner: Not on file    Emotionally abused: Not on file    Physically abused: Not on file    Forced sexual activity: Not on file  Other Topics Concern  . Not on file  Social History Narrative   Patient has one daughter, two adopted children(son,daughter).   Patient lives at home with spouse [name]).   Patient is retired.   Patient has a college education.   Patient is right-handed.   Patient drinks very little caffeine.      PHYSICAL EXAM  Vitals:   09/20/17 1030  BP: 118/62  Pulse: 69  Weight: 138 lb 6.4 oz (62.8 kg)  Height: 5\' 6"  (1.676 m)   Body mass index is 22.34 kg/m.   MMSE - Mini Mental State Exam 09/20/2017 03/22/2017 09/16/2016  Orientation to time 1 0 2    Orientation to Place 0 1 2  Registration 3 3 3   Attention/ Calculation 5 0 1  Recall 0 0 0  Language- name 2 objects 2 2 2   Language- repeat 0 0 1  Language- follow 3 step command 3 3 3   Language- read & follow direction 1 1 1   Write a sentence 1 1 1   Copy design  1 1 1   Total score 17 12 17      Generalized: Well developed, in no acute distress   Neurological examination  Mentation: Alert oriented to time, place, history taking. Follows all commands speech and language fluent Cranial nerve II-XII: Pupils were equal round reactive to light. Extraocular movements were full, visual field were full on confrontational test. Facial sensation and strength were normal. Uvula tongue midline. Head turning and shoulder shrug  were normal and symmetric. Motor: The motor testing reveals 5 over 5 strength of all 4 extremities. Good symmetric motor tone is noted throughout.  Sensory: Sensory testing is intact to soft touch on all 4 extremities. No evidence of extinction is noted.  Coordination: Cerebellar testing reveals good finger-nose-finger and heel-to-shin bilaterally.  Gait and station: Gait is normal. Tandem gait is normal. Romberg is negative. No drift is seen.  Reflexes: Deep tendon reflexes are symmetric and normal bilaterally.   DIAGNOSTIC DATA (LABS, IMAGING, TESTING) - I reviewed patient records, labs, notes, testing and imaging myself where available.  Lab Results  Component Value Date   WBC 6.5 02/04/2017   HGB 13.2 02/04/2017   HCT 39.6 02/04/2017   MCV 95.2 02/04/2017   PLT 203 02/04/2017      Component Value Date/Time   NA 138 02/04/2017 0937   K 3.9 02/04/2017 0937   CL 104 02/04/2017 0937   CO2 23 02/04/2017 0937   GLUCOSE 80 02/04/2017 0937   BUN 30 (H) 02/04/2017 0937   CREATININE 0.96 (H) 02/04/2017 0937   CALCIUM 9.1 02/04/2017 0937   PROT 6.0 (L) 02/04/2017 0937   ALBUMIN 3.8 02/04/2017 0937   AST 17 02/04/2017 0937   ALT 10 02/04/2017 0937   ALKPHOS 63  02/04/2017 0937   BILITOT 0.4 02/04/2017 0937   GFRNONAA 56 (L) 02/04/2017 0937   GFRAA 65 02/04/2017 0937   Lab Results  Component Value Date   CHOL 203 (H) 02/04/2017   HDL 80 02/04/2017   LDLCALC 103 (H) 02/04/2017   LDLDIRECT 127.6 04/21/2013   TRIG 102 02/04/2017   CHOLHDL 2.5 02/04/2017   Lab Results  Component Value Date   HGBA1C 5.9 (H) 03/05/2014   Lab Results  Component Value Date   VITAMINB12 1,048 07/13/2017   Lab Results  Component Value Date   TSH 2.97 02/04/2017      ASSESSMENT AND PLAN 83 y.o. year old female  has a past medical history of Allergy, Arthritis, Asthma, Colon polyps, Complete heart block -intermittent, Depression, Environmental allergies, Hyperlipidemia, Memory retention disorder (03/22/2013), Osteoarthritis, and Thyroid disease. here with :  1.  Memory disturbance  The patient's memory score has declined since the last visit.  She will continue on the namzaric.  We will continue to monitor her symptoms.  She is encouraged to remain social and participate in word puzzles word searches and suduko. If her symptoms worsen or she develops new symptoms she should let us know.  She will follow-up in 6 months or sooner if needed.   I spent 15 minutes with the patient. 50% of this time was spent discussing her memory testing.   Ward Givens, MSN, NP-C 09/20/2017, 10:50 AM [facility] [address], [place], [place] [address] [phone]

## 2017-09-20 NOTE — Progress Notes (Signed)
I have read the note, and I agree with the clinical assessment and plan.  Elwyn Lowden K Carmellia Kreisler   

## 2017-09-20 NOTE — Patient Instructions (Addendum)
Your Plan:  Continue namzaric Memory score has declined since last visit.  Continue to be social, participate in word puzzles, word searches and suduko puzzels.  If your symptoms worsen or you develop new symptoms please let us know.   Thank you for coming to see [place] at [facility]. I hope we have been able to provide you high quality care today.  You may receive a patient satisfaction survey over the next few weeks. We would appreciate your feedback and comments so that we may continue to improve ourselves and the health of our patients.

## 2017-09-21 ENCOUNTER — Ambulatory Visit: Payer: Medicare Other | Admitting: Cardiovascular Disease

## 2017-10-04 ENCOUNTER — Encounter: Payer: Self-pay | Admitting: Internal Medicine

## 2017-10-04 ENCOUNTER — Ambulatory Visit: Payer: Medicare Other | Admitting: Internal Medicine

## 2017-10-04 VITALS — BP 110/60 | HR 68 | Temp 98.1°F | Wt 139.0 lb

## 2017-10-04 DIAGNOSIS — R350 Frequency of micturition: Secondary | ICD-10-CM | POA: Diagnosis not present

## 2017-10-04 DIAGNOSIS — R300 Dysuria: Secondary | ICD-10-CM

## 2017-10-04 DIAGNOSIS — K5900 Constipation, unspecified: Secondary | ICD-10-CM | POA: Diagnosis not present

## 2017-10-04 DIAGNOSIS — R413 Other amnesia: Secondary | ICD-10-CM | POA: Diagnosis not present

## 2017-10-04 DIAGNOSIS — F419 Anxiety disorder, unspecified: Secondary | ICD-10-CM | POA: Diagnosis not present

## 2017-10-04 LAB — POCT URINALYSIS DIPSTICK
BILIRUBIN UA: NEGATIVE
Glucose, UA: NEGATIVE
KETONES UA: NEGATIVE
Leukocytes, UA: NEGATIVE
Nitrite, UA: NEGATIVE
Spec Grav, UA: 1.02 (ref 1.010–1.025)
UROBILINOGEN UA: 0.2 U/dL
pH, UA: 6 (ref 5.0–8.0)

## 2017-10-04 MED ORDER — LORAZEPAM 0.5 MG PO TABS: 0.5000 mg | ORAL_TABLET | Freq: Two times a day (BID) | ORAL | 1 refills | Status: DC | PRN

## 2017-10-04 MED ORDER — NITROFURANTOIN MONOHYD MACRO 100 MG PO CAPS: 100.0000 mg | ORAL_CAPSULE | Freq: Two times a day (BID) | ORAL | 0 refills | Status: DC

## 2017-10-04 NOTE — Progress Notes (Signed)
   Subjective:    Patient ID: [name], female    DOB: 03/29/1935, 83 y.o.   MRN: [id number]  HPI 83 year old Female with dementia brought in today by her caretaker and husband.  She is been having some urinary frequency and apparent dysuria.  Apparently had UTI December 2017(Klebisella pneumoniae) treated with Keflex.  Apparent issues with constipation and recommended MiraLAX daily.  Her husband plans a trip to see family out of state.  He is concerned that she may not want to ride in the car very long and is requesting small dose of antianxiety medication.  Prescribed Ativan 0.5 mg twice daily as needed for anxiety.    Review of Systems     Objective:   Physical Exam Urine dipstick is abnormal.  Culture was taken.  Acute UTI  Anxiety-Ativan prescribed  Memory loss/dementia  Constipation-MiraLAX prescribed  Plan: She will start MiraLAX daily.  Ativan 0.5 mg twice daily as needed for anxiety.  Begin Macrobid 100 mg twice daily with culture pending.       Assessment & Plan:  Probable acute UTI.  Culture pending  Anxiety  Memory loss/dementia  Constipation  Plan: She will start MiraLAX daily.  Ativan 0.5 mg twice daily as needed for anxiety.  Macrobid 100 mg twice daily for 10 days.  Addendum: 4/16 2019.  Urine culture/appears to been a contaminant with 3 or more organisms  present.  She will finish course of Macrobid.

## 2017-10-04 NOTE — Patient Instructions (Addendum)
Ativan 0.5 mg twice daily as needed for anxiety Miralax 1 tablespoon in 8 ounces of water daily Macrobid 100 mg twice daily for 10 days with urine culture pending.

## 2017-10-05 LAB — URINE CULTURE
MICRO NUMBER:: 90460324
SPECIMEN QUALITY:: ADEQUATE

## 2017-10-12 ENCOUNTER — Telehealth: Payer: Self-pay | Admitting: Internal Medicine

## 2017-10-12 NOTE — Telephone Encounter (Signed)
[name] Spouse [phone]  Ferd Hibbs to see if [name]'s culture is back.

## 2017-10-12 NOTE — Telephone Encounter (Signed)
Left detailed message.   

## 2017-10-12 NOTE — Telephone Encounter (Signed)
It did not have significant growth. Finish antibiotic and see if symptoms improve.

## 2017-10-20 ENCOUNTER — Encounter (INDEPENDENT_AMBULATORY_CARE_PROVIDER_SITE_OTHER): Payer: Self-pay

## 2017-10-20 ENCOUNTER — Ambulatory Visit: Payer: Medicare Other | Admitting: Physician Assistant

## 2017-10-20 ENCOUNTER — Encounter: Payer: Self-pay | Admitting: Physician Assistant

## 2017-10-20 VITALS — BP 102/68 | HR 67 | Ht 66.0 in | Wt 136.0 lb

## 2017-10-20 DIAGNOSIS — R5383 Other fatigue: Secondary | ICD-10-CM | POA: Diagnosis not present

## 2017-10-20 DIAGNOSIS — I442 Atrioventricular block, complete: Secondary | ICD-10-CM | POA: Diagnosis not present

## 2017-10-20 DIAGNOSIS — I480 Paroxysmal atrial fibrillation: Secondary | ICD-10-CM

## 2017-10-20 DIAGNOSIS — E782 Mixed hyperlipidemia: Secondary | ICD-10-CM

## 2017-10-20 DIAGNOSIS — Z79899 Other long term (current) drug therapy: Secondary | ICD-10-CM

## 2017-10-20 NOTE — Progress Notes (Signed)
Cardiology Office Note    Date:  10/20/2017   ID:  [name], DOB 1935/02/10, MRN [id number]  PCP:  Elby Showers, MD  Cardiologist:   Darlin Coco MD >>> Dr. Acie Fredrickson  Electrophysiologist: Dr. Lovena Le  Chief Complaint: Yearly follow up  History of Present Illness:   [name] is a 83 y.o. female symptomatic bradycardia secondary to sinus node dysfunction, CHB status post st[facility] permanent pacemaker insertion, hypothyroidism, HLD, dementia and PAF presents for yearly follow up.   She was doing well on cardiac stand point when last seen by Dr. Acie Fredrickson 06/2016 & Dr. Lovena Le 06/2017.  Here today for follow up.  No specific complaints except fatigue and tired.  Recently treated for UTI and constipation.  Denies chest pain or shortness of breath with ambulation.  No regular exercise but walks mall and grocery shopping center.  Denies blood in stool, melena, palpitations, chest pain, shortness of breath, orthopnea or syncope.  History unreliable due to dementia.  Here with caregiver.   Past Medical History:  Diagnosis Date  . Allergy   . Arthritis   . Asthma   . Colon polyps   . Complete heart block -intermittent    a. 11/2012 s/p SJM Accent DR RF dc ppm.  . Depression   . Environmental allergies   . Hyperlipidemia   . Memory retention disorder 03/22/2013  . Osteoarthritis    hands  . Thyroid disease     Past Surgical History:  Procedure Laterality Date  . APPENDECTOMY  1965  . CERVICAL POLYPECTOMY  1996  . COLONOSCOPY  2007   adenomatous polyps  . EYE SURGERY    . HAND SURGERY    . HIP ARTHROPLASTY Right 01/03/2015   Procedure: ARTHROPLASTY BIPOLAR HIP (HEMIARTHROPLASTY);  Surgeon: [name], MD;  Location: [facility];  Service: Orthopedics;  Laterality: Right;  . INSERT / REPLACE / REMOVE PACEMAKER  12/02/2012    Dr Lovena Le  . KNEE SURGERY    . ovarian cystectomy  1965  . PERMANENT PACEMAKER INSERTION N/A 12/02/2012   Procedure: PERMANENT PACEMAKER INSERTION;  Surgeon:  Evans Lance, MD;  Location: [facility] CATH LAB;  Service: Cardiovascular;  Laterality: N/A;  . SALPINGOOPHORECTOMY     right  . tumor removed  1985   abdomen    Current Medications: Prior to Admission medications   Medication Sig Start Date End Date Taking? Authorizing Provider  alendronate (FOSAMAX) 70 MG tablet Take 70 mg by mouth once a week. Take with a full glass of water on an empty stomach.    [provider]  Cholecalciferol (VITAMIN D PO) Take by mouth.    [provider]  ELIQUIS 5 MG TABS tablet TAKE 1 TABLET(5 MG) BY MOUTH TWICE DAILY 06/08/17   Nahser, Wonda Cheng, MD  levothyroxine (SYNTHROID, LEVOTHROID) 75 MCG tablet Take 75 mcg by mouth daily before breakfast.  12/13/14   [provider]  LORazepam (ATIVAN) 0.5 MG tablet Take 1 tablet (0.5 mg total) by mouth 2 (two) times daily as needed for anxiety. 10/04/17   Elby Showers, MD  methylcellulose (ARTIFICIAL TEARS) 1 % ophthalmic solution Place 1 drop into both eyes 4 (four) times daily.     [provider]  NAMZARIC 28-10 MG CP24 TAKE 1 CAPSULE BY MOUTH AT BEDTIME 09/20/17   Ward Givens, NP  nitrofurantoin, macrocrystal-monohydrate, (MACROBID) 100 MG capsule Take 1 capsule (100 mg total) by mouth 2 (two) times daily. 10/04/17   Elby Showers,  MD  Omega-3 Fatty Acids (FISH OIL PO) Take by mouth.    [provider]    Allergies:   Sulfa antibiotics and Sulfasalazine   Social History   Socioeconomic History  . Marital status: Married    Spouse name: [name]  . Number of children: 3  . Years of education: College  . Highest education level: Not on file  Occupational History  . 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 Financial resource strain: Not on file  . Food insecurity:    Worry: Not on file    Inability: Not on file  . Transportation needs:    Medical: Not on file    Non-medical: Not on file  Tobacco Use  . Smoking status: Never Smoker    . Smokeless tobacco: Never Used  Substance and Sexual Activity  . Alcohol use: Yes    Alcohol/week: 0.0 oz    Comment: occas. which is seldom  . Drug use: No  . Sexual activity: Not on file  Lifestyle  . Physical activity:    Days per week: Not on file    Minutes per session: Not on file  . Stress: Not on file  Relationships  . Social connections:    Talks on phone: Not on file    Gets together: Not on file    Attends religious service: Not on file    Active member of club or organization: Not on file    Attends meetings of clubs or organizations: Not on file    Relationship status: Not on file  Other Topics Concern  . Not on file  Social History Narrative   Patient has one daughter, two adopted children(son,daughter).   Patient lives at home with spouse [name]).   Patient is retired.   Patient has a college education.   Patient is right-handed.   Patient drinks very little caffeine.     Family History:  The patient's family history includes Colon cancer in her father; Colon polyps in her brother; Coronary artery disease in her brother; Diabetes in her brother.   ROS:   Please see the history of present illness.    ROS All other systems reviewed and are negative.   PHYSICAL EXAM:   VS:  BP 102/68   Pulse 67   Ht 5\' 6"  (1.676 m)   Wt 136 lb (61.7 kg)   SpO2 96%   BMI 21.95 kg/m    GEN: Well nourished, well developed, in no acute distress  HEENT: normal  Neck: no JVD, carotid bruits, or masses Cardiac: RRR; no murmurs, rubs, or gallops,no edema  Respiratory:  clear to auscultation bilaterally, normal work of breathing GI: soft, nontender, nondistended, + BS MS: no deformity or atrophy  Skin: warm and dry, no rash Neuro:  Alert and Oriented x 3, Strength and sensation are intact Psych: euthymic mood, full affect  Wt Readings from Last 3 Encounters:  10/20/17 136 lb (61.7 kg)  10/04/17 139 lb (63 kg)  09/20/17 138 lb 6.4 oz (62.8 kg)      Studies/Labs  Reviewed:   EKG:  EKG is not ordered today.    Recent Labs: 02/04/2017: ALT 10; BUN 30; Creat 0.96; Hemoglobin 13.2; Platelets 203; Potassium 3.9; Sodium 138; TSH 2.97   Lipid Panel    Component Value Date/Time   CHOL 203 (H) 02/04/2017 0937   TRIG 102 02/04/2017 0937   HDL 80 02/04/2017 0937   CHOLHDL 2.5 02/04/2017 1829  VLDL 20 02/04/2017 0937   LDLCALC 103 (H) 02/04/2017 0937   LDLDIRECT 127.6 04/21/2013 1127    Additional studies/ records that were reviewed today include:   Echocardiogram: 11/2012 Study Conclusions  Left ventricle: The cavity size was normal. Wall thickness was normal. Systolic function was normal. The estimated ejection fraction was in the range of 60% to 65%. Wall motion was normal; there were no regional wall motion abnormalities.      ASSESSMENT & PLAN:    1. PAF - Continue Eliquis.   2. Sinus Node Dysfunction s/p PPM - Followed by Dr. Lovena Le  3. Fatigue - likely due to decondition vs recent UTI. However given constipation and being on anticoagulation. Will check CBC and BMET.    Encouraged daily exercise.             Medication Adjustments/Labs and Tests Ordered: Current medicines are reviewed at length with the patient today.  Concerns regarding medicines are outlined above.  Medication changes, Labs and Tests ordered today are listed in the Patient Instructions below. Patient Instructions  Medication Instructions: Your physician recommends that you continue on your current medications as directed. Please refer to the Current Medication list given to you today.  Labwork: Your physician has recommended that you have lab work today: BMET and CBC   Procedures/Testing: None Ordered  Follow-Up: Your physician wants you to follow-up in: 1 YEAR with Dr. Acie Fredrickson. You will receive a reminder letter in the mail two months in advance. If you don't receive a letter, please call our office to schedule the follow-up appointment.   If you need  a refill on your cardiac medications before your next appointment, please call your pharmacy.       Jarrett Soho, [place]  10/20/2017 11:20 AM    [facility] [address], [place], [place]  [address] Phone: [phone]; Fax: [phone]

## 2017-10-20 NOTE — Patient Instructions (Addendum)
Medication Instructions: Your physician recommends that you continue on your current medications as directed. Please refer to the Current Medication list given to you today.  Labwork: Your physician has recommended that you have lab work today: BMET and CBC   Procedures/Testing: None Ordered  Follow-Up: Your physician wants you to follow-up in: 1 YEAR with Dr. Acie Fredrickson. You will receive a reminder letter in the mail two months in advance. If you don't receive a letter, please call our office to schedule the follow-up appointment.   If you need a refill on your cardiac medications before your next appointment, please call your pharmacy.

## 2017-10-21 ENCOUNTER — Other Ambulatory Visit: Payer: Self-pay | Admitting: Cardiovascular Disease

## 2017-10-21 LAB — CBC WITH DIFFERENTIAL/PLATELET
BASOS ABS: 0 10*3/uL (ref 0.0–0.2)
Basos: 0 %
EOS (ABSOLUTE): 0.2 10*3/uL (ref 0.0–0.4)
Eos: 3 %
HEMATOCRIT: 43.3 % (ref 34.0–46.6)
HEMOGLOBIN: 14.3 g/dL (ref 11.1–15.9)
IMMATURE GRANS (ABS): 0 10*3/uL (ref 0.0–0.1)
IMMATURE GRANULOCYTES: 0 %
LYMPHS: 10 %
Lymphocytes Absolute: 0.9 10*3/uL (ref 0.7–3.1)
MCH: 31.4 pg (ref 26.6–33.0)
MCHC: 33 g/dL (ref 31.5–35.7)
MCV: 95 fL (ref 79–97)
MONOCYTES: 10 %
Monocytes Absolute: 0.8 10*3/uL (ref 0.1–0.9)
NEUTROS PCT: 77 %
Neutrophils Absolute: 6.7 10*3/uL (ref 1.4–7.0)
Platelets: 231 10*3/uL (ref 150–379)
RBC: 4.56 x10E6/uL (ref 3.77–5.28)
RDW: 13.4 % (ref 12.3–15.4)
WBC: 8.7 10*3/uL (ref 3.4–10.8)

## 2017-10-21 LAB — BASIC METABOLIC PANEL
BUN / CREAT RATIO: 17 (ref 12–28)
BUN: 18 mg/dL (ref 8–27)
CO2: 25 mmol/L (ref 20–29)
CREATININE: 1.04 mg/dL — AB (ref 0.57–1.00)
Calcium: 9.6 mg/dL (ref 8.7–10.3)
Chloride: 102 mmol/L (ref 96–106)
GFR, EST AFRICAN AMERICAN: 59 mL/min/{1.73_m2} — AB (ref >59)
GFR, EST NON AFRICAN AMERICAN: 51 mL/min/{1.73_m2} — AB (ref >59)
Glucose: 88 mg/dL (ref 65–99)
Potassium: 4 mmol/L (ref 3.5–5.2)
Sodium: 131 mmol/L — ABNORMAL LOW (ref 134–144)

## 2017-10-22 NOTE — Progress Notes (Signed)
Pt husband, [place], [place] on file, has been made aware of pts lab results and he verbalized understanding.  jw Mailed copy per husband.

## 2017-11-24 ENCOUNTER — Ambulatory Visit (INDEPENDENT_AMBULATORY_CARE_PROVIDER_SITE_OTHER): Payer: Medicare Other | Admitting: *Deleted

## 2017-11-24 DIAGNOSIS — I442 Atrioventricular block, complete: Secondary | ICD-10-CM

## 2017-11-24 NOTE — Progress Notes (Signed)
Remote pacemaker transmission.   

## 2017-11-25 ENCOUNTER — Encounter (HOSPITAL_COMMUNITY): Payer: Self-pay | Admitting: Orthopaedic Surgery

## 2018-01-13 LAB — CUP PACEART REMOTE DEVICE CHECK
Brady Statistic AS VP Percent: 27 %
Brady Statistic RA Percent Paced: 23 %
Brady Statistic RV Percent Paced: 47 %
Date Time Interrogation Session: 20190605072445
Implantable Lead Implant Date: 20140613
Implantable Lead Implant Date: 20140613
Implantable Lead Location: 753859
Implantable Lead Location: 753860
Lead Channel Impedance Value: 310 Ohm
Lead Channel Impedance Value: 330 Ohm
Lead Channel Pacing Threshold Amplitude: 0.5 V
Lead Channel Pacing Threshold Amplitude: 1 V
Lead Channel Pacing Threshold Pulse Width: 0.5 ms
Lead Channel Sensing Intrinsic Amplitude: 12 mV
Lead Channel Setting Pacing Amplitude: 2 V
Lead Channel Setting Pacing Amplitude: 2.5 V
Lead Channel Setting Pacing Pulse Width: 0.5 ms
MDC IDC MSMT BATTERY REMAINING LONGEVITY: 106 mo
MDC IDC MSMT BATTERY REMAINING PERCENTAGE: 95.5 %
MDC IDC MSMT BATTERY VOLTAGE: 2.98 V
MDC IDC MSMT LEADCHNL RA SENSING INTR AMPL: 3.4 mV
MDC IDC MSMT LEADCHNL RV PACING THRESHOLD PULSEWIDTH: 0.5 ms
MDC IDC PG IMPLANT DT: 20140613
MDC IDC SET LEADCHNL RV SENSING SENSITIVITY: 2 mV
MDC IDC STAT BRADY AP VP PERCENT: 20 %
MDC IDC STAT BRADY AP VS PERCENT: 3.8 %
MDC IDC STAT BRADY AS VS PERCENT: 47 %
Pulse Gen Model: 2210
Pulse Gen Serial Number: 7474456

## 2018-01-14 ENCOUNTER — Telehealth: Payer: Self-pay | Admitting: Internal Medicine

## 2018-01-14 MED ORDER — BENZONATATE 100 MG PO CAPS: 100.0000 mg | ORAL_CAPSULE | Freq: Two times a day (BID) | ORAL | 1 refills | Status: DC | PRN

## 2018-01-14 NOTE — Telephone Encounter (Signed)
Called in [place] as requested

## 2018-01-14 NOTE — Telephone Encounter (Signed)
Caregiver is aware and will let [name] know that a Rx has been sent.

## 2018-01-14 NOTE — Telephone Encounter (Signed)
Patient's husband, [name] is calling and wants to know if you will call in [name] for [facility].  States that she is coughing.  No fever, non-productive cough.  She doesn't need to be seen he says.  They had some Tessalon Perles and they only have 1 left that you had prescribed to them before.  They help her and he just wanted to know if you would send her some to the pharmacy?    Pharmacy:  [facility]  Thank you.

## 2018-01-14 NOTE — Telephone Encounter (Signed)
Please call him. Have e-scribed this Rx

## 2018-02-18 ENCOUNTER — Ambulatory Visit: Payer: Medicare Other | Admitting: Internal Medicine

## 2018-02-18 ENCOUNTER — Encounter: Payer: Self-pay | Admitting: Internal Medicine

## 2018-02-18 VITALS — BP 110/70 | HR 74 | Temp 98.3°F | Ht 66.0 in | Wt 135.0 lb

## 2018-02-18 DIAGNOSIS — H9201 Otalgia, right ear: Secondary | ICD-10-CM | POA: Diagnosis not present

## 2018-02-18 DIAGNOSIS — R413 Other amnesia: Secondary | ICD-10-CM | POA: Diagnosis not present

## 2018-02-18 DIAGNOSIS — H6501 Acute serous otitis media, right ear: Secondary | ICD-10-CM | POA: Diagnosis not present

## 2018-02-18 MED ORDER — METHYLPREDNISOLONE ACETATE 80 MG/ML IJ SUSP
80.0000 mg | Freq: Once | INTRAMUSCULAR | Status: AC
  Administered 2018-02-18: 80 mg via INTRAMUSCULAR

## 2018-02-18 MED ORDER — AZITHROMYCIN 250 MG PO TABS: ORAL_TABLET | ORAL | 0 refills | Status: DC

## 2018-02-18 NOTE — Patient Instructions (Signed)
Zithromax Z-pak 2 po day 1 followed by one po days 2-5. Depomedrol 80 mgIM

## 2018-02-18 NOTE — Progress Notes (Signed)
   Subjective:    Patient ID: [name], female    DOB: 10-31-1934, 83 y.o.   MRN: [id number]  HPI 83 year old Female with history of memory loss presents today with complaint of right ear pain and jaw pain onset this morning.  She is accompanied by her husband.  No complaint of runny nose or sore throat.  No cough or congestion.    Review of Systems see above     Objective:   Physical Exam She appears to have a mild fullness in her right TM.  Pharynx is clear.  Left TM is clear.  Neck is supple without adenopathy.  Chest clear to auscultation.       Assessment & Plan:  Acute right serous otitis media  Plan: Depo-Medrol 80 mg IM which should help with pain and decongest her ear.  Zithromax Z-PAK take 2 tablets day 1 followed by 1 tablet days 2 through 5.

## 2018-02-23 ENCOUNTER — Ambulatory Visit (INDEPENDENT_AMBULATORY_CARE_PROVIDER_SITE_OTHER): Payer: Medicare Other | Admitting: *Deleted

## 2018-02-23 DIAGNOSIS — I442 Atrioventricular block, complete: Secondary | ICD-10-CM | POA: Diagnosis not present

## 2018-02-23 DIAGNOSIS — I480 Paroxysmal atrial fibrillation: Secondary | ICD-10-CM

## 2018-02-23 NOTE — Progress Notes (Signed)
Remote pacemaker transmission.   

## 2018-02-24 ENCOUNTER — Telehealth: Payer: Self-pay | Admitting: Internal Medicine

## 2018-02-24 NOTE — Telephone Encounter (Signed)
Husband called to say patient's right ear is still hurting.  Advised that she has some phlegm coming out of her nose.  No cough/congestion, no fever.  Advised she finished the Z-Pak on Tuesday.  I advised patient that it stays in the body for 10 days.  She can apply warm compresses for 10-15 minutes every 2-3 hours.  She can also take Tylenol for the pain.  She is due to see Dr. Renold Genta on Tuesday, 9/10 for her CPE.  He will call back if she gets worse; starts running a fever, or cannot tolerate the pain until next week since the Z-Pak is still remaining in her body for another 5 days covering her.    Husband verbalized an understanding of our conversation.

## 2018-02-28 ENCOUNTER — Other Ambulatory Visit: Payer: Medicare Other | Admitting: Internal Medicine

## 2018-02-28 DIAGNOSIS — E785 Hyperlipidemia, unspecified: Secondary | ICD-10-CM

## 2018-02-28 DIAGNOSIS — Z0000 Encounter for general adult medical examination without abnormal findings: Secondary | ICD-10-CM

## 2018-02-28 DIAGNOSIS — Z8673 Personal history of transient ischemic attack (TIA), and cerebral infarction without residual deficits: Secondary | ICD-10-CM

## 2018-02-28 DIAGNOSIS — Z8679 Personal history of other diseases of the circulatory system: Secondary | ICD-10-CM

## 2018-02-28 DIAGNOSIS — Z7901 Long term (current) use of anticoagulants: Secondary | ICD-10-CM

## 2018-02-28 DIAGNOSIS — R413 Other amnesia: Secondary | ICD-10-CM

## 2018-02-28 DIAGNOSIS — Z8781 Personal history of (healed) traumatic fracture: Secondary | ICD-10-CM

## 2018-02-28 LAB — COMPLETE METABOLIC PANEL WITH GFR
AG Ratio: 1.7 (calc) (ref 1.0–2.5)
ALBUMIN MSPROF: 4 g/dL (ref 3.6–5.1)
ALT: 13 U/L (ref 6–29)
AST: 24 U/L (ref 10–35)
Alkaline phosphatase (APISO): 51 U/L (ref 33–130)
BUN / CREAT RATIO: 19 (calc) (ref 6–22)
BUN: 21 mg/dL (ref 7–25)
CALCIUM: 9.4 mg/dL (ref 8.6–10.4)
CO2: 28 mmol/L (ref 20–32)
CREATININE: 1.1 mg/dL — AB (ref 0.60–0.88)
Chloride: 105 mmol/L (ref 98–110)
GFR, EST AFRICAN AMERICAN: 55 mL/min/{1.73_m2} — AB (ref >=60)
GFR, Est Non African American: 47 mL/min/{1.73_m2} — ABNORMAL LOW (ref >=60)
Globulin: 2.4 g/dL (calc) (ref 1.9–3.7)
Glucose, Bld: 76 mg/dL (ref 65–99)
Potassium: 3.9 mmol/L (ref 3.5–5.3)
Sodium: 141 mmol/L (ref 135–146)
TOTAL PROTEIN: 6.4 g/dL (ref 6.1–8.1)
Total Bilirubin: 0.6 mg/dL (ref 0.2–1.2)

## 2018-02-28 LAB — LIPID PANEL
CHOL/HDL RATIO: 3.1 (calc) (ref <5.0)
Cholesterol: 232 mg/dL — ABNORMAL HIGH (ref <200)
HDL: 76 mg/dL (ref >50)
LDL CHOLESTEROL (CALC): 138 mg/dL — AB
NON-HDL CHOLESTEROL (CALC): 156 mg/dL — AB (ref <130)
Triglycerides: 79 mg/dL (ref <150)

## 2018-02-28 LAB — CBC WITH DIFFERENTIAL/PLATELET
BASOS ABS: 41 {cells}/uL (ref 0–200)
Basophils Relative: 0.7 %
Eosinophils Absolute: 118 cells/uL (ref 15–500)
Eosinophils Relative: 2 %
HEMATOCRIT: 40.4 % (ref 35.0–45.0)
HEMOGLOBIN: 13.9 g/dL (ref 11.7–15.5)
LYMPHS ABS: 1109 {cells}/uL (ref 850–3900)
MCH: 31.7 pg (ref 27.0–33.0)
MCHC: 34.4 g/dL (ref 32.0–36.0)
MCV: 92.2 fL (ref 80.0–100.0)
MPV: 9 fL (ref 7.5–12.5)
Monocytes Relative: 8.5 %
NEUTROS ABS: 4130 {cells}/uL (ref 1500–7800)
NEUTROS PCT: 70 %
Platelets: 213 10*3/uL (ref 140–400)
RBC: 4.38 10*6/uL (ref 3.80–5.10)
RDW: 12.5 % (ref 11.0–15.0)
Total Lymphocyte: 18.8 %
WBC: 5.9 10*3/uL (ref 3.8–10.8)
WBCMIX: 502 {cells}/uL (ref 200–950)

## 2018-02-28 LAB — TSH: TSH: 2.98 mIU/L (ref 0.40–4.50)

## 2018-03-07 ENCOUNTER — Other Ambulatory Visit: Payer: Medicare Other | Admitting: Internal Medicine

## 2018-03-08 ENCOUNTER — Ambulatory Visit (INDEPENDENT_AMBULATORY_CARE_PROVIDER_SITE_OTHER): Payer: Medicare Other | Admitting: Internal Medicine

## 2018-03-08 ENCOUNTER — Encounter: Payer: Self-pay | Admitting: Internal Medicine

## 2018-03-08 VITALS — BP 106/70 | HR 64 | Ht 66.5 in | Wt 134.0 lb

## 2018-03-08 DIAGNOSIS — R413 Other amnesia: Secondary | ICD-10-CM

## 2018-03-08 DIAGNOSIS — Z7901 Long term (current) use of anticoagulants: Secondary | ICD-10-CM | POA: Diagnosis not present

## 2018-03-08 DIAGNOSIS — Z8781 Personal history of (healed) traumatic fracture: Secondary | ICD-10-CM | POA: Diagnosis not present

## 2018-03-08 DIAGNOSIS — E039 Hypothyroidism, unspecified: Secondary | ICD-10-CM

## 2018-03-08 DIAGNOSIS — R634 Abnormal weight loss: Secondary | ICD-10-CM

## 2018-03-08 DIAGNOSIS — Z8679 Personal history of other diseases of the circulatory system: Secondary | ICD-10-CM | POA: Diagnosis not present

## 2018-03-08 DIAGNOSIS — Z950 Presence of cardiac pacemaker: Secondary | ICD-10-CM

## 2018-03-08 DIAGNOSIS — Z0000 Encounter for general adult medical examination without abnormal findings: Secondary | ICD-10-CM | POA: Diagnosis not present

## 2018-03-08 LAB — POCT URINALYSIS DIPSTICK
APPEARANCE: NORMAL
Bilirubin, UA: NEGATIVE
Glucose, UA: NEGATIVE
Ketones, UA: NEGATIVE
LEUKOCYTES UA: NEGATIVE
NITRITE UA: NEGATIVE
ODOR: NORMAL
PROTEIN UA: NEGATIVE
SPEC GRAV UA: 1.015 (ref 1.010–1.025)
Urobilinogen, UA: 0.2 E.U./dL
pH, UA: 6 (ref 5.0–8.0)

## 2018-03-08 NOTE — Progress Notes (Signed)
Subjective:    Patient ID: [name], female    DOB: February 11, 1935, 83 y.o.   MRN: [id number]  HPI 83 year old Female for Medicare wellness, healthcare maintenance, and evaluation of medical issues.  She is accompanied by her female caretaker and her husband.  Patient has memory loss and is on Namzaric.  History of chronic anticoagulation for history of paroxysmal atrial fibrillation.  She has a pacemaker.  Is followed by Drs. Lovena Le and [employer].  History of right hip fracture 2016 although she does not recall fall or injury.  Required right hip arthroplasty for acute right transverse femoral neck fracture in 2016.  Was subsequently sent to [facility] she is taking Fosamax.  Weight in 2015 was 135 pounds.  Last year was noted to be 150 pounds in August but at 143 pounds in January.  Now weighing 134 pounds which is what she weighed in 2015.  Appetite is fair.  Weight 136 pounds and heart care in May.  Weight 138 pounds at neurologist in April.  Followed by Dr. Jannifer Franklin.  Dr. Kathrin Penner is her ophthalmologist.    Mild records indicate she has a history of right ovarian cystectomy and appendectomy 1965 for endometriosis.  Had a tumor removed from abdomen in 1985 and polyp removed from cervical loss 1996.  Apparently there was a question of a right pelvic mass in 2014 when seen by GYN and was complaining of some right pelvic pain.  However ultrasound did not show presence of right ovary nor a mass.  Long-standing history of urinary frequency nocturia.  She had colonoscopy May 2013 by Dr. Carlean Purl showing a hyperplastic polyp.  In 2007 she had colonoscopy Dr. Collene Mares and had 2 tubular adenomas removed.  History of borderline hypothyroidism and has seen Dr. Chalmers Cater in the past but currently not on thyroid replacement.  Husband brought her memory issues to my attention in 2014.  History of dizziness and left-sided weakness in 2011 and was thought to possibly have TIA.  MRI showed  chronic microvascular ischemia.  She nor her husband want her to have a mammogram, nor do not want to colon cancer screening.  Gets annual flu vaccine.  In 1996 had a polyp removed from cervical os.  She is a retired Chief Technology Officer.  Husband is a retired Teaching laboratory technician.  Patient taught second and third grade for 30 years.  Rare alcohol consumption.  Non-smoker.  3 children.  One son and one daughter are adopted.  They have 1 daughter who is her natural child.  Family history: Father died from colon cancer at age 18.  Mother died from colon blockage after surgery at age 43.  Brother with history of coronary artery disease and stent placement.  1997, patient was diagnosed by Dr. Lajuana Ripple for depression and was on Paxil for about a year.  In 2001 she had reconstruction of left thumb CMC joint by Dr. Daylene Katayama      Review of Systems  Constitutional:       Has had  weight loss of the past few months  Respiratory: Negative.   Cardiovascular: Negative.   Gastrointestinal: Negative.   Genitourinary: Negative.    no new complaints     Objective:   Physical Exam  Constitutional: She is oriented to person, place, and time. She appears well-developed and well-nourished.  HENT:  Head: Normocephalic and atraumatic.  Right Ear: External ear normal.  Left Ear: External ear normal.  Mouth/Throat: Oropharynx is clear and moist.  Eyes:  Pupils are equal, round, and reactive to light. Conjunctivae and EOM are normal. Right eye exhibits no discharge. Left eye exhibits no discharge. No scleral icterus.  Neck: Neck supple. No JVD present. No thyromegaly present.  Cardiovascular: Normal rate, regular rhythm, normal heart sounds and intact distal pulses.  No murmur heard. Pulmonary/Chest: Effort normal and breath sounds normal. No respiratory distress. She has no wheezes. She has no rales.  Abdominal: Soft. Bowel sounds are normal. She exhibits no distension and no mass. There is  no tenderness. There is no guarding.  Genitourinary:  Genitourinary Comments: Deferred  Musculoskeletal: She exhibits no edema.  Lymphadenopathy:    She has no cervical adenopathy.  Neurological: She is alert and oriented to person, place, and time. She displays normal reflexes. No cranial nerve deficit or sensory deficit. She exhibits normal muscle tone. Coordination normal.  Skin: Skin is dry.  Psychiatric: She has a normal mood and affect. Her behavior is normal.  Vitals reviewed.         Assessment & Plan:  Weight loss over the past several months-needs follow-up in 3 months.  Caretaker reports appetite is about the same.  Currently weighing the same as she did in 2015.  Did weigh up to 150 pounds at one time but apparently has lost about 15 pounds.  She does not look cachectic.  History of pacemaker  History of paroxysmal atrial fibrillation on chronic anticoagulation  History of right hip fracture 2016  History of hyperlipidemia  Hypothyroidism-TSH stable on current dose of thyroid replacement  Memory loss followed by neurology and treated with Namzaric .  Gets anxious at times.  Have prescribed lorazepam and husband and caretakers request to take sparingly twice daily as needed  History of osteopenia treated with Fosamax  Plan: Continue same medications and follow-up with weight loss issues in 3 months  Subjective:   Patient presents for Medicare Annual/Subsequent preventive examination.  Review Past Medical/Family/Social: See above   Risk Factors  Current exercise habits: Mostly sedentary but walks some Dietary issues discussed: Low-fat low carbohydrate  Cardiac risk factors: Brother with history of coronary artery disease  Depression Screen  (Note: if answer to either of the following is "Yes", a more complete depression screening is indicated)   Over the past two weeks, have you felt down, depressed or hopeless?  Patient cannot answer these questions  because of memory issues Over the past two weeks, have you felt little interest or pleasure in doing things?  Cannot answer reliably Have you lost interest or pleasure in daily life?  Is pleasant and cooperative.  Cannot answer reliably Do you often feel hopeless?  Cannot answer due to memory issues Do you cry easily over simple problems? No   Activities of Daily Living  In your present state of health, do you have any difficulty performing the following activities?:   Driving?  Does not drive or manage money  Feeding yourself? No  Getting from bed to chair? No  Climbing a flight of stairs? -Does not go upstairs anymore Preparing food and eating?:  Eats out for lunch every day with husband.  Does not cook. Bathing or showering?  With assistance Getting dressed: With assistance Getting to the toilet? No  Using the toilet:No  Moving around from place to place: No  In the past year have you fallen or had a near fall?:No  Are you sexually active? No  Do you have more than one partner? No   Hearing Difficulties: No  Do  you often ask people to speak up or repeat themselves?  Yes Do you experience ringing or noises in your ears?  Not ask Do you have difficulty understanding soft or whispered voices?  Not a is due to memory issues Do you feel that you have a problem with memory? No Do you often misplace items?  Yes   Home Safety:  Do you have a smoke alarm at your residence? Yes Do you have grab bars in the bathroom?  Yes Do you have throw rugs in your house?  Yes   Cognitive Testing  Alert? Yes Normal Appearance?Yes  Oriented to person? Yes Place? no Time?  No Recall of three objects?  Not tested Can perform simple calculations?  Not tested Displays appropriate judgment?Yes  Can read the correct time from a watch face? Not tested  List the Names of Other Physician/Practitioners you currently use:  See referral list for the physicians patient is currently seeing.      Review of Systems: See above  Objective:     General appearance: Appears stated age and is cooperative Head: Normocephalic, without obvious abnormality, atraumatic  Eyes: conj clear, EOMi PEERLA  Ears: normal TM's and external ear canals both ears  Nose: Nares normal. Septum midline. Mucosa normal. No drainage or sinus tenderness.  Throat: lips, mucosa, and tongue normal; teeth and gums normal  Neck: no adenopathy, no carotid bruit, no JVD, supple, symmetrical, trachea midline and thyroid not enlarged, symmetric, no tenderness/mass/nodules  No CVA tenderness.  Lungs: clear to auscultation bilaterally  Breasts: normal appearance, no masses or tenderness Heart: regular rate and rhythm, S1, S2 normal, no murmur, click, rub or gallop  Abdomen: soft, non-tender; bowel sounds normal; no masses, no organomegaly  Musculoskeletal: ROM normal in all joints, no crepitus, no deformity, Normal muscle strengthen. Back  is symmetric, no curvature. Skin: Skin color, texture, turgor normal. No rashes or lesions  Lymph nodes: Cervical, supraclavicular, and axillary nodes normal.  Neurologic: CN 2 -12 Normal, Normal symmetric reflexes. Normal coordination and gait  Psych: Alert & Oriented x 3, Mood appear stable.    Assessment:    Annual wellness medicare exam   Plan:    During the course of the visit the patient was educated and counseled about appropriate screening and preventive services including:   Annual flu vaccine  Declines mammogram     Patient Instructions (the written plan) was given to the patient.  Medicare Attestation  I have personally reviewed:  The patient's medical and social history  Their use of alcohol, tobacco or illicit drugs  Their current medications and supplements  The patient's functional ability including ADLs,fall risks, home safety risks, cognitive, and hearing and visual impairment  Diet and physical activities  Evidence for depression or mood  disorders  The patient's weight, height, BMI, and visual acuity have been recorded in the chart. I have made referrals, counseling, and provided education to the patient based on review of the above and I have provided the patient with a written personalized care plan for preventive services.

## 2018-03-08 NOTE — Patient Instructions (Signed)
RTC 3 months for weight check. Mammogram declined.

## 2018-03-14 ENCOUNTER — Other Ambulatory Visit: Payer: Self-pay | Admitting: Internal Medicine

## 2018-03-17 ENCOUNTER — Other Ambulatory Visit: Payer: Self-pay | Admitting: Cardiovascular Disease

## 2018-03-17 NOTE — Telephone Encounter (Signed)
Pt last saw Dr Lovena Le 07/13/17, last labs 02/28/18 Creat 1.10, age 83, weight 60.8kg, based on specified criteria pt is on appropriate dosage of Eliquis 5mg  BID.  Will refill rx.

## 2018-03-22 LAB — CUP PACEART REMOTE DEVICE CHECK
Battery Voltage: 2.98 V
Brady Statistic AS VS Percent: 49 %
Date Time Interrogation Session: 20190904075958
Implantable Lead Implant Date: 20140613
Implantable Pulse Generator Implant Date: 20140613
Lead Channel Pacing Threshold Amplitude: 0.5 V
Lead Channel Pacing Threshold Amplitude: 1 V
Lead Channel Pacing Threshold Pulse Width: 0.5 ms
Lead Channel Sensing Intrinsic Amplitude: 4.5 mV
Lead Channel Setting Pacing Amplitude: 2 V
Lead Channel Setting Pacing Amplitude: 2.5 V
Lead Channel Setting Pacing Pulse Width: 0.5 ms
MDC IDC LEAD IMPLANT DT: 20140613
MDC IDC LEAD LOCATION: 753859
MDC IDC LEAD LOCATION: 753860
MDC IDC MSMT BATTERY REMAINING LONGEVITY: 107 mo
MDC IDC MSMT BATTERY REMAINING PERCENTAGE: 95.5 %
MDC IDC MSMT LEADCHNL RA IMPEDANCE VALUE: 340 Ohm
MDC IDC MSMT LEADCHNL RA PACING THRESHOLD PULSEWIDTH: 0.5 ms
MDC IDC MSMT LEADCHNL RV IMPEDANCE VALUE: 340 Ohm
MDC IDC MSMT LEADCHNL RV SENSING INTR AMPL: 12 mV
MDC IDC SET LEADCHNL RV SENSING SENSITIVITY: 2 mV
MDC IDC STAT BRADY AP VP PERCENT: 20 %
MDC IDC STAT BRADY AP VS PERCENT: 3.2 %
MDC IDC STAT BRADY AS VP PERCENT: 26 %
MDC IDC STAT BRADY RA PERCENT PACED: 22 %
MDC IDC STAT BRADY RV PERCENT PACED: 46 %
Pulse Gen Model: 2210
Pulse Gen Serial Number: 7474456

## 2018-03-30 ENCOUNTER — Emergency Department (HOSPITAL_COMMUNITY)
Admission: EM | Admit: 2018-03-30 | Discharge: 2018-03-30 | Disposition: A | Discharge status: Home / Self Care | Payer: Medicare Other | Attending: Emergency Medicine | Admitting: Emergency Medicine

## 2018-03-30 ENCOUNTER — Other Ambulatory Visit: Payer: Self-pay

## 2018-03-30 ENCOUNTER — Emergency Department (HOSPITAL_COMMUNITY): Payer: Medicare Other

## 2018-03-30 ENCOUNTER — Telehealth: Payer: Self-pay | Admitting: Internal Medicine

## 2018-03-30 ENCOUNTER — Encounter (HOSPITAL_COMMUNITY): Payer: Self-pay | Admitting: Obstetrics and Gynecology

## 2018-03-30 DIAGNOSIS — Z79899 Other long term (current) drug therapy: Secondary | ICD-10-CM | POA: Diagnosis not present

## 2018-03-30 DIAGNOSIS — J45909 Unspecified asthma, uncomplicated: Secondary | ICD-10-CM | POA: Diagnosis not present

## 2018-03-30 DIAGNOSIS — Z96641 Presence of right artificial hip joint: Secondary | ICD-10-CM | POA: Insufficient documentation

## 2018-03-30 DIAGNOSIS — F0390 Unspecified dementia without behavioral disturbance: Secondary | ICD-10-CM | POA: Insufficient documentation

## 2018-03-30 DIAGNOSIS — R339 Retention of urine, unspecified: Secondary | ICD-10-CM | POA: Diagnosis not present

## 2018-03-30 DIAGNOSIS — Z7901 Long term (current) use of anticoagulants: Secondary | ICD-10-CM | POA: Diagnosis not present

## 2018-03-30 DIAGNOSIS — R109 Unspecified abdominal pain: Secondary | ICD-10-CM

## 2018-03-30 DIAGNOSIS — E039 Hypothyroidism, unspecified: Secondary | ICD-10-CM | POA: Insufficient documentation

## 2018-03-30 DIAGNOSIS — Z950 Presence of cardiac pacemaker: Secondary | ICD-10-CM | POA: Insufficient documentation

## 2018-03-30 DIAGNOSIS — Z8673 Personal history of transient ischemic attack (TIA), and cerebral infarction without residual deficits: Secondary | ICD-10-CM | POA: Diagnosis not present

## 2018-03-30 HISTORY — DX: Unspecified atrial fibrillation: I48.91

## 2018-03-30 LAB — URINALYSIS, ROUTINE W REFLEX MICROSCOPIC
Bilirubin Urine: NEGATIVE
Glucose, UA: NEGATIVE mg/dL
KETONES UR: 5 mg/dL — AB
Leukocytes, UA: NEGATIVE
Nitrite: NEGATIVE
PROTEIN: NEGATIVE mg/dL
Specific Gravity, Urine: 1.018 (ref 1.005–1.030)
pH: 5 (ref 5.0–8.0)

## 2018-03-30 LAB — CBC
HCT: 43.3 % (ref 36.0–46.0)
HEMOGLOBIN: 14.4 g/dL (ref 12.0–15.0)
MCH: 31.6 pg (ref 26.0–34.0)
MCHC: 33.3 g/dL (ref 30.0–36.0)
MCV: 95.2 fL (ref 80.0–100.0)
Platelets: 207 10*3/uL (ref 150–400)
RBC: 4.55 MIL/uL (ref 3.87–5.11)
RDW: 12.4 % (ref 11.5–15.5)
WBC: 7 10*3/uL (ref 4.0–10.5)
nRBC: 0 % (ref 0.0–0.2)

## 2018-03-30 LAB — COMPREHENSIVE METABOLIC PANEL
ALBUMIN: 4.2 g/dL (ref 3.5–5.0)
ALK PHOS: 49 U/L (ref 38–126)
ALT: 18 U/L (ref 0–44)
AST: 24 U/L (ref 15–41)
Anion gap: 10 (ref 5–15)
BUN: 22 mg/dL (ref 8–23)
CO2: 29 mmol/L (ref 22–32)
Calcium: 9.8 mg/dL (ref 8.9–10.3)
Chloride: 102 mmol/L (ref 98–111)
Creatinine, Ser: 0.95 mg/dL (ref 0.44–1.00)
GFR calc Af Amer: >60 mL/min (ref >60)
GFR calc non Af Amer: 55 mL/min — ABNORMAL LOW (ref >60)
GLUCOSE: 108 mg/dL — AB (ref 70–99)
POTASSIUM: 3.8 mmol/L (ref 3.5–5.1)
SODIUM: 141 mmol/L (ref 135–145)
Total Bilirubin: 0.7 mg/dL (ref 0.3–1.2)
Total Protein: 7.1 g/dL (ref 6.5–8.1)

## 2018-03-30 LAB — POC OCCULT BLOOD, ED: Fecal Occult Bld: NEGATIVE

## 2018-03-30 LAB — LIPASE, BLOOD: Lipase: 49 U/L (ref 11–51)

## 2018-03-30 MED ORDER — IOPAMIDOL (ISOVUE-300) INJECTION 61%
100.0000 mL | Freq: Once | INTRAVENOUS | Status: AC | PRN
  Administered 2018-03-30: 100 mL via INTRAVENOUS

## 2018-03-30 MED ORDER — FENTANYL CITRATE (PF) 100 MCG/2ML IJ SOLN
50.0000 ug | Freq: Once | INTRAMUSCULAR | Status: AC
  Administered 2018-03-30: 50 ug via INTRAVENOUS
  Filled 2018-03-30: qty 2

## 2018-03-30 MED ORDER — SODIUM CHLORIDE 0.9 % IV SOLN
INTRAVENOUS | Status: DC
  Administered 2018-03-30: 14:00:00 via INTRAVENOUS

## 2018-03-30 MED ORDER — SODIUM CHLORIDE 0.9 % IJ SOLN
INTRAMUSCULAR | Status: AC
  Filled 2018-03-30: qty 50

## 2018-03-30 MED ORDER — IOPAMIDOL (ISOVUE-300) INJECTION 61%
INTRAVENOUS | Status: AC
  Filled 2018-03-30: qty 100

## 2018-03-30 MED ORDER — LORAZEPAM 2 MG/ML IJ SOLN
0.5000 mg | Freq: Once | INTRAMUSCULAR | Status: AC
  Administered 2018-03-30: 0.5 mg via INTRAVENOUS
  Filled 2018-03-30: qty 1

## 2018-03-30 MED ORDER — TRAMADOL-ACETAMINOPHEN 37.5-325 MG PO TABS: 1.0000 | ORAL_TABLET | Freq: Three times a day (TID) | ORAL | 0 refills | Status: DC | PRN

## 2018-03-30 MED ORDER — ONDANSETRON HCL 4 MG/2ML IJ SOLN
4.0000 mg | Freq: Once | INTRAMUSCULAR | Status: AC
  Administered 2018-03-30: 4 mg via INTRAVENOUS
  Filled 2018-03-30: qty 2

## 2018-03-30 NOTE — Telephone Encounter (Signed)
Caregiver called saying pt was having abdominal pain. Did not know if pt was constipated. Advised Mylanta 30 cc po. In just a few minutes, care giver was back on phone saying pain was worse and was in abdomen  and chest. Advised taking pt to ED.

## 2018-03-30 NOTE — Discharge Instructions (Signed)
The testing today is reassuring.  There is no sign of a serious problem.  It is okay to use her Ativan twice a day if needed, and we are prescribing a pain pill to use 3 times a day as needed.  Encourage her to drink plenty of fluids and try to eat 3 meals a day.

## 2018-03-30 NOTE — ED Notes (Signed)
Pt. Had 58ml of urine in the bladder. Nurse aware.

## 2018-03-30 NOTE — ED Triage Notes (Signed)
Per EMS: Pt is coming from home, she has a hx of dementia. Pt reports RLQ and LLQ pain. Pt's caregiver reported she had a BM this morning and pt has audible bowel sounds. Pt is tender in the lower quadrants with EMS.  Pt is alert to self only.
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
## 2018-03-30 NOTE — ED Provider Notes (Signed)
[facility] DEPT Provider Note   CSN: [id number] Arrival date & time: 03/30/18  1116     History   Chief Complaint Chief Complaint  Patient presents with  . Abdominal Pain    HPI [name] is a 83 y.o. female.  HPI   Elderly patient presenting for evaluation of abdominal pain.  She is followed closely by her PCP, last seen about 3 weeks ago and found to have stable chronic medical problems.  No changes were made to her treatment plan at that time.  She presents today for evaluation of pain after having a bowel movement.  Family members with her state that she was sitting on a commode this morning and had a very small bowel movement.  She also stated that she needed to urinate but could not do it.  The patient is unable to give history.  Her husband is here and states that he forgot to give her Ativan last night so he gave it to her this morning.  Additionally she is taking all of her other medications today as prescribed.  Level 5 caveat-dementia  Past Medical History:  Diagnosis Date  . A-fib ([place])   . Allergy   . Arthritis   . Asthma   . Colon polyps   . Complete heart block -intermittent    a. 11/2012 s/p SJM [facility] DR RF dc ppm.  . Depression   . Environmental allergies   . Hyperlipidemia   . Memory retention disorder 03/22/2013  . Osteoarthritis    hands  . Thyroid disease     Patient Active Problem List   Diagnosis Date Noted  . Alzheimer's disease ([place]) 03/19/2016  . Paroxysmal atrial fibrillation ([place]) 07/10/2015  . Atrial fibrillation ([place]) 04/29/2015  . Closed right hip fracture ([place]) 01/02/2015  . Weight loss, unintentional 02/19/2014  . Dementia arising in the senium and presenium ([place]) 01/30/2014  . 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 Memory loss 07/07/2013  . Memory retention disorder 03/22/2013  . Pacemaker 03/09/2013  . Complete heart block -intermittent   .  Syncope 11/18/2012  . Personal history of adenomatous colonic polyps 11/12/2011  . History of TIA (transient ischemic attack) 09/19/2011  . Hypothyroidism 01/13/2011  . Hyperlipidemia 01/13/2011  . First degree AV block 01/13/2011  . Gait disturbance 01/13/2011    Past Surgical History:  Procedure Laterality Date  . APPENDECTOMY  1965  . CERVICAL POLYPECTOMY  1996  . COLONOSCOPY  2007   adenomatous polyps  . EYE SURGERY    . HAND SURGERY    . HIP ARTHROPLASTY Right 01/03/2015   Procedure: ARTHROPLASTY BIPOLAR HIP (HEMIARTHROPLASTY);  Surgeon: [name], MD;  Location: [facility] ORS;  Service: Orthopedics;  Laterality: Right;  . INSERT / REPLACE / REMOVE PACEMAKER  12/02/2012    Dr Lovena Le  . KNEE SURGERY    . ovarian cystectomy  1965  . PERMANENT PACEMAKER INSERTION N/A 12/02/2012   Procedure: PERMANENT PACEMAKER INSERTION;  Surgeon: Evans Lance, MD;  Location: [facility] CATH LAB;  Service: Cardiovascular;  Laterality: N/A;  . SALPINGOOPHORECTOMY     right  . tumor removed  1985   abdomen     OB History   None      Home Medications    Prior to Admission medications   Medication Sig Start Date End Date Taking? Authorizing Provider  alendronate (FOSAMAX) 70 MG tablet Take 70 mg by mouth once a week. Take with a full  glass of water on an empty stomach.   Yes [provider]  Calcium Carbonate (CALCIUM-CARB 600 PO) Take 1 tablet by mouth daily.    Yes [provider]  cholecalciferol (VITAMIN D) 1000 units tablet Take 1,000 Units by mouth daily.   Yes [provider]  ELIQUIS 5 MG TABS tablet TAKE 1 TABLET(5 MG) BY MOUTH TWICE DAILY Patient taking differently: Take 5 mg by mouth 2 (two) times daily.  03/17/18  Yes Evans Lance, MD  fluticasone (FLONASE) 50 MCG/ACT nasal spray Place 2 sprays into both nostrils daily.   Yes [provider]  levothyroxine (SYNTHROID, LEVOTHROID) 75 MCG tablet Take 75 mcg by mouth daily before breakfast.  12/13/14   Yes [provider]  LORazepam (ATIVAN) 0.5 MG tablet TAKE 1 TABLET BY MOUTH TWICE DAILY AS NEEDED FOR ANXIETY Patient taking differently: Take 0.25-0.5 mg by mouth 2 (two) times daily. 1/2 tablet in the am and 1 tablet at bedtime 03/14/18  Yes Baxley, Cresenciano Lick, MD  Melatonin 5 MG TABS Take 5 mg by mouth at bedtime.   Yes [provider]  NAMZARIC 28-10 MG CP24 TAKE 1 CAPSULE BY MOUTH AT BEDTIME Patient taking differently: Take 1 capsule by mouth at bedtime.  09/20/17  Yes Ward Givens, NP  vitamin B-12 (CYANOCOBALAMIN) 500 MCG tablet Take 500 mcg by mouth daily.    Yes [provider]  traMADol-acetaminophen (ULTRACET) 37.5-325 MG tablet Take 1 tablet by mouth every 8 (eight) hours as needed for moderate pain. 03/30/18   Daleen Bo, MD    Family History Family History  Problem Relation Age of Onset  . Colon cancer Father   . Colon polyps Brother   . Diabetes Brother        maternal aunt  . Coronary artery disease Brother     Social History Social History   Tobacco Use  . Smoking status: Never Smoker  . Smokeless tobacco: Never Used  Substance Use Topics  . Alcohol use: Yes    Alcohol/week: 0.0 standard drinks    Comment: occas. which is seldom  . Drug use: No     Allergies   Sulfa antibiotics and Sulfasalazine   Review of Systems Review of Systems  Unable to perform ROS: Dementia     Physical Exam Updated Vital Signs BP 118/68   Pulse 60   Temp 98 F (36.7 C) (Oral)   Resp 17   SpO2 96%   Physical Exam  Constitutional: She appears well-developed.  Elderly, frail  HENT:  Head: Normocephalic and atraumatic.  Eyes: Pupils are equal, round, and reactive to light. Conjunctivae and EOM are normal.  Neck: Normal range of motion and phonation normal. Neck supple.  Cardiovascular: Normal rate.  Pulmonary/Chest: Effort normal. She exhibits no tenderness.  Abdominal: Soft. She exhibits no distension and no mass. There is tenderness  (Moderate bilateral lower quadrant tenderness.). There is guarding (Lower abdomen). There is no rebound.  Genitourinary:  Genitourinary Comments: Normal anus.  Small amount of brown stool in rectal vault.  No rectal mass, fecal impaction or visible blood.  Musculoskeletal: Normal range of motion.  Neurological: She is alert. She exhibits normal muscle tone.  Skin: Skin is warm and dry.  Psychiatric:  Anxious  Nursing note and vitals reviewed.    ED Treatments / Results  Labs (all labs ordered are listed, but only abnormal results are displayed) Labs Reviewed  COMPREHENSIVE METABOLIC PANEL - Abnormal; Notable for the following components:  Result Value   Glucose, Bld 108 (*)    GFR calc non Af Amer 55 (*)    All other components within normal limits  URINALYSIS, ROUTINE W REFLEX MICROSCOPIC - Abnormal; Notable for the following components:   Hgb urine dipstick SMALL (*)    Ketones, ur 5 (*)    Bacteria, UA RARE (*)    All other components within normal limits  LIPASE, BLOOD  CBC  POC OCCULT BLOOD, ED    EKG None  Radiology Ct Abdomen Pelvis W Contrast  Result Date: 03/30/2018 CLINICAL DATA:  Bilat upper quadrant abdominal pain, tenderness, audible bowel sounds, history asthma, dementia EXAM: CT ABDOMEN AND PELVIS WITH CONTRAST TECHNIQUE: Multidetector CT imaging of the abdomen and pelvis was performed using the standard protocol following bolus administration of intravenous contrast. Sagittal and coronal MPR images reconstructed from axial data set. CONTRAST:  157mL ISOVUE-300 IOPAMIDOL (ISOVUE-300) INJECTION 61% IV. No oral contrast. COMPARISON:  08/08/2014 FINDINGS: Lower chest: Bibasilar atelectasis. Pacemaker leads RIGHT atrium and RIGHT ventricle. Hepatobiliary: Unremarkable gallbladder and liver Pancreas: Normal appearance. Spleen: Normal appearance Adrenals/Urinary Tract: Adrenal glands normal appearance. Tiny nonobstructing calculus and small upper pole cyst at RIGHT  kidney. Kidneys and visualized ureters normal appearance. Distal ureters and bladder are subtotally obscured by beam hardening artifacts in pelvis. Stomach/Bowel: Appendix surgically absent by history. Duodenal diverticulum. Stomach and visualized bowel loops otherwise unremarkable. No bowel dilatation or bowel wall thickening. Vascular/Lymphatic: Atherosclerotic calcifications aorta and iliac arteries. Aorta normal caliber. No adenopathy. Reproductive: Uterus normal appearance.  Ovaries not visualized. Other: No free air or free fluid.  No hernia. Musculoskeletal: Beam hardening artifacts in pelvis from RIGHT hip prosthesis. Bones diffusely demineralized. Degenerative disc and facet disease changes of the lumbar spine. IMPRESSION: No acute intra-abdominal or intrapelvic abnormalities. Bibasilar atelectasis. Electronically Signed   By: Lavonia Dana M.D.   On: 03/30/2018 16:29    Procedures Procedures (including critical care time)  Medications Ordered in ED Medications  0.9 %  sodium chloride infusion ( Intravenous New Bag/Given 03/30/18 1355)  iopamidol (ISOVUE-300) 61 % injection (has no administration in time range)  sodium chloride 0.9 % injection (has no administration in time range)  fentaNYL (SUBLIMAZE) injection 50 mcg (50 mcg Intravenous Given 03/30/18 1312)  ondansetron (ZOFRAN) injection 4 mg (4 mg Intravenous Given 03/30/18 1312)  LORazepam (ATIVAN) injection 0.5 mg (0.5 mg Intravenous Given 03/30/18 1312)  iopamidol (ISOVUE-300) 61 % injection 100 mL (100 mLs Intravenous Contrast Given 03/30/18 1548)     Initial Impression / Assessment and Plan / ED Course  I have reviewed the triage vital signs and the nursing notes.  Pertinent labs & imaging results that were available during my care of the patient were reviewed by me and considered in my medical decision making (see chart for details).  Clinical Course as of Mar 31 1643  Wed Mar 30, 2018  1252 Bladder scan shows minimal urine in  bladder.  Will give pain medicine, benzodiazepine, and check CT scan abdomen pelvis.   [EW]  57 Family members state that the Ativan and fentanyl controls her pain but she occasionally has been grimacing due to persistent discomfort.  She is currently getting a CT scan to evaluate for acute intra-abdominal processes.   [EW]  1623 Normal  CBC [EW]  1623 Normal  Comprehensive metabolic panel(!) [EW]  2993 Normal  Urinalysis, Routine w reflex microscopic(!) [EW]    Clinical Course User Index [EW] Daleen Bo, MD     Patient Vitals for  the past 24 hrs:  BP Temp Temp src Pulse Resp SpO2  03/30/18 1543 118/68 - - 60 17 96 %  03/30/18 1530 - - - 60 13 94 %  03/30/18 1345 - - - 60 15 100 %  03/30/18 1246 122/89 - - 74 18 100 %  03/30/18 1135 (!) 153/82 98 F (36.7 C) Oral 61 18 99 %    4:20 PM Reevaluation with update and discussion. After initial assessment and treatment, an updated evaluation reveals resting comfortably at this time.  She is cooperative.  Family updated on progress, and current findings. Daleen Bo   Medical Decision Making: Nonspecific abdominal pain, apparently onset today, moderately severe causing agitation, and very concerning to family members.  Patient improved after treatment.  She was comfortable after scan returned and findings were again discussed with family members at 4:40 PM.  No indication for acute intra-abdominal pathology, or impending vascular collapse.  CRITICAL CARE-no Performed by: Daleen Bo   Nursing Notes Reviewed/ Care Coordinated Applicable Imaging Reviewed Interpretation of Laboratory Data incorporated into ED treatment  The patient appears reasonably screened and/or stabilized for discharge and I doubt any other medical condition or other [facility] requiring further screening, evaluation, or treatment in the ED at this time prior to discharge.  Plan: Home Medications-continue current medications, use Ativan twice a day as needed;  Home Treatments-gradually advance diet; return here if the recommended treatment, does not improve the symptoms; Recommended follow up-PCP, PRN        Final Clinical Impressions(s) / ED Diagnoses   Final diagnoses:  Abdominal pain, unspecified abdominal location    ED Discharge Orders         Ordered    traMADol-acetaminophen (ULTRACET) 37.5-325 MG tablet  Every 8 hours PRN     03/30/18 1643           Daleen Bo, MD 03/30/18 1644

## 2018-03-31 ENCOUNTER — Encounter: Payer: Self-pay | Admitting: Adult Health

## 2018-03-31 ENCOUNTER — Ambulatory Visit: Payer: Medicare Other | Admitting: Adult Health

## 2018-03-31 ENCOUNTER — Telehealth: Payer: Self-pay

## 2018-03-31 VITALS — BP 103/60 | HR 82 | Ht 66.5 in | Wt 133.6 lb

## 2018-03-31 DIAGNOSIS — F0280 Dementia in other diseases classified elsewhere without behavioral disturbance: Secondary | ICD-10-CM

## 2018-03-31 DIAGNOSIS — G309 Alzheimer's disease, unspecified: Secondary | ICD-10-CM | POA: Diagnosis not present

## 2018-03-31 NOTE — Progress Notes (Signed)
PATIENT: [name] DOB: [birth year]  REASON FOR VISIT: follow up HISTORY FROM: patient  HISTORY OF PRESENT ILLNESS: Today 03/31/18:  [name] is an 83 year old female with a history of Alzheimer's disease.  She returns today for follow-up.  She is currently on Namzaric.  She lives at home.  They do have a caregiver that comes in 5 days a week to help with ADLs.  The patient does not operate a motor vehicle.  Reports good appetite.  Her husband manages her medications.  He also keeps up with all the appointments.  Denies hallucinations.  No change in mood or behavior she went to the hospital yesterday for abdominal pain.  He reports that all of her scans and tests were unremarkable.  She returns today for evaluation.  HISTORY 09/20/17 [name] is an 83 year old female with a history of Alzheimer's disease.  She returns today for follow-up.  She remains on Namzaric and is tolerating it well.  She is here today with her husband.  She lives at home with her husband but has a caregiver that comes in regularly to help her.  She does require some assistance with ADLs such as picking out her clothes.  Her husband now manages all of her medications, finances and assist with meal preparation.  Patient denies any changes in appetite.  Denies any changes in mood or behavior.  Denies hallucinations or agitation.  Patient returns today for an evaluation.   REVIEW OF SYSTEMS: Out of a complete 14 system review of symptoms, the patient complains only of the following symptoms, and all other reviewed systems are negative.  See HPI  ALLERGIES: Allergies  Allergen Reactions  . Sulfa Antibiotics Diarrhea and Rash  . Sulfasalazine Diarrhea and Rash    HOME MEDICATIONS: Outpatient Medications Prior to Visit  Medication Sig Dispense Refill  . alendronate (FOSAMAX) 70 MG tablet Take 70 mg by mouth once a week. Take with a full glass of water on an empty stomach.    . Calcium Carbonate (CALCIUM-CARB  600 PO) Take 1 tablet by mouth daily.     . cholecalciferol (VITAMIN D) 1000 units tablet Take 1,000 Units by mouth daily.    Marland Kitchen ELIQUIS 5 MG TABS tablet TAKE 1 TABLET(5 MG) BY MOUTH TWICE DAILY (Patient taking differently: Take 5 mg by mouth 2 (two) times daily. ) 60 tablet 5  . fluticasone (FLONASE) 50 MCG/ACT nasal spray Place 2 sprays into both nostrils daily.    Marland Kitchen levothyroxine (SYNTHROID, LEVOTHROID) 75 MCG tablet Take 75 mcg by mouth daily before breakfast.     . LORazepam (ATIVAN) 0.5 MG tablet TAKE 1 TABLET BY MOUTH TWICE DAILY AS NEEDED FOR ANXIETY (Patient taking differently: Take 0.25-0.5 mg by mouth 2 (two) times daily. 1/2 tablet in the am and 1 tablet at bedtime) 60 tablet 5  . Melatonin 5 MG TABS Take 5 mg by mouth 3 times/day as needed-between meals & bedtime.     Marland Kitchen NAMZARIC 28-10 MG CP24 TAKE 1 CAPSULE BY MOUTH AT BEDTIME (Patient taking differently: Take 1 capsule by mouth at bedtime. ) 90 capsule 3  . traMADol-acetaminophen (ULTRACET) 37.5-325 MG tablet Take 1 tablet by mouth every 8 (eight) hours as needed for moderate pain. 20 tablet 0  . vitamin B-12 (CYANOCOBALAMIN) 500 MCG tablet Take 500 mcg by mouth daily.      No facility-administered medications prior to visit.     PAST MEDICAL HISTORY: Past Medical History:  Diagnosis Date  .  A-fib ([place])   . Allergy   . Arthritis   . Asthma   . Colon polyps   . Complete heart block -intermittent    a. 11/2012 s/p SJM Accent DR RF dc ppm.  . Depression   . Environmental allergies   . Hyperlipidemia   . Memory retention disorder 03/22/2013  . Osteoarthritis    hands  . Thyroid disease     PAST SURGICAL HISTORY: Past Surgical History:  Procedure Laterality Date  . APPENDECTOMY  1965  . CERVICAL POLYPECTOMY  1996  . COLONOSCOPY  2007   adenomatous polyps  . EYE SURGERY    . HAND SURGERY    . HIP ARTHROPLASTY Right 01/03/2015   Procedure: ARTHROPLASTY BIPOLAR HIP (HEMIARTHROPLASTY);  Surgeon: [name], MD;   Location: [facility];  Service: Orthopedics;  Laterality: Right;  . INSERT / REPLACE / REMOVE PACEMAKER  12/02/2012    Dr Lovena Le  . KNEE SURGERY    . ovarian cystectomy  1965  . PERMANENT PACEMAKER INSERTION N/A 12/02/2012   Procedure: PERMANENT PACEMAKER INSERTION;  Surgeon: Evans Lance, MD;  Location: [facility] CATH LAB;  Service: Cardiovascular;  Laterality: N/A;  . SALPINGOOPHORECTOMY     right  . tumor removed  1985   abdomen    FAMILY HISTORY: Family History  Problem Relation Age of Onset  . Colon cancer Father   . Colon polyps Brother   . Diabetes Brother        maternal aunt  . Coronary artery disease Brother     SOCIAL HISTORY: Social History   Socioeconomic History  . Marital status: Married    Spouse name: [name]  . Number of children: 3  . Years of education: College  . Highest education level: Not on file  Occupational History  . 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 Financial resource strain: Not on file  . Food insecurity:    Worry: Not on file    Inability: Not on file  . Transportation needs:    Medical: Not on file    Non-medical: Not on file  Tobacco Use  . Smoking status: Never Smoker  . Smokeless tobacco: Never Used  Substance and Sexual Activity  . Alcohol use: Yes    Alcohol/week: 0.0 standard drinks    Comment: occas. which is seldom  . Drug use: No  . Sexual activity: Not Currently  Lifestyle  . Physical activity:    Days per week: Not on file    Minutes per session: Not on file  . Stress: Not on file  Relationships  . Social connections:    Talks on phone: Not on file    Gets together: Not on file    Attends religious service: Not on file    Active member of club or organization: Not on file    Attends meetings of clubs or organizations: Not on file    Relationship status: Not on file  . Intimate partner violence:    Fear of current or ex partner: Not on file    Emotionally abused: Not on file     Physically abused: Not on file    Forced sexual activity: Not on file  Other Topics Concern  . Not on file  Social History Narrative   Patient has one daughter, two adopted children(son,daughter).   Patient lives at home with spouse [name]).   Patient is retired.   Patient has a college education.   Patient is  right-handed.   Patient drinks very little caffeine.      PHYSICAL EXAM  Vitals:   03/31/18 1410  BP: 103/60  Pulse: 82  Weight: 133 lb 9.6 oz (60.6 kg)  Height: 5' 6.5" (1.689 m)   Body mass index is 21.24 kg/m.   MMSE - Mini Mental State Exam 09/20/2017 03/22/2017 09/16/2016  Orientation to time 1 0 2  Orientation to Place 0 1 2  Registration 3 3 3   Attention/ Calculation 5 0 1  Recall 0 0 0  Language- name 2 objects 2 2 2   Language- repeat 0 0 1  Language- follow 3 step command 3 3 3   Language- read & follow direction 1 1 1   Write a sentence 1 1 1   Copy design 1 1 1   Total score 17 12 17      Generalized: Well developed, in no acute distress   Neurological examination  Mentation: Alert. Follows all commands speech and language fluent Cranial nerve II-XII: Pupils were equal round reactive to light. Extraocular movements were full, visual field were full on confrontational test. Facial sensation and strength were normal. Uvula tongue midline. Head turning and shoulder shrug  were normal and symmetric. Motor: The motor testing reveals 5 over 5 strength of all 4 extremities. Good symmetric motor tone is noted throughout.  Sensory: Sensory testing is intact to soft touch on all 4 extremities. No evidence of extinction is noted.  Coordination: Cerebellar testing reveals good finger-nose-finger and heel-to-shin bilaterally.  Gait and station: Gait is normal.  Reflexes: Deep tendon reflexes are symmetric and normal bilaterally.   DIAGNOSTIC DATA (LABS, IMAGING, TESTING) - I reviewed patient records, labs, notes, testing and imaging myself where available.  Lab  Results  Component Value Date   WBC 7.0 03/30/2018   HGB 14.4 03/30/2018   HCT 43.3 03/30/2018   MCV 95.2 03/30/2018   PLT 207 03/30/2018      Component Value Date/Time   NA 141 03/30/2018 1125   NA 131 (L) 10/21/2017 1014   K 3.8 03/30/2018 1125   CL 102 03/30/2018 1125   CO2 29 03/30/2018 1125   GLUCOSE 108 (H) 03/30/2018 1125   BUN 22 03/30/2018 1125   BUN 18 10/21/2017 1014   CREATININE 0.95 03/30/2018 1125   CREATININE 1.10 (H) 02/28/2018 1108   CALCIUM 9.8 03/30/2018 1125   PROT 7.1 03/30/2018 1125   ALBUMIN 4.2 03/30/2018 1125   AST 24 03/30/2018 1125   ALT 18 03/30/2018 1125   ALKPHOS 49 03/30/2018 1125   BILITOT 0.7 03/30/2018 1125   GFRNONAA 55 (L) 03/30/2018 1125   GFRNONAA 47 (L) 02/28/2018 1108   GFRAA >60 03/30/2018 1125   GFRAA 55 (L) 02/28/2018 1108   Lab Results  Component Value Date   CHOL 232 (H) 02/28/2018   HDL 76 02/28/2018   LDLCALC 138 (H) 02/28/2018   LDLDIRECT 127.6 04/21/2013   TRIG 79 02/28/2018   CHOLHDL 3.1 02/28/2018   Lab Results  Component Value Date   HGBA1C 5.9 (H) 03/05/2014   Lab Results  Component Value Date   VITAMINB12 1,048 07/13/2017   Lab Results  Component Value Date   TSH 2.98 02/28/2018      ASSESSMENT AND PLAN 83 y.o. year old female  has a past medical history of A-fib ([place]), Allergy, Arthritis, Asthma, Colon polyps, Complete heart block -intermittent, Depression, Environmental allergies, Hyperlipidemia, Memory retention disorder (03/22/2013), Osteoarthritis, and Thyroid disease. here with:  1.  Memory disturbance  The patient's memory score  has remained stable.  She will continue on Namzaric.  If her symptoms worsen or she develops new symptoms she should let us know.  She will follow-up in 6 months or sooner if needed.  I spent 15 minutes with the patient. 50% of this time was spent reviewing memory score   Ward Givens, MSN, NP-C 03/31/2018, 2:18 PM [facility] [address]  [place], [place] [address] [phone]

## 2018-03-31 NOTE — Telephone Encounter (Signed)
Called patient's husband to check on patient he said she is doing great she has not complained of abd pain at all today and she looks well.

## 2018-03-31 NOTE — Patient Instructions (Signed)
Your Plan:  Continue Namzaric If your symptoms worsen or you develop new symptoms please let us know.    Thank you for coming to see us at [facility]. I hope we have been able to provide you high quality care today.  You may receive a patient satisfaction survey over the next few weeks. We would appreciate your feedback and comments so that we may continue to improve ourselves and the health of our patients.  

## 2018-03-31 NOTE — Progress Notes (Signed)
I have read the note, and I agree with the clinical assessment and plan.  Zakariah Dejarnette K Felishia Wartman   

## 2018-04-20 ENCOUNTER — Ambulatory Visit: Payer: Medicare Other | Admitting: Cardiovascular Disease

## 2018-04-20 ENCOUNTER — Encounter: Payer: Self-pay | Admitting: Cardiovascular Disease

## 2018-04-20 VITALS — BP 104/60 | HR 65 | Ht 66.5 in | Wt 130.0 lb

## 2018-04-20 DIAGNOSIS — I4819 Other persistent atrial fibrillation: Secondary | ICD-10-CM

## 2018-04-20 MED ORDER — APIXABAN 2.5 MG PO TABS: 2.5000 mg | ORAL_TABLET | Freq: Two times a day (BID) | ORAL | 11 refills | Status: DC

## 2018-04-20 NOTE — Progress Notes (Signed)
Cardiology Office Note   Date:  04/20/2018   ID:  [name], DOB [birth year], MRN [id number]  PCP:  Elby Showers, MD  Cardiologist:  Previous Darlin Coco MD , now Farmer   Chief Complaint  Patient presents with  . Atrial Fibrillation   Problem List 1. Paroxysmal atrial fibrillation 2. Hyperlipidemia 3. 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 Hypothyroidism-      [name] is a 83 y.o. female who presents for a six-month follow-up visit  This 83 year old woman is seen for a scheduled followup office visit. She has a history of intermittent high-grade AV block and has a functioning St. Jude dual-chamber pacemaker. She is followed for this by Dr. Lovena Le. She has a history of hypercholesterolemia. She has a history of osteoarthritis. She has also had some memory issues and is followed by Dr. Brett Fairy. Since last visit she has had no new cardiac symptoms. She has not had any dizziness or syncope. No chest pain. She has had a past history of unintentional weight loss but since her last visit she has had no change in weight. The patient had a chest x-ray on 02/19/14 which showed no abnormality. She complains of feeling tired and worn out. She has had extensive lab work by her PCP which did not show any evidence of malignancy. She had a CT scan of the abdomen and pelvis on 08/08/14 which did not show any abdominal aortic aneurysm or other cause of weight loss. She complains of dyspnea and not being able to get a complete breath. Impression The patient developed a fractured right hip. Her husband and she notes that she never fell. Her hip just started to hurt. X-rays confirmed a fracture and Dr. Rhona Raider operated on her on 01/03/15. She is walking with a cane. Her recent pacemaker interrogation revealed that she was having episodes of paroxysmal atrial fibrillation 1% of the time.  This does put her at increased risk for thromboembolic disease.  January 07, 2016:  Was seen with her husband today . She has some dementia and husband answered most of the detailed questions .   Was started on Eliquis for PAF by Dr. Mare Ferrari at her last visit Tolerating it well.  Sees Dr. Chalmers Cater for hypothyroidism   [name], [name] She has a history of paroxysmal atrial fib . She has dementia and most of the questions were answered by her husband. Has a slight cough No bleeding with the Eliquis   [name], [name], [name])  for follow-up of her paroxysmal atrial for ablation. Is a history of dementia and most of her questions were answered by her husband.  She has a pacemaker. Had some stomach issues.      Past Medical History:  Diagnosis Date  . A-fib ([place])   . Allergy   . Arthritis   . Asthma   . Colon polyps   . Complete heart block -intermittent    a. 11/2012 s/p SJM Accent DR RF dc ppm.  . Depression   . Environmental allergies   . Hyperlipidemia   . Memory retention disorder 03/22/2013  . Osteoarthritis    hands  . Thyroid disease     Past Surgical History:  Procedure Laterality Date  . APPENDECTOMY  1965  . CERVICAL POLYPECTOMY  1996  . COLONOSCOPY  2007   adenomatous polyps  . EYE  SURGERY    . HAND SURGERY    . HIP ARTHROPLASTY Right 01/03/2015   Procedure: ARTHROPLASTY BIPOLAR HIP (HEMIARTHROPLASTY);  Surgeon: [name], MD;  Location: [facility];  Service: Orthopedics;  Laterality: Right;  . INSERT / REPLACE / REMOVE PACEMAKER  12/02/2012    Dr Lovena Le  . KNEE SURGERY    . ovarian cystectomy  1965  . PERMANENT PACEMAKER INSERTION N/A 12/02/2012   Procedure: PERMANENT PACEMAKER INSERTION;  Surgeon: Evans Lance, MD;  Location: [facility] CATH LAB;  Service: Cardiovascular;  Laterality: N/A;  . SALPINGOOPHORECTOMY     right  . tumor removed  1985   abdomen     Current Outpatient Medications  Medication Sig Dispense Refill  . alendronate (FOSAMAX) 70  MG tablet Take 70 mg by mouth once a week. Take with a full glass of water on an empty stomach.    . Calcium Carbonate (CALCIUM-CARB 600 PO) Take 1 tablet by mouth daily.     . cholecalciferol (VITAMIN D) 1000 units tablet Take 1,000 Units by mouth daily.    . fluticasone (FLONASE) 50 MCG/ACT nasal spray Place 2 sprays into both nostrils daily.    Marland Kitchen levothyroxine (SYNTHROID, LEVOTHROID) 75 MCG tablet Take 75 mcg by mouth daily before breakfast.     . LORazepam (ATIVAN) 0.5 MG tablet TAKE 1 TABLET BY MOUTH TWICE DAILY AS NEEDED FOR ANXIETY 60 tablet 5  . Melatonin 5 MG TABS Take 5 mg by mouth 3 times/day as needed-between meals & bedtime.     Marland Kitchen NAMZARIC 28-10 MG CP24 TAKE 1 CAPSULE BY MOUTH AT BEDTIME 90 capsule 3  . vitamin B-12 (CYANOCOBALAMIN) 500 MCG tablet Take 500 mcg by mouth daily.     Marland Kitchen apixaban (ELIQUIS) 2.5 MG TABS tablet Take 1 tablet (2.5 mg total) by mouth 2 (two) times daily. 60 tablet 11   No current facility-administered medications for this visit.     Allergies:   Sulfa antibiotics and Sulfasalazine    Social History:  The patient  reports that she has never smoked. She has never used smokeless tobacco. She reports that she drinks alcohol. She reports that she does not use drugs.   Family History:  The patient's family history includes Colon cancer in her father; Colon polyps in her brother; Coronary artery disease in her brother; Diabetes in her brother.    ROS:  Please see the history of present illness.   Otherwise, review of systems are positive for none.   All other systems are reviewed and negative.    Physical Exam: Blood pressure 104/60, pulse 65, height 5' 6.5" (1.689 m), weight 130 lb (59 kg), SpO2 98 %.  GEN:   Elderly, demented female. HEENT: Normal NECK: No JVD; No carotid bruits LYMPHATICS: No lymphadenopathy CARDIAC: RRR  RESPIRATORY:  Clear to auscultation without rales, wheezing or rhonchi  ABDOMEN: Soft, non-tender, non-distended MUSCULOSKELETAL:   No edema; No deformity  SKIN: Warm and dry NEUROLOGIC: Polite but demented.   EKG:    Recent Labs: 02/28/2018: TSH 2.98 03/30/2018: ALT 18; BUN 22; Creatinine, Ser 0.95; Hemoglobin 14.4; Platelets 207; Potassium 3.8; Sodium 141    Lipid Panel    Component Value Date/Time   CHOL 232 (H) 02/28/2018 1108   TRIG 79 02/28/2018 1108   HDL 76 02/28/2018 1108   CHOLHDL 3.1 02/28/2018 1108   VLDL 20 02/04/2017 0937   LDLCALC 138 (H) 02/28/2018 1108   LDLDIRECT 127.6 04/21/2013 1127      Wt Readings from Last  3 Encounters:  04/20/18 130 lb (59 kg)  03/31/18 133 lb 9.6 oz (60.6 kg)  03/08/18 134 lb (60.8 kg)       ASSESSMENT AND PLAN:  1. Functioning St. Jude dual-chamber pacemaker f Help with EP. 2. memory disorder: She has fairly significant dementia.  She could not answer any challenging questions 3. Hypercholesterolemia: Is being managed by her medical doctor 4. Hypothyroidism  5.  Paroxysmal atrial fibrillation. This patients CHA2DS2-VASc Score is 3 for age and female sex. Above score calculated as 1 point each if present [CHF, HTN, DM, Vascular=MI/PAD/Aortic Plaque, Age if 65-74, or Female] Above score calculated as 2 points each if present [Age > 75, or Stroke/TIA/TE]  Eliquis 5 mg twice a day.  She has lost weight and now weighs 59 kg.  Her age is 83 years old.  We will decrease the dose of Eliquis to 2.5 mg twice a day.   Current medicines are reviewed at length with the patient today.  The patient does not have concerns regarding medicines.  The following changes have been made:  We discussed the risks and benefits of anticoagulation.  She does not have any history of blood loss anemia.  We will start her on Apixaban 5 mg twice a day.  This is the proper dose for her.  She weighs 66 kg and age is less than 30.  Renal function has been normal in the past.  Follow up in 6 months with Vin .   Mertie Moores, MD  04/20/2018 10:23 AM    [name],  [name], [name]  [id number] Pager [phone] Phone: [phone]; Fax: [phone]

## 2018-04-20 NOTE — Patient Instructions (Signed)
Medication Instructions:  Your physician has recommended you make the following change in your medication:   DECREASE Eliquis 2.5 mg twice daily  If you need a refill on your cardiac medications before your next appointment, please call your pharmacy.    Lab work: None Ordered   Testing/Procedures: None Ordered   Follow-Up: At [facility], you and your health needs are our priority.  As part of our continuing mission to provide you with exceptional heart care, we have created designated Provider Care Teams.  These Care Teams include your primary Cardiologist (physician) and Advanced Practice Providers (APPs -  Physician Assistants and Nurse Practitioners) who all work together to provide you with the care you need, when you need it. You will need a follow up appointment in:  6 months.  Please call our office 2 months in advance to schedule this appointment.  You may see Mertie Moores, MD or one of the following Advanced Practice Providers on your designated Care Team: Richardson Dopp, PA-C [place], [place] . Daune Perch, NP

## 2018-05-25 ENCOUNTER — Ambulatory Visit (INDEPENDENT_AMBULATORY_CARE_PROVIDER_SITE_OTHER): Payer: Medicare Other

## 2018-05-25 DIAGNOSIS — I442 Atrioventricular block, complete: Secondary | ICD-10-CM | POA: Diagnosis not present

## 2018-05-25 NOTE — Progress Notes (Signed)
Remote pacemaker transmission.   

## 2018-05-31 ENCOUNTER — Encounter: Payer: Self-pay | Admitting: Cardiology

## 2018-06-09 ENCOUNTER — Ambulatory Visit: Payer: Medicare Other | Admitting: Internal Medicine

## 2018-06-09 ENCOUNTER — Encounter: Payer: Self-pay | Admitting: Internal Medicine

## 2018-06-09 VITALS — BP 80/50 | HR 78 | Temp 98.1°F | Ht 66.5 in | Wt 126.0 lb

## 2018-06-09 DIAGNOSIS — R634 Abnormal weight loss: Secondary | ICD-10-CM

## 2018-06-09 DIAGNOSIS — Z8673 Personal history of transient ischemic attack (TIA), and cerebral infarction without residual deficits: Secondary | ICD-10-CM

## 2018-06-09 DIAGNOSIS — E869 Volume depletion, unspecified: Secondary | ICD-10-CM

## 2018-06-09 DIAGNOSIS — Z950 Presence of cardiac pacemaker: Secondary | ICD-10-CM | POA: Diagnosis not present

## 2018-06-09 DIAGNOSIS — L659 Nonscarring hair loss, unspecified: Secondary | ICD-10-CM | POA: Diagnosis not present

## 2018-06-09 DIAGNOSIS — E039 Hypothyroidism, unspecified: Secondary | ICD-10-CM

## 2018-06-09 DIAGNOSIS — R413 Other amnesia: Secondary | ICD-10-CM

## 2018-06-09 LAB — POCT URINALYSIS DIPSTICK
Appearance: NORMAL
Bilirubin, UA: NEGATIVE
Glucose, UA: NEGATIVE
Ketones, UA: NEGATIVE
LEUKOCYTES UA: NEGATIVE
NITRITE UA: NEGATIVE
Odor: NORMAL
PROTEIN UA: POSITIVE — AB
SPEC GRAV UA: 1.02 (ref 1.010–1.025)
Urobilinogen, UA: 0.2 E.U./dL
pH, UA: 6.5 (ref 5.0–8.0)

## 2018-06-09 LAB — TSH: TSH: 2.02 mIU/L (ref 0.40–4.50)

## 2018-06-09 NOTE — Progress Notes (Signed)
   Subjective:    Patient ID: [name], female    DOB: 11/17/1934, 83 y.o.   MRN: [id number]  HPI 83 year old Female with dementia for weight check. Appetite was fairly good until a couple of weeks ago.  Accompanied by her caregiver today.  Apparently she fell yesterday and struck her back.  Was taken for an x-ray which was negative.  She does not seem to have issues with agitation and is generally cooperative.  History of atrial fibrillation and history of heart block status post pacemaker placement  History of hypothyroidism with stable TSH on thyroid replacement  In September she weighed 134 pounds here in this office and now weighs 126 pounds.  Spoke with caregiver about the possibility of giving her Megace.  She would like for me to discuss with husband.  Husband is not with her today.   Review of Systems no complaint of back pain today, caretaker says patient has hair loss so TSH will be checked again.  She is on chronic anticoagulation for atrial fibrillation.     Objective:   Physical Exam Blood pressure is 80/50 today.  I suspect she has some volume depletion.  Has complained of some dizziness recently.  Chest clear.  Cardiac exam regular rate and rhythm.  Extremities without edema.  Ambulating without difficulty.       Assessment & Plan:  Weight loss  Memory loss  Atrial fibrillation  History of heart block status post pacemaker insertion  Hypothyroidism with stable TSH on thyroid replacement  Plan: Continue to monitor weight loss.  Leave message with husband regarding giving her Megace.  He has not wanted to treat memory loss previously.  I suspect he will want to give her appetite stimulant either.  Pre-albumin is low normal at 19.  TSH is normal.  CBC is normal.  Urine specific gravity is 1.020 and consistent with volume depletion.  BUN is 1.52 and was normal in October.  Suspect she is volume depleted.  Will need follow-up with volume depletion .  Appointment for  January 13

## 2018-06-10 LAB — CBC WITH DIFFERENTIAL/PLATELET
Absolute Monocytes: 785 cells/uL (ref 200–950)
Basophils Absolute: 43 cells/uL (ref 0–200)
Basophils Relative: 0.6 %
EOS PCT: 1.7 %
Eosinophils Absolute: 122 cells/uL (ref 15–500)
HCT: 41.8 % (ref 35.0–45.0)
Hemoglobin: 14.3 g/dL (ref 11.7–15.5)
Lymphs Abs: 1109 cells/uL (ref 850–3900)
MCH: 32.1 pg (ref 27.0–33.0)
MCHC: 34.2 g/dL (ref 32.0–36.0)
MCV: 93.9 fL (ref 80.0–100.0)
MPV: 9.3 fL (ref 7.5–12.5)
Monocytes Relative: 10.9 %
NEUTROS PCT: 71.4 %
Neutro Abs: 5141 cells/uL (ref 1500–7800)
PLATELETS: 219 10*3/uL (ref 140–400)
RBC: 4.45 10*6/uL (ref 3.80–5.10)
RDW: 12.2 % (ref 11.0–15.0)
TOTAL LYMPHOCYTE: 15.4 %
WBC: 7.2 10*3/uL (ref 3.8–10.8)

## 2018-06-10 LAB — COMPLETE METABOLIC PANEL WITH GFR
AG Ratio: 1.9 (calc) (ref 1.0–2.5)
ALKALINE PHOSPHATASE (APISO): 58 U/L (ref 33–130)
ALT: 10 U/L (ref 6–29)
AST: 20 U/L (ref 10–35)
Albumin: 4.3 g/dL (ref 3.6–5.1)
BUN/Creatinine Ratio: 22 (calc) (ref 6–22)
BUN: 33 mg/dL — ABNORMAL HIGH (ref 7–25)
CO2: 26 mmol/L (ref 20–32)
CREATININE: 1.52 mg/dL — AB (ref 0.60–0.88)
Calcium: 10.2 mg/dL (ref 8.6–10.4)
Chloride: 104 mmol/L (ref 98–110)
GFR, Est African American: 37 mL/min/{1.73_m2} — ABNORMAL LOW (ref >=60)
GFR, Est Non African American: 32 mL/min/{1.73_m2} — ABNORMAL LOW (ref >=60)
GLUCOSE: 53 mg/dL — AB (ref 65–99)
Globulin: 2.3 g/dL (calc) (ref 1.9–3.7)
Potassium: 3.9 mmol/L (ref 3.5–5.3)
Sodium: 142 mmol/L (ref 135–146)
Total Bilirubin: 0.4 mg/dL (ref 0.2–1.2)
Total Protein: 6.6 g/dL (ref 6.1–8.1)

## 2018-06-10 LAB — PREALBUMIN: Prealbumin: 19 mg/dL (ref 17–34)

## 2018-06-18 NOTE — Patient Instructions (Addendum)
Left voicemail message for husband regarding use of Megace.  She is volume depleted and needs to force fluids.  Follow-up in a couple of weeks.  TSH is normal.  Free albumin is low normal.

## 2018-07-04 ENCOUNTER — Encounter: Payer: Self-pay | Admitting: Internal Medicine

## 2018-07-04 ENCOUNTER — Ambulatory Visit: Payer: Medicare Other | Admitting: Internal Medicine

## 2018-07-04 VITALS — BP 100/70 | HR 60 | Temp 98.2°F | Ht 66.5 in | Wt 130.0 lb

## 2018-07-04 DIAGNOSIS — Z950 Presence of cardiac pacemaker: Secondary | ICD-10-CM | POA: Diagnosis not present

## 2018-07-04 DIAGNOSIS — R413 Other amnesia: Secondary | ICD-10-CM | POA: Diagnosis not present

## 2018-07-04 DIAGNOSIS — R634 Abnormal weight loss: Secondary | ICD-10-CM | POA: Diagnosis not present

## 2018-07-04 NOTE — Progress Notes (Signed)
   Subjective:    Patient ID: [name], female    DOB: 1935/05/08, 84 y.o.   MRN: [id number]  HPI She is here today to follow-up on her weight.  She is accompanied by her caretaker and her husband is in the waiting room.  In September she weighed 134 pounds here.  In December she weighed 126 pounds.  She now weighs 130 pounds.  Caretaker tells me that she is now preparing dinner for the patient and that may be making a difference.  Patient looks well and has no new complaints but remains disoriented.  She is followed by neurology for memory disturbance.  Review of old records indicate this is been going on for a number of years.  Records indicate she was initially evaluated in 2014 by Dr. Brett Fairy for memory loss.   She had a syncopal episode while shopping in June 2014 and was found to have Mobitz II rhythm.  She also was noted to have a 5-second pause on cardiac monitoring with EMS at the time of her syncopal episode.  She refused admission but consented to readmission on June 13 at which time she received a pacemaker.    Review of Systems no complaints     Objective:   Physical Exam Skin warm and dry.  Nodes none.  Chest clear.  Cardiac exam reveals regular rate and rhythm.  Extremities without edema.  Not sure day of week or year.  She is cooperative.       Assessment & Plan:  Memory disturbance followed by Neurology  Pacemaker  Weight loss but has gained 4 pounds since caretaker has begun fixing dinner for patient  Plan: We will follow-up with her in 3 months.

## 2018-07-15 LAB — CUP PACEART REMOTE DEVICE CHECK
Brady Statistic AS VP Percent: 27 %
Brady Statistic AS VS Percent: 48 %
Implantable Lead Implant Date: 20140613
Implantable Lead Location: 753860
Lead Channel Pacing Threshold Amplitude: 1 V
Lead Channel Pacing Threshold Pulse Width: 0.5 ms
Lead Channel Sensing Intrinsic Amplitude: 12 mV
Lead Channel Setting Pacing Amplitude: 2 V
Lead Channel Setting Pacing Amplitude: 2.5 V
Lead Channel Setting Pacing Pulse Width: 0.5 ms
MDC IDC LEAD IMPLANT DT: 20140613
MDC IDC LEAD LOCATION: 753859
MDC IDC MSMT BATTERY REMAINING LONGEVITY: 106 mo
MDC IDC MSMT BATTERY REMAINING PERCENTAGE: 95.5 %
MDC IDC MSMT BATTERY VOLTAGE: 2.98 V
MDC IDC MSMT LEADCHNL RA IMPEDANCE VALUE: 280 Ohm
MDC IDC MSMT LEADCHNL RA PACING THRESHOLD AMPLITUDE: 0.5 V
MDC IDC MSMT LEADCHNL RA PACING THRESHOLD PULSEWIDTH: 0.5 ms
MDC IDC MSMT LEADCHNL RA SENSING INTR AMPL: 3.6 mV
MDC IDC MSMT LEADCHNL RV IMPEDANCE VALUE: 340 Ohm
MDC IDC PG IMPLANT DT: 20140613
MDC IDC SESS DTM: 20191204083237
MDC IDC SET LEADCHNL RV SENSING SENSITIVITY: 2 mV
MDC IDC STAT BRADY AP VP PERCENT: 21 %
MDC IDC STAT BRADY AP VS PERCENT: 3 %
MDC IDC STAT BRADY RA PERCENT PACED: 23 %
MDC IDC STAT BRADY RV PERCENT PACED: 48 %
Pulse Gen Model: 2210
Pulse Gen Serial Number: 7474456

## 2018-07-17 NOTE — Patient Instructions (Signed)
It was a pleasure to see you today.  We are glad you have gained 4 pounds.  Follow-up in April.

## 2018-07-21 ENCOUNTER — Ambulatory Visit: Payer: Medicare Other | Admitting: Internal Medicine

## 2018-07-21 ENCOUNTER — Encounter: Payer: Self-pay | Admitting: Internal Medicine

## 2018-07-21 VITALS — Ht 66.5 in

## 2018-07-21 DIAGNOSIS — W19XXXA Unspecified fall, initial encounter: Secondary | ICD-10-CM | POA: Diagnosis not present

## 2018-07-21 DIAGNOSIS — J22 Unspecified acute lower respiratory infection: Secondary | ICD-10-CM | POA: Diagnosis not present

## 2018-07-21 DIAGNOSIS — R413 Other amnesia: Secondary | ICD-10-CM | POA: Diagnosis not present

## 2018-07-21 MED ORDER — AZITHROMYCIN 250 MG PO TABS: ORAL_TABLET | ORAL | 0 refills | Status: DC

## 2018-07-21 MED ORDER — BENZONATATE 100 MG PO CAPS: 100.0000 mg | ORAL_CAPSULE | Freq: Three times a day (TID) | ORAL | 0 refills | Status: DC

## 2018-07-21 NOTE — Patient Instructions (Signed)
Tessalon Perles 100 mg 3 times a day as needed for cough.  Zithromax Z-PAK take 2 tablets day 1 followed by 1 tablet days 2 through 5.

## 2018-07-21 NOTE — Progress Notes (Signed)
   Subjective:    Patient ID: [name], female    DOB: September 09, 1934, 84 y.o.   MRN: [id number]  HPI 84 year old White female with history of memory loss in today accompanied by caretaker and her husband.  Husband indicates they are considering entering a retirement facility that has memory care.  He has been looking around.  He realizes that they probably should not be living alone.  Last night his wife fell and I think he had some difficulty getting her out of the floor.  Caretaker is not present 24 hours a day.  Caretaker indicates she has been coughing for 5 days without fever.    Review of Systems patient denies sore throat.  She blows her nose frequently and has been doing that prior to this visit as well.     Objective:   Physical Exam  Skin warm and dry.  Nodes none.  TMs are slightly full bilaterally.  Pharynx is clear.  Neck is supple.  Chest clear to auscultation without rales or wheezing.   There is no bruising related to her fall last evening     Assessment & Plan:  Memory loss treated by neurologist with Namzaric   Acute lower respiratory infection  Fall at home  Plan: Zithromax Z-PAK take 2 tablets day 1 followed by 1 tablet days 2 through 5.  Tessalon Perles 100 mg 3 times a day as needed for cough

## 2018-08-24 ENCOUNTER — Ambulatory Visit (INDEPENDENT_AMBULATORY_CARE_PROVIDER_SITE_OTHER): Payer: Medicare Other | Admitting: *Deleted

## 2018-08-24 DIAGNOSIS — I442 Atrioventricular block, complete: Secondary | ICD-10-CM | POA: Diagnosis not present

## 2018-08-24 LAB — CUP PACEART REMOTE DEVICE CHECK
Battery Remaining Longevity: 103 mo
Brady Statistic AP VS Percent: 2.7 %
Brady Statistic AS VP Percent: 27 %
Brady Statistic AS VS Percent: 47 %
Brady Statistic RV Percent Paced: 48 %
Date Time Interrogation Session: 20200304070429
Implantable Lead Implant Date: 20140613
Implantable Lead Location: 753860
Lead Channel Impedance Value: 340 Ohm
Lead Channel Pacing Threshold Amplitude: 0.5 V
Lead Channel Pacing Threshold Amplitude: 1 V
Lead Channel Pacing Threshold Pulse Width: 0.5 ms
Lead Channel Sensing Intrinsic Amplitude: 11.8 mV
Lead Channel Setting Pacing Amplitude: 2 V
Lead Channel Setting Pacing Amplitude: 2.5 V
MDC IDC LEAD IMPLANT DT: 20140613
MDC IDC LEAD LOCATION: 753859
MDC IDC MSMT BATTERY REMAINING PERCENTAGE: 95.5 %
MDC IDC MSMT BATTERY VOLTAGE: 2.96 V
MDC IDC MSMT LEADCHNL RA PACING THRESHOLD PULSEWIDTH: 0.5 ms
MDC IDC MSMT LEADCHNL RA SENSING INTR AMPL: 3.2 mV
MDC IDC MSMT LEADCHNL RV IMPEDANCE VALUE: 380 Ohm
MDC IDC PG IMPLANT DT: 20140613
MDC IDC SET LEADCHNL RV PACING PULSEWIDTH: 0.5 ms
MDC IDC SET LEADCHNL RV SENSING SENSITIVITY: 2 mV
MDC IDC STAT BRADY AP VP PERCENT: 21 %
MDC IDC STAT BRADY RA PERCENT PACED: 23 %
Pulse Gen Model: 2210
Pulse Gen Serial Number: 7474456

## 2018-09-01 ENCOUNTER — Telehealth: Payer: Self-pay | Admitting: Internal Medicine

## 2018-09-01 ENCOUNTER — Encounter: Payer: Self-pay | Admitting: Cardiology

## 2018-09-01 NOTE — Telephone Encounter (Signed)
LVM to CB and reschedule appointment, Dr Renold Genta out of office 4.13.2020

## 2018-09-01 NOTE — Progress Notes (Signed)
Remote pacemaker transmission.   

## 2018-09-08 ENCOUNTER — Other Ambulatory Visit: Payer: Self-pay | Admitting: Internal Medicine

## 2018-09-19 ENCOUNTER — Other Ambulatory Visit: Payer: Self-pay | Admitting: Adult Health

## 2018-10-03 ENCOUNTER — Ambulatory Visit: Payer: Medicare Other | Admitting: Internal Medicine

## 2018-10-06 ENCOUNTER — Other Ambulatory Visit: Payer: Self-pay

## 2018-10-06 ENCOUNTER — Encounter: Payer: Self-pay | Admitting: Internal Medicine

## 2018-10-06 ENCOUNTER — Ambulatory Visit (INDEPENDENT_AMBULATORY_CARE_PROVIDER_SITE_OTHER): Payer: Medicare Other | Admitting: Internal Medicine

## 2018-10-06 VITALS — BP 110/68 | Temp 98.0°F | Wt 130.1 lb

## 2018-10-06 DIAGNOSIS — R413 Other amnesia: Secondary | ICD-10-CM

## 2018-10-06 DIAGNOSIS — R634 Abnormal weight loss: Secondary | ICD-10-CM | POA: Diagnosis not present

## 2018-10-06 NOTE — Progress Notes (Signed)
   Subjective:    Patient ID: [name], female    DOB: January 19, 1935, 84 y.o.   MRN: [id number]  HPI 84 year old Female scheduled for weight check in the office however visit was canceled due to coronavirus pandemic.  Interactive audio and video telecommunications were attempted between this provider and patient's husband however patient and husband do not have access to video capability.  We continue speaking with husband with audio only.  2 identifiers were used.  Patient has dementia and really is a very poor historian with regard to her health.  She requires caretakers to be with her on a regular basis and is fortunate to have those caretakers available.  Husband is very supportive.  He would like for her to stay at home as long as she can with her dementia.  So far he has been able to manage her at home.  At one point we were concerned about her weight.  He says her weight at home is 130.2 pounds.  She was last seen in the office July 21, 2018.  Prior to that she was seen in the office July 04, 2018.  At that time weight 130 pounds.  Her weight improved once caretaker started fixing her dinner.  It sounds like she is getting along fairly well and enjoys her caretakers according to her husband.  Her weight is stable.  We will plan to see her in 3 to 6 months once pandemic dissipates.  We can be in touch by phone at any time.  He was reassured and advised to call with concerns.    Review of Systems     Objective:   Physical Exam        Assessment & Plan:

## 2018-10-06 NOTE — Patient Instructions (Signed)
Husband and caretakers will continue to monitor patient's weight and any other symptoms.  Call with concerns.  Schedule follow-up appointment in 3  months or later if pandemic persists.

## 2018-10-12 ENCOUNTER — Encounter: Payer: Self-pay | Admitting: Podiatry

## 2018-10-12 ENCOUNTER — Ambulatory Visit: Payer: Medicare Other | Admitting: Podiatry

## 2018-10-12 ENCOUNTER — Other Ambulatory Visit: Payer: Self-pay

## 2018-10-12 VITALS — Temp 97.3°F

## 2018-10-12 DIAGNOSIS — M79674 Pain in right toe(s): Secondary | ICD-10-CM

## 2018-10-12 DIAGNOSIS — M79675 Pain in left toe(s): Secondary | ICD-10-CM | POA: Diagnosis not present

## 2018-10-12 DIAGNOSIS — B351 Tinea unguium: Secondary | ICD-10-CM

## 2018-10-12 DIAGNOSIS — L600 Ingrowing nail: Secondary | ICD-10-CM

## 2018-10-12 NOTE — Patient Instructions (Signed)

## 2018-10-12 NOTE — Progress Notes (Signed)
Subjective:   Patient ID: [name], female   DOB: 84 y.o.   MRN: [id number]   HPI Patient presents with caregiver with severely thickened nailbeds 1-5 both feet that are dystrophic and they cannot cut.  States that gradually become more aggravated and harder to wear shoe gear with.  Patient does not have good orientation and does have dementia but is here with caregiver and does not smoke   Review of Systems  All other systems reviewed and are negative.       Objective:  Physical Exam Vitals signs and nursing note reviewed.  Constitutional:      Appearance: She is well-developed.  Pulmonary:     Effort: Pulmonary effort is normal.  Musculoskeletal: Normal range of motion.  Skin:    General: Skin is warm.  Neurological:     Mental Status: She is alert.     Neurovascular status found to be moderately diminished with patient having thickened dystrophic nailbeds 1-5 both feet that are painful when palpated and make wearing shoe gear difficult with significant elongation and impossibility for them to cut themselves chronic     Assessment:  Chronic mycotic nail infections 1-5 both feet that are very thickened and painful and they cannot be cut by family     Plan:  H&P and condition reviewed and today debridement of nailbeds 1-5 both feet accomplished with no iatrogenic bleeding and will continue with this type of treatment in the future

## 2018-10-12 NOTE — Progress Notes (Signed)
   Subjective:    Patient ID: [name], female    DOB: [birth year], 84 y.o.   MRN: [id number]  HPI    Review of Systems  All other systems reviewed and are negative.      Objective:   Physical Exam        Assessment & Plan:

## 2018-11-10 ENCOUNTER — Telehealth: Payer: Self-pay | Admitting: Nurse Practitioner

## 2018-11-10 NOTE — Telephone Encounter (Signed)
Patient's husband gave consent for patient to participate in telephone visit.  YOUR CARDIOLOGY TEAM HAS ARRANGED FOR AN E-VISIT FOR YOUR APPOINTMENT - PLEASE REVIEW IMPORTANT INFORMATION BELOW SEVERAL DAYS PRIOR TO YOUR APPOINTMENT  Due to the recent COVID-19 pandemic, we are transitioning in-person office visits to tele-medicine visits in an effort to decrease unnecessary exposure to our patients, their families, and staff. These visits are billed to your insurance just like a normal visit is. We also encourage you to sign up for MyChart if you have not already done so. You will need a smartphone if possible. For patients that do not have this, we can still complete the visit using a regular telephone but do prefer a smartphone to enable video when possible. You may have a family member that lives with you that can help. If possible, we also ask that you have a blood pressure cuff and scale at home to measure your blood pressure, heart rate and weight prior to your scheduled appointment. Patients with clinical needs that need an in-person evaluation and testing will still be able to come to the office if absolutely necessary. If you have any questions, feel free to call our office.     YOUR PROVIDER WILL BE USING THE FOLLOWING PLATFORM TO COMPLETE YOUR VISIT: TELEPHONE IF USING MYCHART - How to Download the MyChart App to Your SmartPhone     2-3 [place] will receive a telephone call from one of our [place] team members - your caller ID may say "Unknown caller." If this is a video visit, we will walk you through how to get the video launched on your phone. We will remind you check your blood pressure, heart rate and weight prior to your scheduled appointment. If you have an Apple Watch or Kardia, please upload any pertinent ECG strips the day before or morning of your appointment to [place]. Our staff will also make sure you have reviewed the consent and agree to move  forward with your scheduled tele-health visit.     THE DAY OF YOUR APPOINTMENT  Approximately 15 minutes prior to your scheduled appointment, you will receive a telephone call from one of [place] team - your caller ID may say "Unknown caller."  Our staff will confirm medications, vital signs for the day and any symptoms you may be experiencing. Please have this information available prior to the time of visit start. It may also be helpful for you to have a pad of paper and pen handy for any instructions given during your visit. They will also walk you through joining the smartphone meeting if this is a video visit.    CONSENT FOR TELE-HEALTH VISIT - PLEASE REVIEW  I hereby voluntarily request, consent and authorize [facility] and its employed or contracted physicians, physician assistants, nurse practitioners or other licensed health care professionals (the Practitioner), to provide me with telemedicine health care services (the "Services") as deemed necessary by the treating Practitioner. I acknowledge and consent to receive the Services by the Practitioner via telemedicine. I understand that the telemedicine visit will involve communicating with the Practitioner through live audiovisual communication technology and the disclosure of certain medical information by electronic transmission. I acknowledge that I have been given the opportunity to request an in-person assessment or other available alternative prior to the telemedicine visit and am voluntarily participating in the telemedicine visit.  I understand that I have the right to withhold or withdraw my consent to the use of telemedicine  in the course of my care at any time, without affecting my right to future care or treatment, and that the Practitioner or I may terminate the telemedicine visit at any time. I understand that I have the right to inspect all information obtained and/or recorded in the course of the telemedicine visit and may  receive copies of available information for a reasonable fee.  I understand that some of the potential risks of receiving the Services via telemedicine include:  Marland Kitchen Delay or interruption in medical evaluation due to technological equipment failure or disruption; . Information transmitted may not be sufficient (e.g. poor resolution of images) to allow for appropriate medical decision making by the Practitioner; and/or  . In rare instances, security protocols could fail, causing a breach of personal health information.  Furthermore, I acknowledge that it is my responsibility to provide information about my medical history, conditions and care that is complete and accurate to the best of my ability. I acknowledge that Practitioner's advice, recommendations, and/or decision may be based on factors not within their control, such as incomplete or inaccurate data provided by me or distortions of diagnostic images or specimens that may result from electronic transmissions. I understand that the practice of medicine is not an exact science and that Practitioner makes no warranties or guarantees regarding treatment outcomes. I acknowledge that I will receive a copy of this consent concurrently upon execution via email to the email address I last provided but may also request a printed copy by calling the office of [facility] [place].    I understand that my insurance will be billed for this visit.   I have read or had this consent read to me. . I understand the contents of this consent, which adequately explains the benefits and risks of the Services being provided via telemedicine.  . I have been provided ample opportunity to ask questions regarding this consent and the Services and have had my questions answered to my satisfaction. . I give my informed consent for the services to be provided through the use of telemedicine in my medical care  By participating in this telemedicine visit I agree to the above.

## 2018-11-10 NOTE — Telephone Encounter (Signed)
Left message for patient to call back regarding appointment that is scheduled with Dr. Acie Fredrickson for May 26

## 2018-11-15 ENCOUNTER — Telehealth (INDEPENDENT_AMBULATORY_CARE_PROVIDER_SITE_OTHER): Payer: Medicare Other | Admitting: Cardiovascular Disease

## 2018-11-15 ENCOUNTER — Other Ambulatory Visit: Payer: Self-pay

## 2018-11-15 ENCOUNTER — Encounter: Payer: Self-pay | Admitting: Cardiovascular Disease

## 2018-11-15 VITALS — BP 94/53 | HR 78 | Temp 98.3°F | Ht 68.0 in | Wt 130.0 lb

## 2018-11-15 DIAGNOSIS — Z7189 Other specified counseling: Secondary | ICD-10-CM | POA: Diagnosis not present

## 2018-11-15 DIAGNOSIS — I480 Paroxysmal atrial fibrillation: Secondary | ICD-10-CM | POA: Diagnosis not present

## 2018-11-15 NOTE — Progress Notes (Signed)
Virtual Visit via Telephone Note   This visit type was conducted due to national recommendations for restrictions regarding the COVID-19 Pandemic (e.g. social distancing) in an effort to limit this patient's exposure and mitigate transmission in our community.  Due to her co-morbid illnesses, this patient is at least at moderate risk for complications without adequate follow up.  This format is felt to be most appropriate for this patient at this time.  The patient did not have access to video technology/had technical difficulties with video requiring transitioning to audio format only (telephone).  All issues noted in this document were discussed and addressed.  No physical exam could be performed with this format.  Please refer to the patient's chart for her  consent to telehealth for [facility].   Date:  11/15/2018   ID:  [name], DOB 30-Nov-1934, MRN [id number]  Patient Location: Home Provider Location: Office  PCP:  Elby Showers, MD  Cardiologist:  Mertie Moores, MD  Electrophysiologist:  None   Evaluation Performed:  Follow-Up Visit  Problem List 1. Paroxysmal atrial fibrillation 2. Hyperlipidemia 3. 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 Hypothyroidism-      [name] is a 84 y.o. female who presents for a six-month follow-up visit  This 84 year old woman is seen for a scheduled followup office visit. She has a history of intermittent high-grade AV block and has a functioning [employer] dual-chamber pacemaker. She is followed for this by Dr. Lovena Le. She has a history of hypercholesterolemia. She has a history of osteoarthritis. She has also had some memory issues and is followed by Dr. Brett Fairy. Since last visit she has had no new cardiac symptoms. She has not had any dizziness or syncope. No chest pain. She has had a past history of unintentional weight loss but since her last visit she has had no change in weight. The patient had a chest x-ray on  02/19/14 which showed no abnormality. She complains of feeling tired and worn out. She has had extensive lab work by her PCP which did not show any evidence of malignancy. She had a CT scan of the abdomen and pelvis on 08/08/14 which did not show any abdominal aortic aneurysm or other cause of weight loss. She complains of dyspnea and not being able to get a complete breath. Impression The patient developed a fractured right hip. Her husband and she notes that she never fell. Her hip just started to hurt. X-rays confirmed a fracture and Dr. Rhona Raider operated on her on 01/03/15. She is walking with a cane. Her recent pacemaker interrogation revealed that she was having episodes of paroxysmal atrial fibrillation 1% of the time.  This does put her at increased risk for thromboembolic disease.  January 07, 2016:  Was seen with her husband today . She has some dementia and husband answered most of the detailed questions .   Was started on Eliquis for PAF by Dr. Mare Ferrari at her last visit Tolerating it well.  Sees Dr. Chalmers Cater for hypothyroidism   [name], [name] She has a history of paroxysmal atrial fib . She has dementia and most of the questions were answered by her husband. Has a slight cough No bleeding with the Eliquis   [name], [name], [name] Is a history of dementia and most of her questions  were answered by her husband.  She has a pacemaker. Had some stomach issues.       [name]    [name]   The patient does not have symptoms concerning for COVID-19 infection (fever, chills, cough, or new shortness of breath).    Past Medical History:  Diagnosis Date  . A-fib ([place])   . Allergy   . Arthritis    . Asthma   . Colon polyps   . Complete heart block -intermittent    a. 11/2012 s/p SJM Accent DR RF dc ppm.  . Depression   . Environmental allergies   . Hyperlipidemia   . Memory retention disorder 03/22/2013  . Osteoarthritis    hands  . Thyroid disease    Past Surgical History:  Procedure Laterality Date  . APPENDECTOMY  1965  . CERVICAL POLYPECTOMY  1996  . COLONOSCOPY  2007   adenomatous polyps  . EYE SURGERY    . HAND SURGERY    . HIP ARTHROPLASTY Right 01/03/2015   Procedure: ARTHROPLASTY BIPOLAR HIP (HEMIARTHROPLASTY);  Surgeon: [name], MD;  Location: [facility];  Service: Orthopedics;  Laterality: Right;  . INSERT / REPLACE / REMOVE PACEMAKER  12/02/2012    Dr Lovena Le  . KNEE SURGERY    . ovarian cystectomy  1965  . PERMANENT PACEMAKER INSERTION N/A 12/02/2012   Procedure: PERMANENT PACEMAKER INSERTION;  Surgeon: Evans Lance, MD;  Location: [facility] CATH LAB;  Service: Cardiovascular;  Laterality: N/A;  . SALPINGOOPHORECTOMY     right  . tumor removed  1985   abdomen     Current Meds  Medication Sig  . alendronate (FOSAMAX) 70 MG tablet Take 75 mg by mouth once a week. Take with a full glass of water on an empty stomach.   Marland Kitchen apixaban (ELIQUIS) 2.5 MG TABS tablet Take 1 tablet (2.5 mg total) by mouth 2 (two) times daily.  . Calcium Carbonate (CALCIUM-CARB 600 PO) Take 1 tablet by mouth daily.   . cholecalciferol (VITAMIN D) 1000 units tablet Take 1,000 Units by mouth daily.  . fluticasone (FLONASE) 50 MCG/ACT nasal spray Place 2 sprays into both nostrils daily.  Marland Kitchen levothyroxine (SYNTHROID, LEVOTHROID) 75 MCG tablet Take 75 mcg by mouth daily before breakfast.   . LORazepam (ATIVAN) 0.5 MG tablet TAKE 1 TABLET BY MOUTH TWICE DAILY AS NEEDED FOR ANXIETY  . Melatonin 5 MG TABS Take 5 mg by mouth 3 times/day as needed-between meals & bedtime.   Marland Kitchen NAMZARIC 28-10 MG CP24 TAKE 1 CAPSULE BY MOUTH AT BEDTIME  . vitamin B-12 (CYANOCOBALAMIN) 500 MCG tablet Take 500 mcg by  mouth daily.      Allergies:   Sulfa antibiotics and Sulfasalazine   Social History   Tobacco Use  . Smoking status: Never Smoker  . Smokeless tobacco: Never Used  Substance Use Topics  . Alcohol use: Yes    Alcohol/week: 0.0 standard drinks    Comment: occas. which is seldom  . Drug use: No     Family Hx: The patient's family history includes Colon cancer in her father; Colon polyps in her brother; Coronary artery disease in her brother; Diabetes in her brother.  ROS:   Please see the history of present illness.     All other systems reviewed and are negative.   Prior  CV studies:   The following studies were reviewed today:    Labs/Other Tests and Data Reviewed:    EKG:  No ECG reviewed.  Recent Labs: 06/09/2018: ALT 10; BUN 33; Creat 1.52; Hemoglobin 14.3; Platelets 219; Potassium 3.9; Sodium 142; TSH 2.02   Recent Lipid Panel Lab Results  Component Value Date/Time   CHOL 232 (H) 02/28/2018 11:08 AM   TRIG 79 02/28/2018 11:08 AM   HDL 76 02/28/2018 11:08 AM   CHOLHDL 3.1 02/28/2018 11:08 AM   LDLCALC 138 (H) 02/28/2018 11:08 AM   LDLDIRECT 127.6 04/21/2013 11:27 AM    Wt Readings from Last 3 Encounters:  11/15/18 130 lb (59 kg)  10/06/18 130 lb 2 oz (59 kg)  07/04/18 130 lb (59 kg)     Objective:    Vital Signs:  BP (!) 94/53 (BP Location: Left Arm, Patient Position: Sitting, Cuff Size: Normal)   Pulse 78   Temp 98.3 F (36.8 C)   Ht 5\' 8"  (1.727 m)   Wt 130 lb (59 kg)   BMI 19.77 kg/m    No exam except for VS   ASSESSMENT & PLAN:    1. PAF :   Is on eliquis .  No bleeding  2. Constipation :  Suggested metamucil or Ducosate     COVID-19 Education: The signs and symptoms of COVID-19 were discussed with the patient and how to seek care for testing (follow up with PCP or arrange E-visit).  The importance of social distancing was discussed today.  Time:   Today, I have spent 18  minutes with the patient with telehealth technology discussing  the above problems.     Medication Adjustments/Labs and Tests Ordered: Current medicines are reviewed at length with the patient today.  Concerns regarding medicines are outlined above.   Tests Ordered: No orders of the defined types were placed in this encounter.   Medication Changes: No orders of the defined types were placed in this encounter.   Disposition:  Follow up in 6 month(s)  Signed, Mertie Moores, MD  11/15/2018 3:46 PM    [facility]

## 2018-11-15 NOTE — Patient Instructions (Signed)

## 2018-11-23 ENCOUNTER — Ambulatory Visit (INDEPENDENT_AMBULATORY_CARE_PROVIDER_SITE_OTHER): Payer: Medicare Other | Admitting: *Deleted

## 2018-11-23 DIAGNOSIS — I442 Atrioventricular block, complete: Secondary | ICD-10-CM

## 2018-11-24 LAB — CUP PACEART REMOTE DEVICE CHECK
Battery Remaining Longevity: 103 mo
Battery Remaining Percentage: 95.5 %
Battery Voltage: 2.96 V
Brady Statistic AP VP Percent: 22 %
Brady Statistic AP VS Percent: 2.7 %
Brady Statistic AS VP Percent: 26 %
Brady Statistic AS VS Percent: 48 %
Brady Statistic RA Percent Paced: 24 %
Brady Statistic RV Percent Paced: 48 %
Date Time Interrogation Session: 20200603073713
Implantable Lead Implant Date: 20140613
Implantable Lead Implant Date: 20140613
Implantable Lead Location: 753859
Implantable Lead Location: 753860
Implantable Pulse Generator Implant Date: 20140613
Lead Channel Impedance Value: 340 Ohm
Lead Channel Impedance Value: 380 Ohm
Lead Channel Sensing Intrinsic Amplitude: 12 mV
Lead Channel Sensing Intrinsic Amplitude: 2.9 mV
Lead Channel Setting Pacing Amplitude: 2 V
Lead Channel Setting Pacing Amplitude: 2.5 V
Lead Channel Setting Pacing Pulse Width: 0.5 ms
Lead Channel Setting Sensing Sensitivity: 2 mV
Pulse Gen Model: 2210
Pulse Gen Serial Number: 7474456

## 2018-12-02 ENCOUNTER — Telehealth: Payer: Self-pay | Admitting: Internal Medicine

## 2018-12-02 ENCOUNTER — Encounter: Payer: Self-pay | Admitting: Cardiology

## 2018-12-02 NOTE — Progress Notes (Signed)
Remote pacemaker transmission.   

## 2018-12-02 NOTE — Telephone Encounter (Signed)
Pt had alert for what appears to be 33 beat of NSVT . Spoke with husband. Pt asymptomatic. No syncope, dizziness, cp or SHOB.

## 2018-12-06 ENCOUNTER — Telehealth: Payer: Self-pay | Admitting: *Deleted

## 2018-12-06 NOTE — Telephone Encounter (Signed)
Due to current COVID 19 pandemic, our office is severely reducing in office visits until further notice, in order to minimize the risk to our patients and healthcare providers. Unable to get in contact with the patient to convert their office visit with Megan on 12/07/2018 into a doxy.me visit. I left a voicemail asking the patient to return my call. Office number was provided.     If patient calls back please convert their office visit into a doxy.me visit.      

## 2018-12-06 NOTE — Telephone Encounter (Signed)
Called and spoke to daughter, angela.  Consented to doxy.me (daughter to be present).  Due to current COVID 19 pandemic, our office is severely reducing in office visits until further notice, in order to minimize the risk to our patients and healthcare providers.  Pt understands that although there may be some limitations with this type of visit, we will take all precautions to reduce any security or privacy concerns.  Pt understands that this will be treated like an in office visit and we will file with pt's insurance, and there may be a patient responsible charge related to this service. email sent. [email or web address].

## 2018-12-07 ENCOUNTER — Other Ambulatory Visit: Payer: Self-pay

## 2018-12-07 ENCOUNTER — Ambulatory Visit (INDEPENDENT_AMBULATORY_CARE_PROVIDER_SITE_OTHER): Payer: Medicare Other | Admitting: Adult Health

## 2018-12-07 DIAGNOSIS — F0280 Dementia in other diseases classified elsewhere without behavioral disturbance: Secondary | ICD-10-CM | POA: Diagnosis not present

## 2018-12-07 DIAGNOSIS — G309 Alzheimer's disease, unspecified: Secondary | ICD-10-CM | POA: Diagnosis not present

## 2018-12-07 NOTE — Progress Notes (Signed)
  [facility] [address] [place]. [place] [address] [phone]     Virtual Visit via Telephone Note  I connected with [name] on 12/07/18 at  1:00 PM EDT by telephone located remotely at [facility] and verified that I am speaking with the correct person using two identifiers who reports being located at home   Visit scheduled by Ward Givens. I discussed the limitations, risks, security and privacy concerns of performing an evaluation and management service by telephone and the availability of in person appointments. I also discussed with the patient that there may be a patient responsible charge related to this service. The patient expressed understanding and agreed to proceed.   The patient was scheduled for video visit but the camera was not working properly.   History of Present Illness:  [name] is a 84 y.o. female who has been followed in this office for Alzheimer's disease.  She was initially scheduled for face-to-face office follow up visit today time but due to [place], visit rescheduled for non-face-to-face telephone visit with patients consent. Unable to participate in video visit due to lack of access to device with camera.    Today 12/07/18  [name] is an 84 year old female with a history of Alzheimer's disease.  I spoke with her husband today.  He feels that her memory might be a little worse.  He states that their daughter is now helping them during the day.  The patient does require assistance with ADLs.  She does not cook any longer.  No significant changes with her mood or behavior.  She continues on Namzaric.    Observations/Objective:  Generalized: Well developed, in no acute distress   Neurological examination  Mentation: Alert oriented to person.  speech and language fluent  Assessment and Plan:  1.  Alzheimer's disease  We were unable to do a memory test since this was a telephone visit.  The patient  will continue on Namzaric.  There are no new concerning issues today.  I have advised the patient and her husband that if her symptoms worsen or she develops new symptoms she should let us know.  She will follow-up in 6 months or sooner if needed.  Follow Up Instructions:    Follow-up in 6 months   I discussed the assessment and treatment plan with the patient.  The patient was provided an opportunity to ask questions and all were answered to their satisfaction. The patient agreed with the plan and verbalized an understanding of the instructions.   I provided 15 minutes of non-face-to-face time during this encounter.   Ward Givens, MSN, NP-C 12/07/2018, 1:19 PM [facility] [address], [place], [place] [address] [phone]

## 2018-12-16 NOTE — Telephone Encounter (Signed)
No change. I do not need to be notified of NSVT.

## 2019-01-11 ENCOUNTER — Ambulatory Visit: Payer: Medicare Other | Admitting: Podiatry

## 2019-01-11 ENCOUNTER — Other Ambulatory Visit: Payer: Self-pay

## 2019-01-11 ENCOUNTER — Encounter: Payer: Self-pay | Admitting: Podiatry

## 2019-01-11 DIAGNOSIS — M79675 Pain in left toe(s): Secondary | ICD-10-CM

## 2019-01-11 DIAGNOSIS — B351 Tinea unguium: Secondary | ICD-10-CM | POA: Diagnosis not present

## 2019-01-11 DIAGNOSIS — M79674 Pain in right toe(s): Secondary | ICD-10-CM

## 2019-01-11 NOTE — Patient Instructions (Signed)

## 2019-01-15 NOTE — Progress Notes (Signed)
Subjective:  [name] presents to clinic today with cc of  painful, thick, discolored, elongated toenails 1-5 b/l that become tender and cannot cut because of thickness.  Pain is aggravated when wearing enclosed shoe gear.  Elby Showers, MD is her PCP.   Current Outpatient Medications:  .  alendronate (FOSAMAX) 70 MG tablet, Take 75 mg by mouth once a week. Take with a full glass of water on an empty stomach. , Disp: , Rfl:  .  apixaban (ELIQUIS) 2.5 MG TABS tablet, Take 1 tablet (2.5 mg total) by mouth 2 (two) times daily., Disp: 60 tablet, Rfl: 11 .  BISACODYL 5 MG EC tablet, , Disp: , Rfl:  .  Calcium Carbonate (CALCIUM-CARB 600 PO), Take 1 tablet by mouth daily. , Disp: , Rfl:  .  cholecalciferol (VITAMIN D) 1000 units tablet, Take 1,000 Units by mouth daily., Disp: , Rfl:  .  fluticasone (FLONASE) 50 MCG/ACT nasal spray, Place 2 sprays into both nostrils daily., Disp: , Rfl:  .  levothyroxine (SYNTHROID, LEVOTHROID) 75 MCG tablet, Take 75 mcg by mouth daily before breakfast. , Disp: , Rfl:  .  LORazepam (ATIVAN) 0.5 MG tablet, TAKE 1 TABLET BY MOUTH TWICE DAILY AS NEEDED FOR ANXIETY, Disp: 60 tablet, Rfl: 5 .  Melatonin 5 MG TABS, Take 5 mg by mouth 3 times/day as needed-between meals & bedtime. , Disp: , Rfl:  .  NAMZARIC 28-10 MG CP24, TAKE 1 CAPSULE BY MOUTH AT BEDTIME, Disp: 90 capsule, Rfl: 3 .  vitamin B-12 (CYANOCOBALAMIN) 500 MCG tablet, Take 500 mcg by mouth daily. , Disp: , Rfl:    Allergies  Allergen Reactions  . Sulfa Antibiotics Diarrhea and Rash  . Sulfasalazine Diarrhea and Rash     Objective: There were no vitals filed for this visit.  Physical Examination:  Vascular Examination: Capillary refill time <3 seconds x 10 digits.  DP/PT pulses faintly b/l.  Digital hair absent b/l.  No edema noted b/l.  Skin temperature gradient WNL b/l.  Dermatological Examination: Skin with normal turgor, texture and tone b/l.  No open wounds b/l.  No interdigital  macerations noted b/l.  Elongated, thick, discolored brittle toenails with subungual debris and pain on dorsal palpation of nailbeds 1-5 b/l.  Musculoskeletal Examination: Muscle strength 5/5 to all muscle groups b/l  No pain, crepitus or joint discomfort with active/passive ROM.  Neurological Examination: Sensation intact 5/5 b/l with 10 gram monofilament.  Assessment: Mycotic nail infection with pain 1-5 b/l  Plan: 1. Toenails 1-5 b/l were debrided in length and girth without iatrogenic laceration. 2.  Continue soft, supportive shoe gear daily. 3.  Report any pedal injuries to medical professional. 4.  Follow up 3 months. 5.  Patient/POA to call should there be a question/concern in there interim.

## 2019-02-22 ENCOUNTER — Ambulatory Visit (INDEPENDENT_AMBULATORY_CARE_PROVIDER_SITE_OTHER): Payer: Medicare Other | Admitting: *Deleted

## 2019-02-22 DIAGNOSIS — I442 Atrioventricular block, complete: Secondary | ICD-10-CM

## 2019-02-23 LAB — CUP PACEART REMOTE DEVICE CHECK
Battery Remaining Longevity: 103 mo
Battery Remaining Percentage: 95.5 %
Battery Voltage: 2.96 V
Brady Statistic AP VP Percent: 23 %
Brady Statistic AP VS Percent: 2.6 %
Brady Statistic AS VP Percent: 27 %
Brady Statistic AS VS Percent: 46 %
Brady Statistic RA Percent Paced: 25 %
Brady Statistic RV Percent Paced: 50 %
Date Time Interrogation Session: 20200902060836
Implantable Lead Implant Date: 20140613
Implantable Lead Implant Date: 20140613
Implantable Lead Location: 753859
Implantable Lead Location: 753860
Implantable Pulse Generator Implant Date: 20140613
Lead Channel Impedance Value: 330 Ohm
Lead Channel Impedance Value: 380 Ohm
Lead Channel Pacing Threshold Amplitude: 0.5 V
Lead Channel Pacing Threshold Amplitude: 1 V
Lead Channel Pacing Threshold Pulse Width: 0.5 ms
Lead Channel Pacing Threshold Pulse Width: 0.5 ms
Lead Channel Sensing Intrinsic Amplitude: 12 mV
Lead Channel Sensing Intrinsic Amplitude: 2.2 mV
Lead Channel Setting Pacing Amplitude: 2 V
Lead Channel Setting Pacing Amplitude: 2.5 V
Lead Channel Setting Pacing Pulse Width: 0.5 ms
Lead Channel Setting Sensing Sensitivity: 2 mV
Pulse Gen Model: 2210
Pulse Gen Serial Number: 7474456

## 2019-03-09 ENCOUNTER — Other Ambulatory Visit: Payer: Medicare Other | Admitting: Internal Medicine

## 2019-03-09 ENCOUNTER — Encounter: Payer: Self-pay | Admitting: Cardiology

## 2019-03-09 ENCOUNTER — Other Ambulatory Visit: Payer: Self-pay

## 2019-03-09 DIAGNOSIS — E039 Hypothyroidism, unspecified: Secondary | ICD-10-CM

## 2019-03-09 DIAGNOSIS — E785 Hyperlipidemia, unspecified: Secondary | ICD-10-CM

## 2019-03-09 DIAGNOSIS — Z0000 Encounter for general adult medical examination without abnormal findings: Secondary | ICD-10-CM

## 2019-03-09 DIAGNOSIS — R413 Other amnesia: Secondary | ICD-10-CM

## 2019-03-09 DIAGNOSIS — Z950 Presence of cardiac pacemaker: Secondary | ICD-10-CM

## 2019-03-09 DIAGNOSIS — Z7901 Long term (current) use of anticoagulants: Secondary | ICD-10-CM

## 2019-03-09 LAB — COMPLETE METABOLIC PANEL WITH GFR
AG Ratio: 1.6 (calc) (ref 1.0–2.5)
ALT: 10 U/L (ref 6–29)
AST: 18 U/L (ref 10–35)
Albumin: 4.3 g/dL (ref 3.6–5.1)
Alkaline phosphatase (APISO): 65 U/L (ref 37–153)
BUN/Creatinine Ratio: 20 (calc) (ref 6–22)
BUN: 20 mg/dL (ref 7–25)
CO2: 29 mmol/L (ref 20–32)
Calcium: 9.5 mg/dL (ref 8.6–10.4)
Chloride: 102 mmol/L (ref 98–110)
Creat: 0.98 mg/dL — ABNORMAL HIGH (ref 0.60–0.88)
GFR, Est African American: 62 mL/min/{1.73_m2} (ref >=60)
GFR, Est Non African American: 54 mL/min/{1.73_m2} — ABNORMAL LOW (ref >=60)
Globulin: 2.7 g/dL (calc) (ref 1.9–3.7)
Glucose, Bld: 88 mg/dL (ref 65–99)
Potassium: 3.9 mmol/L (ref 3.5–5.3)
Sodium: 142 mmol/L (ref 135–146)
Total Bilirubin: 0.6 mg/dL (ref 0.2–1.2)
Total Protein: 7 g/dL (ref 6.1–8.1)

## 2019-03-09 LAB — CBC WITH DIFFERENTIAL/PLATELET
Absolute Monocytes: 621 cells/uL (ref 200–950)
Basophils Absolute: 38 cells/uL (ref 0–200)
Basophils Relative: 0.6 %
Eosinophils Absolute: 211 cells/uL (ref 15–500)
Eosinophils Relative: 3.3 %
HCT: 44.2 % (ref 35.0–45.0)
Hemoglobin: 15 g/dL (ref 11.7–15.5)
Lymphs Abs: 1453 cells/uL (ref 850–3900)
MCH: 31.4 pg (ref 27.0–33.0)
MCHC: 33.9 g/dL (ref 32.0–36.0)
MCV: 92.7 fL (ref 80.0–100.0)
MPV: 8.9 fL (ref 7.5–12.5)
Monocytes Relative: 9.7 %
Neutro Abs: 4077 cells/uL (ref 1500–7800)
Neutrophils Relative %: 63.7 %
Platelets: 249 10*3/uL (ref 140–400)
RBC: 4.77 10*6/uL (ref 3.80–5.10)
RDW: 12.4 % (ref 11.0–15.0)
Total Lymphocyte: 22.7 %
WBC: 6.4 10*3/uL (ref 3.8–10.8)

## 2019-03-09 LAB — LIPID PANEL
Cholesterol: 269 mg/dL — ABNORMAL HIGH (ref <200)
HDL: 79 mg/dL (ref >=50)
LDL Cholesterol (Calc): 168 mg/dL (calc) — ABNORMAL HIGH
Non-HDL Cholesterol (Calc): 190 mg/dL (calc) — ABNORMAL HIGH (ref <130)
Total CHOL/HDL Ratio: 3.4 (calc) (ref <5.0)
Triglycerides: 101 mg/dL (ref <150)

## 2019-03-09 LAB — TSH: TSH: 1.77 mIU/L (ref 0.40–4.50)

## 2019-03-09 NOTE — Progress Notes (Signed)
Remote pacemaker transmission.   

## 2019-03-16 ENCOUNTER — Encounter: Payer: Self-pay | Admitting: Internal Medicine

## 2019-03-16 ENCOUNTER — Ambulatory Visit (INDEPENDENT_AMBULATORY_CARE_PROVIDER_SITE_OTHER): Payer: Medicare Other | Admitting: Internal Medicine

## 2019-03-16 ENCOUNTER — Other Ambulatory Visit: Payer: Self-pay

## 2019-03-16 VITALS — BP 120/70 | HR 66 | Temp 98.3°F | Ht 66.5 in | Wt 130.0 lb

## 2019-03-16 DIAGNOSIS — Z950 Presence of cardiac pacemaker: Secondary | ICD-10-CM

## 2019-03-16 DIAGNOSIS — Z7901 Long term (current) use of anticoagulants: Secondary | ICD-10-CM | POA: Diagnosis not present

## 2019-03-16 DIAGNOSIS — R413 Other amnesia: Secondary | ICD-10-CM | POA: Diagnosis not present

## 2019-03-16 DIAGNOSIS — Z0000 Encounter for general adult medical examination without abnormal findings: Secondary | ICD-10-CM | POA: Diagnosis not present

## 2019-03-16 DIAGNOSIS — Z8679 Personal history of other diseases of the circulatory system: Secondary | ICD-10-CM

## 2019-03-16 DIAGNOSIS — Z8781 Personal history of (healed) traumatic fracture: Secondary | ICD-10-CM

## 2019-03-16 DIAGNOSIS — Z8673 Personal history of transient ischemic attack (TIA), and cerebral infarction without residual deficits: Secondary | ICD-10-CM | POA: Diagnosis not present

## 2019-03-16 DIAGNOSIS — E039 Hypothyroidism, unspecified: Secondary | ICD-10-CM | POA: Diagnosis not present

## 2019-03-16 DIAGNOSIS — F419 Anxiety disorder, unspecified: Secondary | ICD-10-CM

## 2019-03-16 LAB — POCT URINALYSIS DIPSTICK
Appearance: NEGATIVE
Bilirubin, UA: NEGATIVE
Blood, UA: NEGATIVE
Glucose, UA: NEGATIVE
Ketones, UA: NEGATIVE
Leukocytes, UA: NEGATIVE
Nitrite, UA: NEGATIVE
Odor: NEGATIVE
Protein, UA: NEGATIVE
Spec Grav, UA: 1.015 (ref 1.010–1.025)
Urobilinogen, UA: 0.2 E.U./dL
pH, UA: 6 (ref 5.0–8.0)

## 2019-03-19 NOTE — Progress Notes (Signed)
Subjective:    Patient ID: [name], female    DOB: 1934/06/25, 84 y.o.   MRN: [id number]  HPI 84 year old Female in today accompanied by her husband for Medicare wellness exam, health maintenance exam and evaluation of medical issues.  She has memory loss and is on Namzaric.  History of chronic anticoagulation for paroxysmal atrial fibrillation.  She has a pacemaker.  She is followed by Drs. Lovena Le and [employer].  History of right hip fracture 2016.  Required right hip arthroplasty for acute right transverse femoral neck fracture at that time.  [facility]  She is on Fosamax.  Weight in 2015 was 135 pounds.,  Weight was 150 pounds in August 2018, 136 pounds in May 2019, 134 pounds in September 2019, 126 pounds December 2019, 130 pounds January 2020, 130 pounds in April 2020.  Weight is stable at 130 pounds today.  She and her husband have been getting along pretty well during the pandemic.  There is a caregiver that helps some.  They used to eat lunch out every day but no longer get to do this.  They enjoyed it very much but they do get takeout.  Old records indicates she has a history of right ovarian cystectomy and appendectomy 1965 for endometriosis.  Had tumor removed from her abdomen in 1985.  Had polyp removed from cervical office 1996.  Apparently there was a question of a right pelvic mass in 2014 was seen by GYN and was complaining of right pelvic pain but ultrasound did not show presence of right ovary or mass.  Longstanding history of urinary frequency and nocturia.  History of borderline hypothyroidism and is seen Dr. Michiel Sites in the past but currently is not on thyroid replacement.  Her husband brought her memory issues to my attention in 2014.  History of dizziness and left-sided weakness in 2011 thought possibly to have had a TIA.  MRI showed chronic microvascular ischemia.  She nor her husband want her to have a mammogram or any colon cancer  screening.  She gets annual flu vaccine.  Had colonoscopy May 2013 by Dr. Carlean Purl showing a hyperplastic polyp.  In 2007 she had colonoscopy by Dr. Collene Mares and had 2 tubular adenomas removed.  She is retired Chief Technology Officer.  Husband is a retired Teaching laboratory technician.  Patient taught second and third grade for 30 years.  Rare alcohol consumption.  Non-smoker.  3 children.  1 son and 1 daughter are adopted.  They have 1 daughter who is their natural child.  In 1997 patient was diagnosed by Dr. Einar Crow away for depression and was on Paxil for about a year.  In 2001 she had reconstruction of left thumb CMC joint by Dr. Suszanne Conners.  Family history: Father died from colon cancer at age 47.  Mother died from colon blockage after surgery at age 26.  Brother with history of coronary artery disease with stent placement.    Review of Systems she is pleasant and cooperative and has no complaints today     Objective:   Physical Exam Blood pressure 120/70 pulse 66, weight 130 pounds height 5 feet 6.5 inches BMI 20.67.  Skin warm and dry.  Nodes none.  TMs and pharynx are clear.  Neck is supple.  No JVD thyromegaly or carotid bruits.  Chest is clear.  Cardiac exam regular rate and rhythm.  Abdomen soft nondistended without hepatosplenomegaly masses or tenderness.  Breast without masses.  No lower extremity edema.  She does not know me by my name, day of week, month or year.       Assessment & Plan:  Memory loss followed by neurology and is on Namzaric  Complete heart block and has pacemaker  Chronic anticoagulation for paroxysmal atrial fibrillation  Weight has been an issue but currently is stable at 130 pounds.  Husband says appetite is sufficient.  Anxiety-has lorazepam on hand if needed  History of fracture-is on Fosamax and seems to tolerate it fine  Hypothyroidism treated with thyroid replacement medication and TSH is stable at 1.77  Her cholesterol was 269 and has gone up from  September when it was 232.  LDL cholesterol is gone up from 1 38-1 68.  I do not think her husband wants to be aggressive with her cholesterol control at this point.  It is likely due to the pandemic and was better previously.  Plan: Continue current medications and follow-up in 1 year or as needed.  Husband is supportive.  Subjective:   Patient presents for Medicare Annual/Subsequent preventive examination.  Review Past Medical/Family/Social:   Risk Factors  Current exercise habits:  Dietary issues discussed:   Cardiac risk factors:  Depression Screen  (Note: if answer to either of the following is "Yes", a more complete depression screening is indicated)   Over the past two weeks, have you felt down, depressed or hopeless? No  Over the past two weeks, have you felt little interest or pleasure in doing things? No Have you lost interest or pleasure in daily life? No Do you often feel hopeless? No Do you cry easily over simple problems? No   Activities of Daily Living  In your present state of health, do you have any difficulty performing the following activities?:   Driving? No longer drives Managing money? No longer manages money Feeding yourself?  Yes has to be encouraged Getting from bed to chair? No  Climbing a flight of stairs? No  Preparing food and eating?: No  Bathing or showering?  Yes needs assistance Getting dressed: No  Getting to the toilet? No  Using the toilet:No  Moving around from place to place: No  In the past year have you fallen or had a near fall?:No  Are you sexually active? No  Do you have more than one partner? No   Hearing Difficulties:  Do you often ask people to speak up or repeat themselves?  Yes Do you experience ringing or noises in your ears?  Yes Do you have difficulty understanding soft or whispered voices?  Yes Do you feel that you have a problem with memory?  Yes-history of memory disorder Do you often misplace items?  Yes   Home  Safety:  Do you have a smoke alarm at your residence? Yes Do you have grab bars in the bathroom?  Yes Do you have throw rugs in your house?  Yes   Cognitive Testing  Alert? Yes Normal Appearance?Yes  Oriented to person? Yes Place?  No Time?  No Recall of three objects?  No Can perform simple calculations?  None Displays appropriate judgment?  No Can read the correct time from a watch face?  No  List the Names of Other Physician/Practitioners you currently use:  See referral list for the physicians patient is currently seeing.     Review of Systems: No new complaints   Objective:     General appearance: Appears stated age and thin Head: Normocephalic, without obvious abnormality, atraumatic  Eyes: conj clear, EOMi  PEERLA  Ears: normal TM's and external ear canals both ears  Nose: Nares normal. Septum midline. Mucosa normal. No drainage or sinus tenderness.  Throat: lips, mucosa, and tongue normal; teeth and gums normal  Neck: no adenopathy, no carotid bruit, no JVD, supple, symmetrical, trachea midline and thyroid not enlarged, symmetric, no tenderness/mass/nodules  No CVA tenderness.  Lungs: clear to auscultation bilaterally  Breasts: normal appearance, no masses or tenderness Heart: regular rate and rhythm, S1, S2 normal, no murmur, click, rub or gallop  Abdomen: soft, non-tender; bowel sounds normal; no masses, no organomegaly  Musculoskeletal: ROM normal in all joints, no crepitus, no deformity, Normal muscle strengthen. Back  is symmetric, no curvature. Skin: Skin color, texture, turgor normal. No rashes or lesions  Lymph nodes: Cervical, supraclavicular, and axillary nodes normal.  Neurologic: CN 2 -12 Normal, Normal symmetric reflexes. Normal coordination and gait  Psych: Alert but not oriented to place or day of week or month or year. Mood appear stable.    Assessment:    Annual wellness medicare exam   Plan:    During the course of the visit the patient was  educated and counseled about appropriate screening and preventive services including:   Husband declines mammogram and colonoscopy  Will take flu vaccine     Patient Instructions (the written plan) was given to the patient.  Medicare Attestation  I have personally reviewed:  The patient's medical and social history  Their use of alcohol, tobacco or illicit drugs  Their current medications and supplements  The patient's functional ability including ADLs,fall risks, home safety risks, cognitive, and hearing and visual impairment  Diet and physical activities  Evidence for depression or mood disorders  The patient's weight, height, BMI, and visual acuity have been recorded in the chart. I have made referrals, counseling, and provided education to the patient based on review of the above and I have provided the patient with a written personalized care plan for preventive services.

## 2019-03-19 NOTE — Patient Instructions (Addendum)
It was a pleasure to see you today.   Return in 1 year or as needed.  Husband to monitor weight at home.  Continue current medications.

## 2019-03-20 ENCOUNTER — Other Ambulatory Visit: Payer: Self-pay

## 2019-03-20 ENCOUNTER — Ambulatory Visit (INDEPENDENT_AMBULATORY_CARE_PROVIDER_SITE_OTHER): Payer: Medicare Other | Admitting: Podiatry

## 2019-03-20 ENCOUNTER — Encounter: Payer: Self-pay | Admitting: Podiatry

## 2019-03-20 DIAGNOSIS — M79674 Pain in right toe(s): Secondary | ICD-10-CM

## 2019-03-20 DIAGNOSIS — M79675 Pain in left toe(s): Secondary | ICD-10-CM | POA: Diagnosis not present

## 2019-03-20 DIAGNOSIS — B351 Tinea unguium: Secondary | ICD-10-CM

## 2019-03-20 NOTE — Patient Instructions (Signed)

## 2019-03-22 NOTE — Progress Notes (Signed)
Subjective: [name] is seen today for follow up painful, elongated, thickened toenails 1-5 b/l feet that she cannot cut. Pain interferes with daily activities. Aggravating factor includes wearing enclosed shoe gear and relieved with periodic debridement.  Current Outpatient Medications on File Prior to Visit  Medication Sig  . alendronate (FOSAMAX) 70 MG tablet Take 75 mg by mouth once a week. Take with a full glass of water on an empty stomach.   Marland Kitchen apixaban (ELIQUIS) 2.5 MG TABS tablet Take 1 tablet (2.5 mg total) by mouth 2 (two) times daily.  Marland Kitchen BISACODYL 5 MG EC tablet   . Calcium Carbonate (CALCIUM-CARB 600 PO) Take 1 tablet by mouth daily.   . cholecalciferol (VITAMIN D) 1000 units tablet Take 1,000 Units by mouth daily.  . fluticasone (FLONASE) 50 MCG/ACT nasal spray Place 2 sprays into both nostrils daily.  Marland Kitchen levothyroxine (SYNTHROID, LEVOTHROID) 75 MCG tablet Take 75 mcg by mouth daily before breakfast.   . LORazepam (ATIVAN) 0.5 MG tablet TAKE 1 TABLET BY MOUTH TWICE DAILY AS NEEDED FOR ANXIETY  . Melatonin 5 MG TABS Take 5 mg by mouth 3 times/day as needed-between meals & bedtime.   Marland Kitchen NAMZARIC 28-10 MG CP24 TAKE 1 CAPSULE BY MOUTH AT BEDTIME  . vitamin B-12 (CYANOCOBALAMIN) 500 MCG tablet Take 500 mcg by mouth daily.    No current facility-administered medications on file prior to visit.      Allergies  Allergen Reactions  . Sulfa Antibiotics Diarrhea and Rash  . Sulfasalazine Diarrhea and Rash     Objective:  Vascular Examination: Capillary refill time less than 3 seconds to 10 digits.  Dorsalis pedis and posterior tibial pulses are faintly palpable bilaterally.  Digital hair absent bilaterally.  Skin temperature gradient WNL b/l.   Dermatological Examination: Skin with normal turgor, texture and tone b/l.  Toenails 1-5 b/l discolored, thick, dystrophic with subungual debris and pain with palpation to nailbeds due to thickness of  nails.  Musculoskeletal: Muscle strength 5/5 bilaterally to all LE muscle groups.  No gross bony deformities b/l.  No pain, crepitus or joint limitation noted with ROM.   Neurological Examination: Protective sensation intact 5/5 bilaterally with 10 gram monofilament.  Epicritic sensation present bilaterally.  Vibratory sensation intact bilaterally.   Assessment: Painful onychomycosis toenails 1-5 b/l   Plan: 1. Toenails 1-5 b/l were debrided in length and girth without iatrogenic bleeding. 2. Patient to continue soft, supportive shoe gear 3. Patient to report any pedal injuries to medical professional immediately. 4. Follow up 3 months.  5. Patient/POA to call should there be a concern in the interim.

## 2019-03-23 ENCOUNTER — Other Ambulatory Visit: Payer: Self-pay | Admitting: Internal Medicine

## 2019-03-23 MED ORDER — LORAZEPAM 0.5 MG PO TABS: 0.5000 mg | ORAL_TABLET | Freq: Two times a day (BID) | ORAL | 5 refills | Status: DC | PRN

## 2019-03-23 NOTE — Addendum Note (Signed)
Addended by: Mady Haagensen on: 03/23/2019 10:32 AM   Modules accepted: Orders

## 2019-03-23 NOTE — Telephone Encounter (Signed)
Fax received from pharmacy [facility] Lorazepam 0.5mg  tablets Last filled: 02/21/19 Last ov: 03/16/19 Last cpe: 03/16/19 Next cpe due: 03/16/20

## 2019-04-10 ENCOUNTER — Other Ambulatory Visit: Payer: Self-pay | Admitting: Cardiovascular Disease

## 2019-04-10 NOTE — Telephone Encounter (Signed)
Eliquis 2.5mg  refill request received, pt is 84yrs old, weight-59kg, Crea-0.98 on 03/09/2019, Diagnosis-Afib, and last seen by Dr. Acie Fredrickson on 11/15/2018 via Telemedicine. Dose is appropriate based on dosing criteria. Will send in refill to requested pharmacy.

## 2019-04-11 ENCOUNTER — Ambulatory Visit: Payer: Medicare Other | Admitting: Podiatry

## 2019-05-16 ENCOUNTER — Encounter (INDEPENDENT_AMBULATORY_CARE_PROVIDER_SITE_OTHER): Payer: Self-pay

## 2019-05-16 ENCOUNTER — Ambulatory Visit: Payer: Medicare Other | Admitting: Cardiovascular Disease

## 2019-05-16 ENCOUNTER — Encounter: Payer: Self-pay | Admitting: Cardiovascular Disease

## 2019-05-16 ENCOUNTER — Other Ambulatory Visit: Payer: Self-pay

## 2019-05-16 VITALS — BP 94/60 | HR 91 | Ht 69.0 in | Wt 130.4 lb

## 2019-05-16 DIAGNOSIS — I442 Atrioventricular block, complete: Secondary | ICD-10-CM | POA: Diagnosis not present

## 2019-05-16 DIAGNOSIS — I480 Paroxysmal atrial fibrillation: Secondary | ICD-10-CM

## 2019-05-16 NOTE — Patient Instructions (Signed)
Medication Instructions:  Your provider recommends that you continue on your current medications as directed. Please refer to the Current Medication list given to you today.   *If you need a refill on your cardiac medications before your next appointment, please call your pharmacy*  Follow-Up: At [facility], you and your health needs are our priority.  As part of our continuing mission to provide you with exceptional heart care, we have created designated Provider Care Teams.  These Care Teams include your primary Cardiologist (physician) and Advanced Practice Providers (APPs -  Physician Assistants and Nurse Practitioners) who all work together to provide you with the care you need, when you need it. Your next appointment:   12 month(s) The format for your next appointment:   In Person Provider:   You may see Mertie Moores, MD or one of the following Advanced Practice Providers on your designated Care Team:    Richardson Dopp, PA-C  [place], [place]  Daune Perch, [place]

## 2019-05-16 NOTE — Progress Notes (Signed)
Cardiology Office Note   Date:  05/16/2019   ID:  [name], DOB 02/13/1935, MRN [id number]  PCP:  Elby Showers, MD  Cardiologist:  Previous Darlin Coco MD , now    No chief complaint on file.  Problem List 1. Paroxysmal atrial fibrillation 2. Hyperlipidemia 3. 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 Hypothyroidism-      [name] is a 84 y.o. female who presents for a six-month follow-up visit  This 84 year old woman is seen for a scheduled followup office visit. She has a history of intermittent high-grade AV block and has a functioning St. Jude dual-chamber pacemaker. She is followed for this by Dr. Lovena Le. She has a history of hypercholesterolemia. She has a history of osteoarthritis. She has also had some memory issues and is followed by Dr. Brett Fairy. Since last visit she has had no new cardiac symptoms. She has not had any dizziness or syncope. No chest pain. She has had a past history of unintentional weight loss but since her last visit she has had no change in weight. The patient had a chest x-ray on 02/19/14 which showed no abnormality. She complains of feeling tired and worn out. She has had extensive lab work by her PCP which did not show any evidence of malignancy. She had a CT scan of the abdomen and pelvis on 08/08/14 which did not show any abdominal aortic aneurysm or other cause of weight loss. She complains of dyspnea and not being able to get a complete breath. Impression The patient developed a fractured right hip. Her husband and she notes that she never fell. Her hip just started to hurt. X-rays confirmed a fracture and Dr. Rhona Raider operated on her on 01/03/15. She is walking with a cane. Her recent pacemaker interrogation revealed that she was having episodes of paroxysmal atrial fibrillation 1% of the time.  This does put her at increased risk for thromboembolic disease.  January 07, 2016:  Was seen with her husband today .  She has some dementia and husband answered most of the detailed questions .   Was started on Eliquis for PAF by Dr. Mare Ferrari at her last visit Tolerating it well.  Sees Dr. Chalmers Cater for hypothyroidism   [name], [name] She has a history of paroxysmal atrial fib . She has dementia and most of the questions were answered by her husband. Has a slight cough No bleeding with the Eliquis   [name], [name], [name])  for follow-up of her paroxysmal atrial for ablation. Is a history of dementia and most of her questions were answered by her husband.  She has a pacemaker. Had some stomach issues.     May 16, 2019:  [name] is seen today for a follow-up visit. She has a history of atrial fibrillation and also history of complete heart block.  She has a pacemaker.  She has severe dementia and most of the questions were answered by her husband. She is on Eliquis.  She has not had any episodes of bleeding. Has lost   Past Medical History:  Diagnosis Date  . A-fib ([place])   . Allergy   . Arthritis   . Asthma   . Colon polyps   . Complete heart block -intermittent    a. 11/2012 s/p SJM Accent DR RF dc ppm.  . Depression   . Environmental allergies   .  Hyperlipidemia   . Memory retention disorder 03/22/2013  . Osteoarthritis    hands  . Thyroid disease     Past Surgical History:  Procedure Laterality Date  . APPENDECTOMY  1965  . CERVICAL POLYPECTOMY  1996  . COLONOSCOPY  2007   adenomatous polyps  . EYE SURGERY    . HAND SURGERY    . HIP ARTHROPLASTY Right 01/03/2015   Procedure: ARTHROPLASTY BIPOLAR HIP (HEMIARTHROPLASTY);  Surgeon: [name], MD;  Location: [facility];  Service: Orthopedics;  Laterality: Right;  . INSERT / REPLACE / REMOVE PACEMAKER  12/02/2012    Dr Lovena Le  . KNEE SURGERY    . ovarian cystectomy  1965  . PERMANENT PACEMAKER INSERTION N/A 12/02/2012   Procedure: PERMANENT PACEMAKER  INSERTION;  Surgeon: Evans Lance, MD;  Location: [facility] CATH LAB;  Service: Cardiovascular;  Laterality: N/A;  . SALPINGOOPHORECTOMY     right  . tumor removed  1985   abdomen     Current Outpatient Medications  Medication Sig Dispense Refill  . BISACODYL 5 MG EC tablet     . Calcium Carbonate (CALCIUM-CARB 600 PO) Take 1 tablet by mouth daily.     . cholecalciferol (VITAMIN D) 1000 units tablet Take 1,000 Units by mouth daily.    Marland Kitchen ELIQUIS 2.5 MG TABS tablet TAKE 1 TABLET(2.5 MG) BY MOUTH TWICE DAILY 60 tablet 11  . fluticasone (FLONASE) 50 MCG/ACT nasal spray Place 2 sprays into both nostrils daily.    Marland Kitchen levothyroxine (SYNTHROID, LEVOTHROID) 75 MCG tablet Take 75 mcg by mouth daily before breakfast.     . LORazepam (ATIVAN) 0.5 MG tablet Take 1 tablet (0.5 mg total) by mouth 2 (two) times daily as needed. for anxiety 60 tablet 5  . Melatonin 5 MG TABS Take 5 mg by mouth 3 times/day as needed-between meals & bedtime.     Marland Kitchen NAMZARIC 28-10 MG CP24 TAKE 1 CAPSULE BY MOUTH AT BEDTIME 90 capsule 3  . vitamin B-12 (CYANOCOBALAMIN) 500 MCG tablet Take 500 mcg by mouth daily.      No current facility-administered medications for this visit.     Allergies:   Sulfa antibiotics and Sulfasalazine    Social History:  The patient  reports that she has never smoked. She has never used smokeless tobacco. She reports current alcohol use. She reports that she does not use drugs.   Family History:  The patient's family history includes Colon cancer in her father; Colon polyps in her brother; Coronary artery disease in her brother; Diabetes in her brother.    ROS:  Please see the history of present illness.   Otherwise, review of systems are positive for none.   All other systems are reviewed and negative.     Physical Exam: Blood pressure 94/60, pulse 91, height 5\' 9"  (1.753 m), weight 130 lb 6.4 oz (59.1 kg), SpO2 (!) 89 %.  GEN:  Well nourished, well developed in no acute distress HEENT:  Normal NECK: No JVD; No carotid bruits LYMPHATICS: No lymphadenopathy CARDIAC: RRR , no murmurs, rubs, gallops RESPIRATORY:  Clear to auscultation without rales, wheezing or rhonchi  ABDOMEN: Soft, non-tender, non-distended MUSCULOSKELETAL:  No edema; No deformity  SKIN: Warm and dry NEUROLOGIC:  Alert and oriented x 3   EKG:    May 16, 2019: Normal sinus rhythm at 91.  Left axis deviation.  Poor R wave progression-likely due to lead placement.  T wave inversions in the inferior and anterior leads.  These T wave inversions  are new compared to her last year's EKG.?  Due to coronary ischemia or due to post pacing T wave inversions.  Recent Labs: 03/09/2019: ALT 10; BUN 20; Creat 0.98; Hemoglobin 15.0; Platelets 249; Potassium 3.9; Sodium 142; TSH 1.77    Lipid Panel    Component Value Date/Time   CHOL 269 (H) 03/09/2019 0907   TRIG 101 03/09/2019 0907   HDL 79 03/09/2019 0907   CHOLHDL 3.4 03/09/2019 0907   VLDL 20 02/04/2017 0937   LDLCALC 168 (H) 03/09/2019 0907   LDLDIRECT 127.6 04/21/2013 1127      Wt Readings from Last 3 Encounters:  05/16/19 130 lb 6.4 oz (59.1 kg)  03/16/19 130 lb (59 kg)  11/15/18 130 lb (59 kg)       ASSESSMENT AND PLAN:  1. Functioning St. Jude dual-chamber pacemaker  2. memory disorder:   Has severe and progressive dementia   3. Hypercholesterolemia:  Cont meds.   4. Hypothyroidism : followed by Dr. Chalmers Cater    5.  Paroxysmal atrial fibrillation. This patients CHA2DS2-VASc Score is 3 for age and female sex. Above score calculated as 1 point each if present [CHF, HTN, DM, Vascular=MI/PAD/Aortic Plaque, Age if 65-74, or Female] Above score calculated as 2 points each if present [Age > 75, or Stroke/TIA/TE]  6.  ECG changes:   Has TWI in the ant. And inf leads.  ? Due to post pacing changes vs. Ischemia .   She is a very poor candidate for cath or PCI .   Will not evaluate this TWI any further .    Current medicines are reviewed at  length with the patient today.  The patient does not have concerns regarding medicines.  The following changes have been made:  We discussed the risks and benefits of anticoagulation.  She does not have any history of blood loss anemia.  We will start her on Apixaban 5 mg twice a day.  This is the proper dose for her.  She weighs 66 kg and age is less than 54.  Renal function has been normal in the past.  Follow up in 6 months with Vin .   Mertie Moores, MD  05/16/2019 11:36 AM    [address],  [place] [place], [place]  [address] Pager [phone] Phone: [phone]; Fax: [phone]

## 2019-05-24 ENCOUNTER — Ambulatory Visit (INDEPENDENT_AMBULATORY_CARE_PROVIDER_SITE_OTHER): Payer: Medicare Other | Admitting: *Deleted

## 2019-05-24 DIAGNOSIS — I442 Atrioventricular block, complete: Secondary | ICD-10-CM

## 2019-05-24 LAB — CUP PACEART REMOTE DEVICE CHECK
Battery Remaining Longevity: 95 mo
Battery Remaining Percentage: 91 %
Battery Voltage: 2.95 V
Brady Statistic AP VP Percent: 23 %
Brady Statistic AP VS Percent: 2.5 %
Brady Statistic AS VP Percent: 30 %
Brady Statistic AS VS Percent: 43 %
Brady Statistic RA Percent Paced: 25 %
Brady Statistic RV Percent Paced: 53 %
Date Time Interrogation Session: 20201202073806
Implantable Lead Implant Date: 20140613
Implantable Lead Implant Date: 20140613
Implantable Lead Location: 753859
Implantable Lead Location: 753860
Implantable Pulse Generator Implant Date: 20140613
Lead Channel Impedance Value: 330 Ohm
Lead Channel Impedance Value: 400 Ohm
Lead Channel Pacing Threshold Amplitude: 0.5 V
Lead Channel Pacing Threshold Amplitude: 1 V
Lead Channel Pacing Threshold Pulse Width: 0.5 ms
Lead Channel Pacing Threshold Pulse Width: 0.5 ms
Lead Channel Sensing Intrinsic Amplitude: 12 mV
Lead Channel Sensing Intrinsic Amplitude: 3.1 mV
Lead Channel Setting Pacing Amplitude: 2 V
Lead Channel Setting Pacing Amplitude: 2.5 V
Lead Channel Setting Pacing Pulse Width: 0.5 ms
Lead Channel Setting Sensing Sensitivity: 2 mV
Pulse Gen Model: 2210
Pulse Gen Serial Number: 7474456

## 2019-06-06 ENCOUNTER — Encounter: Payer: Self-pay | Admitting: Podiatry

## 2019-06-06 ENCOUNTER — Other Ambulatory Visit: Payer: Self-pay

## 2019-06-06 ENCOUNTER — Ambulatory Visit: Payer: Medicare Other | Admitting: Podiatry

## 2019-06-06 DIAGNOSIS — M79674 Pain in right toe(s): Secondary | ICD-10-CM | POA: Diagnosis not present

## 2019-06-06 DIAGNOSIS — B351 Tinea unguium: Secondary | ICD-10-CM | POA: Diagnosis not present

## 2019-06-06 DIAGNOSIS — M79675 Pain in left toe(s): Secondary | ICD-10-CM

## 2019-06-08 ENCOUNTER — Other Ambulatory Visit: Payer: Self-pay

## 2019-06-08 ENCOUNTER — Encounter: Payer: Self-pay | Admitting: Adult Health

## 2019-06-08 ENCOUNTER — Ambulatory Visit: Payer: Medicare Other | Admitting: Adult Health

## 2019-06-08 VITALS — Temp 97.4°F | Ht 69.0 in | Wt 130.6 lb

## 2019-06-08 DIAGNOSIS — F0280 Dementia in other diseases classified elsewhere without behavioral disturbance: Secondary | ICD-10-CM

## 2019-06-08 DIAGNOSIS — G309 Alzheimer's disease, unspecified: Secondary | ICD-10-CM | POA: Diagnosis not present

## 2019-06-08 NOTE — Patient Instructions (Addendum)
Your Plan:  Continue Namzaric If your symptoms worsen or you develop new symptoms please let us know.    Thank you for coming to see us at [facility]. I hope we have been able to provide you high quality care today.  You may receive a patient satisfaction survey over the next few weeks. We would appreciate your feedback and comments so that we may continue to improve ourselves and the health of our patients.  

## 2019-06-08 NOTE — Progress Notes (Signed)
PATIENT: [name] DOB: 10/16/1934  REASON FOR VISIT: follow up HISTORY FROM: patient  HISTORY OF PRESENT ILLNESS: Today 06/08/19:  [name] is an 84 year old female with a history of Alzheimer's disease.  She returns today for follow-up.  She is currently on Namzaric.  She lives at home with her husband.  He feels that her memory has remained the same.  She is able to complete most ADLs independently.  He reports that her daughter does come over and helps her bathe.  She is able to feed herself.  She no longer cooks.  She no longer operates a motor vehicle.  Denies any trouble sleeping.  She returns today for follow-up.  HISTORY 12/07/18  [name] is an 84 year old female with a history of Alzheimer's disease.  I spoke with her husband today.  He feels that her memory might be a little worse.  He states that their daughter is now helping them during the day.  The patient does require assistance with ADLs.  She does not cook any longer.  No significant changes with her mood or behavior.  She continues on Namzaric.  REVIEW OF SYSTEMS: Out of a complete 14 system review of symptoms, the patient complains only of the following symptoms, and all other reviewed systems are negative.  See HPI  ALLERGIES: Allergies  Allergen Reactions  . Sulfa Antibiotics Diarrhea and Rash  . Sulfasalazine Diarrhea and Rash    HOME MEDICATIONS: Outpatient Medications Prior to Visit  Medication Sig Dispense Refill  . Calcium Carbonate (CALCIUM-CARB 600 PO) Take 1 tablet by mouth daily.     . cholecalciferol (VITAMIN D) 1000 units tablet Take 1,000 Units by mouth daily.    Marland Kitchen ELIQUIS 2.5 MG TABS tablet TAKE 1 TABLET(2.5 MG) BY MOUTH TWICE DAILY 60 tablet 11  . fluticasone (FLONASE) 50 MCG/ACT nasal spray Place 2 sprays into both nostrils daily.    Marland Kitchen levothyroxine (SYNTHROID, LEVOTHROID) 75 MCG tablet Take 75 mcg by mouth daily before breakfast.     . LORazepam (ATIVAN) 0.5 MG tablet Take 1 tablet  (0.5 mg total) by mouth 2 (two) times daily as needed. for anxiety 60 tablet 5  . Melatonin 5 MG TABS Take 5 mg by mouth 3 times/day as needed-between meals & bedtime.     . memantine (NAMENDA) 5 MG tablet Take 5 mg by mouth 2 (two) times daily. Husband states she is taking, unsure of MG    . NAMZARIC 28-10 MG CP24 TAKE 1 CAPSULE BY MOUTH AT BEDTIME 90 capsule 3  . vitamin B-12 (CYANOCOBALAMIN) 500 MCG tablet Take 500 mcg by mouth daily.     Marland Kitchen BISACODYL 5 MG EC tablet      No facility-administered medications prior to visit.    PAST MEDICAL HISTORY: Past Medical History:  Diagnosis Date  . A-fib ([place])   . Allergy   . Arthritis   . Asthma   . Colon polyps   . Complete heart block -intermittent    a. 11/2012 s/p SJM Accent DR RF dc ppm.  . Depression   . Environmental allergies   . Hyperlipidemia   . Memory retention disorder 03/22/2013  . Osteoarthritis    hands  . Thyroid disease     PAST SURGICAL HISTORY: Past Surgical History:  Procedure Laterality Date  . APPENDECTOMY  1965  . CERVICAL POLYPECTOMY  1996  . COLONOSCOPY  2007   adenomatous polyps  . EYE SURGERY    . HAND SURGERY    .  HIP ARTHROPLASTY Right 01/03/2015   Procedure: ARTHROPLASTY BIPOLAR HIP (HEMIARTHROPLASTY);  Surgeon: [name], MD;  Location: [facility];  Service: Orthopedics;  Laterality: Right;  . INSERT / REPLACE / REMOVE PACEMAKER  12/02/2012    Dr Lovena Le  . KNEE SURGERY    . ovarian cystectomy  1965  . PERMANENT PACEMAKER INSERTION N/A 12/02/2012   Procedure: PERMANENT PACEMAKER INSERTION;  Surgeon: Evans Lance, MD;  Location: [facility] CATH LAB;  Service: Cardiovascular;  Laterality: N/A;  . SALPINGOOPHORECTOMY     right  . tumor removed  1985   abdomen    FAMILY HISTORY: Family History  Problem Relation Age of Onset  . Colon cancer Father   . Colon polyps Brother   . Diabetes Brother        maternal aunt  . Coronary artery disease Brother     SOCIAL HISTORY: Social History    Socioeconomic History  . Marital status: Married    Spouse name: [name]  . Number of children: 3  . Years of education: College  . Highest education level: Not on file  Occupational History  . Occupation: elem. school teacher    Comment: retired  Tobacco Use  . Smoking status: Never Smoker  . Smokeless tobacco: Never Used  Substance and Sexual Activity  . Alcohol use: Yes    Alcohol/week: 0.0 standard drinks    Comment: occas. which is seldom  . Drug use: No  . Sexual activity: Not Currently  Other Topics Concern  . Not on file  Social History Narrative   Patient has one daughter, two adopted children(son,daughter).   Patient lives at home with spouse [name]).   Patient is retired.   Patient has a college education.   Patient is right-handed.   Patient drinks very little caffeine.   Social Determinants of Health   Financial Resource Strain:   . Difficulty of Paying Living Expenses: Not on file  Food Insecurity:   . Worried About Charity fundraiser in the Last Year: Not on file  . Ran Out of Food in the Last Year: Not on file  Transportation Needs:   . Lack of Transportation (Medical): Not on file  . Lack of Transportation (Non-Medical): Not on file  Physical Activity:   . Days of Exercise per Week: Not on file  . Minutes of Exercise per Session: Not on file  Stress:   . Feeling of Stress : Not on file  Social Connections:   . Frequency of Communication with Friends and Family: Not on file  . Frequency of Social Gatherings with Friends and Family: Not on file  . Attends Religious Services: Not on file  . Active Member of Clubs or Organizations: Not on file  . Attends Archivist Meetings: Not on file  . Marital Status: Not on file  Intimate Partner Violence:   . Fear of Current or Ex-Partner: Not on file  . Emotionally Abused: Not on file  . Physically Abused: Not on file  . Sexually Abused: Not on file      PHYSICAL EXAM  Vitals:    06/08/19 1328  Temp: (!) 97.4 F (36.3 C)  Weight: 130 lb 9.6 oz (59.2 kg)  Height: 5\' 9"  (1.753 m)   Body mass index is 19.29 kg/m.   MMSE - Mini Mental State Exam 06/08/2019 09/20/2017 03/22/2017  Not completed: Unable to complete - -  Orientation to time - 1 0  Orientation to Place - 0 1  Registration -  3 3  Attention/ Calculation - 5 0  Recall - 0 0  Language- name 2 objects - 2 2  Language- repeat - 0 0  Language- follow 3 step command - 3 3  Language- read & follow direction - 1 1  Write a sentence - 1 1  Copy design - 1 1  Total score - 17 12     Generalized: Well developed, in no acute distress   Neurological examination  Mentation: Alert oriented to time, place, history taking. Follows all commands speech and language fluent Cranial nerve II-XII:  Extraocular movements were full, visual field were full on confrontational test.  Head turning and shoulder shrug  were normal and symmetric. Motor: The motor testing reveals 5 over 5 strength of all 4 extremities. Good symmetric motor tone is noted throughout.  Sensory: Sensory testing is intact to soft touch on all 4 extremities. No evidence of extinction is noted.  Coordination: Cerebellar testing reveals good finger-nose-finger and heel-to-shin bilaterally.  Gait and station: Gait is normal.  Tandem gait not attempted   DIAGNOSTIC DATA (LABS, IMAGING, TESTING) - I reviewed patient records, labs, notes, testing and imaging myself where available.  Lab Results  Component Value Date   WBC 6.4 03/09/2019   HGB 15.0 03/09/2019   HCT 44.2 03/09/2019   MCV 92.7 03/09/2019   PLT 249 03/09/2019      Component Value Date/Time   NA 142 03/09/2019 0907   NA 131 (L) 10/21/2017 1014   K 3.9 03/09/2019 0907   CL 102 03/09/2019 0907   CO2 29 03/09/2019 0907   GLUCOSE 88 03/09/2019 0907   BUN 20 03/09/2019 0907   BUN 18 10/21/2017 1014   CREATININE 0.98 (H) 03/09/2019 0907   CALCIUM 9.5 03/09/2019 0907   PROT 7.0  03/09/2019 0907   ALBUMIN 4.2 03/30/2018 1125   AST 18 03/09/2019 0907   ALT 10 03/09/2019 0907   ALKPHOS 49 03/30/2018 1125   BILITOT 0.6 03/09/2019 0907   GFRNONAA 54 (L) 03/09/2019 0907   GFRAA 62 03/09/2019 0907   Lab Results  Component Value Date   CHOL 269 (H) 03/09/2019   HDL 79 03/09/2019   LDLCALC 168 (H) 03/09/2019   LDLDIRECT 127.6 04/21/2013   TRIG 101 03/09/2019   CHOLHDL 3.4 03/09/2019   Lab Results  Component Value Date   HGBA1C 5.9 (H) 03/05/2014   Lab Results  Component Value Date   VITAMINB12 1,048 07/13/2017   Lab Results  Component Value Date   TSH 1.77 03/09/2019      ASSESSMENT AND PLAN 84 y.o. year old female  has a past medical history of A-fib ([place]), Allergy, Arthritis, Asthma, Colon polyps, Complete heart block -intermittent, Depression, Environmental allergies, Hyperlipidemia, Memory retention disorder (03/22/2013), Osteoarthritis, and Thyroid disease. here with:  1.  Alzheimer's disease  Patient was unable to complete the memory test today.  She will continue on Namzaric.  I have advised that if symptoms worsen or she develops new symptoms they should let us know.  She will follow-up in 6 months or sooner if needed.   I spent 15 minutes with the patient. 50% of this time was spent reviewing plan of care   Ward Givens, MSN, NP-C 06/08/2019, 1:41 PM [facility] [address] [place], [place] [address] [phone]

## 2019-06-18 NOTE — Progress Notes (Signed)
Subjective:  [name] presents to clinic today with cc of  painful, thick, discolored, elongated toenails  of both feet that become tender and patient cannot cut because of thickness. Pain is aggravated when wearing enclosed shoe gear and relieved with periodic professional debridement.  Patient voices no new pedal concerns on today's visit.  Medications reviewed in chart.  Allergies  Allergen Reactions  . Sulfa Antibiotics Diarrhea and Rash  . Sulfasalazine Diarrhea and Rash     Objective:  Physical Examination:  Vascular Examination: Capillary refill time to digits immediate b/l.  Palpable DP/PT pulses b/l.  Digital hair absent b/l.   No edema noted b/l.  Skin temperature gradient WNL b/l.  Dermatological Examination: Skin with normal turgor, texture and tone b/l.  No open wounds b/l.  No interdigital macerations noted b/l.  Elongated, thick, discolored brittle toenails with subungual debris and pain on dorsal palpation of nailbeds 1-5 b/l.  Musculoskeletal Examination: Muscle strength 5/5 to all muscle groups b/l.  No gross bony deformities b/l.  No pain, crepitus or joint discomfort with active/passive ROM.  Neurological Examination: Sensation intact 5/5 b/l with 10 gram monofilament.  Assessment: Mycotic nail infection with pain 1-5 b/l  Plan: 1. No new findings. No new orders. 2. Toenails 1-5 b/l were debrided in length and girth without iatrogenic laceration. 3. Continue soft, supportive shoe gear daily. 4. Report any pedal injuries to medical professional. 5. Follow up 9 weeks. 6. Patient/POA to call should there be a question/concern in there interim.

## 2019-06-19 NOTE — Progress Notes (Signed)
PPM Remote  

## 2019-07-12 ENCOUNTER — Ambulatory Visit: Payer: Medicare PPO | Attending: Internal Medicine

## 2019-07-12 DIAGNOSIS — Z23 Encounter for immunization: Secondary | ICD-10-CM

## 2019-07-12 NOTE — Progress Notes (Signed)
   Covid-19 Vaccination Clinic  Name:  [name]    MRN: [id number] DOB: 06/15/1935  07/12/2019  [name] was observed post Covid-19 immunization for 15 minutes without incidence. She was provided with Vaccine Information Sheet and instruction to access the V-Safe system.   [name] was instructed to call 911 with any severe reactions post vaccine: Marland Kitchen Difficulty breathing  . Swelling of your face and throat  . A fast heartbeat  . A bad rash all over your body  . Dizziness and weakness    Immunizations Administered    Name Date Dose VIS Date Route   Pfizer COVID-19 Vaccine 07/12/2019  1:13 PM 0.3 mL 06/02/2019 Intramuscular   Manufacturer: [place]   Lot: [id number]   [place]: [id number]

## 2019-08-02 ENCOUNTER — Ambulatory Visit: Payer: Medicare PPO | Attending: Internal Medicine

## 2019-08-02 DIAGNOSIS — Z23 Encounter for immunization: Secondary | ICD-10-CM

## 2019-08-02 NOTE — Progress Notes (Signed)
   Covid-19 Vaccination Clinic  Name:  [name]    MRN: [id number] DOB: July 24, 1934  08/02/2019  Ms. Weeks was observed post Covid-19 immunization for 15 minutes without incidence. She was provided with Vaccine Information Sheet and instruction to access the V-Safe system.   [name] was instructed to call 911 with any severe reactions post vaccine: Marland Kitchen Difficulty breathing  . Swelling of your face and throat  . A fast heartbeat  . A bad rash all over your body  . Dizziness and weakness    Immunizations Administered    Name Date Dose VIS Date Route   Pfizer COVID-19 Vaccine 08/02/2019 11:36 AM 0.3 mL 06/02/2019 Intramuscular   Manufacturer: [place]   Lot: [id number]   [place]: [id number]

## 2019-08-16 ENCOUNTER — Encounter: Payer: Self-pay | Admitting: Podiatry

## 2019-08-16 ENCOUNTER — Ambulatory Visit: Payer: Medicare PPO | Admitting: Podiatry

## 2019-08-16 ENCOUNTER — Other Ambulatory Visit: Payer: Self-pay

## 2019-08-16 VITALS — Temp 97.7°F

## 2019-08-16 DIAGNOSIS — M79674 Pain in right toe(s): Secondary | ICD-10-CM

## 2019-08-16 DIAGNOSIS — B351 Tinea unguium: Secondary | ICD-10-CM

## 2019-08-16 DIAGNOSIS — M79675 Pain in left toe(s): Secondary | ICD-10-CM | POA: Diagnosis not present

## 2019-08-16 MED ORDER — NONFORMULARY OR COMPOUNDED ITEM: 3 refills | Status: DC

## 2019-08-16 NOTE — Patient Instructions (Addendum)
[place] [phone]  Antifungal nail solution   Onychomycosis/Fungal Toenails  WHAT IS IT? An infection that lies within the keratin of your nail plate that is caused by a fungus.  WHY ME? Fungal infections affect all ages, sexes, races, and creeds.  There may be many factors that predispose you to a fungal infection such as age, coexisting medical conditions such as diabetes, or an autoimmune disease; stress, medications, fatigue, genetics, etc.  Bottom line: fungus thrives in a warm, moist environment and your shoes offer such a location.  IS IT CONTAGIOUS? Theoretically, yes.  You do not want to share shoes, nail clippers or files with someone who has fungal toenails.  Walking around barefoot in the same room or sleeping in the same bed is unlikely to transfer the organism.  It is important to realize, however, that fungus can spread easily from one nail to the next on the same foot.  HOW DO WE TREAT THIS?  There are several ways to treat this condition.  Treatment may depend on many factors such as age, medications, pregnancy, liver and kidney conditions, etc.  It is best to ask your doctor which options are available to you.  1. No treatment.   Unlike many other medical concerns, you can live with this condition.  However for many people this can be a painful condition and may lead to ingrown toenails or a bacterial infection.  It is recommended that you keep the nails cut short to help reduce the amount of fungal nail. 2. Topical treatment.  These range from herbal remedies to prescription strength nail lacquers.  About 40-50% effective, topicals require twice daily application for approximately 9 to 12 months or until an entirely new nail has grown out.  The most effective topicals are medical grade medications available through physicians offices. 3. Oral antifungal medications.  With an 80-90% cure rate, the most common oral medication requires 3 to 4 months of therapy and  stays in your system for a year as the new nail grows out.  Oral antifungal medications do require blood work to make sure it is a safe drug for you.  A liver function panel will be performed prior to starting the medication and after the first month of treatment.  It is important to have the blood work performed to avoid any harmful side effects.  In general, this medication safe but blood work is required. 4. Laser Therapy.  This treatment is performed by applying a specialized laser to the affected nail plate.  This therapy is noninvasive, fast, and non-painful.  It is not covered by insurance and is therefore, out of pocket.  The results have been very good with a 80-95% cure rate.  The [facility] is the only practice in the area to offer this therapy. 5. Permanent Nail Avulsion.  Removing the entire nail so that a new nail will not grow back.[place]

## 2019-08-17 NOTE — Progress Notes (Signed)
Subjective: [name] presents today for follow up of painful mycotic nails b/l that are difficult to trim. Pain interferes with ambulation. Aggravating factors include wearing enclosed shoe gear. Pain is relieved with periodic professional debridement.   Her husband is also present during the visit. He would like to know what treatment is available for her fungal toenails.  Allergies  Allergen Reactions  . Sulfa Antibiotics Diarrhea and Rash  . Sulfasalazine Diarrhea and Rash    Objective: Vitals:   08/16/19 1308  Temp: 97.7 F (36.5 C)   Vascular Examination:  Capillary refill time to digits immediate b/l, palpable DP pulses b/l, palpable PT pulses b/l, pedal hair absent b/l and skin temperature gradient within normal limits b/l  Dermatological Examination: Pedal skin with normal turgor, texture and tone bilaterally, no open wounds bilaterally and toenails 1-5 b/l elongated, dystrophic, thickened, crumbly with subungual debris  Musculoskeletal: Normal muscle strength 5/5 to all lower extremity muscle groups bilaterally, no gross bony deformities bilaterally and no pain crepitus or joint limitation noted with ROM b/l  Neurological: Protective sensation intact 5/5 intact bilaterally with 10g monofilament b/l  Assessment: 1. Pain due to onychomycosis of toenails of both feet    Plan: -Toenails 1-5 b/l were debrided in length and girth with sterile nail nippers and dremel without iatrogenic bleeding. -Discussed topical, laser and oral medication. Patient opted for topical treatment with compounded medication. Rx written for nonformulary compounding topical antifungal: [facility]: Antifungal cream - Terbinafine 3%, Fluconazole 2%, Tea Tree Oil 5%, Urea 10%, Ibuprofen 2% in DMSO Suspension #78ml. Apply to the affected nail(s) at bedtime. -Patient to continue soft, supportive shoe gear daily. -Patient to report any pedal injuries to medical professional  immediately. -Patient/POA to call should there be question/concern in the interim.  Return in about 9 weeks (around 10/18/2019) for nail trim/ Eliquis.

## 2019-08-23 ENCOUNTER — Ambulatory Visit (INDEPENDENT_AMBULATORY_CARE_PROVIDER_SITE_OTHER): Payer: Medicare PPO | Admitting: *Deleted

## 2019-08-23 DIAGNOSIS — I442 Atrioventricular block, complete: Secondary | ICD-10-CM | POA: Diagnosis not present

## 2019-08-25 ENCOUNTER — Telehealth: Payer: Self-pay | Admitting: Podiatry

## 2019-08-25 LAB — CUP PACEART REMOTE DEVICE CHECK
Battery Remaining Longevity: 96 mo
Battery Remaining Percentage: 91 %
Battery Voltage: 2.95 V
Brady Statistic AP VP Percent: 24 %
Brady Statistic AP VS Percent: 2.3 %
Brady Statistic AS VP Percent: 31 %
Brady Statistic AS VS Percent: 42 %
Brady Statistic RA Percent Paced: 25 %
Brady Statistic RV Percent Paced: 54 %
Date Time Interrogation Session: 20210304221313
Implantable Lead Implant Date: 20140613
Implantable Lead Implant Date: 20140613
Implantable Lead Location: 753859
Implantable Lead Location: 753860
Implantable Pulse Generator Implant Date: 20140613
Lead Channel Impedance Value: 340 Ohm
Lead Channel Impedance Value: 410 Ohm
Lead Channel Pacing Threshold Amplitude: 0.5 V
Lead Channel Pacing Threshold Amplitude: 1 V
Lead Channel Pacing Threshold Pulse Width: 0.5 ms
Lead Channel Pacing Threshold Pulse Width: 0.5 ms
Lead Channel Sensing Intrinsic Amplitude: 12 mV
Lead Channel Sensing Intrinsic Amplitude: 2.9 mV
Lead Channel Setting Pacing Amplitude: 2 V
Lead Channel Setting Pacing Amplitude: 2.5 V
Lead Channel Setting Pacing Pulse Width: 0.5 ms
Lead Channel Setting Sensing Sensitivity: 2 mV
Pulse Gen Model: 2210
Pulse Gen Serial Number: 7474456

## 2019-08-25 NOTE — Telephone Encounter (Signed)
Pt husband called and stated that [facility] stated that they never received the order to fill that script for pt.

## 2019-08-25 NOTE — Progress Notes (Signed)
PPM Remote  

## 2019-08-25 NOTE — Telephone Encounter (Signed)
Faxed a new form, with demographic to [facility] for rx of 08/16/2019.

## 2019-09-15 DIAGNOSIS — M25562 Pain in left knee: Secondary | ICD-10-CM | POA: Diagnosis not present

## 2019-09-15 DIAGNOSIS — M25561 Pain in right knee: Secondary | ICD-10-CM | POA: Diagnosis not present

## 2019-09-15 DIAGNOSIS — M25551 Pain in right hip: Secondary | ICD-10-CM | POA: Diagnosis not present

## 2019-09-17 ENCOUNTER — Other Ambulatory Visit: Payer: Self-pay | Admitting: Adult Health

## 2019-10-17 DIAGNOSIS — E039 Hypothyroidism, unspecified: Secondary | ICD-10-CM | POA: Diagnosis not present

## 2019-10-24 DIAGNOSIS — M810 Age-related osteoporosis without current pathological fracture: Secondary | ICD-10-CM | POA: Diagnosis not present

## 2019-10-24 DIAGNOSIS — E039 Hypothyroidism, unspecified: Secondary | ICD-10-CM | POA: Diagnosis not present

## 2019-10-24 DIAGNOSIS — E559 Vitamin D deficiency, unspecified: Secondary | ICD-10-CM | POA: Diagnosis not present

## 2019-10-30 ENCOUNTER — Other Ambulatory Visit: Payer: Self-pay | Admitting: Internal Medicine

## 2019-10-30 MED ORDER — LORAZEPAM 0.5 MG PO TABS: 0.5000 mg | ORAL_TABLET | Freq: Two times a day (BID) | ORAL | 5 refills | Status: DC | PRN

## 2019-10-30 NOTE — Telephone Encounter (Signed)
Fax received from [facility] at [address], [place], [place] [address] Lorazepam 0.5mg  tablets Last fill: 09/07/19 Last OV/CPE: 03/16/19 Next CPE due: 03/16/20

## 2019-10-30 NOTE — Telephone Encounter (Signed)
Appt scheduled

## 2019-10-30 NOTE — Telephone Encounter (Signed)
Patient is due for Medicare wellness and CPE after Sept 24. Please call her husband as she has dementia for an appt before refilling

## 2019-10-31 ENCOUNTER — Other Ambulatory Visit: Payer: Self-pay

## 2019-10-31 ENCOUNTER — Encounter: Payer: Self-pay | Admitting: Podiatry

## 2019-10-31 ENCOUNTER — Ambulatory Visit: Payer: Medicare PPO | Admitting: Podiatry

## 2019-10-31 DIAGNOSIS — B351 Tinea unguium: Secondary | ICD-10-CM

## 2019-10-31 DIAGNOSIS — M79675 Pain in left toe(s): Secondary | ICD-10-CM

## 2019-10-31 DIAGNOSIS — M79674 Pain in right toe(s): Secondary | ICD-10-CM

## 2019-10-31 NOTE — Patient Instructions (Signed)

## 2019-11-09 NOTE — Progress Notes (Signed)
Subjective: [name] is a 85 y.o. female patient seen today painful mycotic nails b/l that are difficult to trim. Pain interferes with ambulation. Aggravating factors include wearing enclosed shoe gear. Pain is relieved with periodic professional debridement  Patient Active Problem List   Diagnosis Date Noted  . Primary osteoarthritis of first carpometacarpal joint of left hand 09/29/2016  . Alzheimer's disease ([place]) 03/19/2016  . Paroxysmal atrial fibrillation ([place]) 07/10/2015  . Atrial fibrillation ([place]) 04/29/2015  . Closed right hip fracture ([place]) 01/02/2015  . Weight loss, unintentional 02/19/2014  . Dementia arising in the senium and presenium ([place]) 01/30/2014  . 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 Memory loss 07/07/2013  . Memory retention disorder 03/22/2013  . Pacemaker 03/09/2013  . Complete heart block -intermittent   . Syncope 11/18/2012  . Personal history of adenomatous colonic polyps 11/12/2011  . History of TIA (transient ischemic attack) 09/19/2011  . Hypothyroidism 01/13/2011  . Hyperlipidemia 01/13/2011  . First degree AV block 01/13/2011  . Gait disturbance 01/13/2011    Current Outpatient Medications on File Prior to Visit  Medication Sig Dispense Refill  . BISACODYL 5 MG EC tablet     . Calcium Carbonate (CALCIUM-CARB 600 PO) Take 1 tablet by mouth daily.     . cholecalciferol (VITAMIN D) 1000 units tablet Take 1,000 Units by mouth daily.    Marland Kitchen ELIQUIS 2.5 MG TABS tablet TAKE 1 TABLET(2.5 MG) BY MOUTH TWICE DAILY 60 tablet 11  . fluticasone (FLONASE) 50 MCG/ACT nasal spray Place 2 sprays into both nostrils daily.    Marland Kitchen levothyroxine (SYNTHROID, LEVOTHROID) 75 MCG tablet Take 75 mcg by mouth daily before breakfast.     . LORazepam (ATIVAN) 0.5 MG tablet Take 1 tablet (0.5 mg total) by mouth 2 (two) times daily as needed. for anxiety 60 tablet 5  . Melatonin 5 MG TABS Take 5 mg by mouth 3 times/day as  needed-between meals & bedtime.     Marland Kitchen NAMZARIC 28-10 MG CP24 TAKE 1 CAPSULE BY MOUTH AT BEDTIME 90 capsule 0  . NONFORMULARY OR COMPOUNDED ITEM Antifungal solution: Terbinafine 3%, Fluconazole 2%, Tea Tree Oil 5%, Urea 10%, Ibuprofen 2% in DMSO suspension #75mL 1 each 3  . vitamin B-12 (CYANOCOBALAMIN) 500 MCG tablet Take 500 mcg by mouth daily.      No current facility-administered medications on file prior to visit.    Allergies  Allergen Reactions  . Sulfa Antibiotics Diarrhea and Rash  . Sulfasalazine Diarrhea and Rash    Objective: Physical Exam  General: [name] is a pleasant 85 y.o. y.o. Caucasian female, in NAD. AAO x 3.   Vascular:  Neurovascular status unchanged b/l. Capillary refill time to digits immediate b/l. Palpable DP pulses b/l. Palpable PT pulses b/l. Pedal hair absent b/l Skin temperature gradient within normal limits b/l. No edema noted b/l.  Dermatological:  Pedal skin with normal turgor, texture and tone bilaterally. No open wounds bilaterally. No interdigital macerations bilaterally. Toenails 1-5 b/l elongated, dystrophic, thickened, crumbly with subungual debris and tenderness to dorsal palpation.  Musculoskeletal:  Normal muscle strength 5/5 to all lower extremity muscle groups bilaterally. No gross bony deformities bilaterally. No pain crepitus or joint limitation noted with ROM b/l.  Neurological:  Protective sensation intact 5/5 intact bilaterally with 10g monofilament b/l. Vibratory sensation intact b/l.  Assessment and Plan:  1. Pain due to onychomycosis of toenails of both feet    -Examined patient. -No new findings. No new  orders. -Toenails 1-5 b/l were debrided in length and girth with sterile nail nippers and dremel without iatrogenic bleeding.  -Patient to continue soft, supportive shoe gear daily. -Patient to report any pedal injuries to medical professional immediately. -Patient/POA to call should there be question/concern in the  interim.  Return in about 9 weeks (around 01/02/2020) for nail trim/ Eliquis.  Marzetta Board, DPM

## 2019-11-22 ENCOUNTER — Ambulatory Visit (INDEPENDENT_AMBULATORY_CARE_PROVIDER_SITE_OTHER): Payer: Medicare PPO | Admitting: *Deleted

## 2019-11-22 DIAGNOSIS — I442 Atrioventricular block, complete: Secondary | ICD-10-CM | POA: Diagnosis not present

## 2019-11-22 LAB — CUP PACEART REMOTE DEVICE CHECK
Battery Remaining Longevity: 83 mo
Battery Remaining Percentage: 81 %
Battery Voltage: 2.93 V
Brady Statistic AP VP Percent: 24 %
Brady Statistic AP VS Percent: 2.2 %
Brady Statistic AS VP Percent: 32 %
Brady Statistic AS VS Percent: 41 %
Brady Statistic RA Percent Paced: 26 %
Brady Statistic RV Percent Paced: 56 %
Date Time Interrogation Session: 20210602034148
Implantable Lead Implant Date: 20140613
Implantable Lead Implant Date: 20140613
Implantable Lead Location: 753859
Implantable Lead Location: 753860
Implantable Pulse Generator Implant Date: 20140613
Lead Channel Impedance Value: 310 Ohm
Lead Channel Impedance Value: 360 Ohm
Lead Channel Pacing Threshold Amplitude: 0.5 V
Lead Channel Pacing Threshold Amplitude: 1 V
Lead Channel Pacing Threshold Pulse Width: 0.5 ms
Lead Channel Pacing Threshold Pulse Width: 0.5 ms
Lead Channel Sensing Intrinsic Amplitude: 12 mV
Lead Channel Sensing Intrinsic Amplitude: 2.6 mV
Lead Channel Setting Pacing Amplitude: 2 V
Lead Channel Setting Pacing Amplitude: 2.5 V
Lead Channel Setting Pacing Pulse Width: 0.5 ms
Lead Channel Setting Sensing Sensitivity: 2 mV
Pulse Gen Model: 2210
Pulse Gen Serial Number: 7474456

## 2019-11-28 NOTE — Progress Notes (Signed)
Remote pacemaker transmission.   

## 2019-12-12 ENCOUNTER — Encounter: Payer: Self-pay | Admitting: Adult Health

## 2019-12-12 ENCOUNTER — Ambulatory Visit: Payer: Medicare PPO | Admitting: Adult Health

## 2019-12-12 ENCOUNTER — Other Ambulatory Visit: Payer: Self-pay

## 2019-12-12 VITALS — Ht 69.0 in | Wt 138.0 lb

## 2019-12-12 DIAGNOSIS — G309 Alzheimer's disease, unspecified: Secondary | ICD-10-CM | POA: Diagnosis not present

## 2019-12-12 DIAGNOSIS — F0280 Dementia in other diseases classified elsewhere without behavioral disturbance: Secondary | ICD-10-CM | POA: Diagnosis not present

## 2019-12-12 MED ORDER — NAMZARIC 28-10 MG PO CP24: 1.0000 | ORAL_CAPSULE | Freq: Every day | ORAL | 3 refills | Status: AC

## 2019-12-12 NOTE — Progress Notes (Signed)
I have read the note, and I agree with the clinical assessment and plan.  [name] [name]   

## 2019-12-12 NOTE — Progress Notes (Signed)
PATIENT: [name] DOB: March 22, 1935  REASON FOR VISIT: follow up HISTORY FROM: patient  HISTORY OF PRESENT ILLNESS: Today 12/12/19:  Ms. Amalia Hailey is an 85 year old female with a history of Alzheimer's disease.  She returns today for follow-up.  She remains on Namzaric.  She lives at home with her husband but her daughter checks on her twice a week.  Daughter states that she helps her with bathing and brushing her teeth.  She also cooks for them.  Her husband is able to manage the medications.  Daughter reports that she sleeps a lot during the day but sleeps all night as well.  She reports in regards to her mood daughter reports that 90% of the time she is laughing and approximately 10% of time she may be agitated.  She returns today for an evaluation.  HISTORY 06/08/19:  Ms. Snuffer is an 85 year old female with a history of Alzheimer's disease.  She returns today for follow-up.  She is currently on Namzaric.  She lives at home with her husband.  He feels that her memory has remained the same.  She is able to complete most ADLs independently.  He reports that her daughter does come over and helps her bathe.  She is able to feed herself.  She no longer cooks.  She no longer operates a motor vehicle.  Denies any trouble sleeping.  She returns today for follow-up.   REVIEW OF SYSTEMS: Out of a complete 14 system review of symptoms, the patient complains only of the following symptoms, and all other reviewed systems are negative.  See HPI  ALLERGIES: Allergies  Allergen Reactions  . Sulfa Antibiotics Diarrhea and Rash  . Sulfasalazine Diarrhea and Rash    HOME MEDICATIONS: Outpatient Medications Prior to Visit  Medication Sig Dispense Refill  . BISACODYL 5 MG EC tablet     . Calcium Carbonate (CALCIUM-CARB 600 PO) Take 1 tablet by mouth daily.     . cholecalciferol (VITAMIN D) 1000 units tablet Take 1,000 Units by mouth daily.    Marland Kitchen ELIQUIS 2.5 MG TABS tablet TAKE 1 TABLET(2.5 MG) BY  MOUTH TWICE DAILY 60 tablet 11  . levothyroxine (SYNTHROID, LEVOTHROID) 75 MCG tablet Take 75 mcg by mouth daily before breakfast.     . LORazepam (ATIVAN) 0.5 MG tablet Take 1 tablet (0.5 mg total) by mouth 2 (two) times daily as needed. for anxiety 60 tablet 5  . NAMZARIC 28-10 MG CP24 TAKE 1 CAPSULE BY MOUTH AT BEDTIME 90 capsule 0  . NONFORMULARY OR COMPOUNDED ITEM Antifungal solution: Terbinafine 3%, Fluconazole 2%, Tea Tree Oil 5%, Urea 10%, Ibuprofen 2% in DMSO suspension #60mL 1 each 3  . vitamin B-12 (CYANOCOBALAMIN) 500 MCG tablet Take 500 mcg by mouth daily.     . fluticasone (FLONASE) 50 MCG/ACT nasal spray Place 2 sprays into both nostrils daily.    . Melatonin 5 MG TABS Take 5 mg by mouth 3 times/day as needed-between meals & bedtime.      No facility-administered medications prior to visit.    PAST MEDICAL HISTORY: Past Medical History:  Diagnosis Date  . A-fib ([place])   . Allergy   . Arthritis   . Asthma   . Colon polyps   . Complete heart block -intermittent    a. 11/2012 s/p SJM [facility] DR RF dc ppm.  . Depression   . Environmental allergies   . Hyperlipidemia   . Memory retention disorder 03/22/2013  . Osteoarthritis    hands  .  Thyroid disease     PAST SURGICAL HISTORY: Past Surgical History:  Procedure Laterality Date  . APPENDECTOMY  1965  . CERVICAL POLYPECTOMY  1996  . COLONOSCOPY  2007   adenomatous polyps  . EYE SURGERY    . HAND SURGERY    . HIP ARTHROPLASTY Right 01/03/2015   Procedure: ARTHROPLASTY BIPOLAR HIP (HEMIARTHROPLASTY);  Surgeon: [name], MD;  Location: [facility];  Service: Orthopedics;  Laterality: Right;  . INSERT / REPLACE / REMOVE PACEMAKER  12/02/2012    Dr Lovena Le  . KNEE SURGERY    . ovarian cystectomy  1965  . PERMANENT PACEMAKER INSERTION N/A 12/02/2012   Procedure: PERMANENT PACEMAKER INSERTION;  Surgeon: Evans Lance, MD;  Location[facility] LAB;  Service: Cardiovascular;  Laterality: N/A;  . SALPINGOOPHORECTOMY     right   . tumor removed  1985   abdomen    FAMILY HISTORY: Family History  Problem Relation Age of Onset  . Colon cancer Father   . Colon polyps Brother   . Diabetes Brother        maternal aunt  . Coronary artery disease Brother     SOCIAL HISTORY: Social History   Socioeconomic History  . Marital status: Married    Spouse name: [name]  . Number of children: 3  . Years of education: College  . Highest education level: Not on file  Occupational History  . Occupation: elem. school teacher    Comment: retired  Tobacco Use  . Smoking status: Never Smoker  . Smokeless tobacco: Never Used  Vaping Use  . Vaping Use: Never used  Substance and Sexual Activity  . Alcohol use: Yes    Alcohol/week: 0.0 standard drinks    Comment: occas. which is seldom  . Drug use: No  . Sexual activity: Not Currently  Other Topics Concern  . Not on file  Social History Narrative   Patient has one daughter, two adopted children(son,daughter).   Patient lives at home with spouse [name]).   Patient is retired.   Patient has a college education.   Patient is right-handed.   Patient drinks very little caffeine.   Social Determinants of Health   Financial Resource Strain:   . Difficulty of Paying Living Expenses:   Food Insecurity:   . Worried About Charity fundraiser in the Last Year:   . Arboriculturist in the Last Year:   Transportation Needs:   . Film/video editor (Medical):   Marland Kitchen Lack of Transportation (Non-Medical):   Physical Activity:   . Days of Exercise per Week:   . Minutes of Exercise per Session:   Stress:   . Feeling of Stress :   Social Connections:   . Frequency of Communication with Friends and Family:   . Frequency of Social Gatherings with Friends and Family:   . Attends Religious Services:   . Active Member of Clubs or Organizations:   . Attends Archivist Meetings:   Marland Kitchen Marital Status:   Intimate Partner Violence:   . Fear of Current or Ex-Partner:    . Emotionally Abused:   Marland Kitchen Physically Abused:   . Sexually Abused:       PHYSICAL EXAM  Vitals:   12/12/19 1331  Weight: 138 lb (62.6 kg)  Height: 5\' 9"  (1.753 m)   Body mass index is 20.38 kg/m.   MMSE - Mini Mental State Exam 06/08/2019 09/20/2017 03/22/2017  Not completed: Unable to complete - -  Orientation to  time - 1 0  Orientation to Place - 0 1  Registration - 3 3  Attention/ Calculation - 5 0  Recall - 0 0  Language- name 2 objects - 2 2  Language- repeat - 0 0  Language- follow 3 step command - 3 3  Language- read & follow direction - 1 1  Write a sentence - 1 1  Copy design - 1 1  Total score - 17 12     Generalized: Well developed, in no acute distress   Neurological examination  Mentation: Alert oriented to time, place, history taking. Follows all commands speech and language fluent Cranial nerve II-XII: Pupils were equal round reactive to light. Extraocular movements were full, visual field were full on confrontational test.  Head turning and shoulder shrug  were normal and symmetric. Motor: The motor testing reveals 5 over 5 strength of all 4 extremities. Good symmetric motor tone is noted throughout.  Sensory: Sensory testing is intact to soft touch on all 4 extremities. No evidence of extinction is noted.  Coordination: Cerebellar testing reveals good finger-nose-finger and heel-to-shin bilaterally.  Gait and station: Gait is normal.  Reflexes: Deep tendon reflexes are symmetric and normal bilaterally.   DIAGNOSTIC DATA (LABS, IMAGING, TESTING) - I reviewed patient records, labs, notes, testing and imaging myself where available.  Lab Results  Component Value Date   WBC 6.4 03/09/2019   HGB 15.0 03/09/2019   HCT 44.2 03/09/2019   MCV 92.7 03/09/2019   PLT 249 03/09/2019      Component Value Date/Time   NA 142 03/09/2019 0907   NA 131 (L) 10/21/2017 1014   K 3.9 03/09/2019 0907   CL 102 03/09/2019 0907   CO2 29 03/09/2019 0907   GLUCOSE 88  03/09/2019 0907   BUN 20 03/09/2019 0907   BUN 18 10/21/2017 1014   CREATININE 0.98 (H) 03/09/2019 0907   CALCIUM 9.5 03/09/2019 0907   PROT 7.0 03/09/2019 0907   ALBUMIN 4.2 03/30/2018 1125   AST 18 03/09/2019 0907   ALT 10 03/09/2019 0907   ALKPHOS 49 03/30/2018 1125   BILITOT 0.6 03/09/2019 0907   GFRNONAA 54 (L) 03/09/2019 0907   GFRAA 62 03/09/2019 0907   Lab Results  Component Value Date   CHOL 269 (H) 03/09/2019   HDL 79 03/09/2019   LDLCALC 168 (H) 03/09/2019   LDLDIRECT 127.6 04/21/2013   TRIG 101 03/09/2019   CHOLHDL 3.4 03/09/2019   Lab Results  Component Value Date   HGBA1C 5.9 (H) 03/05/2014   Lab Results  Component Value Date   VITAMINB12 1,048 07/13/2017   Lab Results  Component Value Date   TSH 1.77 03/09/2019      ASSESSMENT AND PLAN 85 y.o. year old female  has a past medical history of A-fib ([place]), Allergy, Arthritis, Asthma, Colon polyps, Complete heart block -intermittent, Depression, Environmental allergies, Hyperlipidemia, Memory retention disorder (03/22/2013), Osteoarthritis, and Thyroid disease. here with:  Memory disturbance    Continue Namzaric  Unable to complete MMSE today  Advised if symptoms worsen or she develops new symptoms  Follow-up in 6 months or sooner if needed   I spent 20 minutes of face-to-face and non-face-to-face time with patient.  This included previsit chart review, lab review, study review, order entry, electronic health record documentation, patient education.  Ward Givens, MSN, NP-C 12/12/2019, 1:36 PM [facility] [address], [place], [place] [address] [phone]

## 2019-12-12 NOTE — Patient Instructions (Signed)
Your Plan:  Continue Namzaric If your symptoms worsen or you develop new symptoms please let us know.    Thank you for coming to see [place] at [facility]. I hope we have been able to provide you high quality care today.  You may receive a patient satisfaction survey over the next few weeks. We would appreciate your feedback and comments so that we may continue to improve ourselves and the health of our patients.

## 2019-12-20 ENCOUNTER — Other Ambulatory Visit: Payer: Self-pay | Admitting: Adult Health

## 2019-12-21 DIAGNOSIS — R55 Syncope and collapse: Secondary | ICD-10-CM

## 2019-12-21 HISTORY — DX: Syncope and collapse: R55

## 2020-01-02 ENCOUNTER — Ambulatory Visit: Payer: Medicare PPO | Admitting: Podiatry

## 2020-01-02 ENCOUNTER — Encounter: Payer: Self-pay | Admitting: Podiatry

## 2020-01-02 ENCOUNTER — Other Ambulatory Visit: Payer: Self-pay

## 2020-01-02 DIAGNOSIS — M79675 Pain in left toe(s): Secondary | ICD-10-CM

## 2020-01-02 DIAGNOSIS — B351 Tinea unguium: Secondary | ICD-10-CM

## 2020-01-02 DIAGNOSIS — M79674 Pain in right toe(s): Secondary | ICD-10-CM

## 2020-01-02 NOTE — Progress Notes (Signed)
Daughter states patient was getting restless today as this was her 4th appointment on today. She is rescheduled for tomorrow morning.

## 2020-01-03 ENCOUNTER — Ambulatory Visit (INDEPENDENT_AMBULATORY_CARE_PROVIDER_SITE_OTHER): Payer: Medicare PPO | Admitting: Podiatry

## 2020-01-03 ENCOUNTER — Encounter: Payer: Self-pay | Admitting: Podiatry

## 2020-01-03 DIAGNOSIS — M79675 Pain in left toe(s): Secondary | ICD-10-CM

## 2020-01-03 DIAGNOSIS — M79674 Pain in right toe(s): Secondary | ICD-10-CM | POA: Diagnosis not present

## 2020-01-03 DIAGNOSIS — B351 Tinea unguium: Secondary | ICD-10-CM

## 2020-01-03 NOTE — Progress Notes (Signed)
Subjective:  Patient ID: [name], female    DOB: 09/01/1934,  MRN: [id number]  [name] presents to clinic today for painful thick toenails that are difficult to trim. Pain interferes with ambulation. Aggravating factors include wearing enclosed shoe gear. Pain is relieved with periodic professional debridement..  85 y.o. female presents with the above complaint.  Reports painfully elongated nails to both feet.  She has h/o memory problems. Her husband is present during today's visit. He voices no new concerns on today's visit. He states at times [name] will not allow him to apply the compounded topical antifungal medication to her toenails.  Review of Systems: Negative except as noted in the HPI. Past Medical History:  Diagnosis Date  . A-fib ([place])   . Allergy   . Arthritis   . Asthma   . Colon polyps   . Complete heart block -intermittent    a. 11/2012 s/p SJM Accent DR RF dc ppm.  . Depression   . Environmental allergies   . Hyperlipidemia   . Memory retention disorder 03/22/2013  . Osteoarthritis    hands  . Thyroid disease    Past Surgical History:  Procedure Laterality Date  . APPENDECTOMY  1965  . CERVICAL POLYPECTOMY  1996  . COLONOSCOPY  2007   adenomatous polyps  . EYE SURGERY    . HAND SURGERY    . HIP ARTHROPLASTY Right 01/03/2015   Procedure: ARTHROPLASTY BIPOLAR HIP (HEMIARTHROPLASTY);  Surgeon: [name], MD;  Location: [facility];  Service: Orthopedics;  Laterality: Right;  . INSERT / REPLACE / REMOVE PACEMAKER  12/02/2012    Dr Lovena Le  . KNEE SURGERY    . ovarian cystectomy  1965  . PERMANENT PACEMAKER INSERTION N/A 12/02/2012   Procedure: PERMANENT PACEMAKER INSERTION;  Surgeon: Evans Lance, MD;  Location: [facility] CATH LAB;  Service: Cardiovascular;  Laterality: N/A;  . SALPINGOOPHORECTOMY     right  . tumor removed  1985   abdomen    Current Outpatient Medications:  .  BISACODYL 5 MG EC tablet, , Disp: , Rfl:  .  Calcium Carbonate  (CALCIUM-CARB 600 PO), Take 1 tablet by mouth daily. , Disp: , Rfl:  .  cholecalciferol (VITAMIN D) 1000 units tablet, Take 1,000 Units by mouth daily., Disp: , Rfl:  .  ELIQUIS 2.5 MG TABS tablet, TAKE 1 TABLET(2.5 MG) BY MOUTH TWICE DAILY, Disp: 60 tablet, Rfl: 11 .  levothyroxine (SYNTHROID, LEVOTHROID) 75 MCG tablet, Take 75 mcg by mouth daily before breakfast. , Disp: , Rfl:  .  LORazepam (ATIVAN) 0.5 MG tablet, Take 1 tablet (0.5 mg total) by mouth 2 (two) times daily as needed. for anxiety, Disp: 60 tablet, Rfl: 5 .  Memantine HCl-Donepezil HCl (NAMZARIC) 28-10 MG CP24, Take 1 capsule by mouth at bedtime., Disp: 90 capsule, Rfl: 3 .  NONFORMULARY OR COMPOUNDED ITEM, Antifungal solution: Terbinafine 3%, Fluconazole 2%, Tea Tree Oil 5%, Urea 10%, Ibuprofen 2% in DMSO suspension #66mL, Disp: 1 each, Rfl: 3 .  vitamin B-12 (CYANOCOBALAMIN) 500 MCG tablet, Take 500 mcg by mouth daily. , Disp: , Rfl:  Allergies  Allergen Reactions  . Sulfa Antibiotics Diarrhea and Rash  . Sulfasalazine Diarrhea and Rash   Social History   Occupational History  . Occupation: elem. school teacher    Comment: retired  Tobacco Use  . Smoking status: Never Smoker  . Smokeless tobacco: Never Used  Vaping Use  . Vaping Use: Never used  Substance and Sexual Activity  .  Alcohol use: Yes    Alcohol/week: 0.0 standard drinks    Comment: occas. which is seldom  . Drug use: No  . Sexual activity: Not Currently    Objective:   Constitutional [name] is a pleasant 85 y.o. Caucasian female, WD, WN in NAD.Marland Kitchen AAO x 3.   Vascular Dorsalis pedis pulses palpable bilaterally. Posterior tibial pulses palpable bilaterally. Capillary refill normal to all digits.  No cyanosis or clubbing noted. Pedal hair growth absent b/l.  No pedal edema b/l.   Neurologic Normal speech. Protective sensation intact with 10 gram monofilament b/l. Epicritic sensation to light touch grossly present bilaterally.  Dermatologic Pedal  skin with normal turgor, texture and tone b/l.  Nails thick, elongated, dystrophic with pain on palpation x 10. No open wounds. No skin lesions.  Orthopedic: Normal joint ROM without pain or crepitus bilaterally. No visible deformities. No bony tenderness.   Radiographs: None Assessment:  No diagnosis found. Plan:  Patient was evaluated and treated and all questions answered.  Onychomycosis with pain -Nails palliatively debridement as below -Educated on self-care  Procedure: Nail Debridement Rationale: Pain Type of Debridement: manual, sharp debridement. Instrumentation: Nail nipper, rotary burr. Number of Nails: 10 -Examined patient. -No new findings. No new orders. -Toenails 1-5 b/l were debrided in length and girth with sterile nail nippers and dremel without iatrogenic bleeding.  -Continue to use compounded topical antifungal solution from [place] daily on toenails as patient permits. -Patient to report any pedal injuries to medical professional immediately. -Patient to continue soft, supportive shoe gear daily. -Patient/POA to call should there be question/concern in the interim.  Return in about 3 months (around 04/04/2020) for nail trim.  Marzetta Board, DPM

## 2020-01-14 ENCOUNTER — Inpatient Hospital Stay (HOSPITAL_COMMUNITY)
Admission: EM | Admit: 2020-01-14 | Discharge: 2020-01-20 | DRG: 315 | Disposition: A | Discharge status: Skilled Nursing Facility | Payer: Medicare PPO | Attending: Internal Medicine | Admitting: Internal Medicine

## 2020-01-14 ENCOUNTER — Emergency Department (HOSPITAL_COMMUNITY): Payer: Medicare PPO

## 2020-01-14 ENCOUNTER — Other Ambulatory Visit: Payer: Self-pay

## 2020-01-14 ENCOUNTER — Encounter (HOSPITAL_COMMUNITY): Payer: Self-pay | Admitting: Emergency Medicine

## 2020-01-14 DIAGNOSIS — F0390 Unspecified dementia without behavioral disturbance: Secondary | ICD-10-CM | POA: Diagnosis present

## 2020-01-14 DIAGNOSIS — G309 Alzheimer's disease, unspecified: Secondary | ICD-10-CM | POA: Diagnosis not present

## 2020-01-14 DIAGNOSIS — E039 Hypothyroidism, unspecified: Secondary | ICD-10-CM | POA: Diagnosis present

## 2020-01-14 DIAGNOSIS — Z7901 Long term (current) use of anticoagulants: Secondary | ICD-10-CM | POA: Diagnosis not present

## 2020-01-14 DIAGNOSIS — Z7401 Bed confinement status: Secondary | ICD-10-CM | POA: Diagnosis not present

## 2020-01-14 DIAGNOSIS — G301 Alzheimer's disease with late onset: Secondary | ICD-10-CM

## 2020-01-14 DIAGNOSIS — Z79899 Other long term (current) drug therapy: Secondary | ICD-10-CM

## 2020-01-14 DIAGNOSIS — Z96641 Presence of right artificial hip joint: Secondary | ICD-10-CM | POA: Diagnosis present

## 2020-01-14 DIAGNOSIS — Z79890 Hormone replacement therapy (postmenopausal): Secondary | ICD-10-CM

## 2020-01-14 DIAGNOSIS — I480 Paroxysmal atrial fibrillation: Secondary | ICD-10-CM | POA: Diagnosis present

## 2020-01-14 DIAGNOSIS — Z781 Physical restraint status: Secondary | ICD-10-CM

## 2020-01-14 DIAGNOSIS — F0280 Dementia in other diseases classified elsewhere without behavioral disturbance: Secondary | ICD-10-CM

## 2020-01-14 DIAGNOSIS — R52 Pain, unspecified: Secondary | ICD-10-CM | POA: Diagnosis not present

## 2020-01-14 DIAGNOSIS — F0391 Unspecified dementia with behavioral disturbance: Secondary | ICD-10-CM | POA: Diagnosis present

## 2020-01-14 DIAGNOSIS — R0902 Hypoxemia: Secondary | ICD-10-CM | POA: Diagnosis not present

## 2020-01-14 DIAGNOSIS — I959 Hypotension, unspecified: Secondary | ICD-10-CM | POA: Diagnosis present

## 2020-01-14 DIAGNOSIS — M2550 Pain in unspecified joint: Secondary | ICD-10-CM | POA: Diagnosis not present

## 2020-01-14 DIAGNOSIS — F419 Anxiety disorder, unspecified: Secondary | ICD-10-CM | POA: Diagnosis present

## 2020-01-14 DIAGNOSIS — G319 Degenerative disease of nervous system, unspecified: Secondary | ICD-10-CM | POA: Diagnosis not present

## 2020-01-14 DIAGNOSIS — R55 Syncope and collapse: Secondary | ICD-10-CM

## 2020-01-14 DIAGNOSIS — Z20822 Contact with and (suspected) exposure to covid-19: Secondary | ICD-10-CM | POA: Diagnosis present

## 2020-01-14 DIAGNOSIS — I444 Left anterior fascicular block: Secondary | ICD-10-CM | POA: Diagnosis present

## 2020-01-14 DIAGNOSIS — Z950 Presence of cardiac pacemaker: Secondary | ICD-10-CM | POA: Diagnosis not present

## 2020-01-14 DIAGNOSIS — Q048 Other specified congenital malformations of brain: Secondary | ICD-10-CM | POA: Diagnosis not present

## 2020-01-14 DIAGNOSIS — R9431 Abnormal electrocardiogram [ECG] [EKG]: Secondary | ICD-10-CM | POA: Diagnosis not present

## 2020-01-14 DIAGNOSIS — G9389 Other specified disorders of brain: Secondary | ICD-10-CM | POA: Diagnosis not present

## 2020-01-14 DIAGNOSIS — R410 Disorientation, unspecified: Secondary | ICD-10-CM | POA: Diagnosis not present

## 2020-01-14 DIAGNOSIS — I442 Atrioventricular block, complete: Secondary | ICD-10-CM | POA: Diagnosis not present

## 2020-01-14 DIAGNOSIS — M6281 Muscle weakness (generalized): Secondary | ICD-10-CM | POA: Diagnosis not present

## 2020-01-14 DIAGNOSIS — Z800 Family history of malignant neoplasm of digestive organs: Secondary | ICD-10-CM | POA: Diagnosis not present

## 2020-01-14 DIAGNOSIS — R404 Transient alteration of awareness: Secondary | ICD-10-CM | POA: Diagnosis not present

## 2020-01-14 LAB — COMPREHENSIVE METABOLIC PANEL
ALT: 14 U/L (ref 0–44)
AST: 22 U/L (ref 15–41)
Albumin: 3.7 g/dL (ref 3.5–5.0)
Alkaline Phosphatase: 52 U/L (ref 38–126)
Anion gap: 8 (ref 5–15)
BUN: 24 mg/dL — ABNORMAL HIGH (ref 8–23)
CO2: 27 mmol/L (ref 22–32)
Calcium: 9 mg/dL (ref 8.9–10.3)
Chloride: 105 mmol/L (ref 98–111)
Creatinine, Ser: 1.34 mg/dL — ABNORMAL HIGH (ref 0.44–1.00)
GFR calc Af Amer: 42 mL/min — ABNORMAL LOW (ref >60)
GFR calc non Af Amer: 37 mL/min — ABNORMAL LOW (ref >60)
Glucose, Bld: 131 mg/dL — ABNORMAL HIGH (ref 70–99)
Potassium: 3.8 mmol/L (ref 3.5–5.1)
Sodium: 140 mmol/L (ref 135–145)
Total Bilirubin: 0.7 mg/dL (ref 0.3–1.2)
Total Protein: 6.7 g/dL (ref 6.5–8.1)

## 2020-01-14 LAB — CBC WITH DIFFERENTIAL/PLATELET
Abs Immature Granulocytes: 0.03 10*3/uL (ref 0.00–0.07)
Basophils Absolute: 0 10*3/uL (ref 0.0–0.1)
Basophils Relative: 1 %
Eosinophils Absolute: 0.1 10*3/uL (ref 0.0–0.5)
Eosinophils Relative: 1 %
HCT: 42.7 % (ref 36.0–46.0)
Hemoglobin: 13.8 g/dL (ref 12.0–15.0)
Immature Granulocytes: 0 %
Lymphocytes Relative: 22 %
Lymphs Abs: 1.8 10*3/uL (ref 0.7–4.0)
MCH: 31.2 pg (ref 26.0–34.0)
MCHC: 32.3 g/dL (ref 30.0–36.0)
MCV: 96.4 fL (ref 80.0–100.0)
Monocytes Absolute: 0.7 10*3/uL (ref 0.1–1.0)
Monocytes Relative: 8 %
Neutro Abs: 5.4 10*3/uL (ref 1.7–7.7)
Neutrophils Relative %: 68 %
Platelets: 193 10*3/uL (ref 150–400)
RBC: 4.43 MIL/uL (ref 3.87–5.11)
RDW: 11.9 % (ref 11.5–15.5)
WBC: 8 10*3/uL (ref 4.0–10.5)
nRBC: 0 % (ref 0.0–0.2)

## 2020-01-14 LAB — TYPE AND SCREEN
ABO/RH(D): O POS
Antibody Screen: NEGATIVE

## 2020-01-14 LAB — SARS CORONAVIRUS 2 BY RT PCR (HOSPITAL ORDER, PERFORMED IN ~~LOC~~ HOSPITAL LAB): SARS Coronavirus 2: NEGATIVE

## 2020-01-14 LAB — CBG MONITORING, ED: Glucose-Capillary: 169 mg/dL — ABNORMAL HIGH (ref 70–99)

## 2020-01-14 LAB — PROTIME-INR
INR: 1.1 (ref 0.8–1.2)
Prothrombin Time: 13.8 seconds (ref 11.4–15.2)

## 2020-01-14 LAB — LACTIC ACID, PLASMA: Lactic Acid, Venous: 1.9 mmol/L (ref 0.5–1.9)

## 2020-01-14 LAB — TROPONIN I (HIGH SENSITIVITY): Troponin I (High Sensitivity): 4 ng/L (ref <18)

## 2020-01-14 MED ORDER — DONEPEZIL HCL 10 MG PO TABS
10.0000 mg | ORAL_TABLET | Freq: Every day | ORAL | Status: DC
  Administered 2020-01-15 – 2020-01-19 (×6): 10 mg via ORAL
  Filled 2020-01-14 (×5): qty 1

## 2020-01-14 MED ORDER — LORAZEPAM 2 MG/ML IJ SOLN
0.5000 mg | Freq: Once | INTRAMUSCULAR | Status: AC
  Administered 2020-01-14: 0.5 mg via INTRAMUSCULAR
  Filled 2020-01-14: qty 1

## 2020-01-14 MED ORDER — MEMANTINE HCL-DONEPEZIL HCL ER 28-10 MG PO CP24: 1.0000 | ORAL_CAPSULE | Freq: Every day | ORAL | Status: DC

## 2020-01-14 MED ORDER — SODIUM CHLORIDE 0.9 % IV BOLUS
500.0000 mL | Freq: Once | INTRAVENOUS | Status: AC
  Administered 2020-01-14: 500 mL via INTRAVENOUS

## 2020-01-14 MED ORDER — VITAMIN D 25 MCG (1000 UNIT) PO TABS
5000.0000 [IU] | ORAL_TABLET | Freq: Every day | ORAL | Status: DC
  Administered 2020-01-15 – 2020-01-20 (×6): 5000 [IU] via ORAL
  Filled 2020-01-14 (×6): qty 5

## 2020-01-14 MED ORDER — LORAZEPAM 0.5 MG PO TABS
0.5000 mg | ORAL_TABLET | Freq: Every day | ORAL | Status: DC
  Administered 2020-01-15 – 2020-01-18 (×5): 0.5 mg via ORAL
  Filled 2020-01-14 (×5): qty 1

## 2020-01-14 MED ORDER — MEMANTINE HCL ER 28 MG PO CP24
28.0000 mg | ORAL_CAPSULE | Freq: Every day | ORAL | Status: DC
  Administered 2020-01-15 – 2020-01-19 (×6): 28 mg via ORAL
  Filled 2020-01-14 (×7): qty 1

## 2020-01-14 MED ORDER — DOCUSATE SODIUM 50 MG PO CAPS
50.0000 mg | ORAL_CAPSULE | Freq: Every day | ORAL | Status: DC
  Administered 2020-01-16 – 2020-01-20 (×5): 50 mg via ORAL
  Filled 2020-01-14 (×6): qty 1

## 2020-01-14 MED ORDER — ACETAMINOPHEN 325 MG PO TABS
650.0000 mg | ORAL_TABLET | Freq: Four times a day (QID) | ORAL | Status: DC | PRN
  Administered 2020-01-20: 650 mg via ORAL
  Filled 2020-01-14 (×2): qty 2

## 2020-01-14 MED ORDER — CALCIUM CITRATE 950 (200 CA) MG PO TABS
950.0000 mg | ORAL_TABLET | Freq: Every day | ORAL | Status: DC
  Administered 2020-01-15 – 2020-01-20 (×6): 950 mg via ORAL
  Filled 2020-01-14 (×6): qty 1

## 2020-01-14 MED ORDER — LEVOTHYROXINE SODIUM 75 MCG PO TABS
75.0000 ug | ORAL_TABLET | Freq: Every day | ORAL | Status: DC
  Administered 2020-01-15 – 2020-01-20 (×5): 75 ug via ORAL
  Filled 2020-01-14 (×7): qty 1

## 2020-01-14 MED ORDER — APIXABAN 2.5 MG PO TABS
2.5000 mg | ORAL_TABLET | Freq: Two times a day (BID) | ORAL | Status: DC
  Administered 2020-01-15 – 2020-01-20 (×12): 2.5 mg via ORAL
  Filled 2020-01-14 (×12): qty 1

## 2020-01-14 MED ORDER — SODIUM CHLORIDE 0.9% FLUSH
3.0000 mL | Freq: Two times a day (BID) | INTRAVENOUS | Status: DC
  Administered 2020-01-15 – 2020-01-20 (×11): 3 mL via INTRAVENOUS

## 2020-01-14 MED ORDER — LORAZEPAM 2 MG/ML IJ SOLN
0.5000 mg | Freq: Four times a day (QID) | INTRAMUSCULAR | Status: DC | PRN
  Administered 2020-01-15: 0.5 mg via INTRAVENOUS
  Filled 2020-01-14: qty 1

## 2020-01-14 NOTE — H&P (Signed)
History and Physical    [name] ELF:[id number] DOB: 11/02/1934 DOA: 01/14/2020  PCP: Elby Showers, MD  Patient coming from: Home  I have personally briefly reviewed patient's old medical records in [place]  Chief Complaint: Syncope  HPI: [name] is a 85 y.o. female with medical history significant of CHB s/p PPM, A.Fib on eliquis, dementia.  Pt presents to ED after syncope episode at home.  The patient was in her normal state of health and had just completed eating lunch.  The patient then got up from dinner table and walked down the hall.  The patient then had a witnessed syncopal event.  The husband reports that she just "fell over onto the floor. " She may have struck her head.  She was unresponsive for approximately 1 minute.  No seizure like activity.   ED Course: Pt at baseline mental status with severe dementia.   Review of Systems: Unable to perform due to dementia.  Past Medical History:  Diagnosis Date  . A-fib ([place])   . Allergy   . Arthritis   . Asthma   . Colon polyps   . Complete heart block -intermittent    a. 11/2012 s/p SJM Accent DR RF dc ppm.  . Depression   . Environmental allergies   . Hyperlipidemia   . Memory retention disorder 03/22/2013  . Osteoarthritis    hands  . Thyroid disease     Past Surgical History:  Procedure Laterality Date  . APPENDECTOMY  1965  . CERVICAL POLYPECTOMY  1996  . COLONOSCOPY  2007   adenomatous polyps  . EYE SURGERY    . HAND SURGERY    . HIP ARTHROPLASTY Right 01/03/2015   Procedure: ARTHROPLASTY BIPOLAR HIP (HEMIARTHROPLASTY);  Surgeon: [name], MD;  Location: [facility];  Service: Orthopedics;  Laterality: Right;  . INSERT / REPLACE / REMOVE PACEMAKER  12/02/2012    Dr Lovena Le  . KNEE SURGERY    . ovarian cystectomy  1965  . PERMANENT PACEMAKER INSERTION N/A 12/02/2012   Procedure: PERMANENT PACEMAKER INSERTION;  Surgeon: Evans Lance, MD;  Location: [facility] CATH LAB;  Service: Cardiovascular;   Laterality: N/A;  . SALPINGOOPHORECTOMY     right  . tumor removed  1985   abdomen     reports that she has never smoked. She has never used smokeless tobacco. She reports current alcohol use. She reports that she does not use drugs.  Allergies  Allergen Reactions  . Sulfa Antibiotics Diarrhea and Rash  . Sulfasalazine Diarrhea and Rash    Family History  Problem Relation Age of Onset  . Colon cancer Father   . Colon polyps Brother   . Diabetes Brother        maternal aunt  . Coronary artery disease Brother      Prior to Admission medications   Medication Sig Start Date End Date Taking? Authorizing Provider  acetaminophen (TYLENOL) 325 MG tablet Take 650 mg by mouth every 6 (six) hours as needed (pain).   Yes [provider]  b complex vitamins tablet Take 1 tablet by mouth daily.   Yes [provider]  Calcium Citrate 250 MG TABS Take 250 mg by mouth daily.   Yes [provider]  Cholecalciferol (VITAMIN D3) 125 MCG (5000 UT) TABS Take 5,000 Units by mouth daily.   Yes [provider]  DOCUSATE SODIUM PO Take 1 capsule by mouth daily.   Yes [provider]  ELIQUIS 2.5  MG TABS tablet TAKE 1 TABLET(2.5 MG) BY MOUTH TWICE DAILY Patient taking differently: Take 2.5 mg by mouth 2 (two) times daily.  04/10/19  Yes Nahser, Wonda Cheng, MD  levothyroxine (SYNTHROID, LEVOTHROID) 75 MCG tablet Take 75 mcg by mouth daily before breakfast.  12/13/14  Yes [provider]  LORazepam (ATIVAN) 0.5 MG tablet Take 1 tablet (0.5 mg total) by mouth 2 (two) times daily as needed. for anxiety Patient taking differently: Take 0.5 mg by mouth at bedtime. for anxiety 10/30/19  Yes Baxley, Cresenciano Lick, MD  Memantine HCl-Donepezil HCl [facility]) 28-10 MG CP24 Take 1 capsule by mouth at bedtime. 12/12/19  Yes Ward Givens, NP  NONFORMULARY OR COMPOUNDED ITEM Antifungal solution: Terbinafine 3%, Fluconazole 2%, Tea Tree Oil 5%, Urea 10%, Ibuprofen 2% in DMSO  suspension #77mL Patient taking differently: Apply 1 application topically See admin instructions. Antifungal solution: Terbinafine 3%, Fluconazole 2%, Tea Tree Oil 5%, Urea 10%, Ibuprofen 2% in DMSO suspension #70mL (compounded at [facility]) - apply topically to toes once a week. 08/16/19  Yes Marzetta Board, DPM    Physical Exam: Vitals:   01/14/20 1537 01/14/20 1628 01/14/20 1911 01/14/20 2132  BP:  (!) 125/86 (!) 116/103 (!) 111/54  Pulse: 69  83 65  Resp:   16 18  Temp:      TempSrc:      SpO2:   100% 100%    Constitutional: NAD, calm, comfortable Eyes: PERRL, lids and conjunctivae normal ENMT: Mucous membranes are moist. Posterior pharynx clear of any exudate or lesions.Normal dentition.  Neck: normal, supple, no masses, no thyromegaly Respiratory: clear to auscultation bilaterally, no wheezing, no crackles. Normal respiratory effort. No accessory muscle use.  Cardiovascular: Regular rate and rhythm, no murmurs / rubs / gallops. No extremity edema. 2+ pedal pulses. No carotid bruits.  Abdomen: no tenderness, no masses palpated. No hepatosplenomegaly. Bowel sounds positive.  Musculoskeletal: no clubbing / cyanosis. No joint deformity upper and lower extremities. Good ROM, no contractures. Normal muscle tone.  Skin: no rashes, lesions, ulcers. No induration Neurologic: MAE, makes eye contact.  Psychiatric: Severe dementia at baseline   Labs on Admission: I have personally reviewed following labs and imaging studies  CBC: Recent Labs  Lab 01/14/20 2138  WBC 8.0  NEUTROABS 5.4  HGB 13.8  HCT 42.7  MCV 96.4  PLT 161   Basic Metabolic Panel: Recent Labs  Lab 01/14/20 1449  NA 140  K 3.8  CL 105  CO2 27  GLUCOSE 131*  BUN 24*  CREATININE 1.34*  CALCIUM 9.0   GFR: CrCl cannot be calculated (Unknown ideal weight.). Liver Function Tests: Recent Labs  Lab 01/14/20 1449  AST 22  ALT 14  ALKPHOS 52  BILITOT 0.7  PROT 6.7  ALBUMIN 3.7   No  results for input(s): LIPASE, AMYLASE in the last 168 hours. No results for input(s): AMMONIA in the last 168 hours. Coagulation Profile: Recent Labs  Lab 01/14/20 1449  INR 1.1   Cardiac Enzymes: No results for input(s): CKTOTAL, CKMB, CKMBINDEX, TROPONINI in the last 168 hours. BNP (last 3 results) No results for input(s): PROBNP in the last 8760 hours. HbA1C: No results for input(s): HGBA1C in the last 72 hours. CBG: Recent Labs  Lab 01/14/20 1430  GLUCAP 169*   Lipid Profile: No results for input(s): CHOL, HDL, LDLCALC, TRIG, CHOLHDL, LDLDIRECT in the last 72 hours. Thyroid Function Tests: No results for input(s): TSH, T4TOTAL, FREET4, T3FREE, THYROIDAB in the last 72 hours. Anemia  Panel: No results for input(s): VITAMINB12, FOLATE, FERRITIN, TIBC, IRON, RETICCTPCT in the last 72 hours. Urine analysis:    Component Value Date/Time   COLORURINE YELLOW 03/30/2018 [place] 03/30/2018 1157   LABSPEC 1.018 03/30/2018 1157   PHURINE 5.0 03/30/2018 1157   GLUCOSEU NEGATIVE 03/30/2018 1157   HGBUR SMALL (A) 03/30/2018 1157   BILIRUBINUR NEG 03/16/2019 1651   KETONESUR 5 (A) 03/30/2018 1157   PROTEINUR Negative 03/16/2019 1651   PROTEINUR NEGATIVE 03/30/2018 1157   UROBILINOGEN 0.2 03/16/2019 1651   UROBILINOGEN 0.2 08/08/2014 1912   NITRITE NEG 03/16/2019 1651   NITRITE NEGATIVE 03/30/2018 1157   LEUKOCYTESUR Negative 03/16/2019 1651    Radiological Exams on Admission: CT Head Wo Contrast  Result Date: 01/14/2020 CLINICAL DATA:  Syncope EXAM: CT HEAD WITHOUT CONTRAST TECHNIQUE: Contiguous axial images were obtained from the base of the skull through the vertex without intravenous contrast. COMPARISON:  04/12/2013 FINDINGS: Brain: There is normal anatomic configuration of the brain. There is mild to moderate parenchymal atrophy, which has progressed since prior examination, but appears commensurate with the patient's age. There is mild ventriculomegaly  with particular enlargement of the temporal horns of the lateral ventricles. While this may reflect the sequela of central atrophy, normal pressure hydrocephalus could appear in similar fashion. There is no evidence of acute intracranial hemorrhage or infarct. No abnormal mass effect or midline shift. No abnormal intra or extra-axial mass lesion or fluid collection Vascular: Unremarkable Skull: Intact Sinuses/Orbits: Paranasal sinuses are clear. Orbits are unremarkable. Other: Mastoid air cells and middle ear cavities are clear. IMPRESSION: No acute intracranial hemorrhage or infarct. Progressive atrophic change. Mild ventriculomegaly may reflect the sequela of central atrophy, however, clinical correlation for signs and symptoms of normal pressure hydrocephalus may be helpful. Electronically Signed   By: Fidela Salisbury MD   On: 01/14/2020 18:22   DG Chest Port 1 View  Result Date: 01/14/2020 CLINICAL DATA:  Syncope EXAM: PORTABLE CHEST 1 VIEW COMPARISON:  01/02/2015 FINDINGS: The heart size and mediastinal contours are within normal limits. Left chest multi lead pacer. Both lungs are clear. The visualized skeletal structures are unremarkable. IMPRESSION: No acute abnormality of the lungs in portable projection. Electronically Signed   By: Eddie Candle M.D.   On: 01/14/2020 15:14    EKG: Independently reviewed.  Assessment/Plan Principal Problem:   Syncope Active Problems:   Pacemaker   Dementia arising in the senium and presenium ([place])   Paroxysmal atrial fibrillation ([place])    1. Syncope - 1. Syncope pathway 2. Tele monitor 3. 2d echo 4. Tried to interrogate PPM in ED but apparently [place] system is down for maintenance.  Try again tomorrow. 5. Check TSH 2. PAF - 1. Cont eliquis 3. Dementia - 1. Severe and baseline 2. Cont Namzaric 3. PRN ativan if needed  DVT prophylaxis: Eliquis Code Status: Full Family Communication: No family in room Disposition Plan: Home after syncope  work up [employer] called: None Admission status: Place in [place], [place] Hospitalists  How to contact the [facility] Attending or Consulting provider [place] or covering provider during after hours [place], for this patient?  1. Check the care team in [facility] and look for a) attending/consulting [facility] provider listed and b) the [facility] team listed 2. Log into [email or web address]  Amion Physician Scheduling and messaging for groups and whole hospitals  On call and physician scheduling software for group practices, residents, hospitalists and other medical  providers for call, clinic, rotation and shift schedules. OnCall [employer] is a hospital-wide system for scheduling doctors and paging doctors on call. EasyPlot is for scientific plotting and data analysis.  [email or web address]  and use [place]'s universal password to access. If you do not have the password, please contact the hospital operator.  3. Locate the [facility] provider you are looking for under [facility] and page to a number that you can be directly reached. 4. If you still have difficulty reaching the provider, please page the [facility] (Director on Call) for the Hospitalists listed on amion for assistance.  01/14/2020, 10:24 PM

## 2020-01-14 NOTE — ED Provider Notes (Addendum)
[place] EMERGENCY DEPARTMENT Provider Note   CSN: [id number] Arrival date & time: 01/14/20  1412     History Chief Complaint  Patient presents with  . Loss of Consciousness    [name] is a 85 y.o. female.  85 year old female with prior medical history as detailed below presents for evaluation following a reported syncopal event.  Patient was at home with her husband.  The patient was in her normal state of health and had just completed eating lunch.  The patient then got up from dinner table and walked down the hall.  The patient then had a witnessed syncopal event.  The husband reports that she just "fell over onto the floor. " She may have struck her head.  She was unresponsive for approximately 1 minute.  No seizure-like activity noted.  Patient has returned to her baseline mental status upon arrival to the ED.  She does have significant dementia.  History obtained is from the patient's husband who was at bedside.  The history is provided by the patient and the spouse.  Loss of Consciousness Episode history:  Single Most recent episode:  Today Duration:  1 minute Timing:  Rare Progression:  Resolved Chronicity:  New Witnessed: yes   Relieved by:  Nothing Worsened by:  Nothing      Past Medical History:  Diagnosis Date  . A-fib ([place])   . Allergy   . Arthritis   . Asthma   . Colon polyps   . Complete heart block -intermittent    a. 11/2012 s/p SJM [facility] DR RF dc ppm.  . Depression   . Environmental allergies   . Hyperlipidemia   . Memory retention disorder 03/22/2013  . Osteoarthritis    hands  . Thyroid disease     Patient Active Problem List   Diagnosis Date Noted  . Primary osteoarthritis of first carpometacarpal joint of left hand 09/29/2016  . Alzheimer's disease ([place]) 03/19/2016  . Paroxysmal atrial fibrillation ([place]) 07/10/2015  . Atrial fibrillation ([place]) 04/29/2015  . Closed right hip fracture ([place]) 01/02/2015  . Weight  loss, unintentional 02/19/2014  . Dementia arising in the senium and presenium ([place]) 01/30/2014  . 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 Memory loss 07/07/2013  . Memory retention disorder 03/22/2013  . Pacemaker 03/09/2013  . Complete heart block -intermittent   . Syncope 11/18/2012  . Personal history of adenomatous colonic polyps 11/12/2011  . History of TIA (transient ischemic attack) 09/19/2011  . Hypothyroidism 01/13/2011  . Hyperlipidemia 01/13/2011  . First degree AV block 01/13/2011  . Gait disturbance 01/13/2011    Past Surgical History:  Procedure Laterality Date  . APPENDECTOMY  1965  . CERVICAL POLYPECTOMY  1996  . COLONOSCOPY  2007   adenomatous polyps  . EYE SURGERY    . HAND SURGERY    . HIP ARTHROPLASTY Right 01/03/2015   Procedure: ARTHROPLASTY BIPOLAR HIP (HEMIARTHROPLASTY);  Surgeon: [name], MD;  Location: [facility] ORS;  Service: Orthopedics;  Laterality: Right;  . INSERT / REPLACE / REMOVE PACEMAKER  12/02/2012    Dr Lovena Le  . KNEE SURGERY    . ovarian cystectomy  1965  . PERMANENT PACEMAKER INSERTION N/A 12/02/2012   Procedure: PERMANENT PACEMAKER INSERTION;  Surgeon: Evans Lance, MD;  Location: [facility] CATH LAB;  Service: Cardiovascular;  Laterality: N/A;  . SALPINGOOPHORECTOMY     right  . tumor removed  1985   abdomen     OB  History   No obstetric history on file.     Family History  Problem Relation Age of Onset  . Colon cancer Father   . Colon polyps Brother   . Diabetes Brother        maternal aunt  . Coronary artery disease Brother     Social History   Tobacco Use  . Smoking status: Never Smoker  . Smokeless tobacco: Never Used  Vaping Use  . Vaping Use: Never used  Substance Use Topics  . Alcohol use: Yes    Alcohol/week: 0.0 standard drinks    Comment: occas. which is seldom  . Drug use: No    Home Medications Prior to Admission medications   Medication Sig Start  Date End Date Taking? Authorizing Provider  BISACODYL 5 MG EC tablet  11/09/18   [provider]  Calcium Carbonate (CALCIUM-CARB 600 PO) Take 1 tablet by mouth daily.     [provider]  cholecalciferol (VITAMIN D) 1000 units tablet Take 1,000 Units by mouth daily.    [provider]  ELIQUIS 2.5 MG TABS tablet TAKE 1 TABLET(2.5 MG) BY MOUTH TWICE DAILY 04/10/19   Nahser, Wonda Cheng, MD  levothyroxine (SYNTHROID, LEVOTHROID) 75 MCG tablet Take 75 mcg by mouth daily before breakfast.  12/13/14   [provider]  LORazepam (ATIVAN) 0.5 MG tablet Take 1 tablet (0.5 mg total) by mouth 2 (two) times daily as needed. for anxiety 10/30/19   Elby Showers, MD  Memantine HCl-Donepezil HCl [facility]) 28-10 MG CP24 Take 1 capsule by mouth at bedtime. 12/12/19   Ward Givens, NP  NONFORMULARY OR COMPOUNDED ITEM Antifungal solution: Terbinafine 3%, Fluconazole 2%, Tea Tree Oil 5%, Urea 10%, Ibuprofen 2% in DMSO suspension #78mL 08/16/19   [place], Stephani Police, DPM  vitamin B-12 (CYANOCOBALAMIN) 500 MCG tablet Take 500 mcg by mouth daily.     [provider]    Allergies    Sulfa antibiotics and Sulfasalazine  Review of Systems   Review of Systems  Cardiovascular: Positive for syncope.  All other systems reviewed and are negative.   Physical Exam Updated Vital Signs BP (!) 125/86   Pulse 69   Temp 97.8 F (36.6 C) (Oral)   Resp 12   SpO2 98%   Physical Exam Vitals and nursing note reviewed.  Constitutional:      General: She is not in acute distress.    Appearance: She is well-developed.  HENT:     Head: Normocephalic and atraumatic.  Eyes:     Conjunctiva/sclera: Conjunctivae normal.     Pupils: Pupils are equal, round, and reactive to light.  Cardiovascular:     Rate and Rhythm: Normal rate and regular rhythm.     Heart sounds: Normal heart sounds.  Pulmonary:     Effort: Pulmonary effort is normal. No respiratory distress.     Breath  sounds: Normal breath sounds.  Abdominal:     General: There is no distension.     Palpations: Abdomen is soft.     Tenderness: There is no abdominal tenderness.  Musculoskeletal:        General: No deformity. Normal range of motion.     Cervical back: Normal range of motion and neck supple.  Skin:    General: Skin is warm and dry.  Neurological:     General: No focal deficit present.     Mental Status: She is alert.     Comments: Demented, at mental status baseline.  ED Results / Procedures / Treatments   Labs (all labs ordered are listed, but only abnormal results are displayed) Labs Reviewed  COMPREHENSIVE METABOLIC PANEL - Abnormal; Notable for the following components:      Result Value   Glucose, Bld 131 (*)    BUN 24 (*)    Creatinine, Ser 1.34 (*)    GFR calc non Af Amer 37 (*)    GFR calc Af Amer 42 (*)    All other components within normal limits  CBG MONITORING, ED - Abnormal; Notable for the following components:   Glucose-Capillary 169 (*)    All other components within normal limits  SARS CORONAVIRUS 2 BY RT PCR (HOSPITAL ORDER, [place] [facility])  PROTIME-INR  LACTIC ACID, PLASMA  URINALYSIS, ROUTINE W REFLEX MICROSCOPIC  LACTIC ACID, PLASMA  CBC WITH DIFFERENTIAL/PLATELET  TYPE AND SCREEN  TROPONIN I (HIGH SENSITIVITY)  TROPONIN I (HIGH SENSITIVITY)    EKG EKG Interpretation  Date/Time:  Sunday January 14 2020 14:26:17 EDT Ventricular Rate:  73 PR Interval:    QRS Duration: 85 QT Interval:  446 QTC Calculation: 492 R Axis:   -71 Text Interpretation: Sinus or ectopic atrial rhythm Prolonged PR interval Left anterior fascicular block Probable anterior infarct, age indeterminate Lateral leads are also involved Confirmed by Dene Gentry [id number]) on 01/14/2020 2:52:08 PM   Radiology CT Head Wo Contrast  Result Date: 01/14/2020 CLINICAL DATA:  Syncope EXAM: CT HEAD WITHOUT CONTRAST TECHNIQUE: Contiguous axial images were  obtained from the base of the skull through the vertex without intravenous contrast. COMPARISON:  04/12/2013 FINDINGS: Brain: There is normal anatomic configuration of the brain. There is mild to moderate parenchymal atrophy, which has progressed since prior examination, but appears commensurate with the patient's age. There is mild ventriculomegaly with particular enlargement of the temporal horns of the lateral ventricles. While this may reflect the sequela of central atrophy, normal pressure hydrocephalus could appear in similar fashion. There is no evidence of acute intracranial hemorrhage or infarct. No abnormal mass effect or midline shift. No abnormal intra or extra-axial mass lesion or fluid collection Vascular: Unremarkable Skull: Intact Sinuses/Orbits: Paranasal sinuses are clear. Orbits are unremarkable. Other: Mastoid air cells and middle ear cavities are clear. IMPRESSION: No acute intracranial hemorrhage or infarct. Progressive atrophic change. Mild ventriculomegaly may reflect the sequela of central atrophy, however, clinical correlation for signs and symptoms of normal pressure hydrocephalus may be helpful. Electronically Signed   By: Fidela Salisbury MD   On: 01/14/2020 18:22   DG Chest Port 1 View  Result Date: 01/14/2020 CLINICAL DATA:  Syncope EXAM: PORTABLE CHEST 1 VIEW COMPARISON:  01/02/2015 FINDINGS: The heart size and mediastinal contours are within normal limits. Left chest multi lead pacer. Both lungs are clear. The visualized skeletal structures are unremarkable. IMPRESSION: No acute abnormality of the lungs in portable projection. Electronically Signed   By: Eddie Candle M.D.   On: 01/14/2020 15:14    Procedures Procedures (including critical care time)  Medications Ordered in ED Medications  sodium chloride 0.9 % bolus 500 mL (0 mLs Intravenous Stopped 01/14/20 1600)  LORazepam (ATIVAN) injection 0.5 mg (0.5 mg Intramuscular Given 01/14/20 1615)  LORazepam (ATIVAN) injection  0.5 mg (0.5 mg Intramuscular Given 01/14/20 1849)    ED Course  I have reviewed the triage vital signs and the nursing notes.  Pertinent labs & imaging results that were available during my care of the patient were reviewed by me and considered in my  medical decision making (see chart for details).    MDM Rules/Calculators/A&P                          MDM  Screen complete  [name] was evaluated in Emergency Department on 01/14/2020 for the symptoms described in the history of present illness. She was evaluated in the context of the global COVID-19 pandemic, which necessitated consideration that the patient might be at risk for infection with the SARS-CoV-2 virus that causes COVID-19. Institutional protocols and algorithms that pertain to the evaluation of patients at risk for COVID-19 are in a state of rapid change based on information released by regulatory bodies including the CDC and federal and state organizations. These policies and algorithms were followed during the patient's care in the ED.  Patient is presenting for evaluation of reported syncopal event.  Patient has returned to her mental status baseline upon arrival to the ED.  ED work-up does not reveal obvious cause of patient's syncopal event.  Patient would likely benefit from overnight observation for further assessment.  Hospitalist service is aware of case and will evaluate for admission. Dr. Alcario Drought aware of case - he does requests PPM interrogation and will evaluate for admission.   Final Clinical Impression(s) / ED Diagnoses Final diagnoses:  Syncope and collapse    Rx / DC Orders ED Discharge Orders    None       Valarie Merino, MD 01/14/20 1912    Valarie Merino, MD 01/14/20 2049

## 2020-01-14 NOTE — ED Notes (Signed)
Lab team attempted to draw blood, PT is combative, not cooperative at this time PT's spouse asks if patient can receive some medicine to help her "calm down." EDP notified

## 2020-01-14 NOTE — ED Triage Notes (Signed)
PT arrived by [facility] from home -per husband, patient "fell out on the floor after lunch." He does not know if she hit her head. -PT is on Eliquis, has a pacemaker PT is alert, oriented to self

## 2020-01-14 NOTE — ED Notes (Signed)
RN attempted to interrogate pts pacemaker, called the [facility] and they have reported maintance issues for July 24-25th. Will see if the report will be transmitted. RN RN notified MD.

## 2020-01-15 ENCOUNTER — Inpatient Hospital Stay (HOSPITAL_COMMUNITY): Payer: Medicare PPO

## 2020-01-15 DIAGNOSIS — R55 Syncope and collapse: Secondary | ICD-10-CM

## 2020-01-15 DIAGNOSIS — Z79899 Other long term (current) drug therapy: Secondary | ICD-10-CM | POA: Diagnosis not present

## 2020-01-15 DIAGNOSIS — F0390 Unspecified dementia without behavioral disturbance: Secondary | ICD-10-CM | POA: Diagnosis present

## 2020-01-15 DIAGNOSIS — Z20822 Contact with and (suspected) exposure to covid-19: Secondary | ICD-10-CM | POA: Diagnosis present

## 2020-01-15 DIAGNOSIS — F419 Anxiety disorder, unspecified: Secondary | ICD-10-CM | POA: Diagnosis present

## 2020-01-15 DIAGNOSIS — F0391 Unspecified dementia with behavioral disturbance: Secondary | ICD-10-CM | POA: Diagnosis present

## 2020-01-15 DIAGNOSIS — I480 Paroxysmal atrial fibrillation: Secondary | ICD-10-CM | POA: Diagnosis present

## 2020-01-15 DIAGNOSIS — Z96641 Presence of right artificial hip joint: Secondary | ICD-10-CM | POA: Diagnosis present

## 2020-01-15 DIAGNOSIS — Z781 Physical restraint status: Secondary | ICD-10-CM | POA: Diagnosis not present

## 2020-01-15 DIAGNOSIS — Z7901 Long term (current) use of anticoagulants: Secondary | ICD-10-CM | POA: Diagnosis not present

## 2020-01-15 DIAGNOSIS — E039 Hypothyroidism, unspecified: Secondary | ICD-10-CM | POA: Diagnosis present

## 2020-01-15 DIAGNOSIS — Z800 Family history of malignant neoplasm of digestive organs: Secondary | ICD-10-CM | POA: Diagnosis not present

## 2020-01-15 DIAGNOSIS — I959 Hypotension, unspecified: Secondary | ICD-10-CM | POA: Diagnosis present

## 2020-01-15 DIAGNOSIS — Z79890 Hormone replacement therapy (postmenopausal): Secondary | ICD-10-CM | POA: Diagnosis not present

## 2020-01-15 DIAGNOSIS — Z950 Presence of cardiac pacemaker: Secondary | ICD-10-CM | POA: Diagnosis not present

## 2020-01-15 DIAGNOSIS — I444 Left anterior fascicular block: Secondary | ICD-10-CM | POA: Diagnosis present

## 2020-01-15 LAB — ECHOCARDIOGRAM COMPLETE
Area-P 1/2: 2.76 cm2
S' Lateral: 2.5 cm
Single Plane A4C EF: 45.6 %

## 2020-01-15 LAB — BASIC METABOLIC PANEL
Anion gap: 6 (ref 5–15)
BUN: 22 mg/dL (ref 8–23)
CO2: 24 mmol/L (ref 22–32)
Calcium: 8.3 mg/dL — ABNORMAL LOW (ref 8.9–10.3)
Chloride: 109 mmol/L (ref 98–111)
Creatinine, Ser: 1 mg/dL (ref 0.44–1.00)
GFR calc Af Amer: >60 mL/min (ref >60)
GFR calc non Af Amer: 52 mL/min — ABNORMAL LOW (ref >60)
Glucose, Bld: 87 mg/dL (ref 70–99)
Potassium: 3.7 mmol/L (ref 3.5–5.1)
Sodium: 139 mmol/L (ref 135–145)

## 2020-01-15 LAB — CBC
HCT: 42.6 % (ref 36.0–46.0)
Hemoglobin: 14.1 g/dL (ref 12.0–15.0)
MCH: 31.8 pg (ref 26.0–34.0)
MCHC: 33.1 g/dL (ref 30.0–36.0)
MCV: 95.9 fL (ref 80.0–100.0)
Platelets: 185 10*3/uL (ref 150–400)
RBC: 4.44 MIL/uL (ref 3.87–5.11)
RDW: 11.9 % (ref 11.5–15.5)
WBC: 7 10*3/uL (ref 4.0–10.5)
nRBC: 0 % (ref 0.0–0.2)

## 2020-01-15 LAB — CBG MONITORING, ED: Glucose-Capillary: 78 mg/dL (ref 70–99)

## 2020-01-15 LAB — TSH: TSH: 1.62 u[IU]/mL (ref 0.350–4.500)

## 2020-01-15 MED ORDER — HALOPERIDOL LACTATE 5 MG/ML IJ SOLN
INTRAMUSCULAR | Status: AC
  Filled 2020-01-15: qty 1

## 2020-01-15 MED ORDER — QUETIAPINE FUMARATE 25 MG PO TABS
25.0000 mg | ORAL_TABLET | Freq: Every day | ORAL | Status: DC
  Administered 2020-01-15 – 2020-01-19 (×5): 25 mg via ORAL
  Filled 2020-01-15 (×7): qty 1

## 2020-01-15 MED ORDER — HALOPERIDOL LACTATE 5 MG/ML IJ SOLN
1.0000 mg | Freq: Four times a day (QID) | INTRAMUSCULAR | Status: DC | PRN
  Administered 2020-01-15: 1 mg via INTRAVENOUS

## 2020-01-15 MED ORDER — SODIUM CHLORIDE 0.9 % IV SOLN: INTRAVENOUS | Status: AC

## 2020-01-15 MED ORDER — HALOPERIDOL LACTATE 5 MG/ML IJ SOLN
2.0000 mg | Freq: Four times a day (QID) | INTRAMUSCULAR | Status: DC | PRN
  Administered 2020-01-15 – 2020-01-18 (×3): 2 mg via INTRAVENOUS
  Filled 2020-01-15 (×3): qty 1

## 2020-01-15 NOTE — ED Notes (Signed)
Skin and needs for mitten applied to hands assessed. Skin WDL. Pt. Currently still needs mittens for hands. Nurse will continue to monitor.

## 2020-01-15 NOTE — ED Notes (Signed)
Pt continues to be agitated, despite Haldol, deescalation techniques, and security presence. Bilateral wrist restraints had to be applied for safety, as she continues to try and get OOB and hit and throw things at staff. Dr. notified

## 2020-01-15 NOTE — ED Notes (Signed)
Pt noted to be increasing agitated, trying to pull off restraints and attempted to get out of bed, Pt medicated per [facility]

## 2020-01-15 NOTE — ED Notes (Addendum)
Skin and needs for mitten applied to hands assessed. Skin WDL. Pt. Currently still needs mittens for hands. Nurse will continue to monitor.

## 2020-01-15 NOTE — Consult Note (Addendum)
Cardiology Consultation:   Patient ID: [name] MRN: [id number]; DOB: Dec 03, 1934  Admit date: 01/14/2020 Date of Consult: 01/15/2020  Primary Care Provider: Elby Showers, MD [facility] Cardiologist: Melissa Moores, MD  [facility] Electrophysiologist:  [name]   Patient Profile:   [name] is a 85 y.o. female with a hx of Advanced AV block w/PPM, AFib, hypothyroidism, HLD, OA, dementia (severe), who is being seen today for the evaluation of abnormal pacer findings ans syncope at the request of Dr [name].  Device information SJM dual chamber PPM implanted 11/22/2012 Accent DR RF A and V leads ar [place] 2088 Known noise on A and V leads dates back to 2019  History of Present Illness:   [name] last saw Dr. Acie Osborn Nov 2020, noted progressive dementia, likely post pacing T changes, not felt candidate for ischemic w/u or interventions.  No changes were made.  She comes to the ER s/p syncopal event, admitted last evening The patient with advanced dementia, unable to give HPI, she denies pain of any kind and appears comfortable  I spoke with her husband over the phone.  She had finished eating and was walking down the hall when she seemed to stop a seconds and pur her head down like she needed to rest, he told her to go have a seat and she took another few steps and fell/fainted.  He says this lasted for more then a few seconds, maybe not quite as long as a minute. He was unable to help her up, she was a bit "ashen" and weak and he called 911.  LABS K+ 3.8 > 3.7 BUN/Creat 24/1.34 >> 22/1.00 WBC 7.0 H?H 14/42 Plts 185 TSH 1.620  COVID neg  EMS record reviewed, pt was awake, alert, confused (at reported baseline mental status),  BP's 100/p, 84/45, 94/55, HR 80's ECGs reviewed, baseline noise, SR 81bpm  PPM interrogation Battery and lead measurements are good She has some short episodes of noise known on her A and V leads S[employer] ([employer]) rep has checked her  device and noise measured and lead sensitivities adjusted to cover these up  In discussion with rep, these episodes have been short Pacing, sensing and impedances are all stable    Past Medical History:  Diagnosis Date   A-fib [facility])    Allergy    Arthritis    Asthma    Colon polyps    Complete heart block -intermittent    a. 11/2012 s/p SJM Accent DR RF dc ppm.   Depression    Environmental allergies    Hyperlipidemia    Memory retention disorder 03/22/2013   Osteoarthritis    hands   Thyroid disease     Past Surgical History:  Procedure Laterality Date   [place]   COLONOSCOPY  2007   adenomatous polyps   EYE SURGERY     HAND SURGERY     HIP ARTHROPLASTY Right 01/03/2015   Procedure: ARTHROPLASTY BIPOLAR HIP (HEMIARTHROPLASTY);  Surgeon: Melissa Nakayama, MD;  Location: [facility];  Service: Orthopedics;  Laterality: Right;   INSERT / REPLACE / REMOVE PACEMAKER  12/02/2012    Dr [name]   KNEE SURGERY     ovarian cystectomy  1965   PERMANENT PACEMAKER INSERTION N/A 12/02/2012   Procedure: PERMANENT PACEMAKER INSERTION;  Surgeon: Melissa Lance, MD;  Location: [facility] CATH LAB;  Service: Cardiovascular;  Laterality: N/A;   SALPINGOOPHORECTOMY     right  tumor removed  1985   abdomen     Home Medications:  Prior to Admission medications   Medication Sig Start Date End Date Taking? Authorizing Provider  acetaminophen (TYLENOL) 325 MG tablet Take 650 mg by mouth every 6 (six) hours as needed (pain).   Yes [provider]  b complex vitamins tablet Take 1 tablet by mouth daily.   Yes [provider]  Calcium Citrate 250 MG TABS Take 250 mg by mouth daily.   Yes [provider]  Cholecalciferol (VITAMIN D3) 125 MCG (5000 UT) TABS Take 5,000 Units by mouth daily.   Yes [provider]  DOCUSATE SODIUM PO Take 1 capsule by mouth daily.   Yes [provider]  ELIQUIS 2.5 MG TABS tablet TAKE 1 TABLET(2.5  MG) BY MOUTH TWICE DAILY Patient taking differently: Take 2.5 mg by mouth 2 (two) times daily.  04/10/19  Yes Nahser, Wonda Cheng, MD  levothyroxine (SYNTHROID, LEVOTHROID) 75 MCG tablet Take 75 mcg by mouth daily before breakfast.  12/13/14  Yes [provider]  LORazepam (ATIVAN) 0.5 MG tablet Take 1 tablet (0.5 mg total) by mouth 2 (two) times daily as needed. for anxiety Patient taking differently: Take 0.5 mg by mouth at bedtime. for anxiety 10/30/19  Yes Baxley, Cresenciano Lick, MD  Memantine HCl-Donepezil HCl [facility]) 28-10 MG CP24 Take 1 capsule by mouth at bedtime. 12/12/19  Yes Melissa Givens, NP  NONFORMULARY OR COMPOUNDED ITEM Antifungal solution: Terbinafine 3%, Fluconazole 2%, Tea Tree Oil 5%, Urea 10%, Ibuprofen 2% in DMSO suspension #61mL Patient taking differently: Apply 1 application topically See admin instructions. Antifungal solution: Terbinafine 3%, Fluconazole 2%, Tea Tree Oil 5%, Urea 10%, Ibuprofen 2% in DMSO suspension #68mL (compounded at [facility]) - apply topically to toes once a week. 08/16/19  Yes Marzetta Board, DPM    Inpatient Medications: Scheduled Meds:  apixaban  2.5 mg Oral BID   calcium citrate  950 mg Oral Daily   cholecalciferol  5,000 Units Oral Daily   docusate sodium  50 mg Oral Daily   memantine  28 mg Oral QHS   And   donepezil  10 mg Oral QHS   levothyroxine  75 mcg Oral Q0600   LORazepam  0.5 mg Oral QHS   sodium chloride flush  3 mL Intravenous Q12H   Continuous Infusions:   PRN Meds: acetaminophen, LORazepam  Allergies:    Allergies  Allergen Reactions   Sulfa Antibiotics Diarrhea and Rash   Sulfasalazine Diarrhea and Rash    Social History:   Social History   Socioeconomic History   Marital status: Married    Spouse name: [name]   Number of children: 3   Years of education: College   Highest education level: Not on file  Occupational History   Occupation: elem. school teacher    Comment: retired  Tobacco Use    Smoking status: Never Smoker   Smokeless tobacco: Never Used  Scientific laboratory technician Use: Never used  Substance and Sexual Activity   Alcohol use: Yes    Alcohol/week: 0.0 standard drinks    Comment: occas. which is seldom   Drug use: No   Sexual activity: Not Currently  Other Topics Concern   Not on file  Social History Narrative   Patient has one daughter, two adopted children(son,daughter).   Patient lives at home with spouse [name]).   Patient is retired.   Patient has a college education.   Patient is right-handed.  Patient drinks very little caffeine.   Social Determinants of Health   Financial Resource Strain:    Difficulty of Paying Living Expenses:   Food Insecurity:    Worried About Charity fundraiser in the Last Year:    Arboriculturist in the Last Year:   Transportation Needs:    Film/video editor (Medical):    Lack of Transportation (Non-Medical):   Physical Activity:    Days of Exercise per Week:    Minutes of Exercise per Session:   Stress:    Feeling of Stress :   Social Connections:    Frequency of Communication with Friends and Family:    Frequency of Social Gatherings with Friends and Family:    Attends Religious Services:    Active Member of Clubs or Organizations:    Attends Music therapist:    Marital Status:   Intimate Partner Violence:    Fear of Current or Ex-Partner:    Emotionally Abused:    Physically Abused:    Sexually Abused:     Family History:   Family History  Problem Relation Age of Onset   Colon cancer Father    Colon polyps Brother    Diabetes Brother        maternal aunt   Coronary artery disease Brother      ROS:  Please see the history of present illness. All other ROS reviewed and negative.     Physical Exam/Data:   Vitals:   01/15/20 0758 01/15/20 0815 01/15/20 0845 01/15/20 0900  BP: (!) 120/57 (!) 111/59 (!) 126/58 (!) 120/62  Pulse: 61 58 72 59  Resp: 15     Temp:      TempSrc:       SpO2: 100% 100% 100% 97%   No intake or output data in the 24 hours ending 01/15/20 1123 Last 3 Weights 12/12/2019 06/08/2019 05/16/2019  Weight (lbs) 138 lb 130 lb 9.6 oz 130 lb 6.4 oz  Weight (kg) 62.596 kg 59.24 kg 59.149 kg     There is no height or weight on file to calculate BMI.  General:  Well nourished, well developed, in no acute distress HEENT: normal Lymph: no adenopathy Neck: no JVD Endocrine:  No thryomegaly Vascular: No carotid bruits Cardiac:   RRR; no murmurs, gallops or rubs Lungs:   CTA b/l, no wheezing, rhonchi or rales  Abd: soft, nontender, no hepatomegaly  Ext: no edema Musculoskeletal:  No deformities Skin: warm and dry  Neuro:  No gross  focal motor abnormalities noted, known severe dementia Psych:  pleasantly confused   EKG:  The EKG was personally reviewed and demonstrates:    SR, 73bpm, 1st degree AVblock with variable PR 280-322ms   Telemetry:  Telemetry was personally reviewed and demonstrates:   AV paced, occ SR/Vpaced No arrhythmias     Relevant CV Studies:  11/29/2012: TTE Study Conclusions  Left ventricle: The cavity size was normal. Wall thickness  was normal. Systolic function was normal. The estimated  ejection fraction was in the range of 60% to 65%. Wall  motion was normal; there were no regional wall motion    Laboratory Data:  High Sensitivity Troponin:   Recent Labs  Lab 01/14/20 1449  TROPONINIHS 4     Chemistry Recent Labs  Lab 01/14/20 1449 01/15/20 0316  NA 140 139  K 3.8 3.7  CL 105 109  CO2 27 24  GLUCOSE 131* 87  BUN 24* 22  CREATININE 1.34*  1.00  CALCIUM 9.0 8.3*  GFRNONAA 37* 52*  GFRAA 42* >60  ANIONGAP 8 6    Recent Labs  Lab 01/14/20 1449  PROT 6.7  ALBUMIN 3.7  AST 22  ALT 14  ALKPHOS 52  BILITOT 0.7   Hematology Recent Labs  Lab 01/14/20 2138 01/15/20 0316  WBC 8.0 7.0  RBC 4.43 4.44  HGB 13.8 14.1  HCT 42.7 42.6  MCV 96.4 95.9  MCH 31.2 31.8  MCHC 32.3 33.1  RDW 11.9  11.9  PLT 193 185   BNPNo results for input(s): BNP, PROBNP in the last 168 hours.  DDimer No results for input(s): DDIMER in the last 168 hours.   Radiology/Studies:  CT Head Wo Contrast Result Date: 01/14/2020 CLINICAL DATA:  Syncope EXAM: CT HEAD WITHOUT CONTRAST TECHNIQUE: Contiguous axial images were obtained from the base of the skull through the vertex without intravenous contrast. COMPARISON:  04/12/2013 FINDINGS: Brain: There is normal anatomic configuration of the brain. There is mild to moderate parenchymal atrophy, which has progressed since prior examination, but appears commensurate with the patient's age. There is mild ventriculomegaly with particular enlargement of the temporal horns of the lateral ventricles. While this may reflect the sequela of central atrophy, normal pressure hydrocephalus could appear in similar fashion. There is no evidence of acute intracranial hemorrhage or infarct. No abnormal mass effect or midline shift. No abnormal intra or extra-axial mass lesion or fluid collection Vascular: Unremarkable Skull: Intact Sinuses/Orbits: Paranasal sinuses are clear. Orbits are unremarkable. Other: Mastoid air cells and middle ear cavities are clear. IMPRESSION: No acute intracranial hemorrhage or infarct. Progressive atrophic change. Mild ventriculomegaly may reflect the sequela of central atrophy, however, clinical correlation for signs and symptoms of normal pressure hydrocephalus may be helpful. Electronically Signed   By: Fidela Salisbury MD   On: 01/14/2020 18:22    DG Chest Port 1 View Result Date: 01/14/2020 CLINICAL DATA:  Syncope EXAM: PORTABLE CHEST 1 VIEW COMPARISON:  01/02/2015 FINDINGS: The heart size and mediastinal contours are within normal limits. Left chest multi lead pacer. Both lungs are clear. The visualized skeletal structures are unremarkable. IMPRESSION: No acute abnormality of the lungs in portable projection. Electronically Signed   By: Eddie Candle M.D.    On: 01/14/2020 15:14    Assessment and Plan:   1. Syncope      PPM findings noted Known noise on her A and V leads, do not think this participated in her syncope She is not device dependent Her noise episodes are momentary and noise revision is working  A and V lead sensitivities were adjusted and should adequately cover this up. No arrhythmias noted No need or plans for lead revision at this time  She was hypotensive with EMS, perhaps this played a role in her syncope Defer further w/u and management to medicine service.  [name] will see later this afternoon.   For questions or updates, please contact [place] Please consult [email or web address] for contact info under    Signed, Baldwin Jamaica, PA-C  01/15/2020 11:23 AM  EP Attending  Patient seen and examined. She is well known to me and has a h/o syncope both before and after her PPM insertion. She does have noise on both the atrial and ventricular leads and has been programmed around. By history, her syncope is not consistent with a PM malfunction nor an atrial or ventricular arrhythmia. We have adjusted the programmed sensitivity of her ventricular lead. No other cardiac recs at this  time. She can be hydrated and discharged home.   Mikle Bosworth.D.

## 2020-01-15 NOTE — Progress Notes (Signed)
PT Cancellation Note  Patient Details Name: [name] MRN: [id number] DOB: [birth year]   Cancelled Treatment:    Reason Eval/Treat Not Completed: Other (comment) patient still extremely confused and restless, RN requests hold for now due to patient behaviors. Will continue to follow acutely and initiate eval when appropriate.    Windell Norfolk, DPT, PN1   Supplemental Physical Therapist [facility]    Pager [phone] Acute Rehab Office [phone]

## 2020-01-15 NOTE — ED Notes (Addendum)
Skin and needs for mitten applied to hands assessed. Skin WDL. Pt. Currently still needs mittens for hands. Nurse will continue to monitor

## 2020-01-15 NOTE — ED Notes (Signed)
Lunch Tray Ordered @ 1027. °

## 2020-01-15 NOTE — ED Notes (Signed)
Pt attempting to get out of bed, pt assisted to sit back in bed and was informed that for her safety she should remain in bed and that she cannot walk around the ED. Pt was connected to remote telemetry monitor.

## 2020-01-15 NOTE — Progress Notes (Signed)
  Echocardiogram 2D Echocardiogram has been performed.  [name] 01/15/2020, 4:08 PM

## 2020-01-15 NOTE — ED Notes (Signed)
2.5mg  Haldol given IV per Tawanna Solo

## 2020-01-15 NOTE — ED Notes (Signed)
Purewick and disposable brief placed on pt by this NT and RN [employer]

## 2020-01-15 NOTE — ED Notes (Signed)
Pt swinging at staff while brief being changed. Security present to room. Doctor called for orders

## 2020-01-15 NOTE — ED Notes (Signed)
Bilateral wrist restraints removed to allow pt to eat. Pt attempting to climb out of bed, pull at lines and tele equipment. Restraints reapplied.

## 2020-01-15 NOTE — Plan of Care (Signed)

## 2020-01-15 NOTE — ED Notes (Signed)
Pt's bracelet removed from wrist by this NT. Bracelet placed in a cup that is labeled with patient ID label sticker. Cup has been placed on the bedside table within the pt's exam room. RN Mackey Birchwood present in the exam room and witnessed this NT placing the contained and labeled bracelet on the bedside table.

## 2020-01-15 NOTE — ED Notes (Signed)
Nurse will administer Donepezil once delivered by pharmacy.

## 2020-01-15 NOTE — ED Notes (Signed)
Pt got weak when she tried to stand during bed change, assisted back to bed

## 2020-01-15 NOTE — Progress Notes (Addendum)
PROGRESS NOTE    [name]  GYK:[id number] DOB: 1934-12-24 DOA: 01/14/2020 PCP: Elby Showers, MD   Brief Narrative: Patient is a 85 year old female with history of chronic heart block status post pacemaker placement , A. fib on Eliquis, dementia who presents to the emergency room with syncopal episode at home.  Patient had a witnessed syncopal event at home after having lunch.  As per the husband, she fell over the floor.  She was unresponsive for approximately 1 minute.  No seizure-like activity.  CT head did not show any acute intracranial abnormality, showed progressive atrophic changes, mild ventriculomegaly.  Patient was admitted for the evaluation of syncope.  Assessment & Plan:   Principal Problem:   Syncope Active Problems:   Pacemaker   Dementia arising in the senium and presenium ([place])   Paroxysmal atrial fibrillation ([place])   Syncopal event: We will check orthostatic vitals, echocardiogram.  Monitor on telemetry.  We will interrogate the pacemaker.  Patient has Kimmswick pacemaker. TSH is normal.  Currently she is hemodynamically stable. We will do a physical therapy assessment when possible.  Paroxysmal A. fib: On Eliquis for anticoagulation.  Currently rate is controlled.  Dementia: Severe dementia at baseline.  Confused at baseline.  Continue home medication, continue supportive care.  Avoid benzodiazepines as much as possible.  Hypothyroidism: On levothyroxine at home.  Anxiety: On as needed Ativan.         DVT prophylaxis:Eliquis Code Status: Full Family Communication: Discussed with husband on phone on 01/15/20 Status is: Observation  The patient remains OBS appropriate and will d/c before 2 midnights.  Dispo: The patient is from: Home              Anticipated d/c is to: Home vs SNF              Anticipated d/c date is: 2 days              Patient currently is not medically stable to d/c.    Consultants: None Procedures:None  Antimicrobials:    Anti-infectives (From admission, onward)   None      Subjective: Patient seen and examined at the bedside this morning in the emergency department.  She is currently hemodynamically stable.  She was sleeping  and did not open her eyes on calling her name.  On baseline she has severe dementia.  She was comfortable during my evaluation.  Objective: Vitals:   01/15/20 0758 01/15/20 0815 01/15/20 0845 01/15/20 0900  BP: (!) 120/57 (!) 111/59 (!) 126/58 (!) 120/62  Pulse: 61 58 72 59  Resp: 15     Temp:      TempSrc:      SpO2: 100% 100% 100% 97%   No intake or output data in the 24 hours ending 01/15/20 0906 There were no vitals filed for this visit.  Examination:  General exam: Elderly deconditioned, debilitated female Respiratory system: Bilateral equal air entry, normal vesicular breath sounds, no wheezes or crackles  Cardiovascular system: S1 & S2 heard, RRR. No JVD, murmurs, rubs, gallops or clicks. No pedal edema. Gastrointestinal system: Abdomen is nondistended, soft and nontender. No organomegaly or masses felt. Normal bowel sounds heard. Central nervous system: Not alert or oriented. Extremities: No edema, no clubbing ,no cyanosis Skin: No rashes, lesions or ulcers,no icterus ,no pallor   Data Reviewed: I have personally reviewed following labs and imaging studies  CBC: Recent Labs  Lab 01/14/20 2138 01/15/20 0316  WBC 8.0 7.0  NEUTROABS  5.4  --   HGB 13.8 14.1  HCT 42.7 42.6  MCV 96.4 95.9  PLT 193 681   Basic Metabolic Panel: Recent Labs  Lab 01/14/20 1449 01/15/20 0316  NA 140 139  K 3.8 3.7  CL 105 109  CO2 27 24  GLUCOSE 131* 87  BUN 24* 22  CREATININE 1.34* 1.00  CALCIUM 9.0 8.3*   GFR: CrCl cannot be calculated (Unknown ideal weight.). Liver Function Tests: Recent Labs  Lab 01/14/20 1449  AST 22  ALT 14  ALKPHOS 52  BILITOT 0.7  PROT 6.7  ALBUMIN 3.7   No results for input(s): LIPASE, AMYLASE in the last 168 hours. No results for  input(s): AMMONIA in the last 168 hours. Coagulation Profile: Recent Labs  Lab 01/14/20 1449  INR 1.1   Cardiac Enzymes: No results for input(s): CKTOTAL, CKMB, CKMBINDEX, TROPONINI in the last 168 hours. BNP (last 3 results) No results for input(s): PROBNP in the last 8760 hours. HbA1C: No results for input(s): HGBA1C in the last 72 hours. CBG: Recent Labs  Lab 01/14/20 1430 01/15/20 0652  GLUCAP 169* 78   Lipid Profile: No results for input(s): CHOL, HDL, LDLCALC, TRIG, CHOLHDL, LDLDIRECT in the last 72 hours. Thyroid Function Tests: Recent Labs    01/14/20 2226  TSH 1.620   Anemia Panel: No results for input(s): VITAMINB12, FOLATE, FERRITIN, TIBC, IRON, RETICCTPCT in the last 72 hours. Sepsis Labs: Recent Labs  Lab 01/14/20 1449  LATICACIDVEN 1.9    Recent Results (from the past 240 hour(s))  SARS Coronavirus 2 by RT PCR (hospital order, performed in [facility] hospital lab) Nasopharyngeal Nasopharyngeal Swab     Status: None   Collection Time: 01/14/20  7:20 PM   Specimen: Nasopharyngeal Swab  Result Value Ref Range Status   SARS Coronavirus 2 NEGATIVE NEGATIVE Final    Comment: (NOTE) SARS-CoV-2 target nucleic acids are NOT DETECTED.  The SARS-CoV-2 RNA is generally detectable in upper and lower respiratory specimens during the acute phase of infection. The lowest concentration of SARS-CoV-2 viral copies this assay can detect is 250 copies / mL. A negative result does not preclude SARS-CoV-2 infection and should not be used as the sole basis for treatment or other patient management decisions.  A negative result may occur with improper specimen collection / handling, submission of specimen other than nasopharyngeal swab, presence of viral mutation(s) within the areas targeted by this assay, and inadequate number of viral copies (<250 copies / mL). A negative result must be combined with clinical observations, patient history, and epidemiological  information.  Fact Sheet for Patients:   StrictlyIdeas.no  Fact Sheet for Healthcare Providers: BankingDealers.co.za  This test is not yet approved or  cleared by the [place] FDA and has been authorized for detection and/or diagnosis of SARS-CoV-2 by FDA under an Emergency Use Authorization (EUA).  This EUA will remain in effect (meaning this test can be used) for the duration of the COVID-19 declaration under Section 564(b)(1) of the Act, 21 U.S.C. section 360bbb-3(b)(1), unless the authorization is terminated or revoked sooner.  Performed at [facility], [place] [address], [place], [place] [address]          Radiology Studies: CT Head Wo Contrast  Result Date: 01/14/2020 CLINICAL DATA:  Syncope EXAM: CT HEAD WITHOUT CONTRAST TECHNIQUE: Contiguous axial images were obtained from the base of the skull through the vertex without intravenous contrast. COMPARISON:  04/12/2013 FINDINGS: Brain: There is normal anatomic configuration of the brain.  There is mild to moderate parenchymal atrophy, which has progressed since prior examination, but appears commensurate with the patient's age. There is mild ventriculomegaly with particular enlargement of the temporal horns of the lateral ventricles. While this may reflect the sequela of central atrophy, normal pressure hydrocephalus could appear in similar fashion. There is no evidence of acute intracranial hemorrhage or infarct. No abnormal mass effect or midline shift. No abnormal intra or extra-axial mass lesion or fluid collection Vascular: Unremarkable Skull: Intact Sinuses/Orbits: Paranasal sinuses are clear. Orbits are unremarkable. Other: Mastoid air cells and middle ear cavities are clear. IMPRESSION: No acute intracranial hemorrhage or infarct. Progressive atrophic change. Mild ventriculomegaly may reflect the sequela of central atrophy, however, clinical correlation for signs and  symptoms of normal pressure hydrocephalus may be helpful. Electronically Signed   By: Fidela Salisbury MD   On: 01/14/2020 18:22   DG Chest Port 1 View  Result Date: 01/14/2020 CLINICAL DATA:  Syncope EXAM: PORTABLE CHEST 1 VIEW COMPARISON:  01/02/2015 FINDINGS: The heart size and mediastinal contours are within normal limits. Left chest multi lead pacer. Both lungs are clear. The visualized skeletal structures are unremarkable. IMPRESSION: No acute abnormality of the lungs in portable projection. Electronically Signed   By: Eddie Candle M.D.   On: 01/14/2020 15:14        Scheduled Meds: . apixaban  2.5 mg Oral BID  . calcium citrate  950 mg Oral Daily  . cholecalciferol  5,000 Units Oral Daily  . docusate sodium  50 mg Oral Daily  . memantine  28 mg Oral QHS   And  . donepezil  10 mg Oral QHS  . levothyroxine  75 mcg Oral Q0600  . LORazepam  0.5 mg Oral QHS  . sodium chloride flush  3 mL Intravenous Q12H   Continuous Infusions:   LOS: 0 days    Time spent: 35 mins,More than 50% of that time was spent in counseling and/or coordination of care.      Shelly Coss, MD [facility] P7/26/2021, 9:06 AM

## 2020-01-16 DIAGNOSIS — R55 Syncope and collapse: Secondary | ICD-10-CM | POA: Diagnosis not present

## 2020-01-16 LAB — URINALYSIS, ROUTINE W REFLEX MICROSCOPIC
Bilirubin Urine: NEGATIVE
Glucose, UA: NEGATIVE mg/dL
Ketones, ur: 20 mg/dL — AB
Nitrite: POSITIVE — AB
Protein, ur: NEGATIVE mg/dL
Specific Gravity, Urine: 1.025 (ref 1.005–1.030)
pH: 5 (ref 5.0–8.0)

## 2020-01-16 LAB — GLUCOSE, CAPILLARY: Glucose-Capillary: 70 mg/dL (ref 70–99)

## 2020-01-16 MED FILL — Calcium Citrate Tab 950 MG (200 MG Elemental Ca): ORAL | Qty: 1 | Status: AC

## 2020-01-16 NOTE — Progress Notes (Signed)
PROGRESS NOTE    [name]  MWU:[id number] DOB: 03-02-1935 DOA: 01/14/2020 PCP: Elby Showers, MD   Brief Narrative: Patient is a 85 year old female with history of chronic heart block status post pacemaker placement , A. fib on Eliquis, dementia who presents to the emergency room with syncopal episode at home.  Patient had a witnessed syncopal event at home after having lunch.  As per the husband, she fell over the floor.  She was unresponsive for approximately 1 minute.  No seizure-like activity.  CT head did not show any acute intracranial abnormality, showed progressive atrophic changes, mild ventriculomegaly.  Patient was admitted for the evaluation of syncope.  Currently she remains hemodynamically stable.  Hospital course remarkable for persistent agitation, confusion, delirium.  PT recommended 1 more night stay for evaluation tomorrow morning.  Discharge planning tomorrow to home.  Assessment & Plan:   Principal Problem:   Syncope Active Problems:   Pacemaker   Dementia arising in the senium and presenium ([place])   Paroxysmal atrial fibrillation ([place])   Syncopal event: Interrogated the pacemaker without any finding that needs attention.  Patient has Norris pacemaker. TSH is normal.  Currently she is hemodynamically stable. Echocardiogram has been done during this hospitalization showed EF of 55 to 60%, no gross valvular abnormalities. PT recommended 1 more overnight to stay for evaluation tomorrow.  Cardiology has cleared her  for discharge  Paroxysmal A. fib: On Eliquis for anticoagulation.  Currently rate is controlled.  Dementia: Severe dementia at baseline.  Confused at baseline.  Continue home medication, continue supportive care.  Avoid benzodiazepines as much as possible.  Start on low-dose Seroquel.  IV Haldol for severe agitation.  Hypothyroidism: On levothyroxine at home.  Anxiety: On as needed Ativan.         DVT prophylaxis:Eliquis Code Status:  Full Family Communication: Discussed with husband on phone on 01/15/20 Status is: inpatient Dispo: The patient is from: Home              Anticipated d/c is to: Home               Anticipated d/c date is:1 day              Patient currently is not medically stable to d/c.    Consultants: None Procedures:None  Antimicrobials:  Anti-infectives (From admission, onward)   None      Subjective: Patient seen and examined at the bedside this morning.  Hemodynamically stable.  She was being cleaned.  She looked alert, awake and cooperative during my evaluation but was confused. Objective: Vitals:   01/15/20 2037 01/16/20 0500 01/16/20 0745 01/16/20 1429  BP: 117/71  120/73 120/66  Pulse: 74  65 93  Resp: 15  14 17   Temp: 97.8 F (36.6 C)  97.8 F (36.6 C) (!) 97.5 F (36.4 C)  TempSrc: Oral  Oral Oral  SpO2: 98%  94% 95%  Weight:  62.9 kg    Height:  5\' 9"  (1.753 m)      Intake/Output Summary (Last 24 hours) at 01/16/2020 1443 Last data filed at 01/16/2020 0900 Gross per 24 hour  Intake 360 ml  Output --  Net 360 ml   Filed Weights   01/16/20 0500  Weight: 62.9 kg    Examination:   General exam: Deconditioned, debilitated female, confused Respiratory system: Bilateral equal air entry, normal vesicular breath sounds, no wheezes or crackles  Cardiovascular system: S1 & S2 heard, RRR. No JVD, murmurs, rubs, gallops  or clicks. Gastrointestinal system: Abdomen is nondistended, soft and nontender. No organomegaly or masses felt. Normal bowel sounds heard. Central nervous system: Alert and awake but not oriented. No focal neurological deficits. Extremities: No edema, no clubbing ,no cyanosis, distal peripheral pulses palpable. Skin: No rashes, lesions or ulcers,no icterus ,no pallor    Data Reviewed: I have personally reviewed following labs and imaging studies  CBC: Recent Labs  Lab 01/14/20 2138 01/15/20 0316  WBC 8.0 7.0  NEUTROABS 5.4  --   HGB 13.8 14.1  HCT  42.7 42.6  MCV 96.4 95.9  PLT 193 284   Basic Metabolic Panel: Recent Labs  Lab 01/14/20 1449 01/15/20 0316  NA 140 139  K 3.8 3.7  CL 105 109  CO2 27 24  GLUCOSE 131* 87  BUN 24* 22  CREATININE 1.34* 1.00  CALCIUM 9.0 8.3*   GFR: Estimated Creatinine Clearance: 42.3 mL/min (by C-G formula based on SCr of 1 mg/dL). Liver Function Tests: Recent Labs  Lab 01/14/20 1449  AST 22  ALT 14  ALKPHOS 52  BILITOT 0.7  PROT 6.7  ALBUMIN 3.7   No results for input(s): LIPASE, AMYLASE in the last 168 hours. No results for input(s): AMMONIA in the last 168 hours. Coagulation Profile: Recent Labs  Lab 01/14/20 1449  INR 1.1   Cardiac Enzymes: No results for input(s): CKTOTAL, CKMB, CKMBINDEX, TROPONINI in the last 168 hours. BNP (last 3 results) No results for input(s): PROBNP in the last 8760 hours. HbA1C: No results for input(s): HGBA1C in the last 72 hours. CBG: Recent Labs  Lab 01/14/20 1430 01/15/20 0652 01/16/20 0758  GLUCAP 169* 78 70   Lipid Profile: No results for input(s): CHOL, HDL, LDLCALC, TRIG, CHOLHDL, LDLDIRECT in the last 72 hours. Thyroid Function Tests: Recent Labs    01/14/20 2226  TSH 1.620   Anemia Panel: No results for input(s): VITAMINB12, FOLATE, FERRITIN, TIBC, IRON, RETICCTPCT in the last 72 hours. Sepsis Labs: Recent Labs  Lab 01/14/20 1449  LATICACIDVEN 1.9    Recent Results (from the past 240 hour(s))  SARS Coronavirus 2 by RT PCR (hospital order, performed in [facility] hospital lab) Nasopharyngeal Nasopharyngeal Swab     Status: None   Collection Time: 01/14/20  7:20 PM   Specimen: Nasopharyngeal Swab  Result Value Ref Range Status   SARS Coronavirus 2 NEGATIVE NEGATIVE Final    Comment: (NOTE) SARS-CoV-2 target nucleic acids are NOT DETECTED.  The SARS-CoV-2 RNA is generally detectable in upper and lower respiratory specimens during the acute phase of infection. The lowest concentration of SARS-CoV-2 viral copies this  assay can detect is 250 copies / mL. A negative result does not preclude SARS-CoV-2 infection and should not be used as the sole basis for treatment or other patient management decisions.  A negative result may occur with improper specimen collection / handling, submission of specimen other than nasopharyngeal swab, presence of viral mutation(s) within the areas targeted by this assay, and inadequate number of viral copies (<250 copies / mL). A negative result must be combined with clinical observations, patient history, and epidemiological information.  Fact Sheet for Patients:   StrictlyIdeas.no  Fact Sheet for Healthcare Providers: BankingDealers.co.za  This test is not yet approved or  cleared by the [place] FDA and has been authorized for detection and/or diagnosis of SARS-CoV-2 by FDA under an Emergency Use Authorization (EUA).  This EUA will remain in effect (meaning this test can be used) for the duration of the COVID-19  declaration under Section 564(b)(1) of the Act, 21 U.S.C. section 360bbb-3(b)(1), unless the authorization is terminated or revoked sooner.  Performed at [facility], [place] [address], [place], [place] [address]          Radiology Studies: CT Head Wo Contrast  Result Date: 01/14/2020 CLINICAL DATA:  Syncope EXAM: CT HEAD WITHOUT CONTRAST TECHNIQUE: Contiguous axial images were obtained from the base of the skull through the vertex without intravenous contrast. COMPARISON:  04/12/2013 FINDINGS: Brain: There is normal anatomic configuration of the brain. There is mild to moderate parenchymal atrophy, which has progressed since prior examination, but appears commensurate with the patient's age. There is mild ventriculomegaly with particular enlargement of the temporal horns of the lateral ventricles. While this may reflect the sequela of central atrophy, normal pressure hydrocephalus could appear in  similar fashion. There is no evidence of acute intracranial hemorrhage or infarct. No abnormal mass effect or midline shift. No abnormal intra or extra-axial mass lesion or fluid collection Vascular: Unremarkable Skull: Intact Sinuses/Orbits: Paranasal sinuses are clear. Orbits are unremarkable. Other: Mastoid air cells and middle ear cavities are clear. IMPRESSION: No acute intracranial hemorrhage or infarct. Progressive atrophic change. Mild ventriculomegaly may reflect the sequela of central atrophy, however, clinical correlation for signs and symptoms of normal pressure hydrocephalus may be helpful. Electronically Signed   By: Fidela Salisbury MD   On: 01/14/2020 18:22   ECHOCARDIOGRAM COMPLETE  Result Date: 01/15/2020    ECHOCARDIOGRAM REPORT   Patient Name:   Lucinda Elly Modena Date of Exam: 01/15/2020 Medical Rec #:  [id number]   Height:       69.0 in Accession #:    [id number]  Weight:       138.0 lb Date of Birth:  [birth year]   BSA:          1.765 m Patient Age:    23 years    BP:           112/64 mmHg Patient Gender: F           HR:           65 bpm. Exam Location:  Inpatient Procedure: 2D Echo Indications:    syncope 780.2  History:        Patient has prior history of Echocardiogram examinations, most                 recent 11/29/2012. Pacemaker; Arrythmias:Atrial Fibrillation.  Sonographer:    Johny Chess Referring Phys: [phone] JARED M GARDNER  Sonographer Comments: Image acquisition challenging due to uncooperative patient. IMPRESSIONS  1. Left ventricular ejection fraction, by estimation, is 55 to 60%. The left ventricle has normal function. The left ventricle has no regional wall motion abnormalities. Left ventricular diastolic parameters are indeterminate.  2. Right ventricular systolic function is normal. The right ventricular size is mildly enlarged. Tricuspid regurgitation signal is inadequate for assessing PA pressure.  3. The mitral valve is grossly normal. No evidence of mitral valve regurgitation.  No evidence of mitral stenosis.  4. The aortic valve is tricuspid. Aortic valve regurgitation is not visualized. No aortic stenosis is present.  5. The inferior vena cava is normal in size with greater than 50% respiratory variability, suggesting right atrial pressure of 3 mmHg. FINDINGS  Left Ventricle: Left ventricular ejection fraction, by estimation, is 55 to 60%. The left ventricle has normal function. The left ventricle has no regional wall motion abnormalities. The left ventricular internal cavity size was normal in size. There  is  no left ventricular hypertrophy. Left ventricular diastolic parameters are indeterminate. Right Ventricle: The right ventricular size is mildly enlarged. No increase in right ventricular wall thickness. Right ventricular systolic function is normal. Tricuspid regurgitation signal is inadequate for assessing PA pressure. Left Atrium: Left atrial size was normal in size. Right Atrium: Right atrial size was normal in size. Pericardium: There is no evidence of pericardial effusion. Mitral Valve: The mitral valve is grossly normal. There is mild calcification of the anterior and posterior mitral valve leaflet(s). No evidence of mitral valve regurgitation. No evidence of mitral valve stenosis. Tricuspid Valve: The tricuspid valve is grossly normal. Tricuspid valve regurgitation is not demonstrated. No evidence of tricuspid stenosis. Aortic Valve: The aortic valve is tricuspid. Aortic valve regurgitation is not visualized. No aortic stenosis is present. Pulmonic Valve: The pulmonic valve was grossly normal. Pulmonic valve regurgitation is not visualized. No evidence of pulmonic stenosis. Aorta: The aortic root is normal in size and structure. Venous: The inferior vena cava is normal in size with greater than 50% respiratory variability, suggesting right atrial pressure of 3 mmHg. IAS/Shunts: The atrial septum is grossly normal. Additional Comments: A pacer wire is visualized in the right  ventricle and right atrium.  LEFT VENTRICLE PLAX 2D LVIDd:         4.00 cm     Diastology LVIDs:         2.50 cm     LV e' lateral:   9.46 cm/s LV PW:         0.80 cm     LV E/e' lateral: 6.7 LV IVS:        0.80 cm     LV e' medial:    7.29 cm/s LVOT diam:     2.10 cm     LV E/e' medial:  8.7 LV SV:         43 LV SV Index:   24 LVOT Area:     3.46 cm  LV Volumes (MOD) LV vol d, MOD A4C: 50.0 ml LV vol s, MOD A4C: 27.2 ml LV SV MOD A4C:     50.0 ml RIGHT VENTRICLE             IVC RV S prime:     11.30 cm/s  IVC diam: 1.70 cm TAPSE (M-mode): 1.4 cm LEFT ATRIUM             Index       RIGHT ATRIUM           Index LA Vol (A2C):   33.6 ml 19.04 ml/m RA Area:     13.50 cm LA Vol (A4C):   29.2 ml 16.55 ml/m RA Volume:   32.40 ml  18.36 ml/m LA Biplane Vol: 33.2 ml 18.81 ml/m  AORTIC VALVE LVOT Vmax:   62.70 cm/s LVOT Vmean:  42.600 cm/s LVOT VTI:    0.124 m  AORTA Ao Root diam: 3.20 cm Ao Asc diam:  2.60 cm MITRAL VALVE MV Area (PHT): 2.76 cm    SHUNTS MV Decel Time: 275 msec    Systemic VTI:  0.12 m MV E velocity: 63.40 cm/s  Systemic Diam: 2.10 cm MV A velocity: 57.40 cm/s MV E/A ratio:  1.10 Eleonore Chiquito MD Electronically signed by Eleonore Chiquito MD Signature Date/Time: 01/15/2020/5:27:59 PM    Final         Scheduled Meds: . apixaban  2.5 mg Oral BID  . calcium citrate  950 mg Oral Daily  . cholecalciferol  5,000 Units Oral Daily  . docusate sodium  50 mg Oral Daily  . memantine  28 mg Oral QHS   And  . donepezil  10 mg Oral QHS  . levothyroxine  75 mcg Oral Q0600  . LORazepam  0.5 mg Oral QHS  . QUEtiapine  25 mg Oral QHS  . sodium chloride flush  3 mL Intravenous Q12H   Continuous Infusions:   LOS: 1 day    Time spent: 25 mins,More than 50% of that time was spent in counseling and/or coordination of care.      Shelly Coss, MD [facility] P7/27/2021, 2:43 PM

## 2020-01-16 NOTE — Progress Notes (Addendum)
0900 Pt is calm, oriented to self, cooperative. Removed wrist restraints for breakfast. 0945 Morning care and bed bath was done by NT but pt tried to swing at staff and trying to climb out of bed. Bilateral wrist restraints resumed. 87 Pt's spouse and daughter came in, pt is calm. Bil soft wrist restraints released.  1400 Pt is agitated, trying to hit staff. Trying to get up to go home. Pt's husband and daughter is present but does not know them. Soft wrist restraints reapplied. Dr Tawanna Solo notified.

## 2020-01-16 NOTE — Evaluation (Signed)
Physical Therapy Evaluation Patient Details Name: [name] MRN: [id number] DOB: 10-01-1934 Today's Date: 01/16/2020   History of Present Illness  [name] is a 85 y.o. female with medical history significant of chronic heart block s/p pacer placement, A.Fib on eliquis, dementia. Pt presents to ED after having a witnessed syncopal event at home after lunch. CT head neg for acute event  Clinical Impression  Pt admitted with above diagnosis. Pt presents with fatigue and trembling on exertion that her daughter reports is not her baseline, possibly from meds received in the night? Needed min A for bed mobility and transfers. Pt ambulated 6' with RW, then HHA as she is not familiar with using and AD but became very fatigued after only 4'. Would benefit from another PT session tomorrow before d/c home. Family to bring caregivers in to give husband additional support and recommend HHPT.  Pt currently with functional limitations due to the deficits listed below (see PT Problem List). Pt will benefit from skilled PT to increase their independence and safety with mobility to allow discharge to the venue listed below.       Follow Up Recommendations Home health PT;Supervision/Assistance - 24 hour    Equipment Recommendations  None recommended by PT    Recommendations for Other Services       Precautions / Restrictions Precautions Precautions: Fall Restrictions Weight Bearing Restrictions: No      Mobility  Bed Mobility Overal bed mobility: Needs Assistance             General bed mobility comments: pt on BSC upon PT arrival, RN reports needed min A  Transfers Overall transfer level: Needs assistance Equipment used: Rolling walker (2 wheeled);None Transfers: Sit to/from Stand Sit to Stand: Mod assist         General transfer comment: mod A for power up with and without RW. Pt with trembling in UE's and LE's that daughter reports is not usual for her. Possibly from meds she  received last night?  Ambulation/Gait Ambulation/Gait assistance: Min assist Gait Distance (Feet): 6 Feet Assistive device: Rolling walker (2 wheeled);1 person hand held assist Gait Pattern/deviations: Step-through pattern;Trunk flexed Gait velocity: decreased Gait velocity interpretation: <1.31 ft/sec, indicative of household ambulator General Gait Details: family states that they would like pt to use RW but she has not in the past and it was obvious that it was not familiar to her. Attempted to have her use it to start but she kept leaving it, HHA given at that point. Min A to begin ambulation but after 4' pt's LE's very weak and pt verbalizing that she felt fatigued and needed to sit.   Stairs            Wheelchair Mobility    Modified Rankin (Stroke Patients Only)       Balance Overall balance assessment: Needs assistance Sitting-balance support: No upper extremity supported;Feet supported Sitting balance-Leahy Scale: Good     Standing balance support: Single extremity supported Standing balance-Leahy Scale: Poor Standing balance comment: unsteady in standing, needs UE support                             Pertinent Vitals/Pain Pain Assessment: Faces Faces Pain Scale: No hurt    Home Living Family/patient expects to be discharged to:: Private residence Living Arrangements: Spouse/significant other Available Help at Discharge: Family;Available 24 hours/day Type of Home: House Home Access: Stairs to enter Entrance Stairs-Rails: Right;Left Entrance  Stairs-Number of Steps: 4 Home Layout: One level Home Equipment: [place] - 2 wheels;Cane - single point Additional Comments: husband home with pt 24/7 but he is 35 yo. Daughter states that they have had caregiver come in in the past and will do so again this time    Prior Function Level of Independence: Needs assistance   Gait / Transfers Assistance Needed: ambulates without AD  ADL's / Homemaking  Assistance Needed: needs assist with home mgmt due to dementia        Hand Dominance        Extremity/Trunk Assessment   Upper Extremity Assessment Upper Extremity Assessment: Overall WFL for tasks assessed    Lower Extremity Assessment Lower Extremity Assessment: Generalized weakness    Cervical / Trunk Assessment Cervical / Trunk Assessment: Kyphotic  Communication   Communication: No difficulties  Cognition Arousal/Alertness: Awake/alert Behavior During Therapy: Restless Overall Cognitive Status: History of cognitive impairments - at baseline                                 General Comments: pt mildly more agitated than her normal per family. Pt likes to always have toilet paper in her hands to fold      General Comments General comments (skin integrity, edema, etc.): pt's daughter relays that they can have caregivers come into home. Given pt's dementia, feel that pt's best d/c plan is home with care coming in as opposed to SNF    Exercises     Assessment/Plan    PT Assessment Patient needs continued PT services  PT Problem List Decreased strength;Decreased activity tolerance;Decreased balance;Decreased mobility;Decreased cognition;Decreased knowledge of use of DME;Decreased safety awareness;Decreased knowledge of precautions       PT Treatment Interventions DME instruction;Gait training;Stair training;Functional mobility training;Therapeutic activities;Therapeutic exercise;Balance training;Cognitive remediation;Patient/family education    PT Goals (Current goals can be found in the Care Plan section)  Acute Rehab PT Goals Patient Stated Goal: pt did not state, family would like for her to return home PT Goal Formulation: With patient/family Time For Goal Achievement: 01/30/20 Potential to Achieve Goals: Good    Frequency Min 3X/week   Barriers to discharge        Co-evaluation               AM-PAC PT "6 Clicks" Mobility  Outcome  Measure Help needed turning from your back to your side while in a flat bed without using bedrails?: None Help needed moving from lying on your back to sitting on the side of a flat bed without using bedrails?: None Help needed moving to and from a bed to a chair (including a wheelchair)?: A Little Help needed standing up from a chair using your arms (e.g., wheelchair or bedside chair)?: A Little Help needed to walk in hospital room?: A Little Help needed climbing 3-5 steps with a railing? : A Lot 6 Click Score: 19    End of Session Equipment Utilized During Treatment: Gait belt Activity Tolerance: Patient limited by fatigue Patient left: in chair;with call bell/phone within reach;with chair alarm set;with family/visitor present Nurse Communication: Mobility status PT Visit Diagnosis: Unsteadiness on feet (R26.81);Muscle weakness (generalized) (M62.81);Difficulty in walking, not elsewhere classified (R26.2)    Time: 0973-5329 PT Time Calculation (min) (ACUTE ONLY): 25 min   Charges:   PT Evaluation $PT Eval Moderate Complexity: 1 Mod PT Treatments $Gait Training: 8-22 mins        [place],  PT  Acute Rehab Services  Pager [phone] Office [place] 01/16/2020, 3:19 PM

## 2020-01-16 NOTE — Plan of Care (Signed)
  Problem: Education: Goal: Knowledge of General Education information will improve Description: Including pain rating scale, medication(s)/side effects and non-pharmacologic comfort measures Outcome: Not Progressing   Problem: Health Behavior/Discharge Planning: Goal: Ability to manage health-related needs will improve Outcome: Not Progressing   Problem: Activity: Goal: Risk for activity intolerance will decrease Outcome: Progressing   Problem: Nutrition: Goal: Adequate nutrition will be maintained Outcome: Progressing   Problem: Coping: Goal: Level of anxiety will decrease Outcome: Progressing   Problem: Safety: Goal: Ability to remain free from injury will improve Outcome: Progressing

## 2020-01-17 DIAGNOSIS — R55 Syncope and collapse: Secondary | ICD-10-CM | POA: Diagnosis not present

## 2020-01-17 LAB — GLUCOSE, CAPILLARY: Glucose-Capillary: 95 mg/dL (ref 70–99)

## 2020-01-17 NOTE — TOC Initial Note (Signed)
Transition of Care [facility]) - Initial/Assessment Note    Patient Details  Name: [name] MRN: [id number] Date of Birth: [birth year]  [facility] of [facility] CM/SW Contact:    Emeterio Reeve, [place] Phone Number: 01/17/2020, 2:08 PM  Clinical Narrative:                  CSW met with pt at bedside. CSW introduced self and explained her role at the hospital.  PTs daughter and husband and daughter Levada Dy was present. Levada Dy stated that her mother was independent prior to her fall. Pt was able to complete all ADL's.   CSW reviewed pt/ot reccs. PT rec pt do HHPT. Pts daughter stated that she would like her mother to fo to SNF, either [facility] or [facility]. CSW informed pt that PT recc HHPT not SNF. Daughter stated that pts husband is unable to provide her with the care she needs. Daughter stated that they hired aides to come in through Home instead but they wont be able to come for two weeks. Pts husband stated that if insurance wont cover it, he will private pay for those two weeks.   CSW sent secure chat to PT and MD requesting pt be re-evaluated by since she does not have needed supervision at home. Pt states she has had the covid vaccine. Pt prefers [place] or [facility]. PT insurance is managed through [place].   CSW will continue to monitor.   Expected Discharge Plan: [place] Barriers to Discharge: Continued Medical Work up   Patient Goals and CMS Choice Patient states their goals for this hospitalization and ongoing recovery are:: go to rehab then return home CMS Medicare.gov Compare Post Acute Care list provided to:: Patient Choice offered to / list presented to : Patient  Expected Discharge Plan and Services Expected Discharge Plan: [place]       Living arrangements for the past 2 months: Single Family Home                                      Prior Living Arrangements/Services Living arrangements for the past 2 months: Single Family  Home Lives with:: Spouse Patient language and need for interpreter reviewed:: Yes Do you feel safe going back to the place where you live?: Yes      Need for Family Participation in Patient Care: Yes (Comment) Care giver support system in place?: Yes (comment)   Criminal Activity/Legal Involvement Pertinent to Current Situation/Hospitalization: No - Comment as needed  Activities of Daily Living Home Assistive Devices/Equipment: None ADL Screening (condition at time of admission) Patient's cognitive ability adequate to safely complete daily activities?: No Is the patient deaf or have difficulty hearing?: No Does the patient have difficulty seeing, even when wearing glasses/contacts?: No Does the patient have difficulty concentrating, remembering, or making decisions?: Yes Patient able to express need for assistance with ADLs?: No Does the patient have difficulty dressing or bathing?: No Independently performs ADLs?: Yes (appropriate for developmental age) Does the patient have difficulty walking or climbing stairs?: No Weakness of Legs: Both Weakness of Arms/Hands: None  Permission Sought/Granted Permission sought to share information with : Family Supports, Chartered certified accountant granted to share information with : Yes, Verbal Permission Granted  Share Information with NAME: Levada Dy  Permission granted to share info w AGENCY: SNF/ [facility]  Permission granted to share info w Relationship:  Daughter     Emotional Assessment Appearance:: Appears stated age Attitude/Demeanor/Rapport: Engaged Affect (typically observed): Appropriate Orientation: : Oriented to Self, Oriented to Place, Oriented to  Time, Oriented to Situation Alcohol / Substance Use: Not Applicable Psych Involvement: No (comment)  Admission diagnosis:  Syncope and collapse [R55] Syncope [R55] Patient Active Problem List   Diagnosis Date Noted  . Primary osteoarthritis of first carpometacarpal joint of  left hand 09/29/2016  . Alzheimer's disease ([place]) 03/19/2016  . Paroxysmal atrial fibrillation ([place]) 07/10/2015  . Atrial fibrillation ([place]) 04/29/2015  . Closed right hip fracture ([place]) 01/02/2015  . Weight loss, unintentional 02/19/2014  . Dementia arising in the senium and presenium ([place]) 01/30/2014  . 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 Memory loss 07/07/2013  . Memory retention disorder 03/22/2013  . Pacemaker 03/09/2013  . Complete heart block -intermittent   . Syncope 11/18/2012  . Personal history of adenomatous colonic polyps 11/12/2011  . History of TIA (transient ischemic attack) 09/19/2011  . Hypothyroidism 01/13/2011  . Hyperlipidemia 01/13/2011  . First degree AV block 01/13/2011  . Gait disturbance 01/13/2011   PCP:  Elby Showers, MD Pharmacy:   [facility] [place], [place] [address] Phone: [phone] Fax: [phone]     Social Determinants of Health (SDOH) Interventions    Readmission Risk Interventions No flowsheet data found.  Emeterio Reeve, Latanya Presser, [place] Social Worker [phone]

## 2020-01-17 NOTE — Plan of Care (Signed)

## 2020-01-17 NOTE — Progress Notes (Signed)
PROGRESS NOTE    [name]  TML:[id number] DOB: 06-22-1935 DOA: 01/14/2020 PCP: Elby Showers, MD   Brief Narrative: Patient is a 84 year old female with history of chronic heart block status post pacemaker placement , A. fib on Eliquis, dementia who presents to the emergency room with syncopal episode at home.  Patient had a witnessed syncopal event at home after having lunch.  As per the husband, she fell over the floor.  She was unresponsive for approximately 1 minute.  No seizure-like activity.  CT head did not show any acute intracranial abnormality, showed progressive atrophic changes, mild ventriculomegaly.  Patient was admitted for the evaluation of syncope.  Currently she remains hemodynamically stable.  Hospital course remarkable for persistent agitation, confusion, delirium.  She is medically stable for discharge but family requesting for skilled nursing facility placement.  Waiting for PT reevaluation.  Social worker closely following.  Assessment & Plan:   Principal Problem:   Syncope Active Problems:   Pacemaker   Dementia arising in the senium and presenium ([place])   Paroxysmal atrial fibrillation ([place])   Syncopal event: Interrogated the pacemaker without any finding that needs attention.  Patient has [place] pacemaker. TSH is normal.  Currently she is hemodynamically stable. Echocardiogram has been done during this hospitalization showed EF of 55 to 60%, no gross valvular abnormalities.  Paroxysmal A. fib: On Eliquis for anticoagulation.  Currently rate is controlled.  Dementia: Severe dementia at baseline.  Confused at baseline.  Continue home medication, continue supportive care.  Avoid benzodiazepines as much as possible.  Started on low-dose Seroquel.  IV Haldol for severe agitation.  Hypothyroidism: On levothyroxine at home.  Anxiety: On as needed Ativan.  Disposition: Family unable to provide 24/7 care at home.  Aids are available only after 2 weeks.  Family  requesting for skilled nursing facility placement for even short-term.  PT recommended home with home health.  Requested for PT evaluation.  Social worker closely following.         DVT prophylaxis:Eliquis Code Status: Full Family Communication: Discussed with daughter on phone on 01/09/2020. Status is: inpatient Dispo: The patient is from: Home              Anticipated d/c is to: Skilled nursing facility versus home              Anticipated d/c date is:1 day              Patient currently is medically stable for discharge, waiting for disposition Still inpatient because of unsafe discharge plan    Consultants: None Procedures:None  Antimicrobials:  Anti-infectives (From admission, onward)   None      Subjective:  Patient seen and examined at the bedside this morning.  Hemodynamically stable.  Comfortable.  Was in soft restraints but she was not agitated.  She was alert and awake but not oriented.   Objective: Vitals:   01/16/20 1429 01/16/20 2100 01/17/20 0456 01/17/20 0735  BP: 120/66 122/68 (!) 126/60 (!) 124/64  Pulse: 93 91 88 90  Resp: 17 18 19 18   Temp: (!) 97.5 F (36.4 C) 98.1 F (36.7 C) 98.3 F (36.8 C) 98.3 F (36.8 C)  TempSrc: Oral Axillary Axillary Axillary  SpO2: 95% 96% 97% 98%  Weight:   61.9 kg   Height:        Intake/Output Summary (Last 24 hours) at 01/17/2020 1344 Last data filed at 01/17/2020 0900 Gross per 24 hour  Intake 360 ml  Output --  Net 360 ml   Filed Weights   01/16/20 0500 01/17/20 0456  Weight: 62.9 kg 61.9 kg    Examination:   General exam: Not in distress, elderly, deconditioned, debilitated HEENT:PERRL,Oral mucosa moist, Ear/Nose normal on gross exam Respiratory system: Bilateral equal air entry, normal vesicular breath sounds, no wheezes or crackles  Cardiovascular system: S1 & S2 heard, RRR. No JVD, murmurs, rubs, gallops or clicks. Gastrointestinal system: Abdomen is nondistended, soft and nontender. No  organomegaly or masses felt. Normal bowel sounds heard. Central nervous system: Alert and awake but not oriented Extremities: No edema, no clubbing ,no cyanosis Skin: No rashes, lesions or ulcers,no icterus ,no pallor  Data Reviewed: I have personally reviewed following labs and imaging studies  CBC: Recent Labs  Lab 01/14/20 2138 01/15/20 0316  WBC 8.0 7.0  NEUTROABS 5.4  --   HGB 13.8 14.1  HCT 42.7 42.6  MCV 96.4 95.9  PLT 193 885   Basic Metabolic Panel: Recent Labs  Lab 01/14/20 1449 01/15/20 0316  NA 140 139  K 3.8 3.7  CL 105 109  CO2 27 24  GLUCOSE 131* 87  BUN 24* 22  CREATININE 1.34* 1.00  CALCIUM 9.0 8.3*   GFR: Estimated Creatinine Clearance: 41.7 mL/min (by C-G formula based on SCr of 1 mg/dL). Liver Function Tests: Recent Labs  Lab 01/14/20 1449  AST 22  ALT 14  ALKPHOS 52  BILITOT 0.7  PROT 6.7  ALBUMIN 3.7   No results for input(s): LIPASE, AMYLASE in the last 168 hours. No results for input(s): AMMONIA in the last 168 hours. Coagulation Profile: Recent Labs  Lab 01/14/20 1449  INR 1.1   Cardiac Enzymes: No results for input(s): CKTOTAL, CKMB, CKMBINDEX, TROPONINI in the last 168 hours. BNP (last 3 results) No results for input(s): PROBNP in the last 8760 hours. HbA1C: No results for input(s): HGBA1C in the last 72 hours. CBG: Recent Labs  Lab 01/14/20 1430 01/15/20 0652 01/16/20 0758 01/17/20 0434  GLUCAP 169* 78 70 95   Lipid Profile: No results for input(s): CHOL, HDL, LDLCALC, TRIG, CHOLHDL, LDLDIRECT in the last 72 hours. Thyroid Function Tests: Recent Labs    01/14/20 2226  TSH 1.620   Anemia Panel: No results for input(s): VITAMINB12, FOLATE, FERRITIN, TIBC, IRON, RETICCTPCT in the last 72 hours. Sepsis Labs: Recent Labs  Lab 01/14/20 1449  LATICACIDVEN 1.9    Recent Results (from the past 240 hour(s))  SARS Coronavirus 2 by RT PCR (hospital order, performed in [facility] hospital lab) Nasopharyngeal  Nasopharyngeal Swab     Status: None   Collection Time: 01/14/20  7:20 PM   Specimen: Nasopharyngeal Swab  Result Value Ref Range Status   SARS Coronavirus 2 NEGATIVE NEGATIVE Final    Comment: (NOTE) SARS-CoV-2 target nucleic acids are NOT DETECTED.  The SARS-CoV-2 RNA is generally detectable in upper and lower respiratory specimens during the acute phase of infection. The lowest concentration of SARS-CoV-2 viral copies this assay can detect is 250 copies / mL. A negative result does not preclude SARS-CoV-2 infection and should not be used as the sole basis for treatment or other patient management decisions.  A negative result may occur with improper specimen collection / handling, submission of specimen other than nasopharyngeal swab, presence of viral mutation(s) within the areas targeted by this assay, and inadequate number of viral copies (<250 copies / mL). A negative result must be combined with clinical observations, patient history, and epidemiological information.  Fact Sheet for Patients:  StrictlyIdeas.no  Fact Sheet for Healthcare Providers: BankingDealers.co.za  This test is not yet approved or  cleared by the [place] FDA and has been authorized for detection and/or diagnosis of SARS-CoV-2 by FDA under an Emergency Use Authorization (EUA).  This EUA will remain in effect (meaning this test can be used) for the duration of the COVID-19 declaration under Section 564(b)(1) of the Act, 21 U.S.C. section 360bbb-3(b)(1), unless the authorization is terminated or revoked sooner.  Performed at [facility], [place] [address] [place], [place] [address]          Radiology Studies: ECHOCARDIOGRAM COMPLETE  Result Date: 01/15/2020    ECHOCARDIOGRAM REPORT   Patient Name:   Johnnae Elly Modena Date of Exam: 01/15/2020 Medical Rec #:  [id number]   Height:       69.0 in Accession #:    [id number]  Weight:       138.0 lb Date  of Birth:  1934/09/16   BSA:          1.765 m Patient Age:    71 years    BP:           112/64 mmHg Patient Gender: F           HR:           65 bpm. Exam Location:  Inpatient Procedure: 2D Echo Indications:    syncope 780.2  History:        Patient has prior history of Echocardiogram examinations, most                 recent 11/29/2012. Pacemaker; Arrythmias:Atrial Fibrillation.  Sonographer:    Johny Chess Referring Phys: [phone] JARED M GARDNER  Sonographer Comments: Image acquisition challenging due to uncooperative patient. IMPRESSIONS  1. Left ventricular ejection fraction, by estimation, is 55 to 60%. The left ventricle has normal function. The left ventricle has no regional wall motion abnormalities. Left ventricular diastolic parameters are indeterminate.  2. Right ventricular systolic function is normal. The right ventricular size is mildly enlarged. Tricuspid regurgitation signal is inadequate for assessing PA pressure.  3. The mitral valve is grossly normal. No evidence of mitral valve regurgitation. No evidence of mitral stenosis.  4. The aortic valve is tricuspid. Aortic valve regurgitation is not visualized. No aortic stenosis is present.  5. The inferior vena cava is normal in size with greater than 50% respiratory variability, suggesting right atrial pressure of 3 mmHg. FINDINGS  Left Ventricle: Left ventricular ejection fraction, by estimation, is 55 to 60%. The left ventricle has normal function. The left ventricle has no regional wall motion abnormalities. The left ventricular internal cavity size was normal in size. There is  no left ventricular hypertrophy. Left ventricular diastolic parameters are indeterminate. Right Ventricle: The right ventricular size is mildly enlarged. No increase in right ventricular wall thickness. Right ventricular systolic function is normal. Tricuspid regurgitation signal is inadequate for assessing PA pressure. Left Atrium: Left atrial size was normal in size.  Right Atrium: Right atrial size was normal in size. Pericardium: There is no evidence of pericardial effusion. Mitral Valve: The mitral valve is grossly normal. There is mild calcification of the anterior and posterior mitral valve leaflet(s). No evidence of mitral valve regurgitation. No evidence of mitral valve stenosis. Tricuspid Valve: The tricuspid valve is grossly normal. Tricuspid valve regurgitation is not demonstrated. No evidence of tricuspid stenosis. Aortic Valve: The aortic valve is tricuspid. Aortic valve regurgitation is not visualized. No aortic stenosis is present.  Pulmonic Valve: The pulmonic valve was grossly normal. Pulmonic valve regurgitation is not visualized. No evidence of pulmonic stenosis. Aorta: The aortic root is normal in size and structure. Venous: The inferior vena cava is normal in size with greater than 50% respiratory variability, suggesting right atrial pressure of 3 mmHg. IAS/Shunts: The atrial septum is grossly normal. Additional Comments: A pacer wire is visualized in the right ventricle and right atrium.  LEFT VENTRICLE PLAX 2D LVIDd:         4.00 cm     Diastology LVIDs:         2.50 cm     LV e' lateral:   9.46 cm/s LV PW:         0.80 cm     LV E/e' lateral: 6.7 LV IVS:        0.80 cm     LV e' medial:    7.29 cm/s LVOT diam:     2.10 cm     LV E/e' medial:  8.7 LV SV:         43 LV SV Index:   24 LVOT Area:     3.46 cm  LV Volumes (MOD) LV vol d, MOD A4C: 50.0 ml LV vol s, MOD A4C: 27.2 ml LV SV MOD A4C:     50.0 ml RIGHT VENTRICLE             IVC RV S prime:     11.30 cm/s  IVC diam: 1.70 cm TAPSE (M-mode): 1.4 cm LEFT ATRIUM             Index       RIGHT ATRIUM           Index LA Vol (A2C):   33.6 ml 19.04 ml/m RA Area:     13.50 cm LA Vol (A4C):   29.2 ml 16.55 ml/m RA Volume:   32.40 ml  18.36 ml/m LA Biplane Vol: 33.2 ml 18.81 ml/m  AORTIC VALVE LVOT Vmax:   62.70 cm/s LVOT Vmean:  42.600 cm/s LVOT VTI:    0.124 m  AORTA Ao Root diam: 3.20 cm Ao Asc diam:  2.60  cm MITRAL VALVE MV Area (PHT): 2.76 cm    SHUNTS MV Decel Time: 275 msec    Systemic VTI:  0.12 m MV E velocity: 63.40 cm/s  Systemic Diam: 2.10 cm MV A velocity: 57.40 cm/s MV E/A ratio:  1.10 Eleonore Chiquito MD Electronically signed by Eleonore Chiquito MD Signature Date/Time: 01/15/2020/5:27:59 PM    Final         Scheduled Meds: . apixaban  2.5 mg Oral BID  . calcium citrate  950 mg Oral Daily  . cholecalciferol  5,000 Units Oral Daily  . docusate sodium  50 mg Oral Daily  . memantine  28 mg Oral QHS   And  . donepezil  10 mg Oral QHS  . levothyroxine  75 mcg Oral Q0600  . LORazepam  0.5 mg Oral QHS  . QUEtiapine  25 mg Oral QHS  . sodium chloride flush  3 mL Intravenous Q12H   Continuous Infusions:   LOS: 2 days    Time spent: 15 mins,More than 50% of that time was spent in counseling and/or coordination of care.      Shelly Coss, MD [facility] P7/28/2021, 1:44 PM

## 2020-01-17 NOTE — Progress Notes (Signed)
Notified M. Sharlet Salina that order for restraints expired. RN requesting order to be renewed. RN will continue to monitor.

## 2020-01-17 NOTE — Plan of Care (Signed)

## 2020-01-17 NOTE — Progress Notes (Signed)
Physical Therapy Treatment Patient Details Name: [name] MRN: [id number] DOB: [birth year] Today's Date: 01/17/2020    History of Present Illness [name] is a 85 y.o. female with medical history significant of chronic heart block s/p pacer placement, A.Fib on eliquis, dementia. Pt presents to ED after having a witnessed syncopal event at home after lunch. CT head neg for acute event    PT Comments    Pt was seen for mobility to get up to chair but requires mod assist to manage maneuvering with walker.  Pt is struggling with how to advance the walker.  Pt was up in chair and requires mitt to avoid pulling on RUE IV.  Follow up with her to work on standing balance, endurance in standing and LE strength.    Follow Up Recommendations  Home health PT;Supervision for mobility/OOB     Equipment Recommendations  None recommended by PT    Recommendations for Other Services       Precautions / Restrictions Precautions Precautions: Fall Restrictions Weight Bearing Restrictions: No    Mobility  Bed Mobility Overal bed mobility: Needs Assistance Bed Mobility: Supine to Sit     Supine to sit: Mod assist        Transfers Overall transfer level: Needs assistance Equipment used: Rolling walker (2 wheeled);None Transfers: Sit to/from Stand Sit to Stand: Mod assist            Ambulation/Gait Ambulation/Gait assistance: Min assist   Assistive device: Rolling walker (2 wheeled);1 person hand held assist Gait Pattern/deviations: Step-to pattern;Decreased stride length;Wide base of support Gait velocity: decreased   General Gait Details: struggling to understand how to use walker, tries to leave it   Stairs             Wheelchair Mobility    Modified Rankin (Stroke Patients Only)       Balance   Sitting-balance support: Feet supported Sitting balance-Leahy Scale: Fair       Standing balance-Leahy Scale: Poor                               Cognition Arousal/Alertness: Awake/alert Behavior During Therapy: Flat affect Overall Cognitive Status: History of cognitive impairments - at baseline                                 General Comments: attempting to pull on IV site on RUE      Exercises      General Comments General comments (skin integrity, edema, etc.): pt was assisted to the chair and could feed herself      Pertinent Vitals/Pain Pain Assessment: Faces Faces Pain Scale: No hurt    Home Living                      Prior Function            PT Goals (current goals can now be found in the care plan section) Acute Rehab PT Goals Patient Stated Goal: none stated    Frequency    Min 3X/week      PT Plan      Co-evaluation              AM-PAC PT "6 Clicks" Mobility   Outcome Measure  Help needed turning from your back to your side while in a flat bed without using bedrails?:  None Help needed moving from lying on your back to sitting on the side of a flat bed without using bedrails?: None Help needed moving to and from a bed to a chair (including a wheelchair)?: A Little Help needed standing up from a chair using your arms (e.g., wheelchair or bedside chair)?: A Little Help needed to walk in hospital room?: A Little Help needed climbing 3-5 steps with a railing? : A Lot 6 Click Score: 19    End of Session Equipment Utilized During Treatment: Gait belt Activity Tolerance: Patient limited by fatigue Patient left: in chair;with call bell/phone within reach;with chair alarm set;with family/visitor present Nurse Communication: Mobility status PT Visit Diagnosis: Unsteadiness on feet (R26.81);Muscle weakness (generalized) (M62.81);Difficulty in walking, not elsewhere classified (R26.2)     Time: 2353-6144 PT Time Calculation (min) (ACUTE ONLY): 23 min  Charges:  $Gait Training: 8-22 mins $Therapeutic Activity: 8-22 mins                   Ramond Dial 01/17/2020,  11:04 PM  Mee Hives, PT MS Acute Rehab Dept. Number: [place] [facility] [place]

## 2020-01-17 NOTE — NC FL2 (Signed)
[place] MEDICAID FL2 LEVEL OF CARE SCREENING TOOL     IDENTIFICATION  Patient Name: [name] Birthdate: [birth year] Sex: female Admission Date (Current Location): 01/14/2020  [facility] and [place] Number:  Herbalist and Address:  [facility], [place] [address], [place], [place] [address]      Provider Number: [id number]  Attending Physician Name and Address:  Shelly Coss, MD  Relative Name and Phone Number:       Current Level of Care: Hospital Recommended Level of Care: [place] Prior Approval Number:    Date Approved/Denied:   PASRR Number: [id number]  Discharge Plan: SNF    Current Diagnoses: Patient Active Problem List   Diagnosis Date Noted  . Primary osteoarthritis of first carpometacarpal joint of left hand 09/29/2016  . Alzheimer's disease ([place]) 03/19/2016  . Paroxysmal atrial fibrillation ([place]) 07/10/2015  . Atrial fibrillation ([place]) 04/29/2015  . Closed right hip fracture ([place]) 01/02/2015  . Weight loss, unintentional 02/19/2014  . Dementia arising in the senium and presenium ([place]) 01/30/2014  . 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 Memory loss 07/07/2013  . Memory retention disorder 03/22/2013  . Pacemaker 03/09/2013  . Complete heart block -intermittent   . Syncope 11/18/2012  . Personal history of adenomatous colonic polyps 11/12/2011  . History of TIA (transient ischemic attack) 09/19/2011  . Hypothyroidism 01/13/2011  . Hyperlipidemia 01/13/2011  . First degree AV block 01/13/2011  . Gait disturbance 01/13/2011    Orientation RESPIRATION BLADDER Height & Weight     Self  Normal Incontinent Weight: 136 lb 7.4 oz (61.9 kg) Height:  5\' 9"  (175.3 cm)  BEHAVIORAL SYMPTOMS/MOOD NEUROLOGICAL BOWEL NUTRITION STATUS      Incontinent Diet (See discharge summary)  AMBULATORY STATUS COMMUNICATION OF NEEDS Skin   Limited Assist Verbally Skin  abrasions                       Personal Care Assistance Level of Assistance  Bathing, Feeding, Dressing Bathing Assistance: Limited assistance Feeding assistance: Independent Dressing Assistance: Limited assistance     Functional Limitations Info  Speech, Hearing, Sight Sight Info: Adequate Hearing Info: Adequate Speech Info: Adequate    SPECIAL CARE FACTORS FREQUENCY  PT (By licensed PT), OT (By licensed OT)     PT Frequency: 5x a week OT Frequency: 5x  a week            Contractures Contractures Info: Not present    Additional Factors Info  Code Status, Allergies Code Status Info: Full Allergies Info: sulfa           Current Medications (01/17/2020):  This is the current hospital active medication list Current Facility-Administered Medications  Medication Dose Route Frequency Provider Last Rate Last Admin  . acetaminophen (TYLENOL) tablet 650 mg  650 mg Oral Q6H PRN Etta Quill, DO      . apixaban Arne [place]) tablet 2.5 mg  2.5 mg Oral BID Jennette Kettle M, DO   2.5 mg at 01/17/20 0953  . calcium citrate (CALCITRATE - dosed in mg elemental calcium) tablet 950 mg  950 mg Oral Daily Etta Quill, DO   950 mg at 01/17/20 0953  . cholecalciferol (VITAMIN D3) tablet 5,000 Units  5,000 Units Oral Daily Etta Quill, DO   5,000 Units at 01/17/20 [phone]  . docusate sodium (COLACE) capsule 50 mg  50 mg Oral Daily Etta Quill, DO  50 mg at 01/17/20 0953  . memantine (NAMENDA XR) 24 hr capsule 28 mg  28 mg Oral QHS Jennette Kettle M, DO   28 mg at 01/16/20 2258   And  . donepezil (ARICEPT) tablet 10 mg  10 mg Oral QHS Jennette Kettle M, DO   10 mg at 01/16/20 2258  . haloperidol lactate (HALDOL) injection 2 mg  2 mg Intravenous Q6H PRN Shelly Coss, MD   2 mg at 01/16/20 1422  . levothyroxine (SYNTHROID) tablet 75 mcg  75 mcg Oral Q0600 Etta Quill, DO   75 mcg at 01/17/20 0533  . LORazepam (ATIVAN) injection 0.5 mg  0.5 mg Intravenous Q6H PRN  Etta Quill, DO   0.5 mg at 01/15/20 1814  . LORazepam (ATIVAN) tablet 0.5 mg  0.5 mg Oral QHS Jennette Kettle M, DO   0.5 mg at 01/16/20 2258  . QUEtiapine (SEROQUEL) tablet 25 mg  25 mg Oral QHS Shelly Coss, MD   25 mg at 01/16/20 2258  . sodium chloride flush (NS) 0.9 % injection 3 mL  3 mL Intravenous Q12H Jennette Kettle M, DO   3 mL at 01/17/20 0301     Discharge Medications: Please see discharge summary for a list of discharge medications.  Relevant Imaging Results:  Relevant Lab Results:   Additional Information SSN [id number]  Emeterio Reeve, [place]

## 2020-01-17 NOTE — Progress Notes (Signed)
Noted slight redness on L Forearm area. Loosen restraints, completed range of motion and circulation check. Cleansed L forearm area and placed a foam gauze for protection (patient noted with thin skin).   B/L soft restraints renewed, by Dr. Sharlet Salina. Pt with continued confusion: removal of tele monitor multiple times, multiple attempts to get OOB. Pt removes bracelets.   Charge nurse made aware of above finding on L forearm.    Busy Blanket given. Bed exit alarm maintained, frequent verbal contacts made.

## 2020-01-18 ENCOUNTER — Encounter (HOSPITAL_COMMUNITY): Payer: Self-pay | Admitting: Internal Medicine

## 2020-01-18 DIAGNOSIS — R55 Syncope and collapse: Secondary | ICD-10-CM | POA: Diagnosis not present

## 2020-01-18 NOTE — Progress Notes (Signed)
PROGRESS NOTE    [name]  ENI:[id number] DOB: Aug 15, 1934 DOA: 01/14/2020 PCP: Elby Showers, MD   Brief Narrative: Patient is a 85 year old female with history of chronic heart block status post pacemaker placement , A. fib on Eliquis, dementia who presents to the emergency room with syncopal episode at home.  Patient had a witnessed syncopal event at home after having lunch.  As per the husband, she fell over the floor.  She was unresponsive for approximately 1 minute.  No seizure-like activity.  CT head did not show any acute intracranial abnormality, showed progressive atrophic changes, mild ventriculomegaly.  Patient was admitted for the evaluation of syncope.  Currently she remains hemodynamically stable.  Hospital course remarkable for persistent agitation, confusion, delirium.  She is medically stable for discharge to SNF.  Assessment & Plan:   Principal Problem:   Syncope Active Problems:   Pacemaker   Dementia arising in the senium and presenium ([place])   Paroxysmal atrial fibrillation ([place])   Syncopal event: Interrogated the pacemaker without any finding that needs attention.  Patient has Glide pacemaker. TSH is normal.  Currently she is hemodynamically stable. Echocardiogram has been done during this hospitalization showed EF of 55 to 60%, no gross valvular abnormalities.  Paroxysmal A. fib: On Eliquis for anticoagulation.  Currently rate is controlled.  Dementia: Severe dementia at baseline.  Confused at baseline.  Continue home medication, continue supportive care.  Avoid benzodiazepines as much as possible.  Started on low-dose Seroquel.  IV Haldol for severe agitation.  Hypothyroidism: On levothyroxine at home.  Anxiety: On as needed Ativan.  Disposition: Family unable to provide 24/7 care at home.  Aids are available only after 2 weeks.  Family requesting for skilled nursing facility placement .  Social worker closely following.         DVT  prophylaxis:Eliquis Code Status: Full Family Communication: Discussed with husband at bedside Status is: inpatient Dispo: The patient is from: Home              Anticipated d/c is to: Skilled nursing facility               Anticipated d/c date is:1 day              Patient currently is medically stable for discharge, waiting for disposition Still inpatient because of unsafe discharge plan    Consultants: None Procedures:None  Antimicrobials:  Anti-infectives (From admission, onward)   None      Subjective:  Patient seen and examined at the bedside this morning.  Comfortable.  She walked with the physical therapy earlier this morning.  Sleeping during my evaluation.  Husband was at the bedside.  Objective: Vitals:   01/17/20 1925 01/18/20 0312 01/18/20 0600 01/18/20 0900  BP: 126/73 128/73  124/68  Pulse: 85 72  70  Resp: 16 17  17   Temp: 97.7 F (36.5 C) 98.1 F (36.7 C)  98 F (36.7 C)  TempSrc: Oral Oral  Oral  SpO2: 95% 94%  99%  Weight:   62.1 kg   Height:        Intake/Output Summary (Last 24 hours) at 01/18/2020 1359 Last data filed at 01/18/2020 0900 Gross per 24 hour  Intake 360 ml  Output 800 ml  Net -440 ml   Filed Weights   01/16/20 0500 01/17/20 0456 01/18/20 0600  Weight: 62.9 kg 61.9 kg 62.1 kg    Examination:   General exam: Appears calm and comfortable ,Not in distress,  elderly deconditioned female  Respiratory system: Bilateral equal air entry, normal vesicular breath sounds, no wheezes or crackles  Cardiovascular system: S1 & S2 heard, RRR. No JVD, murmurs, rubs, gallops or clicks. Gastrointestinal system: Abdomen is nondistended, soft and nontender. No organomegaly or masses felt. Normal bowel sounds heard. Central nervous system: Not alert or oriented  extremities: No edema, no clubbing ,no cyanosis, Skin: No rashes, lesions or ulcers,no icterus ,no pallor   Data Reviewed: I have personally reviewed following labs and imaging  studies  CBC: Recent Labs  Lab 01/14/20 2138 01/15/20 0316  WBC 8.0 7.0  NEUTROABS 5.4  --   HGB 13.8 14.1  HCT 42.7 42.6  MCV 96.4 95.9  PLT 193 902   Basic Metabolic Panel: Recent Labs  Lab 01/14/20 1449 01/15/20 0316  NA 140 139  K 3.8 3.7  CL 105 109  CO2 27 24  GLUCOSE 131* 87  BUN 24* 22  CREATININE 1.34* 1.00  CALCIUM 9.0 8.3*   GFR: Estimated Creatinine Clearance: 41.8 mL/min (by C-G formula based on SCr of 1 mg/dL). Liver Function Tests: Recent Labs  Lab 01/14/20 1449  AST 22  ALT 14  ALKPHOS 52  BILITOT 0.7  PROT 6.7  ALBUMIN 3.7   No results for input(s): LIPASE, AMYLASE in the last 168 hours. No results for input(s): AMMONIA in the last 168 hours. Coagulation Profile: Recent Labs  Lab 01/14/20 1449  INR 1.1   Cardiac Enzymes: No results for input(s): CKTOTAL, CKMB, CKMBINDEX, TROPONINI in the last 168 hours. BNP (last 3 results) No results for input(s): PROBNP in the last 8760 hours. HbA1C: No results for input(s): HGBA1C in the last 72 hours. CBG: Recent Labs  Lab 01/14/20 1430 01/15/20 0652 01/16/20 0758 01/17/20 0434  GLUCAP 169* 78 70 95   Lipid Profile: No results for input(s): CHOL, HDL, LDLCALC, TRIG, CHOLHDL, LDLDIRECT in the last 72 hours. Thyroid Function Tests: No results for input(s): TSH, T4TOTAL, FREET4, T3FREE, THYROIDAB in the last 72 hours. Anemia Panel: No results for input(s): VITAMINB12, FOLATE, FERRITIN, TIBC, IRON, RETICCTPCT in the last 72 hours. Sepsis Labs: Recent Labs  Lab 01/14/20 1449  LATICACIDVEN 1.9    Recent Results (from the past 240 hour(s))  SARS Coronavirus 2 by RT PCR (hospital order, performed in [facility] hospital lab) Nasopharyngeal Nasopharyngeal Swab     Status: None   Collection Time: 01/14/20  7:20 PM   Specimen: Nasopharyngeal Swab  Result Value Ref Range Status   SARS Coronavirus 2 NEGATIVE NEGATIVE Final    Comment: (NOTE) SARS-CoV-2 target nucleic acids are NOT  DETECTED.  The SARS-CoV-2 RNA is generally detectable in upper and lower respiratory specimens during the acute phase of infection. The lowest concentration of SARS-CoV-2 viral copies this assay can detect is 250 copies / mL. A negative result does not preclude SARS-CoV-2 infection and should not be used as the sole basis for treatment or other patient management decisions.  A negative result may occur with improper specimen collection / handling, submission of specimen other than nasopharyngeal swab, presence of viral mutation(s) within the areas targeted by this assay, and inadequate number of viral copies (<250 copies / mL). A negative result must be combined with clinical observations, patient history, and epidemiological information.  Fact Sheet for Patients:   StrictlyIdeas.no  Fact Sheet for Healthcare Providers: BankingDealers.co.za  This test is not yet approved or  cleared by the [place] FDA and has been authorized for detection and/or diagnosis of SARS-CoV-2 by  FDA under an Emergency Use Authorization (EUA).  This EUA will remain in effect (meaning this test can be used) for the duration of the COVID-19 declaration under Section 564(b)(1) of the Act, 21 U.S.C. section 360bbb-3(b)(1), unless the authorization is terminated or revoked sooner.  Performed at [facility], [place] [address], [place], [place] [address]          Radiology Studies: No results found.      Scheduled Meds: . apixaban  2.5 mg Oral BID  . calcium citrate  950 mg Oral Daily  . cholecalciferol  5,000 Units Oral Daily  . docusate sodium  50 mg Oral Daily  . memantine  28 mg Oral QHS   And  . donepezil  10 mg Oral QHS  . levothyroxine  75 mcg Oral Q0600  . LORazepam  0.5 mg Oral QHS  . QUEtiapine  25 mg Oral QHS  . sodium chloride flush  3 mL Intravenous Q12H   Continuous Infusions:   LOS: 3 days    Time spent: 15 mins,More  than 50% of that time was spent in counseling and/or coordination of care.      Shelly Coss, MD [facility] P7/29/2021, 1:59 PM

## 2020-01-18 NOTE — Progress Notes (Signed)
Nutrition Brief Note  Patient identified on the [place] Report.   Wt Readings from Last 15 Encounters:  01/18/20 62.1 kg  12/12/19 62.6 kg  06/08/19 59.2 kg  05/16/19 59.1 kg  03/16/19 59 kg  11/15/18 59 kg  10/06/18 59 kg  07/04/18 59 kg  06/09/18 57.2 kg  04/20/18 59 kg  03/31/18 60.6 kg  03/08/18 60.8 kg  02/18/18 61.2 kg  10/20/17 61.7 kg  10/04/17 63 kg   Patient is a 85 year old female with history of chronic heart block status post pacemaker placement , A. fib on Eliquis, dementia who presents to the emergency room with syncopal episode at home.  Pt admitted with syncope.  Reviewed I/O's: -440 ml x 24 hours and +280 ml since admission  UOP: 800 ml x 24 hours  SNF  Spoke with pt husband at bedside, who reports pt with good appetite. He assisted her consuming breakfast and she ate 100%. PTA, she consumed 2-3 meals daily. Pt husband reports she eats well, but needs a lot of encouragement and reminding to do so.   He denies any weight loss.   Per TOC notes, plan to discharge to SNF.   Medications reviewed and include colace and ativan.   Nutrition-Focused physical exam completed. Findings are no fat depletion, no muscle depletion, and no edema.   Labs reviewed: CBGS: 70-95 (inpatient orders for glycemic control are none).   Body mass index is 20.22 kg/m. Patient meets criteria for normal weight range based on current BMI.   Current diet order is heart healthy, patient is consuming approximately 50-75% of meals at this time. Labs and medications reviewed.   No nutrition interventions warranted at this time. If nutrition issues arise, please consult RD.   Loistine Chance, RD, LDN, [place] Registered Dietitian II Certified Diabetes Care and Education Specialist Please refer to [facility] for RD and/or RD on-call/weekend/after hours pager

## 2020-01-18 NOTE — Progress Notes (Signed)
Physical Therapy Treatment Patient Details Name: [name] MRN: [id number] DOB: [birth year] Today's Date: 01/18/2020    History of Present Illness [name] is a 85 y.o. female with medical history significant of chronic heart block s/p pacer placement, A.Fib on eliquis, dementia. Pt presents to ED after having a witnessed syncopal event at home after lunch. CT head neg for acute event    PT Comments    Pt asleep upon entry, requiring increased time to arouse, but alert once sitting edge of bed. Requiring moderate assist for transfers and limited ambulation using walker. Demonstrates decreased functional mobility secondary to weakness, balance deficits, and cognitive impairments. After discussion with pt husband, he prefers SNF as he does not feel he can provide necessary assist or supervision. Updating d/c recommendation to SNF in light of deficits and decreased caregiver support.    Follow Up Recommendations  SNF     Equipment Recommendations  None recommended by PT    Recommendations for Other Services       Precautions / Restrictions Precautions Precautions: Fall Restrictions Weight Bearing Restrictions: No    Mobility  Bed Mobility Overal bed mobility: Needs Assistance Bed Mobility: Supine to Sit     Supine to sit: Max assist     General bed mobility comments: MaxA for assist for BLE's and trunk to sitting position, limited due to level of alertness  Transfers Overall transfer level: Needs assistance Equipment used: Rolling walker (2 wheeled);None Transfers: Sit to/from Stand Sit to Stand: Mod assist         General transfer comment: Heavy modA to rise to standing position from edge of bed, manual assist for placing BLE's in wider BOS  Ambulation/Gait Ambulation/Gait assistance: Mod assist Gait Distance (Feet): 3 Feet Assistive device: Rolling walker (2 wheeled);1 person hand held assist Gait Pattern/deviations: Step-to pattern;Decreased stride  length;Narrow base of support;Scissoring Gait velocity: decreased   General Gait Details: Pt with scissor like gait pattern, narrow BOS, requiring modA for stability and taking pivotal steps from bed to chair.    Stairs             Wheelchair Mobility    Modified Rankin (Stroke Patients Only)       Balance   Sitting-balance support: Feet supported Sitting balance-Leahy Scale: Fair       Standing balance-Leahy Scale: Poor                              Cognition Arousal/Alertness: Awake/alert Behavior During Therapy: Flat affect Overall Cognitive Status: History of cognitive impairments - at baseline                                 General Comments: Pt asleep upon entry, alert once sitting edge of bed. Nonverbal, follows ~25% of 1 step commands. Attempting to pull on cords/lines      Exercises      General Comments        Pertinent Vitals/Pain Pain Assessment: Faces Faces Pain Scale: No hurt    Home Living                      Prior Function            PT Goals (current goals can now be found in the care plan section) Acute Rehab PT Goals Patient Stated Goal: none stated Potential to Achieve Goals:  Good    Frequency    Min 2X/week      PT Plan Discharge plan needs to be updated;Frequency needs to be updated    Co-evaluation              AM-PAC PT "6 Clicks" Mobility   Outcome Measure  Help needed turning from your back to your side while in a flat bed without using bedrails?: A Lot Help needed moving from lying on your back to sitting on the side of a flat bed without using bedrails?: Total Help needed moving to and from a bed to a chair (including a wheelchair)?: A Lot Help needed standing up from a chair using your arms (e.g., wheelchair or bedside chair)?: A Lot Help needed to walk in hospital room?: A Lot Help needed climbing 3-5 steps with a railing? : Total 6 Click Score: 10    End of  Session Equipment Utilized During Treatment: Gait belt Activity Tolerance: Patient limited by fatigue Patient left: in chair;with call bell/phone within reach;with chair alarm set;with family/visitor present Nurse Communication: Mobility status PT Visit Diagnosis: Unsteadiness on feet (R26.81);Muscle weakness (generalized) (M62.81);Difficulty in walking, not elsewhere classified (R26.2)     Time: 1859-0931 PT Time Calculation (min) (ACUTE ONLY): 17 min  Charges:  $Therapeutic Activity: 8-22 mins                       Wyona Almas, PT, DPT Acute Rehabilitation Services Pager [phone] Office [phone]    Deno Etienne 01/18/2020, 9:56 AM

## 2020-01-18 NOTE — TOC Progression Note (Signed)
Transition of Care [facility]) - Progression Note    Patient Details  Name: [name] MRN: [id number] Date of Birth: 1934/10/05  Transition of Care [facility]) CM/SW Contact  Sharin Mons, RN Phone Number:[phone] 01/18/2020, 10:36 AM  Clinical Narrative:    Awaiting bed offers, insurance authorization initiated for SNF placement. TOC team will continue to monitor and follow ....  Expected Discharge Plan: [place] Barriers to Discharge: No SNF bed, Insurance Authorization  Expected Discharge Plan and Services Expected Discharge Plan: [place] arrangements for the past 2 months: Single Family Home                                       Social Determinants of Health (SDOH) Interventions    Readmission Risk Interventions No flowsheet data found.

## 2020-01-18 NOTE — Progress Notes (Signed)
Attending MD has been informed that pt still pulling lines and getting OOB,and ok to renew bilateral soft restraints. Pt remains confused..  Bed in lowest position all wheels locked and 3side rails up with call bell/room phone within reach.

## 2020-01-18 NOTE — Plan of Care (Signed)
  Problem: Education: Goal: Knowledge of General Education information will improve Description: Including pain rating scale, medication(s)/side effects and non-pharmacologic comfort measures Outcome: Progressing   Problem: Health Behavior/Discharge Planning: Goal: Ability to manage health-related needs will improve Outcome: Progressing   Problem: Activity: Goal: Risk for activity intolerance will decrease Outcome: Progressing   Problem: Nutrition: Goal: Adequate nutrition will be maintained Outcome: Progressing   Problem: Coping: Goal: Level of anxiety will decrease Outcome: Progressing   Problem: Elimination: Goal: Will not experience complications related to bowel motility Outcome: Progressing   Problem: Safety: Goal: Ability to remain free from injury will improve Outcome: Progressing   

## 2020-01-19 LAB — SARS CORONAVIRUS 2 BY RT PCR (HOSPITAL ORDER, PERFORMED IN ~~LOC~~ HOSPITAL LAB): SARS Coronavirus 2: NEGATIVE

## 2020-01-19 MED ORDER — QUETIAPINE FUMARATE 25 MG PO TABS
25.0000 mg | ORAL_TABLET | Freq: Every day | ORAL | Status: DC
Start: 2020-01-19 — End: 2020-02-06

## 2020-01-19 NOTE — TOC Progression Note (Addendum)
Transition of Care [facility]) - Progression Note    Patient Details  Name: [name] MRN: [id number] Date of Birth: 1934-12-11  Transition of Care [facility]) CM/SW Contact  Sharin Mons, RN Phone Number: [phone] 01/19/2020, 9:40 AM  Clinical Narrative:    Awaiting response from [facility] admissions regarding SNF bed offer. Insurance authorization pending ....  TOC team will continue to monitor for needs ...  7/30 @ 1500  NCM received SNF approval x 5 days, 7/20-8/3. [place], [place]. [place] [id number].   Expected Discharge Plan: [place] Barriers to Discharge: No SNF bed, Insurance Authorization  Expected Discharge Plan and Services Expected Discharge Plan: [place] arrangements for the past 2 months: Single Family Home                                       Social Determinants of Health (SDOH) Interventions    Readmission Risk Interventions No flowsheet data found.

## 2020-01-19 NOTE — Progress Notes (Signed)
Pt was seen in room , and daughter/Angela informed this writer that her mom is having tremors on both hands. Examined pt and denies any pain./palpitations/SOB. No N& V noted. Pt transferred OOB to chair.x2assist, Pt was noted hands shaking and offered  Warm blanket. Attending MD has been informed of above and to closely monitor pt. and will be seen tomorrow.

## 2020-01-19 NOTE — Discharge Summary (Signed)
Physician Discharge Summary  [name] PXT:[id number] DOB: Oct 31, 1934 DOA: 01/14/2020  PCP: Elby Showers, MD  Admit date: 01/14/2020 Discharge date: 01/19/2020  Admitted From: Home Disposition:  SNF  Discharge Condition:Stable CODE STATUS:FULL Diet recommendation: Heart Healthy   Brief/Interim Summary:  Patient is a 85 year old female with history of chronic heart block status post pacemaker placement , A. fib on Eliquis, dementia who presents to the emergency room with syncopal episode at home.  Patient had a witnessed syncopal event at home after having lunch.  As per the husband, she fell over the floor.  She was unresponsive for approximately 1 minute.  No seizure-like activity.  CT head did not show any acute intracranial abnormality, showed progressive atrophic changes, mild ventriculomegaly.  Patient was admitted for the evaluation of syncope.  Currently she remains hemodynamically stable.  Hospital course remarkable for persistent agitation, confusion, delirium but she is much calmer now.  She is medically stable for discharge to SNF.  Following problems were addressed during her hospitalization:  Syncopal event: Interrogated the pacemaker without any finding that needs attention.  Patient has [place] pacemaker. TSH is normal.  Currently she is hemodynamically stable. Echocardiogram has been done during this hospitalization showed EF of 55 to 60%, no gross valvular abnormalities.  Paroxysmal A. fib: On Eliquis for anticoagulation.  Currently rate is controlled.  Dementia: Severe dementia at baseline.  Confused at baseline.  Continue home medication, continue supportive care.  Avoid benzodiazepines as much as possible.  Started on low-dose Seroquel.  Since to follow-up with neurology as an outpatient.  Hypothyroidism: On levothyroxine at home.  Disposition: Family unable to provide 24/7 care at home.  Aids are available only after 2 weeks.  Family requesting for skilled  nursing facility placement .     Discharge Diagnoses:  Principal Problem:   Syncope Active Problems:   Pacemaker   Dementia arising in the senium and presenium ([place])   Paroxysmal atrial fibrillation [facility])    Discharge Instructions  Discharge Instructions    Diet - low sodium heart healthy   Complete by: As directed    Discharge instructions   Complete by: As directed    1)Please take prescribed medications as instructed 2)Follow up with neurology as an outpatient   Increase activity slowly   Complete by: As directed      Allergies as of 01/19/2020      Reactions   Sulfa Antibiotics Diarrhea, Rash   Sulfasalazine Diarrhea, Rash      Medication List    STOP taking these medications   LORazepam 0.5 MG tablet Commonly known as: ATIVAN     TAKE these medications   acetaminophen 325 MG tablet Commonly known as: TYLENOL Take 650 mg by mouth every 6 (six) hours as needed (pain).   b complex vitamins tablet Take 1 tablet by mouth daily.   Calcium Citrate 250 MG Tabs Take 250 mg by mouth daily.   DOCUSATE SODIUM PO Take 1 capsule by mouth daily.   Eliquis 2.5 MG Tabs tablet Generic drug: apixaban TAKE 1 TABLET(2.5 MG) BY MOUTH TWICE DAILY What changed: See the new instructions.   levothyroxine 75 MCG tablet Commonly known as: SYNTHROID Take 75 mcg by mouth daily before breakfast.   Namzaric 28-10 MG Cp24 Generic drug: Memantine HCl-Donepezil HCl Take 1 capsule by mouth at bedtime.   NONFORMULARY OR COMPOUNDED ITEM Antifungal solution: Terbinafine 3%, Fluconazole 2%, Tea Tree Oil 5%, Urea 10%, Ibuprofen 2% in DMSO suspension #64mL What changed:  how much to take  how to take this  when to take this  additional instructions   QUEtiapine 25 MG tablet Commonly known as: SEROquel Take 1 tablet (25 mg total) by mouth at bedtime.   Vitamin D3 125 MCG (5000 UT) Tabs Take 5,000 Units by mouth daily.       Allergies  Allergen Reactions  . Sulfa  Antibiotics Diarrhea and Rash  . Sulfasalazine Diarrhea and Rash    Consultations:  None   Procedures/Studies: CT Head Wo Contrast  Result Date: 01/14/2020 CLINICAL DATA:  Syncope EXAM: CT HEAD WITHOUT CONTRAST TECHNIQUE: Contiguous axial images were obtained from the base of the skull through the vertex without intravenous contrast. COMPARISON:  04/12/2013 FINDINGS: Brain: There is normal anatomic configuration of the brain. There is mild to moderate parenchymal atrophy, which has progressed since prior examination, but appears commensurate with the patient's age. There is mild ventriculomegaly with particular enlargement of the temporal horns of the lateral ventricles. While this may reflect the sequela of central atrophy, normal pressure hydrocephalus could appear in similar fashion. There is no evidence of acute intracranial hemorrhage or infarct. No abnormal mass effect or midline shift. No abnormal intra or extra-axial mass lesion or fluid collection Vascular: Unremarkable Skull: Intact Sinuses/Orbits: Paranasal sinuses are clear. Orbits are unremarkable. Other: Mastoid air cells and middle ear cavities are clear. IMPRESSION: No acute intracranial hemorrhage or infarct. Progressive atrophic change. Mild ventriculomegaly may reflect the sequela of central atrophy, however, clinical correlation for signs and symptoms of normal pressure hydrocephalus may be helpful. Electronically Signed   By: Fidela Salisbury MD   On: 01/14/2020 18:22   DG Chest Port 1 View  Result Date: 01/14/2020 CLINICAL DATA:  Syncope EXAM: PORTABLE CHEST 1 VIEW COMPARISON:  01/02/2015 FINDINGS: The heart size and mediastinal contours are within normal limits. Left chest multi lead pacer. Both lungs are clear. The visualized skeletal structures are unremarkable. IMPRESSION: No acute abnormality of the lungs in portable projection. Electronically Signed   By: Eddie Candle M.D.   On: 01/14/2020 15:14   ECHOCARDIOGRAM  COMPLETE  Result Date: 01/15/2020    ECHOCARDIOGRAM REPORT   Patient Name:   [name] Date of Exam: 01/15/2020 Medical Rec #:  [id number]   Height:       69.0 in Accession #:    [id number]  Weight:       138.0 lb Date of Birth:  1935-05-04   BSA:          1.765 m Patient Age:    57 years    BP:           112/64 mmHg Patient Gender: F           HR:           65 bpm. Exam Location:  Inpatient Procedure: 2D Echo Indications:    syncope 780.2  History:        Patient has prior history of Echocardiogram examinations, most                 recent 11/29/2012. Pacemaker; Arrythmias:Atrial Fibrillation.  Sonographer:    Johny Chess Referring Phys: [phone] JARED M GARDNER  Sonographer Comments: Image acquisition challenging due to uncooperative patient. IMPRESSIONS  1. Left ventricular ejection fraction, by estimation, is 55 to 60%. The left ventricle has normal function. The left ventricle has no regional wall motion abnormalities. Left ventricular diastolic parameters are indeterminate.  2. Right ventricular systolic function is normal. The right ventricular  size is mildly enlarged. Tricuspid regurgitation signal is inadequate for assessing PA pressure.  3. The mitral valve is grossly normal. No evidence of mitral valve regurgitation. No evidence of mitral stenosis.  4. The aortic valve is tricuspid. Aortic valve regurgitation is not visualized. No aortic stenosis is present.  5. The inferior vena cava is normal in size with greater than 50% respiratory variability, suggesting right atrial pressure of 3 mmHg. FINDINGS  Left Ventricle: Left ventricular ejection fraction, by estimation, is 55 to 60%. The left ventricle has normal function. The left ventricle has no regional wall motion abnormalities. The left ventricular internal cavity size was normal in size. There is  no left ventricular hypertrophy. Left ventricular diastolic parameters are indeterminate. Right Ventricle: The right ventricular size is mildly enlarged.  No increase in right ventricular wall thickness. Right ventricular systolic function is normal. Tricuspid regurgitation signal is inadequate for assessing PA pressure. Left Atrium: Left atrial size was normal in size. Right Atrium: Right atrial size was normal in size. Pericardium: There is no evidence of pericardial effusion. Mitral Valve: The mitral valve is grossly normal. There is mild calcification of the anterior and posterior mitral valve leaflet(s). No evidence of mitral valve regurgitation. No evidence of mitral valve stenosis. Tricuspid Valve: The tricuspid valve is grossly normal. Tricuspid valve regurgitation is not demonstrated. No evidence of tricuspid stenosis. Aortic Valve: The aortic valve is tricuspid. Aortic valve regurgitation is not visualized. No aortic stenosis is present. Pulmonic Valve: The pulmonic valve was grossly normal. Pulmonic valve regurgitation is not visualized. No evidence of pulmonic stenosis. Aorta: The aortic root is normal in size and structure. Venous: The inferior vena cava is normal in size with greater than 50% respiratory variability, suggesting right atrial pressure of 3 mmHg. IAS/Shunts: The atrial septum is grossly normal. Additional Comments: A pacer wire is visualized in the right ventricle and right atrium.  LEFT VENTRICLE PLAX 2D LVIDd:         4.00 cm     Diastology LVIDs:         2.50 cm     LV e' lateral:   9.46 cm/s LV PW:         0.80 cm     LV E/e' lateral: 6.7 LV IVS:        0.80 cm     LV e' medial:    7.29 cm/s LVOT diam:     2.10 cm     LV E/e' medial:  8.7 LV SV:         43 LV SV Index:   24 LVOT Area:     3.46 cm  LV Volumes (MOD) LV vol d, MOD A4C: 50.0 ml LV vol s, MOD A4C: 27.2 ml LV SV MOD A4C:     50.0 ml RIGHT VENTRICLE             IVC RV S prime:     11.30 cm/s  IVC diam: 1.70 cm TAPSE (M-mode): 1.4 cm LEFT ATRIUM             Index       RIGHT ATRIUM           Index LA Vol (A2C):   33.6 ml 19.04 ml/m RA Area:     13.50 cm LA Vol (A4C):   29.2  ml 16.55 ml/m RA Volume:   32.40 ml  18.36 ml/m LA Biplane Vol: 33.2 ml 18.81 ml/m  AORTIC VALVE LVOT Vmax:   62.70 cm/s LVOT  Vmean:  42.600 cm/s LVOT VTI:    0.124 m  AORTA Ao Root diam: 3.20 cm Ao Asc diam:  2.60 cm MITRAL VALVE MV Area (PHT): 2.76 cm    SHUNTS MV Decel Time: 275 msec    Systemic VTI:  0.12 m MV E velocity: 63.40 cm/s  Systemic Diam: 2.10 cm MV A velocity: 57.40 cm/s MV E/A ratio:  1.10 Eleonore Chiquito MD Electronically signed by Eleonore Chiquito MD Signature Date/Time: 01/15/2020/5:27:59 PM    Final        Subjective:  Patient seen and examined at the bedside this morning.  Medically stable for discharge. Discharge Exam: Vitals:   01/18/20 1934 01/19/20 0330  BP: (!) 126/56 (!) 118/57  Pulse: 81 65  Resp: 16 17  Temp: 98.1 F (36.7 C) 97.6 F (36.4 C)  SpO2: 99% 98%   Vitals:   01/18/20 1500 01/18/20 1934 01/19/20 0330 01/19/20 0612  BP: 126/73 (!) 126/56 (!) 118/57   Pulse: 74 81 65   Resp: 18 16 17    Temp: 98.1 F (36.7 C) 98.1 F (36.7 C) 97.6 F (36.4 C)   TempSrc: Oral Oral Oral   SpO2: 98% 99% 98%   Weight:    61.1 kg  Height:        General: Pt is alert, awake, not in acute distress Cardiovascular: RRR, S1/S2 +, no rubs, no gallops Respiratory: CTA bilaterally, no wheezing, no rhonchi Abdominal: Soft, NT, ND, bowel sounds + Extremities: no edema, no cyanosis    The results of significant diagnostics from this hospitalization (including imaging, microbiology, ancillary and laboratory) are listed below for reference.     Microbiology: Recent Results (from the past 240 hour(s))  SARS Coronavirus 2 by RT PCR (hospital order, performed in [facility] hospital lab) Nasopharyngeal Nasopharyngeal Swab     Status: None   Collection Time: 01/14/20  7:20 PM   Specimen: Nasopharyngeal Swab  Result Value Ref Range Status   SARS Coronavirus 2 NEGATIVE NEGATIVE Final    Comment: (NOTE) SARS-CoV-2 target nucleic acids are NOT DETECTED.  The SARS-CoV-2  RNA is generally detectable in upper and lower respiratory specimens during the acute phase of infection. The lowest concentration of SARS-CoV-2 viral copies this assay can detect is 250 copies / mL. A negative result does not preclude SARS-CoV-2 infection and should not be used as the sole basis for treatment or other patient management decisions.  A negative result may occur with improper specimen collection / handling, submission of specimen other than nasopharyngeal swab, presence of viral mutation(s) within the areas targeted by this assay, and inadequate number of viral copies (<250 copies / mL). A negative result must be combined with clinical observations, patient history, and epidemiological information.  Fact Sheet for Patients:   StrictlyIdeas.no  Fact Sheet for Healthcare Providers: BankingDealers.co.za  This test is not yet approved or  cleared by the [place] FDA and has been authorized for detection and/or diagnosis of SARS-CoV-2 by FDA under an Emergency Use Authorization (EUA).  This EUA will remain in effect (meaning this test can be used) for the duration of the COVID-19 declaration under Section 564(b)(1) of the Act, 21 U.S.C. section 360bbb-3(b)(1), unless the authorization is terminated or revoked sooner.  Performed at [facility], [place] [address] [place], [place] [address]      Labs: BNP (last 3 results) No results for input(s): BNP in the last 8760 hours. Basic Metabolic Panel: Recent Labs  Lab 01/14/20 1449 01/15/20 0316  NA 140 139  K 3.8 3.7  CL 105 109  CO2 27 24  GLUCOSE 131* 87  BUN 24* 22  CREATININE 1.34* 1.00  CALCIUM 9.0 8.3*   Liver Function Tests: Recent Labs  Lab 01/14/20 1449  AST 22  ALT 14  ALKPHOS 52  BILITOT 0.7  PROT 6.7  ALBUMIN 3.7   No results for input(s): LIPASE, AMYLASE in the last 168 hours. No results for input(s): AMMONIA in the last 168  hours. CBC: Recent Labs  Lab 01/14/20 2138 01/15/20 0316  WBC 8.0 7.0  NEUTROABS 5.4  --   HGB 13.8 14.1  HCT 42.7 42.6  MCV 96.4 95.9  PLT 193 185   Cardiac Enzymes: No results for input(s): CKTOTAL, CKMB, CKMBINDEX, TROPONINI in the last 168 hours. BNP: Invalid input(s): POCBNP CBG: Recent Labs  Lab 01/14/20 1430 01/15/20 0652 01/16/20 0758 01/17/20 0434  GLUCAP 169* 78 70 95   D-Dimer No results for input(s): DDIMER in the last 72 hours. Hgb A1c No results for input(s): HGBA1C in the last 72 hours. Lipid Profile No results for input(s): CHOL, HDL, LDLCALC, TRIG, CHOLHDL, LDLDIRECT in the last 72 hours. Thyroid function studies No results for input(s): TSH, T4TOTAL, T3FREE, THYROIDAB in the last 72 hours.  Invalid input(s): FREET3 Anemia work up No results for input(s): VITAMINB12, FOLATE, FERRITIN, TIBC, IRON, RETICCTPCT in the last 72 hours. Urinalysis    Component Value Date/Time   COLORURINE YELLOW 01/16/2020 1924   APPEARANCEUR HAZY (A) 01/16/2020 1924   LABSPEC 1.025 01/16/2020 1924   PHURINE 5.0 01/16/2020 1924   GLUCOSEU NEGATIVE 01/16/2020 1924   HGBUR MODERATE (A) 01/16/2020 1924   BILIRUBINUR NEGATIVE 01/16/2020 1924   BILIRUBINUR NEG 03/16/2019 1651   KETONESUR 20 (A) 01/16/2020 1924   PROTEINUR NEGATIVE 01/16/2020 1924   UROBILINOGEN 0.2 03/16/2019 1651   UROBILINOGEN 0.2 08/08/2014 1912   NITRITE POSITIVE (A) 01/16/2020 1924   LEUKOCYTESUR SMALL (A) 01/16/2020 1924   Sepsis Labs Invalid input(s): PROCALCITONIN,  WBC,  LACTICIDVEN Microbiology Recent Results (from the past 240 hour(s))  SARS Coronavirus 2 by RT PCR (hospital order, performed in [place] hospital lab) Nasopharyngeal Nasopharyngeal Swab     Status: None   Collection Time: 01/14/20  7:20 PM   Specimen: Nasopharyngeal Swab  Result Value Ref Range Status   SARS Coronavirus 2 NEGATIVE NEGATIVE Final    Comment: (NOTE) SARS-CoV-2 target nucleic acids are NOT  DETECTED.  The SARS-CoV-2 RNA is generally detectable in upper and lower respiratory specimens during the acute phase of infection. The lowest concentration of SARS-CoV-2 viral copies this assay can detect is 250 copies / mL. A negative result does not preclude SARS-CoV-2 infection and should not be used as the sole basis for treatment or other patient management decisions.  A negative result may occur with improper specimen collection / handling, submission of specimen other than nasopharyngeal swab, presence of viral mutation(s) within the areas targeted by this assay, and inadequate number of viral copies (<250 copies / mL). A negative result must be combined with clinical observations, patient history, and epidemiological information.  Fact Sheet for Patients:   StrictlyIdeas.no  Fact Sheet for Healthcare Providers: BankingDealers.co.za  This test is not yet approved or  cleared by the [place] FDA and has been authorized for detection and/or diagnosis of SARS-CoV-2 by FDA under an Emergency Use Authorization (EUA).  This EUA will remain in effect (meaning this test can be used) for the duration of the COVID-19 declaration under  Section 564(b)(1) of the Act, 21 U.S.C. section 360bbb-3(b)(1), unless the authorization is terminated or revoked sooner.  Performed at [facility], [place] [address] [place], [place] [address]     Please note: You were cared for by a hospitalist during your hospital stay. Once you are discharged, your primary care physician will handle any further medical issues. Please note that NO REFILLS for any discharge medications will be authorized once you are discharged, as it is imperative that you return to your primary care physician (or establish a relationship with a primary care physician if you do not have one) for your post hospital discharge needs so that they can reassess your need for  medications and monitor your lab values.    Time coordinating discharge: 40 minutes  SIGNED:   Shelly Coss, MD  [facility] 01/19/2020, 3:22 PM Pager [phone]  If 7PM-7AM, please contact night-coverage [email or web address] Password [email or web address]

## 2020-01-19 NOTE — Progress Notes (Signed)
[facility] wont be able to pick up pt before 8pm, and family would like to see her d/c at 600pmish today instead of 8pm.  Agreement was reached and pt would be d/c tomorrow at 0830am and am staff nurse will make an appt. Tomorrow at 0800 for dispatch. Attending MD made aware of above, and ADON aware.

## 2020-01-19 NOTE — Progress Notes (Signed)
During rounds/shift change report, Pt was off bilateral wrists restraints. Pt in bed resting comfortably.

## 2020-01-19 NOTE — TOC Transition Note (Signed)
Transition of Care [facility]) - CM/SW Discharge Note   Patient Details  Name: [name] MRN: [id number] Date of Birth: 1934-06-23  Transition of Care [facility]) CM/SW Contact:  Sharin Mons, RN Phone Number: [phone]  01/19/2020, 3:14 PM   Clinical Narrative:     Patient will DC to: [facility] Anticipated DC date: 01/19/2020 Family notified: husband, daughter Levada Dy) Transport by: Corey Harold   Per MD patient ready for DC today. RN, patient, patient's family, and facility notified of DC. Discharge Summary and FL2 sent to facility. RN to call report prior to discharge ( [phone]). Rm# [phone]. DC packet on chart. Ambulance transport requested for patient, time pick up for 6 pm.  RNCM will sign off for now as intervention is no longer needed. Please consult [place] again if new needs arise.   Final next level of care: Skilled Nursing Facility Barriers to Discharge: No Barriers Identified   Patient Goals and CMS Choice Patient states their goals for this hospitalization and ongoing recovery are:: go to rehab then return home CMS Medicare.gov Compare Post Acute Care list provided to:: Patient Choice offered to / list presented to : Patient  Discharge Placement                       Discharge Plan and Services                                     Social Determinants of Health (SDOH) Interventions     Readmission Risk Interventions No flowsheet data found.

## 2020-01-19 NOTE — Progress Notes (Signed)
Report given to [place] at [facility], All questions and concerns were fully answered. covid test still in process and will inform [place] if results is otherwise. Family at bedside and inform of above with no complaints voiced. [facility] booked at 1800hrs for pick up.

## 2020-01-19 NOTE — Plan of Care (Signed)
°  Problem: Education: °Goal: Knowledge of General Education information will improve °Description: Including pain rating scale, medication(s)/side effects and non-pharmacologic comfort measures °Outcome: Progressing °  °Problem: Health Behavior/Discharge Planning: °Goal: Ability to manage health-related needs will improve °Outcome: Progressing °  °Problem: Clinical Measurements: °Goal: Ability to maintain clinical measurements within normal limits will improve °Outcome: Progressing °  °Problem: Activity: °Goal: Risk for activity intolerance will decrease °Outcome: Progressing °  °Problem: Nutrition: °Goal: Adequate nutrition will be maintained °Outcome: Progressing °  °Problem: Safety: °Goal: Ability to remain free from injury will improve °Outcome: Progressing °  °

## 2020-01-19 NOTE — Progress Notes (Signed)
PROGRESS NOTE    [name]  GGE:[id number] DOB: 10-03-1934 DOA: 01/14/2020 PCP: Elby Showers, MD   Brief Narrative: Patient is a 85 year old female with history of chronic heart block status post pacemaker placement , A. fib on Eliquis, dementia who presents to the emergency room with syncopal episode at home.  Patient had a witnessed syncopal event at home after having lunch.  As per the husband, she fell over the floor.  She was unresponsive for approximately 1 minute.  No seizure-like activity.  CT head did not show any acute intracranial abnormality, showed progressive atrophic changes, mild ventriculomegaly.  Patient was admitted for the evaluation of syncope.  Currently she remains hemodynamically stable.  Hospital course remarkable for persistent agitation, confusion, delirium.  She is medically stable for discharge to SNF.  Assessment & Plan:   Principal Problem:   Syncope Active Problems:   Pacemaker   Dementia arising in the senium and presenium ([place])   Paroxysmal atrial fibrillation ([place])   Syncopal event: Interrogated the pacemaker without any finding that needs attention.  Patient has [place] pacemaker. TSH is normal.  Currently she is hemodynamically stable. Echocardiogram has been done during this hospitalization showed EF of 55 to 60%, no gross valvular abnormalities.  Paroxysmal A. fib: On Eliquis for anticoagulation.  Currently rate is controlled.  Dementia: Severe dementia at baseline.  Confused at baseline.  Continue home medication, continue supportive care.  Avoid benzodiazepines as much as possible.  Started on low-dose Seroquel.  IV Haldol for severe agitation.  Hypothyroidism: On levothyroxine at home.  Anxiety: On as needed Ativan.  Disposition: Family unable to provide 24/7 care at home.  Aids are available only after 2 weeks.  Family requesting for skilled nursing facility placement .  Social worker closely following.         DVT  prophylaxis:Eliquis Code Status: Full Family Communication: Discussed with husband at bedside Status is: inpatient Dispo: The patient is from: Home              Anticipated d/c is to: Skilled nursing facility               Anticipated d/c date is:As soon as bed is available              Patient currently is medically stable for discharge, waiting for disposition Still inpatient because of unsafe discharge plan    Consultants: None Procedures:None  Antimicrobials:  Anti-infectives (From admission, onward)   None      Subjective: Patient seen and examined at the bedside today.  No active issues.  Waiting for placement.  Husband at the bedside Objective: Vitals:   01/18/20 1500 01/18/20 1934 01/19/20 0330 01/19/20 0612  BP: 126/73 (!) 126/56 (!) 118/57   Pulse: 74 81 65   Resp: 18 16 17    Temp: 98.1 F (36.7 C) 98.1 F (36.7 C) 97.6 F (36.4 C)   TempSrc: Oral Oral Oral   SpO2: 98% 99% 98%   Weight:    61.1 kg  Height:        Intake/Output Summary (Last 24 hours) at 01/19/2020 1443 Last data filed at 01/19/2020 4650 Gross per 24 hour  Intake 120 ml  Output 500 ml  Net -380 ml   Filed Weights   01/17/20 0456 01/18/20 0600 01/19/20 0612  Weight: 61.9 kg 62.1 kg 61.1 kg    Examination:   General exam: Appears calm and comfortable ,Not in distress, deconditioned, debilitated female Respiratory system: Bilateral equal  air entry, normal vesicular breath sounds, no wheezes or crackles  Cardiovascular system: S1 & S2 heard, RRR. No JVD, murmurs, rubs, gallops or clicks. Gastrointestinal system: Abdomen is nondistended, soft and nontender. No organomegaly or masses felt. Normal bowel sounds heard. Central nervous system: Not alert or oriented Extremities: No edema, no clubbing ,no cyanosis Skin: No rashes, lesions or ulcers,no icterus ,no pallor   Data Reviewed: I have personally reviewed following labs and imaging studies  CBC: Recent Labs  Lab 01/14/20 2138  01/15/20 0316  WBC 8.0 7.0  NEUTROABS 5.4  --   HGB 13.8 14.1  HCT 42.7 42.6  MCV 96.4 95.9  PLT 193 854   Basic Metabolic Panel: Recent Labs  Lab 01/14/20 1449 01/15/20 0316  NA 140 139  K 3.8 3.7  CL 105 109  CO2 27 24  GLUCOSE 131* 87  BUN 24* 22  CREATININE 1.34* 1.00  CALCIUM 9.0 8.3*   GFR: Estimated Creatinine Clearance: 41.1 mL/min (by C-G formula based on SCr of 1 mg/dL). Liver Function Tests: Recent Labs  Lab 01/14/20 1449  AST 22  ALT 14  ALKPHOS 52  BILITOT 0.7  PROT 6.7  ALBUMIN 3.7   No results for input(s): LIPASE, AMYLASE in the last 168 hours. No results for input(s): AMMONIA in the last 168 hours. Coagulation Profile: Recent Labs  Lab 01/14/20 1449  INR 1.1   Cardiac Enzymes: No results for input(s): CKTOTAL, CKMB, CKMBINDEX, TROPONINI in the last 168 hours. BNP (last 3 results) No results for input(s): PROBNP in the last 8760 hours. HbA1C: No results for input(s): HGBA1C in the last 72 hours. CBG: Recent Labs  Lab 01/14/20 1430 01/15/20 0652 01/16/20 0758 01/17/20 0434  GLUCAP 169* 78 70 95   Lipid Profile: No results for input(s): CHOL, HDL, LDLCALC, TRIG, CHOLHDL, LDLDIRECT in the last 72 hours. Thyroid Function Tests: No results for input(s): TSH, T4TOTAL, FREET4, T3FREE, THYROIDAB in the last 72 hours. Anemia Panel: No results for input(s): VITAMINB12, FOLATE, FERRITIN, TIBC, IRON, RETICCTPCT in the last 72 hours. Sepsis Labs: Recent Labs  Lab 01/14/20 1449  LATICACIDVEN 1.9    Recent Results (from the past 240 hour(s))  SARS Coronavirus 2 by RT PCR (hospital order, performed in [facility] hospital lab) Nasopharyngeal Nasopharyngeal Swab     Status: None   Collection Time: 01/14/20  7:20 PM   Specimen: Nasopharyngeal Swab  Result Value Ref Range Status   SARS Coronavirus 2 NEGATIVE NEGATIVE Final    Comment: (NOTE) SARS-CoV-2 target nucleic acids are NOT DETECTED.  The SARS-CoV-2 RNA is generally detectable in  upper and lower respiratory specimens during the acute phase of infection. The lowest concentration of SARS-CoV-2 viral copies this assay can detect is 250 copies / mL. A negative result does not preclude SARS-CoV-2 infection and should not be used as the sole basis for treatment or other patient management decisions.  A negative result may occur with improper specimen collection / handling, submission of specimen other than nasopharyngeal swab, presence of viral mutation(s) within the areas targeted by this assay, and inadequate number of viral copies (<250 copies / mL). A negative result must be combined with clinical observations, patient history, and epidemiological information.  Fact Sheet for Patients:   StrictlyIdeas.no  Fact Sheet for Healthcare Providers: BankingDealers.co.za  This test is not yet approved or  cleared by the [place] FDA and has been authorized for detection and/or diagnosis of SARS-CoV-2 by FDA under an Emergency Use Authorization (EUA).  This  EUA will remain in effect (meaning this test can be used) for the duration of the COVID-19 declaration under Section 564(b)(1) of the Act, 21 U.S.C. section 360bbb-3(b)(1), unless the authorization is terminated or revoked sooner.  Performed at [facility], [place] [address] [place], [place] [address]          Radiology Studies: No results found.      Scheduled Meds: . apixaban  2.5 mg Oral BID  . calcium citrate  950 mg Oral Daily  . cholecalciferol  5,000 Units Oral Daily  . docusate sodium  50 mg Oral Daily  . memantine  28 mg Oral QHS   And  . donepezil  10 mg Oral QHS  . levothyroxine  75 mcg Oral Q0600  . LORazepam  0.5 mg Oral QHS  . QUEtiapine  25 mg Oral QHS  . sodium chloride flush  3 mL Intravenous Q12H   Continuous Infusions:   LOS: 4 days    Time spent: 15 mins,More than 50% of that time was spent in counseling and/or  coordination of care.      Shelly Coss, MD [facility] P7/30/2021, 2:43 PM

## 2020-01-20 DIAGNOSIS — M2550 Pain in unspecified joint: Secondary | ICD-10-CM | POA: Diagnosis not present

## 2020-01-20 DIAGNOSIS — I442 Atrioventricular block, complete: Secondary | ICD-10-CM | POA: Diagnosis not present

## 2020-01-20 DIAGNOSIS — R5383 Other fatigue: Secondary | ICD-10-CM | POA: Diagnosis not present

## 2020-01-20 DIAGNOSIS — I480 Paroxysmal atrial fibrillation: Secondary | ICD-10-CM | POA: Diagnosis not present

## 2020-01-20 DIAGNOSIS — E039 Hypothyroidism, unspecified: Secondary | ICD-10-CM | POA: Diagnosis not present

## 2020-01-20 DIAGNOSIS — Z950 Presence of cardiac pacemaker: Secondary | ICD-10-CM | POA: Diagnosis not present

## 2020-01-20 DIAGNOSIS — R451 Restlessness and agitation: Secondary | ICD-10-CM | POA: Diagnosis not present

## 2020-01-20 DIAGNOSIS — R55 Syncope and collapse: Secondary | ICD-10-CM | POA: Diagnosis not present

## 2020-01-20 DIAGNOSIS — Z7901 Long term (current) use of anticoagulants: Secondary | ICD-10-CM | POA: Diagnosis not present

## 2020-01-20 DIAGNOSIS — R404 Transient alteration of awareness: Secondary | ICD-10-CM | POA: Diagnosis not present

## 2020-01-20 DIAGNOSIS — G309 Alzheimer's disease, unspecified: Secondary | ICD-10-CM | POA: Diagnosis not present

## 2020-01-20 DIAGNOSIS — F0390 Unspecified dementia without behavioral disturbance: Secondary | ICD-10-CM | POA: Diagnosis not present

## 2020-01-20 DIAGNOSIS — Z7401 Bed confinement status: Secondary | ICD-10-CM | POA: Diagnosis not present

## 2020-01-20 DIAGNOSIS — F411 Generalized anxiety disorder: Secondary | ICD-10-CM | POA: Diagnosis not present

## 2020-01-20 DIAGNOSIS — M6281 Muscle weakness (generalized): Secondary | ICD-10-CM | POA: Diagnosis not present

## 2020-01-20 DIAGNOSIS — R413 Other amnesia: Secondary | ICD-10-CM | POA: Diagnosis not present

## 2020-01-20 NOTE — TOC Progression Note (Signed)
Transition of Care [facility]) - Progression Note    Patient Details  Name: [name] MRN: [id number] Date of Birth: September 11, 1934  Transition of Care [facility]) CM/SW [place], [place] Phone Number: 01/20/2020, 10:29 AM  Clinical Narrative:    Reached out to facility.  Waiting call back.  TOC Team will continue assist for disposition planning.     Expected Discharge Plan: [place] Barriers to Discharge: No Barriers Identified  Expected Discharge Plan and Services Expected Discharge Plan: [place] arrangements for the past 2 months: Single Family Home Expected Discharge Date: 01/19/20                                     Social Determinants of Health (SDOH) Interventions    Readmission Risk Interventions No flowsheet data found.

## 2020-01-20 NOTE — Progress Notes (Signed)
Patient seen and examined at the bedside this morning.  Hemodynamically stable.  She will be discharged today to SNF.  Discharge order and summary already on place yesterday.  No change in medical management.

## 2020-01-20 NOTE — Progress Notes (Signed)
Patient being discharged to SNF. Vitals stable, currently the patient is confused and able to follow commands. IV removed. AVS explained to patient but no evidence of learning. Report called to facility. Pt to be transported by [facility].

## 2020-01-20 NOTE — TOC Transition Note (Signed)
Transition of Care [facility]) - CM/SW Discharge Note   Patient Details  Name: [name] MRN: [id number] Date of Birth: [birth year]  Transition of Care [facility]) CM/SW Contact:  Gabrielle Dare Phone Number: 01/20/2020, 10:56 AM   Clinical Narrative:     Patient will Discharge To: [place] Anticipated DC Date: 01/20/20 Family Notified: yes, spouse, [name], [phone] Transport By: [name]   Per MD patient ready for DC to [facility] . RN, patient, patient's family, and facility notified of DC. Assessment, Fl2/Pasrr, and Discharge Summary sent to facility. RN given number for report [phone], Room # 114). DC packet on chart. Ambulance transport requested for patient.   CSW signing off.  [name] [facility] [phone]     Final next level of care: Skilled Nursing Facility Barriers to Discharge: No Barriers Identified   Patient Goals and CMS Choice Patient states their goals for this hospitalization and ongoing recovery are:: go to rehab then return home CMS Medicare.gov Compare Post Acute Care list provided to:: Patient Choice offered to / list presented to : Patient  Discharge Placement              Patient chooses bed at: [facility] Patient to be transferred to facility by: [place] Name of family member notified: [name] Patient and family notified of of transfer: 01/20/20  Discharge Plan and Services                                     Social Determinants of Health (SDOH) Interventions     Readmission Risk Interventions No flowsheet data found.

## 2020-01-23 DIAGNOSIS — E039 Hypothyroidism, unspecified: Secondary | ICD-10-CM | POA: Diagnosis not present

## 2020-01-23 DIAGNOSIS — I480 Paroxysmal atrial fibrillation: Secondary | ICD-10-CM | POA: Diagnosis not present

## 2020-01-23 DIAGNOSIS — F0390 Unspecified dementia without behavioral disturbance: Secondary | ICD-10-CM | POA: Diagnosis not present

## 2020-01-23 DIAGNOSIS — R55 Syncope and collapse: Secondary | ICD-10-CM | POA: Diagnosis not present

## 2020-01-24 DIAGNOSIS — F411 Generalized anxiety disorder: Secondary | ICD-10-CM | POA: Diagnosis not present

## 2020-01-24 DIAGNOSIS — R451 Restlessness and agitation: Secondary | ICD-10-CM | POA: Diagnosis not present

## 2020-01-24 DIAGNOSIS — F0390 Unspecified dementia without behavioral disturbance: Secondary | ICD-10-CM | POA: Diagnosis not present

## 2020-01-26 DIAGNOSIS — F411 Generalized anxiety disorder: Secondary | ICD-10-CM | POA: Diagnosis not present

## 2020-01-26 DIAGNOSIS — F0390 Unspecified dementia without behavioral disturbance: Secondary | ICD-10-CM | POA: Diagnosis not present

## 2020-01-26 DIAGNOSIS — R5383 Other fatigue: Secondary | ICD-10-CM | POA: Diagnosis not present

## 2020-01-26 DIAGNOSIS — R451 Restlessness and agitation: Secondary | ICD-10-CM | POA: Diagnosis not present

## 2020-01-29 DIAGNOSIS — F411 Generalized anxiety disorder: Secondary | ICD-10-CM | POA: Diagnosis not present

## 2020-01-29 DIAGNOSIS — F0390 Unspecified dementia without behavioral disturbance: Secondary | ICD-10-CM | POA: Diagnosis not present

## 2020-01-29 DIAGNOSIS — R451 Restlessness and agitation: Secondary | ICD-10-CM | POA: Diagnosis not present

## 2020-02-01 DIAGNOSIS — Z950 Presence of cardiac pacemaker: Secondary | ICD-10-CM | POA: Diagnosis not present

## 2020-02-01 DIAGNOSIS — G309 Alzheimer's disease, unspecified: Secondary | ICD-10-CM | POA: Diagnosis not present

## 2020-02-01 DIAGNOSIS — I480 Paroxysmal atrial fibrillation: Secondary | ICD-10-CM | POA: Diagnosis not present

## 2020-02-01 DIAGNOSIS — R55 Syncope and collapse: Secondary | ICD-10-CM | POA: Diagnosis not present

## 2020-02-01 DIAGNOSIS — M6281 Muscle weakness (generalized): Secondary | ICD-10-CM | POA: Diagnosis not present

## 2020-02-02 ENCOUNTER — Telehealth (INDEPENDENT_AMBULATORY_CARE_PROVIDER_SITE_OTHER): Payer: Medicare PPO | Admitting: Internal Medicine

## 2020-02-02 ENCOUNTER — Telehealth: Payer: Self-pay | Admitting: Internal Medicine

## 2020-02-02 DIAGNOSIS — E039 Hypothyroidism, unspecified: Secondary | ICD-10-CM

## 2020-02-02 DIAGNOSIS — Z950 Presence of cardiac pacemaker: Secondary | ICD-10-CM | POA: Diagnosis not present

## 2020-02-02 DIAGNOSIS — R413 Other amnesia: Secondary | ICD-10-CM | POA: Diagnosis not present

## 2020-02-02 DIAGNOSIS — Z7901 Long term (current) use of anticoagulants: Secondary | ICD-10-CM | POA: Diagnosis not present

## 2020-02-02 DIAGNOSIS — R55 Syncope and collapse: Secondary | ICD-10-CM | POA: Diagnosis not present

## 2020-02-02 NOTE — Telephone Encounter (Signed)
Please call her husband and let him know we do not go to assisted living facilities. In order for [place] to sign off on orders for PT, we would need a post discharge visit if she is able to travel or virtually if she is not. It might be a consideration to have facility doctor do  medical care and orders for now.

## 2020-02-02 NOTE — Telephone Encounter (Signed)
[facility] called to confirm Dr Renold Genta as PCP this morning  After lunch  Amy - [facility] call this week end [phone]  Amy called to confirm Dr Renold Genta as PCP and to see if Dr Renold Genta would be signing off on PT orders for Gait and Balance and OT orders for ADL's she stated that patient was being transferred to [facility] and they would be going out next week to evaluate and then send orders to be signed. I let her know Dr Renold Genta had not seen patient since she had been in hospital and I would call her back.

## 2020-02-02 NOTE — Telephone Encounter (Signed)
Scheduled appointment to discuss

## 2020-02-02 NOTE — Telephone Encounter (Signed)
Phone call from Staff at [facility]. Patient admitted there earlier today. They need orders signed/approved. They need my office fax number. She has appt here next week on August 17 for hospital follow up. Apparently had short stay at [facility] after hospital discharge on July 30th. Was hospitalized June 25- June 30 for a fall due to apparent syncope. CT was negative for hemorrhage but patient has longstanding dementia. Had been residing with husband at home with help. Had some issues in hospital with agitation. Apparently Lorazepam prescribed by attending physician at [facility]. Patient not agitated at present time. Reviewed meds with staff. They are to hold Lorazepam for now.  MJB. MD  Time spent speaking with staff, reviewing records from my office and hospital is 20 minutes.

## 2020-02-03 ENCOUNTER — Telehealth: Payer: Self-pay | Admitting: Internal Medicine

## 2020-02-03 ENCOUNTER — Encounter: Payer: Self-pay | Admitting: Internal Medicine

## 2020-02-03 NOTE — Telephone Encounter (Signed)
Reviewed just now orders faxed from [facility]. Apparently there was an incident noted on August 14 where patient tried to scratch staff but no recent events apparently. Not ordering Lorazepam for now.  MJB, MD

## 2020-02-05 ENCOUNTER — Telehealth (INDEPENDENT_AMBULATORY_CARE_PROVIDER_SITE_OTHER): Payer: Medicare PPO | Admitting: Internal Medicine

## 2020-02-05 DIAGNOSIS — R55 Syncope and collapse: Secondary | ICD-10-CM | POA: Diagnosis not present

## 2020-02-05 DIAGNOSIS — R451 Restlessness and agitation: Secondary | ICD-10-CM

## 2020-02-05 DIAGNOSIS — R413 Other amnesia: Secondary | ICD-10-CM | POA: Diagnosis not present

## 2020-02-05 DIAGNOSIS — E039 Hypothyroidism, unspecified: Secondary | ICD-10-CM

## 2020-02-05 NOTE — Telephone Encounter (Signed)
I have spoken with patient's husband, [name], just now by phone as well as their daughter, Vylet. They are at their home and I am in my office.   Patient apparently will be at [facility] temporarily until [facility] can provide evening services. There is a waiting list currently for those services.   They do not want her to have PT. They do not think she needs PT. They say she is ambulating fine at this time.  They have enrolled her in a day care program to start when she gets home apparently. They are asking why she needs to come here at all. Explained this was a Medicare post discharge visit to do med reconciliation    and see how things are going post hospitalization.   Husband will bring patient tomorrow at 3 pm to see me.   It looks like patient did receive Lorazepam injection 0.5 mg occasionally in the hospital for agitation.  Time spent speaking with husband and daughter, reviewing hospital chart, medical decision making and attempting communicating new orders to [facility] is 25 minutes.  Spoke to staff at one building at [facility] which is not where patient resides. Attempted to contact memory care [phone] and got no answer at 5:35 pm.  Called back and asked to speak with nurse at [facility] and was told to call nurse, Tinnie Gens at [phone]. I left detailed message and asked nurse to call back tomorrow.

## 2020-02-06 ENCOUNTER — Other Ambulatory Visit: Payer: Self-pay

## 2020-02-06 ENCOUNTER — Ambulatory Visit: Payer: Medicare PPO | Admitting: Internal Medicine

## 2020-02-06 ENCOUNTER — Encounter: Payer: Self-pay | Admitting: Internal Medicine

## 2020-02-06 ENCOUNTER — Inpatient Hospital Stay: Payer: Medicare PPO | Admitting: Internal Medicine

## 2020-02-06 ENCOUNTER — Telehealth: Payer: Self-pay | Admitting: Internal Medicine

## 2020-02-06 VITALS — BP 98/60 | HR 67 | Ht 69.0 in | Wt 135.0 lb

## 2020-02-06 DIAGNOSIS — Z7901 Long term (current) use of anticoagulants: Secondary | ICD-10-CM | POA: Diagnosis not present

## 2020-02-06 DIAGNOSIS — M170 Bilateral primary osteoarthritis of knee: Secondary | ICD-10-CM | POA: Diagnosis not present

## 2020-02-06 DIAGNOSIS — R55 Syncope and collapse: Secondary | ICD-10-CM | POA: Diagnosis not present

## 2020-02-06 DIAGNOSIS — Z950 Presence of cardiac pacemaker: Secondary | ICD-10-CM | POA: Diagnosis not present

## 2020-02-06 DIAGNOSIS — R413 Other amnesia: Secondary | ICD-10-CM | POA: Diagnosis not present

## 2020-02-06 DIAGNOSIS — F419 Anxiety disorder, unspecified: Secondary | ICD-10-CM | POA: Diagnosis not present

## 2020-02-06 DIAGNOSIS — E039 Hypothyroidism, unspecified: Secondary | ICD-10-CM | POA: Diagnosis not present

## 2020-02-06 NOTE — Telephone Encounter (Signed)
[place]  Phone Number [phone] Fax [phone]  Tinnie Gens - nurse [phone]

## 2020-02-06 NOTE — Progress Notes (Signed)
Subjective:    Patient ID: [name], female    DOB: [birth year], 85 y.o.   MRN: [id number]  HPI 85 year old Female recently admitted to [facility] July 25 after syncopal episode at home.  History of pacemaker followed by cardiology.  Is on chronic anticoagulation for paroxysmal atrial fibrillation.  Longstanding history of memory issues and is followed by Neurology.  Treated with Namzaric.  During evaluation in the emergency department CT of the brain showed no evidence of intracranial hemorrhage.  Atrophy was noted.  Was transferred to skilled nursing facility after 5-day hospital stay.  Apparently patient had syncopal episode after eating lunch.  She got up from dinner table walk down the hall and reportedly fell onto the floor.  Was unresponsive approximately 1 minute according to her husband without seizure activity.  Was noted to be mildly orthostatic in the emergency department blood pressure 125/86 lying and standing was 111/54.  Appeared to have some mild volume depletion with creatinine 1.34.  This improved with IV hydration and repeat BUN on July 26 was 1.00.  Was discharged to skilled nursing facility [facility]) PT orders were requested from [place] at home from this office and these were provided.  Subsequently from there was admitted to [facility] memory unit on August 13.  Daughter says patient has done well there.   Review of Systems     Objective:   Physical Exam Neck is supple.  No carotid bruits.  Chest clear.  Cardiac exam mostly regular rate and rhythm with occasional extrasystole.  No murmur appreciated.  She is alert.  She mumbles gibberish.  Is pleasant and cooperative.  Not agitated.  Does not call me by my name.       Assessment & Plan:    Recent hospitalization for syncope-etiology for syncope not clear  Memory loss-currently at [facility] in memory care unit.  Was to have received physical therapy and Occupational Therapy  but daughter does not feel this is necessary and wants it discontinued.  This will be done.  Daughter reports patient does well with lorazepam 0.5 mg taken in early evening and sleeps well with taking it at that time.  This will be represcribed.  Seroquel will be discontinued at request of daughter.  This was one of her discharge medications from the hospital.  Daughter originally told me that patient wants to stay at [facility] temporarily until at home services could be secured which would be about a month.  However she recognizes her father is tired and has difficulty taking care of patient.  There is some talk about keeping her at [facility] or both patient and her husband moving to [facility].  Advised daughter that I do not make calls to nursing facilities or long-term care facilities.  We will try to manage patient temporarily working with staff at [facility].  Patient has osteoarthritis in her knees and appears to have some trouble ambulating particularly due to left knee turning somewhat inward.  Patient has history of atrial fibrillation on chronic anticoagulation therapy, history of pacemaker for high-grade AV block.  Has functioning [employer] dual-chamber pacemaker and followed by Dr. Lovena Le.  She has a history of hypercholesterolemia.  Sees neurologist for memory issues.  History of right hip fracture 2016 surgically repaired by Dr. Latanya Maudlin.  Daughter lives here in town and tries to check on her parents at least twice a week.  Daughter works for [employer] as a Teacher, early years/pre.  Patient has a son and a daughter who reside out of town.  Son resides in [place].  Patient's husband is 14 years old with remote history of prostate cancer.  He is noted today to have a shuffling gait and is a bit frail.  He is also a patient here.  Daughter was disturbed the patient was placed on Seroquel and that lorazepam was discontinued at time of hospital discharge.  Patient is on Namzaric  28/10 every 24 hours.  Seroquel 25 mg was ordered at hospital discharge to take at bedtime.  Apparently patient received some IM lorazepam in the hospital for agitation.  Oral lorazepam had been discontinued but was prescribed before hospitalization.  She was last seen here September 2020.  Patient is a retired Automotive engineer.  Husband is retired Teaching laboratory technician.  Non-smoker.  Currently no alcohol consumption.  Family history: Father died from colon cancer at age 69.  Mother died from colon blockage after surgery at age 72.  Brother with history of coronary artery disease and stent placement.    Plan: Per daughter's request, PT and OT will be discontinued.  Patient will be prescribed Ativan 0.5 mg in early evening.  Seroquel will be discontinued.Plan is to have her stay at [facility] for short time until [facility] can hire additional staff.

## 2020-02-07 ENCOUNTER — Telehealth: Payer: Self-pay | Admitting: Internal Medicine

## 2020-02-07 LAB — BASIC METABOLIC PANEL WITH GFR
BUN/Creatinine Ratio: 23 (calc) — ABNORMAL HIGH (ref 6–22)
BUN: 22 mg/dL (ref 7–25)
CO2: 24 mmol/L (ref 20–32)
Calcium: 9.2 mg/dL (ref 8.6–10.4)
Chloride: 104 mmol/L (ref 98–110)
Creat: 0.97 mg/dL — ABNORMAL HIGH (ref 0.60–0.88)
Glucose, Bld: 79 mg/dL (ref 65–99)
Potassium: 4.1 mmol/L (ref 3.5–5.3)
Sodium: 139 mmol/L (ref 135–146)

## 2020-02-07 LAB — CBC WITH DIFFERENTIAL/PLATELET
Absolute Monocytes: 847 cells/uL (ref 200–950)
Basophils Absolute: 51 cells/uL (ref 0–200)
Basophils Relative: 0.7 %
Eosinophils Absolute: 102 cells/uL (ref 15–500)
Eosinophils Relative: 1.4 %
HCT: 38.4 % (ref 35.0–45.0)
Hemoglobin: 12.7 g/dL (ref 11.7–15.5)
Lymphs Abs: 1504 cells/uL (ref 850–3900)
MCH: 30.8 pg (ref 27.0–33.0)
MCHC: 33.1 g/dL (ref 32.0–36.0)
MCV: 93.2 fL (ref 80.0–100.0)
MPV: 8.6 fL (ref 7.5–12.5)
Monocytes Relative: 11.6 %
Neutro Abs: 4796 cells/uL (ref 1500–7800)
Neutrophils Relative %: 65.7 %
Platelets: 320 10*3/uL (ref 140–400)
RBC: 4.12 10*6/uL (ref 3.80–5.10)
RDW: 12.2 % (ref 11.0–15.0)
Total Lymphocyte: 20.6 %
WBC: 7.3 10*3/uL (ref 3.8–10.8)

## 2020-02-07 NOTE — Telephone Encounter (Signed)
Faxed orders to [facility] [place]   D/C Scroquel Ativan 0.5mg  D/C OT & PT per family request  Also called [facility] at [facility] [phone] and let her know that family does not want PT and OT at this time.

## 2020-02-08 NOTE — Telephone Encounter (Signed)
Faxed clarification orders to [facility] [phone]

## 2020-02-16 ENCOUNTER — Ambulatory Visit (INDEPENDENT_AMBULATORY_CARE_PROVIDER_SITE_OTHER): Payer: Medicare PPO | Admitting: Internal Medicine

## 2020-02-16 ENCOUNTER — Encounter: Payer: Self-pay | Admitting: Internal Medicine

## 2020-02-16 ENCOUNTER — Other Ambulatory Visit: Payer: Self-pay

## 2020-02-16 VITALS — BP 110/70 | HR 92 | Temp 98.3°F | Ht 69.0 in | Wt 135.0 lb

## 2020-02-16 DIAGNOSIS — M25473 Effusion, unspecified ankle: Secondary | ICD-10-CM | POA: Diagnosis not present

## 2020-02-16 DIAGNOSIS — R413 Other amnesia: Secondary | ICD-10-CM | POA: Diagnosis not present

## 2020-02-16 DIAGNOSIS — R609 Edema, unspecified: Secondary | ICD-10-CM | POA: Diagnosis not present

## 2020-02-16 DIAGNOSIS — Z950 Presence of cardiac pacemaker: Secondary | ICD-10-CM | POA: Diagnosis not present

## 2020-02-16 DIAGNOSIS — Z8679 Personal history of other diseases of the circulatory system: Secondary | ICD-10-CM

## 2020-02-16 DIAGNOSIS — R600 Localized edema: Secondary | ICD-10-CM

## 2020-02-16 DIAGNOSIS — Z7901 Long term (current) use of anticoagulants: Secondary | ICD-10-CM | POA: Diagnosis not present

## 2020-02-16 DIAGNOSIS — E039 Hypothyroidism, unspecified: Secondary | ICD-10-CM | POA: Diagnosis not present

## 2020-02-16 LAB — CBC WITH DIFFERENTIAL/PLATELET
Absolute Monocytes: 980 cells/uL — ABNORMAL HIGH (ref 200–950)
Basophils Absolute: 32 cells/uL (ref 0–200)
Basophils Relative: 0.4 %
Eosinophils Absolute: 178 cells/uL (ref 15–500)
Eosinophils Relative: 2.2 %
HCT: 38.1 % (ref 35.0–45.0)
Hemoglobin: 12.9 g/dL (ref 11.7–15.5)
Lymphs Abs: 1547 cells/uL (ref 850–3900)
MCH: 32 pg (ref 27.0–33.0)
MCHC: 33.9 g/dL (ref 32.0–36.0)
MCV: 94.5 fL (ref 80.0–100.0)
MPV: 9.4 fL (ref 7.5–12.5)
Monocytes Relative: 12.1 %
Neutro Abs: 5362 cells/uL (ref 1500–7800)
Neutrophils Relative %: 66.2 %
Platelets: 268 10*3/uL (ref 140–400)
RBC: 4.03 10*6/uL (ref 3.80–5.10)
RDW: 12.2 % (ref 11.0–15.0)
Total Lymphocyte: 19.1 %
WBC: 8.1 10*3/uL (ref 3.8–10.8)

## 2020-02-16 NOTE — Patient Instructions (Addendum)
Start HCTZ 25 mg daily for dependent edema and follow-up on Monday, August 30.  CBC

## 2020-02-16 NOTE — Progress Notes (Signed)
   Subjective:    Patient ID: [name], female    DOB: September 30, 1934, 85 y.o.   MRN: [id number]  HPI 85 year old Female recently admitted to [facility] after hospitalization due to a fall versus syncope at home.  Patient was admitted and cardiac evaluation was completed.  She has a history of memory loss longstanding.  She has history of heart block and is status post pacemaker placement.  She has paroxysmal atrial fibrillation and is on Eliquis.  Did not have seizure-like activity.  After 5-day hospital stay, she was discharged to skilled nursing facility reportedly for physical therapy.  [facility] with hopes of being able to go home when [facility] is able to provide adequate staffing enzyme for the patient.  Patient lives with her elderly husband.  He needs some help at home with her now. Her daughter lives here in [place] and works for [employer].  We received a call from her daughter today saying that patient had gone to the hairdresser and advise she had hairdresser had noted that her mother's legs were swollen which was unusual for her.  Daughter was agreeable to patient coming to the office today.   Review of Systems patient denies chest pain or shortness of breath and is in no acute distress.  She currently is not on a diuretic.  She has history of hypothyroidism and has been taking levothyroxine 0.075 mg daily.  TSH was checked July 25 and was normal at 1.620.    Objective:   Physical Exam  Blood pressure 110/70 pulse 92 temperature 98.3 degrees pulse oximetry 98% weight 135 pounds height 5 feet 9 inches She is in no acute distress even though she has slightly swollen ankles and feet.  She is disoriented x3.  Neck is supple without JVD thyromegaly or carotid bruits.  Chest is clear.  Cardiac exam regular rate and rhythm with occasional extrasystole.  She has trace pitting edema of her ankles and feet.  CBC drawn today in office.  Hemoglobin  12.9 g.  White blood cell count 8100.  Basic metabolic panel drawn and office today shows BUN 22 and creatinine 0.97.  Sodium and potassium are normal.  [facility] and they note that she walks continuously about the facility.  She eats a regular diet.     Assessment & Plan:  My feeling is that she has dependent edema from hot weather being on her feet a lot and possibly from salt intake from diet.  I do not think she has congestive heart failure at the present time.  Memory loss-no change  Plan: Have ordered HCTZ 25 mg daily with follow-up early next week.  Time spent reviewing records and labs, contacting staff at [facility], evaluating patient and spending time explaining diagnosis and plan to daughter is 45 minutes.

## 2020-02-17 DIAGNOSIS — R609 Edema, unspecified: Secondary | ICD-10-CM | POA: Insufficient documentation

## 2020-02-19 ENCOUNTER — Other Ambulatory Visit: Payer: Self-pay

## 2020-02-19 ENCOUNTER — Ambulatory Visit (INDEPENDENT_AMBULATORY_CARE_PROVIDER_SITE_OTHER): Payer: Medicare PPO | Admitting: Internal Medicine

## 2020-02-19 ENCOUNTER — Encounter: Payer: Self-pay | Admitting: Internal Medicine

## 2020-02-19 VITALS — BP 98/70 | HR 68 | Ht 69.0 in | Wt 138.0 lb

## 2020-02-19 DIAGNOSIS — E039 Hypothyroidism, unspecified: Secondary | ICD-10-CM | POA: Diagnosis not present

## 2020-02-19 DIAGNOSIS — Z950 Presence of cardiac pacemaker: Secondary | ICD-10-CM

## 2020-02-19 DIAGNOSIS — R413 Other amnesia: Secondary | ICD-10-CM

## 2020-02-19 DIAGNOSIS — Z7901 Long term (current) use of anticoagulants: Secondary | ICD-10-CM | POA: Diagnosis not present

## 2020-02-19 DIAGNOSIS — G301 Alzheimer's disease with late onset: Secondary | ICD-10-CM | POA: Diagnosis not present

## 2020-02-19 DIAGNOSIS — Z8679 Personal history of other diseases of the circulatory system: Secondary | ICD-10-CM

## 2020-02-19 DIAGNOSIS — R609 Edema, unspecified: Secondary | ICD-10-CM

## 2020-02-19 DIAGNOSIS — F0280 Dementia in other diseases classified elsewhere without behavioral disturbance: Secondary | ICD-10-CM | POA: Diagnosis not present

## 2020-02-19 NOTE — Progress Notes (Signed)
   Subjective:    Patient ID: [name], female    DOB: 1935-02-05, 85 y.o.   MRN: [id number]  HPI 85 year old Female just seen Friday, August 27 with by lower extremity edema which was new.  It seems that she may be walking a lot at [facility] facility or sitting with her feet down much of the time.  She is not short of breath.  She has Alzheimer's dementia.  She is on chronic anticoagulation.  History of hypothyroidism.  She is on Namzaric per Neurology.  She is accompanied by her husband, [name], today.  Her husband hopes to get her into some sort of daycare program at [facility].  Apparently a bus will come to a location here in [place] and take her for the day [facility] activities.  She will also have assistance at home from [facility] [employer] pending their hiring new employees.  For now, she will be at [facility].  When she was seen on Friday, August 27 she was not in congestive heart failure.  We had planned to draw potassium today but husband seems to want to hold off on that.  She is now on HCTZ 25 mg daily.  She has an appointment here in late September for health maintenance exam and we can follow-up at that time.  She has hypothyroidism and recent TSH was found to be normal.  Review of Systems see above     Objective:   Physical Exam Blood pressure 98/70 pulse 68 pulse oximetry 96% weight 138 pounds BMI 20.38  She is sitting in chair and is conversant but does not make a lot of sense with her conversation.  Neck is supple.  Chest clear to auscultation.  Cardiac exam regular rate and rhythm.  Pitting edema of the lower extremities has improved.  There is only trace pitting edema today.       Assessment & Plan:  Dependent edema improved with HCTZ  Plan: Follow-up in late September for Medicare wellness and health maintenance exam as well as following up on dependent edema.

## 2020-02-19 NOTE — Patient Instructions (Signed)
Continue HCTZ 25 mg daily.  Fasting labs will be done for health maintenance exam in late September.  Continue other medications as previously prescribed.

## 2020-02-20 NOTE — Patient Instructions (Signed)
Ativan prescribed for anxiety and sleep. Discontinue Seroquel. Anticipate short stay at [facility] . Discontinue OT and PT

## 2020-02-21 ENCOUNTER — Ambulatory Visit (INDEPENDENT_AMBULATORY_CARE_PROVIDER_SITE_OTHER): Payer: Medicare PPO | Admitting: *Deleted

## 2020-02-21 DIAGNOSIS — I442 Atrioventricular block, complete: Secondary | ICD-10-CM

## 2020-02-21 LAB — CUP PACEART REMOTE DEVICE CHECK
Battery Remaining Longevity: 83 mo
Battery Remaining Percentage: 81 %
Battery Voltage: 2.93 V
Brady Statistic AP VP Percent: 16 %
Brady Statistic AP VS Percent: 3.6 %
Brady Statistic AS VP Percent: 32 %
Brady Statistic AS VS Percent: 45 %
Brady Statistic RA Percent Paced: 18 %
Brady Statistic RV Percent Paced: 48 %
Date Time Interrogation Session: 20210830163139
Implantable Lead Implant Date: 20140613
Implantable Lead Implant Date: 20140613
Implantable Lead Location: 753859
Implantable Lead Location: 753860
Implantable Pulse Generator Implant Date: 20140613
Lead Channel Impedance Value: 330 Ohm
Lead Channel Impedance Value: 340 Ohm
Lead Channel Pacing Threshold Amplitude: 0.5 V
Lead Channel Pacing Threshold Amplitude: 1.25 V
Lead Channel Pacing Threshold Pulse Width: 0.5 ms
Lead Channel Pacing Threshold Pulse Width: 0.5 ms
Lead Channel Sensing Intrinsic Amplitude: 11.2 mV
Lead Channel Sensing Intrinsic Amplitude: 3.4 mV
Lead Channel Setting Pacing Amplitude: 2 V
Lead Channel Setting Pacing Amplitude: 2.5 V
Lead Channel Setting Pacing Pulse Width: 0.5 ms
Lead Channel Setting Sensing Sensitivity: 5 mV
Pulse Gen Model: 2210
Pulse Gen Serial Number: 7474456

## 2020-02-22 ENCOUNTER — Telehealth: Payer: Self-pay | Admitting: Internal Medicine

## 2020-02-22 DIAGNOSIS — Z0289 Encounter for other administrative examinations: Secondary | ICD-10-CM

## 2020-02-22 NOTE — Telephone Encounter (Signed)
Faxed signed state documents that are required to [phone] to [facility] at [place]

## 2020-02-22 NOTE — Telephone Encounter (Signed)
[name] dropped off form to be filled out for patient to attend Adult Day Care when she comes home from [facility].

## 2020-02-22 NOTE — Telephone Encounter (Signed)
Form has been completed and mailed to patients home

## 2020-02-23 NOTE — Progress Notes (Signed)
Remote pacemaker transmission.   

## 2020-02-27 ENCOUNTER — Telehealth: Payer: Self-pay | Admitting: Internal Medicine

## 2020-02-27 ENCOUNTER — Telehealth (INDEPENDENT_AMBULATORY_CARE_PROVIDER_SITE_OTHER): Payer: Medicare PPO | Admitting: Internal Medicine

## 2020-02-27 DIAGNOSIS — R413 Other amnesia: Secondary | ICD-10-CM | POA: Diagnosis not present

## 2020-02-27 NOTE — Telephone Encounter (Signed)
Husband called and said [name] is billing him for Seroquel  and he was concerned wife still receiving it. [facility] staff say that it was discontinued on August 18th but has 5 tabs left in med drawer that should be returned. Overnight staff receives these meds according to staff. I will fax [facility] but patient's husband has to call [facility] about the charges. Time spent calling staff, communicating with husband and Faxing [name] is 20 minutes.

## 2020-02-27 NOTE — Telephone Encounter (Signed)
I called them and they said they discontinued after August 18th. They have 5 tablets left in her  medication drawer they should return. They want him to call [facility] at [phone] and [facility] Fax  number is [phone]. I will send fax to [facility] but he should call them about his bill.

## 2020-02-27 NOTE — Telephone Encounter (Signed)
Patient was notified, provided him with [facility] phone number.

## 2020-02-27 NOTE — Telephone Encounter (Signed)
Pt husband called and said that pt is at [facility] and said that he received a statement showing the medications that she has been receiving and it said that they are still giving her Seriquil even though Dr Renold Genta told them not to. He asked if Dr Renold Genta could call them and talk to them about it. There PN: [phone]

## 2020-03-06 ENCOUNTER — Other Ambulatory Visit: Payer: Self-pay

## 2020-03-06 MED ORDER — LORAZEPAM 0.5 MG PO TABS: 0.5000 mg | ORAL_TABLET | Freq: Two times a day (BID) | ORAL | 0 refills | Status: DC

## 2020-03-06 NOTE — Telephone Encounter (Signed)
Patient's husband called to request a refill on ATIVAN.

## 2020-03-12 ENCOUNTER — Other Ambulatory Visit: Payer: Medicare PPO | Admitting: Internal Medicine

## 2020-03-18 ENCOUNTER — Other Ambulatory Visit: Payer: Medicare PPO | Admitting: Internal Medicine

## 2020-03-18 ENCOUNTER — Other Ambulatory Visit: Payer: Self-pay

## 2020-03-18 DIAGNOSIS — E039 Hypothyroidism, unspecified: Secondary | ICD-10-CM | POA: Diagnosis not present

## 2020-03-18 DIAGNOSIS — F0280 Dementia in other diseases classified elsewhere without behavioral disturbance: Secondary | ICD-10-CM | POA: Diagnosis not present

## 2020-03-18 DIAGNOSIS — Z0000 Encounter for general adult medical examination without abnormal findings: Secondary | ICD-10-CM | POA: Diagnosis not present

## 2020-03-18 DIAGNOSIS — M1812 Unilateral primary osteoarthritis of first carpometacarpal joint, left hand: Secondary | ICD-10-CM | POA: Diagnosis not present

## 2020-03-18 DIAGNOSIS — R609 Edema, unspecified: Secondary | ICD-10-CM | POA: Diagnosis not present

## 2020-03-18 DIAGNOSIS — G309 Alzheimer's disease, unspecified: Secondary | ICD-10-CM | POA: Diagnosis not present

## 2020-03-18 LAB — LIPID PANEL
Cholesterol: 247 mg/dL — ABNORMAL HIGH (ref <200)
HDL: 69 mg/dL (ref >=50)
LDL Cholesterol (Calc): 154 mg/dL (calc) — ABNORMAL HIGH
Non-HDL Cholesterol (Calc): 178 mg/dL (calc) — ABNORMAL HIGH (ref <130)
Total CHOL/HDL Ratio: 3.6 (calc) (ref <5.0)
Triglycerides: 120 mg/dL (ref <150)

## 2020-03-18 LAB — CBC WITH DIFFERENTIAL/PLATELET
Absolute Monocytes: 585 cells/uL (ref 200–950)
Basophils Absolute: 41 cells/uL (ref 0–200)
Basophils Relative: 0.6 %
Eosinophils Absolute: 354 cells/uL (ref 15–500)
Eosinophils Relative: 5.2 %
HCT: 43.3 % (ref 35.0–45.0)
Hemoglobin: 14.3 g/dL (ref 11.7–15.5)
Lymphs Abs: 1020 cells/uL (ref 850–3900)
MCH: 31 pg (ref 27.0–33.0)
MCHC: 33 g/dL (ref 32.0–36.0)
MCV: 93.7 fL (ref 80.0–100.0)
MPV: 8.8 fL (ref 7.5–12.5)
Monocytes Relative: 8.6 %
Neutro Abs: 4801 cells/uL (ref 1500–7800)
Neutrophils Relative %: 70.6 %
Platelets: 263 10*3/uL (ref 140–400)
RBC: 4.62 10*6/uL (ref 3.80–5.10)
RDW: 12.1 % (ref 11.0–15.0)
Total Lymphocyte: 15 %
WBC: 6.8 10*3/uL (ref 3.8–10.8)

## 2020-03-18 LAB — COMPLETE METABOLIC PANEL WITH GFR
AG Ratio: 1.5 (calc) (ref 1.0–2.5)
ALT: 8 U/L (ref 6–29)
AST: 17 U/L (ref 10–35)
Albumin: 4 g/dL (ref 3.6–5.1)
Alkaline phosphatase (APISO): 64 U/L (ref 37–153)
BUN/Creatinine Ratio: 30 (calc) — ABNORMAL HIGH (ref 6–22)
BUN: 30 mg/dL — ABNORMAL HIGH (ref 7–25)
CO2: 27 mmol/L (ref 20–32)
Calcium: 9.3 mg/dL (ref 8.6–10.4)
Chloride: 104 mmol/L (ref 98–110)
Creat: 0.99 mg/dL — ABNORMAL HIGH (ref 0.60–0.88)
GFR, Est African American: 61 mL/min/{1.73_m2} (ref >=60)
GFR, Est Non African American: 53 mL/min/{1.73_m2} — ABNORMAL LOW (ref >=60)
Globulin: 2.6 g/dL (calc) (ref 1.9–3.7)
Glucose, Bld: 82 mg/dL (ref 65–99)
Potassium: 4.4 mmol/L (ref 3.5–5.3)
Sodium: 140 mmol/L (ref 135–146)
Total Bilirubin: 0.4 mg/dL (ref 0.2–1.2)
Total Protein: 6.6 g/dL (ref 6.1–8.1)

## 2020-03-18 LAB — TSH: TSH: 3.47 mIU/L (ref 0.40–4.50)

## 2020-03-19 ENCOUNTER — Ambulatory Visit: Payer: Medicare PPO | Admitting: Internal Medicine

## 2020-03-19 ENCOUNTER — Encounter: Payer: Self-pay | Admitting: Internal Medicine

## 2020-03-19 VITALS — BP 110/70 | Ht 69.0 in | Wt 134.0 lb

## 2020-03-19 DIAGNOSIS — R609 Edema, unspecified: Secondary | ICD-10-CM | POA: Diagnosis not present

## 2020-03-19 DIAGNOSIS — Z23 Encounter for immunization: Secondary | ICD-10-CM

## 2020-03-19 DIAGNOSIS — Z950 Presence of cardiac pacemaker: Secondary | ICD-10-CM

## 2020-03-19 DIAGNOSIS — R413 Other amnesia: Secondary | ICD-10-CM

## 2020-03-19 DIAGNOSIS — F0280 Dementia in other diseases classified elsewhere without behavioral disturbance: Secondary | ICD-10-CM

## 2020-03-19 DIAGNOSIS — E039 Hypothyroidism, unspecified: Secondary | ICD-10-CM

## 2020-03-19 DIAGNOSIS — Z7901 Long term (current) use of anticoagulants: Secondary | ICD-10-CM

## 2020-03-19 DIAGNOSIS — Z0000 Encounter for general adult medical examination without abnormal findings: Secondary | ICD-10-CM

## 2020-03-19 DIAGNOSIS — Z8679 Personal history of other diseases of the circulatory system: Secondary | ICD-10-CM

## 2020-03-20 MED ORDER — HYDROCHLOROTHIAZIDE 25 MG PO TABS: 25.0000 mg | ORAL_TABLET | Freq: Every day | ORAL | 0 refills | Status: AC

## 2020-03-20 NOTE — Patient Instructions (Addendum)
Flu vaccine given today.  Continue current medications.  Husband says they will be moving to [facility] in the near future.  Will likely require paperwork to be completed for that wound.  Continue lorazepam prior to bedtime for anxiety and agitation.  This helps with sleep.  No change in medications.  Follow-up in 1 year or as needed.  Continue anticoagulation for history of PAF and history of pacemaker.  Continue thyroid replacement

## 2020-03-20 NOTE — Progress Notes (Signed)
Subjective:    Patient ID: [name], female    DOB: 06-24-1934, 85 y.o.   MRN: [id number]  HPI 85 year old Female here for medicare wellness, health maintenance exam and evaluation of medical issues.  She is accompanied by her husband.  She is now back in her own home with her husband.  Daughter is supportive and checks in with him frequently.  Previously was staying short-term at [facility] until in-home health could be obtained from [facility].  Husband tells me that he plans to keep his home but that they will be moving to memory care at [facility] in the near future.  She has a history of mild dependent edema and currently is taking HCTZ 25 mg daily.  This will be continued.  She was hospitalized in July for 5 days after having a syncopal episode at home.  She was unresponsive for approximately 1 minute without seizure activity.  CT did not show any acute intracranial abnormality just atrophy and mild ventriculomegaly.  She was admitted for evaluation of syncope and had a 5-day stay.  She was discharged to skilled nursing facility before going to [facility].  She has a pacemaker and it was interrogated without finding any issues that needed attention.  Echocardiogram done during hospitalization showed no gross valvular abnormalities.  She is on Eliquis for paroxysmal atrial fib.  She has severe dementia but is pleasant and cooperative.  She is on Namzaric.  Has Ativan ordered if needed for agitation twice daily.  History of hypothyroidism treated with levothyroxine 0.075 mg daily and is on chronic anticoagulation for PAF consisting of Eliquis 2.5 mg twice daily.  History of right hip fracture 2016 requiring right hip arthroplasty for acute right transverse femoral fracture.  She has a history of right ovarian cystectomy and appendectomy 1965 for endometriosis.  Had tumor removed from her abdomen in 1985.  Had polyp removed from cervical orifice in 1996.  Apparently there was a  question of a right pelvic mass in 2014, seen by GYN when she was complaining of right pelvic pain but ultrasound did not show presence of right ovary or mass.  Longstanding history of urinary frequency and nocturia.  Her husband brought issue memories to my attention in 2014.  History of dizziness and left-sided weakness in 2011 thought possibly to have a TIA.  MRI showed chronic microvascular ischemia.  Patient does not want mammogram or any colon cancer screening.  She gets annual flu vaccine.  Had colonoscopy May 2013 showing hyperplastic polyp.  In 2007 she had colonoscopy and had 2 tubular adenomas removed.  Social history: Husband is retired Teaching laboratory technician and is very supportive.  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  Rare alcohol consumption.  Non-smoker.  3 children, 1 son and 1 daughter are adopted.  They have 1 daughter who is her natural child.  In 1997 patient was diagnosed with depression by Dr Lajuana Ripple and was started on Paxil for about a year.  In 2001 she had reconstruction of left thumb CMC joint by Dr. Daylene Katayama.  Family history: Father died from colon cancer at age 80.  Mother died from colon blockage after surgery at age 26.  Brother with history of coronary artery disease with stent placement.   Review of Systems  Respiratory: Negative.   Cardiovascular: Negative.   Gastrointestinal: Negative.   Genitourinary: Negative.   Neurological:       Severe dementia  Objective:   Physical Exam Vitals reviewed.  Constitutional:      Appearance: Normal appearance.  HENT:     Head: Normocephalic and atraumatic.     Right Ear: Tympanic membrane normal.     Left Ear: Tympanic membrane normal.     Nose: Nose normal.  Eyes:     General: No scleral icterus.       Right eye: No discharge.        Left eye: No discharge.     Extraocular Movements: Extraocular movements intact.     Pupils: Pupils are  equal, round, and reactive to light.  Neck:     Vascular: No carotid bruit.  Cardiovascular:     Rate and Rhythm: Normal rate and regular rhythm.  Pulmonary:     Effort: No respiratory distress.     Breath sounds: Normal breath sounds. No rales.  Abdominal:     Palpations: Abdomen is soft. There is no mass.     Tenderness: There is no abdominal tenderness. There is no guarding or rebound.  Musculoskeletal:     Cervical back: Neck supple. No rigidity.  Lymphadenopathy:     Cervical: No cervical adenopathy.  Skin:    General: Skin is warm and dry.  Neurological:     General: No focal deficit present.     Mental Status: She is alert. She is disoriented.     Cranial Nerves: No cranial nerve deficit.     Coordination: Coordination normal.  Psychiatric:        Mood and Affect: Mood normal.           Assessment & Plan:  Memory loss followed by neurology and is on Namzaric  History of complete heart block and has pacemaker  Chronic anticoagulation for paroxysmal atrial fibrillation  Hypothyroidism-stable on current dose of thyroid replacement medication.  TSH is normal.  History of hip fracture in the remote past and has been on Fosamax but currently is not on this.  Has lorazepam on hand if needed for anxiety  Pure hypercholesterolemia.  Total cholesterol 247 and LDL cholesterol 154.  At her age is severe dementia we will not pursue statin medication.   Dependent edema.  Potassium normal on HCTZ without potassium supplementation.  Plan: Return in 1 year or as needed.  Husband indicates they will be moving to memory care and [facility] in the near future.  Continue follow-up with neurology and cardiology.  Flu vaccine given today.  Subjective:   Patient presents for Medicare Annual/Subsequent preventive examination.  Review Past Medical/Family/Social: See above   Risk Factors  Current exercise habits: Sedentary Dietary issues discussed: Low-fat low carbohydrate  discussed  Cardiac risk factors: See above-brother with history of coronary disease  Depression Screen-answered by husband (Note: if answer to either of the following is "Yes", a more complete depression screening is indicated)   Over the past two weeks, have you felt down, depressed or hopeless? No  Over the past two weeks, have you felt little interest or pleasure in doing things? No Have you lost interest or pleasure in daily life? No Do you often feel hopeless? No Do you cry easily over simple problems? No   Activities of Daily Living  In your present state of health, do you have any difficulty performing the following activities?:   Driving? No longer drives Managing money? No longer manages finances Feeding yourself?  Yes at times Getting from bed to chair?  Needs help Climbing a flight of stairs?  Needs  help Preparing food and eating?:  Does not do this for herself has helped Bathing or showering?  Needs help Getting dressed: Needs help Getting to the toilet?  Needs help Using the toilet:No  Moving around from place to place: Yes ambulates slowly In the past year have you fallen or had a near fall?:No  Are you sexually active? No  Do you have more than one partner? No   Hearing Difficulties: No  Do you often ask people to speak up or repeat themselves?  Yes sometimes Do you experience ringing or noises in your ears? No  Do you have difficulty understanding soft or whispered voices? No  Do you feel that you have a problem with memory?  He has severe dementia per husband Do you often misplace items?  Yes according to husband   Home Safety:  Do you have a smoke alarm at your residence? Yes Do you have grab bars in the bathroom?  No Do you have throw rugs in your house?  Yes   Cognitive Testing  Alert? Yes Normal Appearance?Yes  Oriented to person? Yes Place?  No Time?  No  recall of three objects? Yes  Can perform simple calculations?  Not tested-I do not believe  she can do this Displays appropriate judgment?  Hard to judge Can read the correct time from a watch face?  Not tested  List the Names of Other Physician/Practitioners you currently use:  See referral list for the physicians patient is currently seeing.     Review of Systems: See above Objective:     General appearance: Appears stated age and cooperative Head: Normocephalic, without obvious abnormality, atraumatic  Eyes: conj clear, EOMi PEERLA  Ears: normal TM's and external ear canals both ears  Nose: Nares normal. Septum midline. Mucosa normal. No drainage or sinus tenderness.  Throat: lips, mucosa, and tongue normal; teeth and gums normal  Neck: no adenopathy, no carotid bruit, no JVD, supple, symmetrical, trachea midline and thyroid not enlarged, symmetric, no tenderness/mass/nodules  No CVA tenderness.  Lungs: clear to auscultation bilaterally  Breasts: normal appearance, no masses or tenderness Heart: regular rate and rhythm, S1, S2 normal, no murmur, click, rub or gallop  Abdomen: soft, non-tender; bowel sounds normal; no masses, no organomegaly  Musculoskeletal: ROM normal in all joints, no crepitus, no deformity, Normal muscle strengthen. Back  is symmetric, no curvature. Skin: Skin color, texture, turgor normal. No rashes or lesions  Lymph nodes: Cervical, supraclavicular, and axillary nodes normal.  Neurologic: CN 2 -12 Normal, Normal symmetric reflexes. Normal coordination and gait  Psych: Alert & Oriented x 3, Mood appear stable.    Assessment:    Annual wellness medicare exam   Plan:    During the course of the visit the patient was educated and counseled about appropriate screening and preventive services including:   Annual flu vaccine.  Have Covid vaccine booster when available     Patient Instructions (the written plan) was given to the patient.  Medicare Attestation  I have personally reviewed:  The patient's medical and social history  Their use  of alcohol, tobacco or illicit drugs  Their current medications and supplements  The patient's functional ability including ADLs,fall risks, home safety risks, cognitive, and hearing and visual impairment  Diet and physical activities  Evidence for depression or mood disorders  The patient's weight, height, BMI, and visual acuity have been recorded in the chart. I have made referrals, counseling, and provided education to the patient based on review  of the above and I have provided the patient with a written personalized care plan for preventive services.

## 2020-04-12 ENCOUNTER — Other Ambulatory Visit: Payer: Self-pay

## 2020-04-12 ENCOUNTER — Ambulatory Visit: Payer: Medicare PPO | Admitting: Podiatry

## 2020-04-12 ENCOUNTER — Encounter: Payer: Self-pay | Admitting: Podiatry

## 2020-04-12 DIAGNOSIS — M79675 Pain in left toe(s): Secondary | ICD-10-CM | POA: Diagnosis not present

## 2020-04-12 DIAGNOSIS — B351 Tinea unguium: Secondary | ICD-10-CM | POA: Diagnosis not present

## 2020-04-12 DIAGNOSIS — M79674 Pain in right toe(s): Secondary | ICD-10-CM | POA: Diagnosis not present

## 2020-04-14 ENCOUNTER — Encounter: Payer: Self-pay | Admitting: Podiatry

## 2020-04-14 NOTE — Progress Notes (Signed)
Subjective:  Patient ID: [name], female    DOB: Dec 13, 1934,  MRN: [id number]  [name] presents to clinic today for painful thick toenails that are difficult to trim. Pain interferes with ambulation. Aggravating factors include wearing enclosed shoe gear. Pain is relieved with periodic professional debridement..  She has h/o memory problems. Her daughter is present during today's visit.   Review of Systems: Negative except as noted in the HPI. Past Medical History:  Diagnosis Date  . A-fib ([place])   . Allergy   . Arthritis   . Asthma   . Colon polyps   . Complete heart block -intermittent    a. 11/2012 s/p SJM Accent DR RF dc ppm.  . Depression   . Environmental allergies   . Hyperlipidemia   . Memory retention disorder 03/22/2013  . Osteoarthritis    hands  . Syncope 12/2019  . Thyroid disease    Past Surgical History:  Procedure Laterality Date  . APPENDECTOMY  1965  . CERVICAL POLYPECTOMY  1996  . COLONOSCOPY  2007   adenomatous polyps  . EYE SURGERY    . HAND SURGERY    . HIP ARTHROPLASTY Right 01/03/2015   Procedure: ARTHROPLASTY BIPOLAR HIP (HEMIARTHROPLASTY);  Surgeon: [name], MD;  Location: [facility];  Service: Orthopedics;  Laterality: Right;  . INSERT / REPLACE / REMOVE PACEMAKER  12/02/2012    Dr Lovena Le  . KNEE SURGERY    . ovarian cystectomy  1965  . PERMANENT PACEMAKER INSERTION N/A 12/02/2012   Procedure: PERMANENT PACEMAKER INSERTION;  Surgeon: Evans Lance, MD;  Location: [facility] CATH LAB;  Service: Cardiovascular;  Laterality: N/A;  . SALPINGOOPHORECTOMY     right  . tumor removed  1985   abdomen    Current Outpatient Medications:  .  acetaminophen (TYLENOL) 325 MG tablet, Take 650 mg by mouth every 6 (six) hours as needed (pain)., Disp: , Rfl:  .  b complex vitamins tablet, Take 1 tablet by mouth daily., Disp: , Rfl:  .  Calcium Citrate 250 MG TABS, Take 250 mg by mouth daily., Disp: , Rfl:  .  Cholecalciferol (VITAMIN D3) 125 MCG (5000 UT)  TABS, Take 5,000 Units by mouth daily., Disp: , Rfl:  .  docusate sodium (COLACE) 100 MG capsule, Take 100 mg by mouth 2 (two) times daily., Disp: , Rfl:  .  ELIQUIS 2.5 MG TABS tablet, TAKE 1 TABLET(2.5 MG) BY MOUTH TWICE DAILY, Disp: 60 tablet, Rfl: 11 .  hydrochlorothiazide (HYDRODIURIL) 25 MG tablet, Take 1 tablet (25 mg total) by mouth daily., Disp: 90 tablet, Rfl: 0 .  levothyroxine (SYNTHROID, LEVOTHROID) 75 MCG tablet, Take 75 mcg by mouth daily before breakfast. , Disp: , Rfl:  .  LORazepam (ATIVAN) 0.5 MG tablet, Take 1 tablet (0.5 mg total) by mouth 2 (two) times daily., Disp: 60 tablet, Rfl: 0 .  Memantine HCl-Donepezil HCl (NAMZARIC) 28-10 MG CP24, Take 1 capsule by mouth at bedtime., Disp: 90 capsule, Rfl: 3 Allergies  Allergen Reactions  . Sulfa Antibiotics Diarrhea and Rash  . Sulfasalazine Diarrhea and Rash   Social History   Occupational History  . Occupation: elem. school teacher    Comment: retired  Tobacco Use  . Smoking status: Never Smoker  . Smokeless tobacco: Never Used  Vaping Use  . Vaping Use: Never used  Substance and Sexual Activity  . Alcohol use: Yes    Alcohol/week: 0.0 standard drinks    Comment: occas. which is seldom  . Drug  use: No  . Sexual activity: Not Currently    Objective:   Constitutional [name] is a pleasant 85 y.o. Caucasian female, WD, WN in NAD.Marland Kitchen AAO x 3.   Vascular Dorsalis pedis pulses palpable bilaterally. Posterior tibial pulses palpable bilaterally. Capillary refill normal to all digits.  No cyanosis or clubbing noted. Pedal hair growth absent b/l.  No pedal edema b/l.   Neurologic Normal speech. Protective sensation intact with 10 gram monofilament b/l. Epicritic sensation to light touch grossly present bilaterally.  Dermatologic Pedal skin with normal turgor, texture and tone b/l.  Nails thick, elongated, dystrophic with pain on palpation x 10. No open wounds. No skin lesions.  Orthopedic: Normal joint ROM without  pain or crepitus bilaterally. No visible deformities. No bony tenderness.   Radiographs: None Assessment:   1. Pain due to onychomycosis of toenails of both feet    Plan:  Patient was evaluated and treated and all questions answered.  Onychomycosis with pain -Nails palliatively debridement as below -Educated on self-care  Procedure: Nail Debridement Rationale: Pain Type of Debridement: manual, sharp debridement. Instrumentation: Nail nipper, rotary burr. Number of Nails: 10 -Examined patient. -No new findings. No new orders. -Toenails 1-5 b/l were debrided in length and girth with sterile nail nippers and dremel without iatrogenic bleeding.  -Continue to use compounded topical antifungal solution from [place] daily on toenails as patient permits. -Patient to report any pedal injuries to medical professional immediately. -Patient to continue soft, supportive shoe gear daily. -Patient/POA to call should there be question/concern in the interim.  Return in about 3 months (around 07/13/2020).  Marzetta Board, DPM

## 2020-04-18 ENCOUNTER — Encounter: Payer: Self-pay | Admitting: Internal Medicine

## 2020-04-18 ENCOUNTER — Other Ambulatory Visit: Payer: Self-pay

## 2020-04-18 ENCOUNTER — Telehealth: Payer: Self-pay | Admitting: Internal Medicine

## 2020-04-18 ENCOUNTER — Ambulatory Visit: Payer: Medicare PPO | Admitting: Internal Medicine

## 2020-04-18 VITALS — BP 120/60 | HR 84 | Temp 98.4°F | Ht 69.0 in | Wt 129.0 lb

## 2020-04-18 DIAGNOSIS — R238 Other skin changes: Secondary | ICD-10-CM | POA: Diagnosis not present

## 2020-04-18 MED ORDER — CLOTRIMAZOLE-BETAMETHASONE 1-0.05 % EX CREA: 1.0000 "application " | TOPICAL_CREAM | Freq: Two times a day (BID) | CUTANEOUS | 0 refills | Status: DC

## 2020-04-18 NOTE — Telephone Encounter (Signed)
Scheduled

## 2020-04-18 NOTE — Telephone Encounter (Signed)
OK 

## 2020-04-18 NOTE — Patient Instructions (Signed)
Patient has superficial skin between buttocks.  Apply Lotrisone cream sparingly to skin breakdown area twice daily until healed.  Monitor for worsening.

## 2020-04-18 NOTE — Progress Notes (Signed)
   Subjective:    Patient ID: [name], female    DOB: [birth year], 85 y.o.   MRN: [id number]  HPI 85 year old Female brought in by husband and caretaker. They were concerned about a "knot" found by caretaker between buttocks. Patient has severe dementia and being cared for by home health agency at home.She was just seen here in late September/ this was not mentioned nor noted at that time so this is a recent finding.    Review of Systems see above     Objective:   Physical Exam BP 120/60, pulse 84, T 98.4 degrees, pulse ox 97%,  Weight 129 pounds. Has lost 5 pounds since visit Sept 28.  She has an area of superficial skin breakdown superior aspect of buttock at 11 o,clock No evidence of secondary infection. Keep area clean and dry. She wears diapers. Area measure about 6 mm in length and is vertical.     Assessment & Plan:    Skin breakdown between buttocks. Home health agency to monitor. This is superficial. Apply Lotrisone cream twice daily until healed. Call if skin breakdown worsens.

## 2020-04-18 NOTE — Telephone Encounter (Signed)
[name] [phone]  [name] called to say that the care giver has found a bump between [name]'s buttocks that is about the size of a grape. You can just feel it, you can not see it. He would like to bring her in for you to look at it and let him know what to do.

## 2020-04-20 ENCOUNTER — Ambulatory Visit: Payer: Medicare PPO

## 2020-04-22 DIAGNOSIS — M25561 Pain in right knee: Secondary | ICD-10-CM | POA: Diagnosis not present

## 2020-04-25 ENCOUNTER — Other Ambulatory Visit: Payer: Self-pay | Admitting: Cardiovascular Disease

## 2020-04-26 NOTE — Telephone Encounter (Signed)
Prescription refill request for Eliquis received.  Last office visit: N[place] 05/16/2019 Scr: 0.99, 03/18/2020 Age: 85 yo Weight: 58.5 kg   Prescription refill sent.

## 2020-04-30 DIAGNOSIS — M810 Age-related osteoporosis without current pathological fracture: Secondary | ICD-10-CM | POA: Diagnosis not present

## 2020-05-03 ENCOUNTER — Ambulatory Visit (INDEPENDENT_AMBULATORY_CARE_PROVIDER_SITE_OTHER): Payer: Medicare PPO | Admitting: Internal Medicine

## 2020-05-03 ENCOUNTER — Other Ambulatory Visit: Payer: Self-pay

## 2020-05-03 ENCOUNTER — Telehealth: Payer: Self-pay | Admitting: Internal Medicine

## 2020-05-03 DIAGNOSIS — G301 Alzheimer's disease with late onset: Secondary | ICD-10-CM

## 2020-05-03 DIAGNOSIS — R300 Dysuria: Secondary | ICD-10-CM | POA: Diagnosis not present

## 2020-05-03 DIAGNOSIS — R8290 Unspecified abnormal findings in urine: Secondary | ICD-10-CM

## 2020-05-03 DIAGNOSIS — F0280 Dementia in other diseases classified elsewhere without behavioral disturbance: Secondary | ICD-10-CM

## 2020-05-03 LAB — POCT URINALYSIS DIPSTICK
Appearance: ABNORMAL
Bilirubin, UA: NEGATIVE
Glucose, UA: NEGATIVE
Ketones, UA: NEGATIVE
Nitrite, UA: POSITIVE
Odor: ABNORMAL
Protein, UA: POSITIVE — AB
Spec Grav, UA: >=1.03 — AB (ref 1.010–1.025)
Urobilinogen, UA: 0.2 E.U./dL
pH, UA: 5 (ref 5.0–8.0)

## 2020-05-03 MED ORDER — NITROFURANTOIN MONOHYD MACRO 100 MG PO CAPS: 100.0000 mg | ORAL_CAPSULE | Freq: Two times a day (BID) | ORAL | 0 refills | Status: DC

## 2020-05-03 NOTE — Telephone Encounter (Signed)
[name] will come to the office to pick up a sterile cup and bring it back.

## 2020-05-03 NOTE — Telephone Encounter (Signed)
[name] [phone]  [name] called to say that the care giver is saying that [name] is complaining of burning when she goes to the bathroom, she is not going often and it is not dark in color.

## 2020-05-03 NOTE — Patient Instructions (Signed)
No charge for visit today as patient not seen in person or virtually. Spoke to husband by phone. Pt has severe dementia.

## 2020-05-03 NOTE — Telephone Encounter (Signed)
Can they get [name] a clean catch urine specimen this afternoon and bring it in?

## 2020-05-03 NOTE — Progress Notes (Signed)
Phone call from patient's husband that patient was having some dysuria according to caretaker who is helping patient at home. Patient has severe dementia. It is difficult to get her to the office. We asked they they get a clean catch urine specimen or at least try to do that.Mr. Madrigal brought specimen to office.SG greater than 1.030. Has protein and nitrite as well as trace LE. Will call in Macrobid 100 mg twice a day for 5 days pending culture results.Told Mr. Wunschel that patients needs to drink more fluids given high SG.

## 2020-05-05 LAB — URINE CULTURE
MICRO NUMBER:: 11197080
SPECIMEN QUALITY:: ADEQUATE

## 2020-05-05 LAB — URINALYSIS, MICROSCOPIC ONLY: Hyaline Cast: NONE SEEN /LPF

## 2020-05-09 NOTE — Telephone Encounter (Signed)
We can check another specimen BUT it has to be a clean catch

## 2020-05-09 NOTE — Telephone Encounter (Signed)
[name] [phone] [name] called back to inquire about results of UA, she said that her mom is going to restroom more often, not much output and it has an smell.

## 2020-05-09 NOTE — Telephone Encounter (Signed)
She is going to call her dad to see if he can come pick up a specimen.

## 2020-05-19 ENCOUNTER — Encounter: Payer: Self-pay | Admitting: Cardiovascular Disease

## 2020-05-19 NOTE — Progress Notes (Signed)
Cardiology Office Note   Date:  05/19/2020   ID:  [name], DOB 08-Oct-1934, MRN [id number]  PCP:  Melissa Showers, MD  Cardiologist:  Previous Melissa Coco MD , now [name]   Chief Complaint  Patient presents with  . Atrial Fibrillation   Problem List 1. Paroxysmal atrial fibrillation 2. Hyperlipidemia 3. 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 Hypothyroidism-      [name] is a 85 y.o. female who presents for a six-month follow-up visit  This 85 year old woman is seen for a scheduled followup office visit. She has a history of intermittent high-grade AV block and has a functioning St. Jude dual-chamber pacemaker. She is followed for this by [name]. She has a history of hypercholesterolemia. She has a history of osteoarthritis. She has also had some memory issues and is followed by [name]. Since last visit she has had no new cardiac symptoms. She has not had any dizziness or syncope. No chest pain. She has had a past history of unintentional weight loss but since her last visit she has had no change in weight. The patient had a chest x-ray on 02/19/14 which showed no abnormality. She complains of feeling tired and worn out. She has had extensive lab work by her PCP which did not show any evidence of malignancy. She had a CT scan of the abdomen and pelvis on 08/08/14 which did not show any abdominal aortic aneurysm or other cause of weight loss. She complains of dyspnea and not being able to get a complete breath. Impression The patient developed a fractured right hip. Her husband and she notes that she never fell. Her hip just started to hurt. X-rays confirmed a fracture and [name] operated on her on 01/03/15. She is walking with a cane. Her recent pacemaker interrogation revealed that she was having episodes of paroxysmal atrial fibrillation 1% of the time.  This does put her at increased risk for thromboembolic disease.  January 07, 2016:  Was seen with her husband today . She has some dementia and husband answered most of the detailed questions .   Was started on Eliquis for PAF by [name] at her last visit Tolerating it well.  Sees [name] for hypothyroidism   [name], [name] She has a history of paroxysmal atrial fib . She has dementia and most of the questions were answered by her husband. Has a slight cough No bleeding with the Eliquis   [name], [name], [name])  for follow-up of her paroxysmal atrial for ablation. Is a history of dementia and most of her questions were answered by her husband.  She has a pacemaker. Had some stomach issues.     May 16, 2019:  [name] is seen today for a follow-up visit. She has a history of atrial fibrillation and also history of complete heart block.  She has a pacemaker.  She has severe dementia and most of the questions were answered by her husband. She is on Eliquis.  She has not had any episodes of bleeding. Has lost   Nov. 29, 2021: [name] is seen today for follow up  Seen with her husband, [name] Severe dementia  [place] in July,  Likely was dehydrated ,  Went to rehab unit Now has a full time caregiver ,  She has underlying history of atrial relation.  She has a pacer. I discussed having her see me on an as-needed basis but this would depend on whether or not [name] feels comfortable refilling her scripts  She is on eliquis and HCTZ  She has routine labs with [name]    Past Medical History:  Diagnosis Date  . A-fib ([place])   . Allergy   . Arthritis   . Asthma   . Colon polyps   . Complete heart block -intermittent    a. 11/2012 s/p SJM Accent DR RF dc ppm.  . Depression   . Environmental allergies   . Hyperlipidemia   . Memory retention disorder 03/22/2013  . Osteoarthritis    hands  . Syncope 12/2019  . Thyroid disease     Past Surgical History:   Procedure Laterality Date  . APPENDECTOMY  1965  . CERVICAL POLYPECTOMY  1996  . COLONOSCOPY  2007   adenomatous polyps  . EYE SURGERY    . HAND SURGERY    . HIP ARTHROPLASTY Right 01/03/2015   Procedure: ARTHROPLASTY BIPOLAR HIP (HEMIARTHROPLASTY);  Surgeon: Melissa Nakayama, MD;  Location: [facility];  Service: Orthopedics;  Laterality: Right;  . INSERT / REPLACE / REMOVE PACEMAKER  12/02/2012    Dr [name]  . KNEE SURGERY    . ovarian cystectomy  1965  . PERMANENT PACEMAKER INSERTION N/A 12/02/2012   Procedure: PERMANENT PACEMAKER INSERTION;  Surgeon: Melissa Lance, MD;  [facility] C[facility] LAB;  Service: Cardiovascular;  Laterality: N/A;  . SALPINGOOPHORECTOMY     right  . tumor removed  1985   abdomen     Current Outpatient Medications  Medication Sig Dispense Refill  . acetaminophen (TYLENOL) 325 MG tablet Take 650 mg by mouth every 6 (six) hours as needed (pain).    Marland Kitchen b complex vitamins tablet Take 1 tablet by mouth daily.    . Calcium Citrate 250 MG TABS Take 250 mg by mouth daily.    . Cholecalciferol (VITAMIN D3) 125 MCG (5000 UT) TABS Take 5,000 Units by mouth daily.    . clotrimazole-betamethasone (LOTRISONE) cream Apply 1 application topically 2 (two) times daily. 30 g 0  . docusate sodium (COLACE) 100 MG capsule Take 100 mg by mouth 2 (two) times daily.    Marland Kitchen ELIQUIS 2.5 MG TABS tablet TAKE 1 TABLET(2.5 MG) BY MOUTH TWICE DAILY 60 tablet 5  . hydrochlorothiazide (HYDRODIURIL) 25 MG tablet Take 1 tablet (25 mg total) by mouth daily. 90 tablet 0  . levothyroxine (SYNTHROID, LEVOTHROID) 75 MCG tablet Take 75 mcg by mouth daily before breakfast.     . LORazepam (ATIVAN) 0.5 MG tablet Take 1 tablet (0.5 mg total) by mouth 2 (two) times daily. 60 tablet 0  . Memantine HCl-Donepezil HCl (NAMZARIC) 28-10 MG CP24 Take 1 capsule by mouth at bedtime. 90 capsule 3  . nitrofurantoin, macrocrystal-monohydrate, (MACROBID) 100 MG capsule Take 1 capsule (100 mg total) by mouth 2 (two) times  daily. 10 capsule 0   No current facility-administered medications for this visit.    Allergies:   Sulfa antibiotics and Sulfasalazine    Social History:  The patient  reports that she has never smoked. She has never used smokeless tobacco. She reports current alcohol use. She reports that she does not use drugs.   Family History:  The patient's family history includes Colon cancer in her father; Colon polyps in her brother; Coronary artery disease in her brother; Diabetes in her brother.    ROS:  Please see the  history of present illness.   Otherwise, review of systems are positive for none.   All other systems are reviewed and negative.     Physical Exam: There were no vitals taken for this visit.  GEN:  Elderly female,  NAD  HEENT: Normal NECK: No JVD; No carotid bruits LYMPHATICS: No lymphadenopathy CARDIAC: RRR , no murmurs, rubs, gallops RESPIRATORY:  Clear to auscultation without rales, wheezing or rhonchi  ABDOMEN: Soft, non-tender, non-distended MUSCULOSKELETAL:  No edema; No deformity  SKIN: Warm and dry NEUROLOGIC:  Significant dementia ,  Would not cooperate with getting ECG    EKG:       Recent Labs: 03/18/2020: ALT 8; BUN 30; Creat 0.99; Hemoglobin 14.3; Platelets 263; Potassium 4.4; Sodium 140; TSH 3.47    Lipid Panel    Component Value Date/Time   CHOL 247 (H) 03/18/2020 1133   TRIG 120 03/18/2020 1133   HDL 69 03/18/2020 1133   CHOLHDL 3.6 03/18/2020 1133   VLDL 20 02/04/2017 0937   LDLCALC 154 (H) 03/18/2020 1133   LDLDIRECT 127.6 04/21/2013 1127      Wt Readings from Last 3 Encounters:  04/18/20 129 lb (58.5 kg)  03/19/20 134 lb (60.8 kg)  02/19/20 138 lb (62.6 kg)       ASSESSMENT AND PLAN:  1. Functioning St. Jude dual-chamber pacemaker follow-up with [name]  2.  Atrial fibrillation: She is on Eliquis 2.5 mg twice a day.  Continue   3. Hypercholesterolemia:   Followed by [name]   4. Hypothyroidism :  Followed by Dr.  Renold Osborn   5.  Dementia: She has severe dementia.  She was not really able to cooperate with getting an EKG today. As I discussed with her family before, I do not anticipate doing any additional cardiac studies.  At this point I think we need to concentrate on safety and comfort. We were not able to get an EKG today. She will continue to send her remote pacer checks.    Current medicines are reviewed at length with the patient today.  The patient does not have concerns regarding medicines.     Mertie Moores, MD  05/19/2020 7:33 PM    [address],  [address] [place], [place]  [address] Pager [phone] Phone: [phone]; Fax: [phone]

## 2020-05-20 ENCOUNTER — Encounter: Payer: Self-pay | Admitting: Cardiovascular Disease

## 2020-05-20 ENCOUNTER — Other Ambulatory Visit: Payer: Self-pay

## 2020-05-20 ENCOUNTER — Ambulatory Visit: Payer: Medicare PPO | Admitting: Cardiovascular Disease

## 2020-05-20 VITALS — BP 102/62 | HR 88 | Ht 69.0 in | Wt 130.6 lb

## 2020-05-20 DIAGNOSIS — I480 Paroxysmal atrial fibrillation: Secondary | ICD-10-CM | POA: Diagnosis not present

## 2020-05-20 NOTE — Patient Instructions (Signed)
Medication Instructions:  Your physician recommends that you continue on your current medications as directed. Please refer to the Current Medication list given to you today.  *If you need a refill on your cardiac medications before your next appointment, please call your pharmacy*   Lab Work: None If you have labs (blood work) drawn today and your tests are completely normal, you will receive your results only by: Marland Kitchen MyChart Message (if you have MyChart) OR . A paper copy in the mail If you have any lab test that is abnormal or we need to change your treatment, we will call you to review the results.   Testing/Procedures: None   Follow-Up: At [facility], you and your health needs are our priority.  As part of our continuing mission to provide you with exceptional heart care, we have created designated Provider Care Teams.  These Care Teams include your primary Cardiologist (physician) and Advanced Practice Providers (APPs -  Physician Assistants and Nurse Practitioners) who all work together to provide you with the care you need, when you need it.  We recommend signing up for the patient portal called "MyChart".  Sign up information is provided on this After Visit Summary.  MyChart is used to connect with patients for Virtual Visits (Telemedicine).  Patients are able to view lab/test results, encounter notes, upcoming appointments, etc.  Non-urgent messages can be sent to your provider as well.   To learn more about what you can do with MyChart, go to [email or web address].    Your next appointment:   As needed  The format for your next appointment:   In Person  Provider:   You may see Mertie Moores, MD or one of the following Advanced Practice Providers on your designated Care Team:    Richardson Dopp, PA-C  Robbie Lis, [place]    Other Instructions

## 2020-05-21 NOTE — Progress Notes (Signed)
Totally agree with you. I think [name] is having a hard time. They are considering [place].  Hope you and Jackelyn Poling are doing well. Many thanks, MJB

## 2020-05-22 ENCOUNTER — Ambulatory Visit (INDEPENDENT_AMBULATORY_CARE_PROVIDER_SITE_OTHER): Payer: Medicare PPO

## 2020-05-22 DIAGNOSIS — I442 Atrioventricular block, complete: Secondary | ICD-10-CM | POA: Diagnosis not present

## 2020-05-22 LAB — CUP PACEART REMOTE DEVICE CHECK
Battery Remaining Longevity: 74 mo
Battery Remaining Percentage: 73 %
Battery Voltage: 2.92 V
Brady Statistic AP VP Percent: 22 %
Brady Statistic AP VS Percent: 3.1 %
Brady Statistic AS VP Percent: 38 %
Brady Statistic AS VS Percent: 34 %
Brady Statistic RA Percent Paced: 24 %
Brady Statistic RV Percent Paced: 60 %
Date Time Interrogation Session: 20211201014749
Implantable Lead Implant Date: 20140613
Implantable Lead Implant Date: 20140613
Implantable Lead Location: 753859
Implantable Lead Location: 753860
Implantable Pulse Generator Implant Date: 20140613
Lead Channel Impedance Value: 310 Ohm
Lead Channel Impedance Value: 360 Ohm
Lead Channel Pacing Threshold Amplitude: 0.5 V
Lead Channel Pacing Threshold Amplitude: 1.25 V
Lead Channel Pacing Threshold Pulse Width: 0.5 ms
Lead Channel Pacing Threshold Pulse Width: 0.5 ms
Lead Channel Sensing Intrinsic Amplitude: 12 mV
Lead Channel Sensing Intrinsic Amplitude: 3.3 mV
Lead Channel Setting Pacing Amplitude: 2 V
Lead Channel Setting Pacing Amplitude: 2.5 V
Lead Channel Setting Pacing Pulse Width: 0.5 ms
Lead Channel Setting Sensing Sensitivity: 5 mV
Pulse Gen Model: 2210
Pulse Gen Serial Number: 7474456

## 2020-05-29 NOTE — Progress Notes (Signed)
Remote pacemaker transmission.   

## 2020-06-04 ENCOUNTER — Other Ambulatory Visit: Payer: Self-pay | Admitting: Internal Medicine

## 2020-06-11 ENCOUNTER — Other Ambulatory Visit: Payer: Self-pay

## 2020-06-11 ENCOUNTER — Ambulatory Visit: Payer: Medicare PPO | Admitting: Adult Health

## 2020-06-11 VITALS — BP 105/63 | HR 74 | Wt 130.0 lb

## 2020-06-11 DIAGNOSIS — F0280 Dementia in other diseases classified elsewhere without behavioral disturbance: Secondary | ICD-10-CM

## 2020-06-11 DIAGNOSIS — G309 Alzheimer's disease, unspecified: Secondary | ICD-10-CM

## 2020-06-11 NOTE — Progress Notes (Signed)
PATIENT: [name] DOB: [birth year]  REASON FOR VISIT: follow up HISTORY FROM: patient  HISTORY OF PRESENT ILLNESS: Today 06/11/20:  [name] is an 85 year old female with a history of Alzheimer's disease.  She returns today for follow-up.  She continues to live at home with her husband.  She has a daughter that also checks on her frequently.  She has a caregiver there throughout the day.  She requires assistance with all ADLs.  Her daughter provides all of her meals.  Husband manages her medications and appointments.  No significant changes in mood or behavior.  Caregiver feels that she does have some hallucinations although they are not fearful or balance.  She returns today for an evaluation.  12/12/19:[name] is an 85 year old female with a history of Alzheimer's disease.  She returns today for follow-up.  She remains on Namzaric.  She lives at home with her husband but her daughter checks on her twice a week.  Daughter states that she helps her with bathing and brushing her teeth.  She also cooks for them.  Her husband is able to manage the medications.  Daughter reports that she sleeps a lot during the day but sleeps all night as well.  She reports in regards to her mood daughter reports that 90% of the time she is laughing and approximately 10% of time she may be agitated.  She returns today for an evaluation.  HISTORY 06/08/19:  [name] is an 85 year old female with a history of Alzheimer's disease.  She returns today for follow-up.  She is currently on Namzaric.  She lives at home with her husband.  He feels that her memory has remained the same.  She is able to complete most ADLs independently.  He reports that her daughter does come over and helps her bathe.  She is able to feed herself.  She no longer cooks.  She no longer operates a motor vehicle.  Denies any trouble sleeping.  She returns today for follow-up.   REVIEW OF SYSTEMS: Out of a complete 14 system review of  symptoms, the patient complains only of the following symptoms, and all other reviewed systems are negative.  See HPI  ALLERGIES: Allergies  Allergen Reactions  . Sulfa Antibiotics Diarrhea and Rash  . Sulfasalazine Diarrhea and Rash    HOME MEDICATIONS: Outpatient Medications Prior to Visit  Medication Sig Dispense Refill  . acetaminophen (TYLENOL) 325 MG tablet Take 650 mg by mouth every 6 (six) hours as needed (pain).    Marland Kitchen b complex vitamins tablet Take 1 tablet by mouth daily.    . Calcium Citrate 250 MG TABS Take 250 mg by mouth daily.    . Cholecalciferol (VITAMIN D3) 125 MCG (5000 UT) TABS Take 5,000 Units by mouth daily.    . clotrimazole-betamethasone (LOTRISONE) cream Apply 1 application topically 2 (two) times daily. 30 g 0  . docusate sodium (COLACE) 100 MG capsule Take 100 mg by mouth 2 (two) times daily.    Marland Kitchen ELIQUIS 2.5 MG TABS tablet TAKE 1 TABLET(2.5 MG) BY MOUTH TWICE DAILY 60 tablet 5  . hydrochlorothiazide (HYDRODIURIL) 25 MG tablet Take 1 tablet (25 mg total) by mouth daily. 90 tablet 0  . levothyroxine (SYNTHROID, LEVOTHROID) 75 MCG tablet Take 75 mcg by mouth daily before breakfast.     . LORazepam (ATIVAN) 0.5 MG tablet TAKE 1 TABLET(0.5 MG) BY MOUTH TWICE DAILY AS NEEDED FOR ANXIETY 60 tablet 0  . Memantine HCl-Donepezil HCl (NAMZARIC) 28-10  MG CP24 Take 1 capsule by mouth at bedtime. 90 capsule 3   No facility-administered medications prior to visit.    PAST MEDICAL HISTORY: Past Medical History:  Diagnosis Date  . A-fib ([place])   . Allergy   . Arthritis   . Asthma   . Colon polyps   . Complete heart block -intermittent    a. 11/2012 s/p SJM Accent DR RF dc ppm.  . Depression   . Environmental allergies   . Hyperlipidemia   . Memory retention disorder 03/22/2013  . Osteoarthritis    hands  . Syncope 12/2019  . Thyroid disease     PAST SURGICAL HISTORY: Past Surgical History:  Procedure Laterality Date  . APPENDECTOMY  1965  . CERVICAL  POLYPECTOMY  1996  . COLONOSCOPY  2007   adenomatous polyps  . EYE SURGERY    . HAND SURGERY    . HIP ARTHROPLASTY Right 01/03/2015   Procedure: ARTHROPLASTY BIPOLAR HIP (HEMIARTHROPLASTY);  Surgeon: [name], MD;  Location: [facility];  Service: Orthopedics;  Laterality: Right;  . INSERT / REPLACE / REMOVE PACEMAKER  12/02/2012    Dr Lovena Le  . KNEE SURGERY    . ovarian cystectomy  1965  . PERMANENT PACEMAKER INSERTION N/A 12/02/2012   Procedure: PERMANENT PACEMAKER INSERTION;  Surgeon: Evans Lance, MD;  Location: [facility] C[facility] LAB;  Service: Cardiovascular;  Laterality: N/A;  . SALPINGOOPHORECTOMY     right  . tumor removed  1985   abdomen    FAMILY HISTORY: Family History  Problem Relation Age of Onset  . Colon cancer Father   . Colon polyps Brother   . Diabetes Brother        maternal aunt  . Coronary artery disease Brother     SOCIAL HISTORY: Social History   Socioeconomic History  . Marital status: Married    Spouse name: [name]  . Number of children: 3  . Years of education: College  . Highest education level: Not on file  Occupational History  . Occupation: elem. school teacher    Comment: retired  Tobacco Use  . Smoking status: Never Smoker  . Smokeless tobacco: Never Used  Vaping Use  . Vaping Use: Never used  Substance and Sexual Activity  . Alcohol use: Yes    Alcohol/week: 0.0 standard drinks    Comment: occas. which is seldom  . Drug use: No  . Sexual activity: Not Currently  Other Topics Concern  . Not on file  Social History Narrative   Patient has one daughter, two adopted children(son,daughter).   Patient lives at home with spouse [name]).   Patient is retired.   Patient has a college education.   Patient is right-handed.   Patient drinks very little caffeine.   Social Determinants of Health   Financial Resource Strain: Not on file  Food Insecurity: Not on file  Transportation Needs: Not on file  Physical Activity: Not on file   Stress: Not on file  Social Connections: Not on file  Intimate Partner Violence: Not on file      PHYSICAL EXAM  Vitals:   06/11/20 1400  BP: 105/63  Pulse: 74  Weight: 130 lb (59 kg)   Body mass index is 19.2 kg/m.   MMSE - Mini Mental State Exam 06/11/2020 06/08/2019 09/20/2017  Not completed: Unable to complete Unable to complete -  Orientation to time - - 1  Orientation to Place - - 0  Registration - - 3  Attention/ Calculation - -  5  Recall - - 0  Language- name 2 objects - - 2  Language- repeat - - 0  Language- follow 3 step command - - 3  Language- read & follow direction - - 1  Write a sentence - - 1  Copy design - - 1  Total score - - 17     Generalized: Well developed, in no acute distress   Neurological examination  Mentation: Alert oriented to time, place, history taking. Follows all commands speech and language fluent Cranial nerve II-XII: Pupils were equal round reactive to light. Extraocular movements were full, visual field were full on confrontational test.  Head turning and shoulder shrug  were normal and symmetric. Motor: The motor testing reveals 5 over 5 strength of all 4 extremities. Good symmetric motor tone is noted throughout.  Sensory: Sensory testing is intact to soft touch on all 4 extremities. No evidence of extinction is noted.  Coordination: Cerebellar testing reveals good finger-nose-finger and heel-to-shin bilaterally.  Gait and station: Gait is normal.  Reflexes: Deep tendon reflexes are symmetric and normal bilaterally.   DIAGNOSTIC DATA (LABS, IMAGING, TESTING) - I reviewed patient records, labs, notes, testing and imaging myself where available.  Lab Results  Component Value Date   WBC 6.8 03/18/2020   HGB 14.3 03/18/2020   HCT 43.3 03/18/2020   MCV 93.7 03/18/2020   PLT 263 03/18/2020      Component Value Date/Time   NA 140 03/18/2020 1133   NA 131 (L) 10/21/2017 1014   K 4.4 03/18/2020 1133   CL 104 03/18/2020 1133    CO2 27 03/18/2020 1133   GLUCOSE 82 03/18/2020 1133   BUN 30 (H) 03/18/2020 1133   BUN 18 10/21/2017 1014   CREATININE 0.99 (H) 03/18/2020 1133   CALCIUM 9.3 03/18/2020 1133   PROT 6.6 03/18/2020 1133   ALBUMIN 3.7 01/14/2020 1449   AST 17 03/18/2020 1133   ALT 8 03/18/2020 1133   ALKPHOS 52 01/14/2020 1449   BILITOT 0.4 03/18/2020 1133   GFRNONAA 53 (L) 03/18/2020 1133   GFRAA 61 03/18/2020 1133   Lab Results  Component Value Date   CHOL 247 (H) 03/18/2020   HDL 69 03/18/2020   LDLCALC 154 (H) 03/18/2020   LDLDIRECT 127.6 04/21/2013   TRIG 120 03/18/2020   CHOLHDL 3.6 03/18/2020   Lab Results  Component Value Date   HGBA1C 5.9 (H) 03/05/2014   Lab Results  Component Value Date   VITAMINB12 1,048 07/13/2017   Lab Results  Component Value Date   TSH 3.47 03/18/2020      ASSESSMENT AND PLAN 85 y.o. year old female  has a past medical history of A-fib ([place]), Allergy, Arthritis, Asthma, Colon polyps, Complete heart block -intermittent, Depression, Environmental allergies, Hyperlipidemia, Memory retention disorder (03/22/2013), Osteoarthritis, Syncope (12/2019), and Thyroid disease. here with:  Memory disturbance    Continue Namzaric 28-10 mg Daily   Unable to complete MMSE  Advised if symptoms worsen or she develops new symptoms  Follow-up in 6 months or sooner if needed   I spent 20 minutes of face-to-face and non-face-to-face time with patient.  This included previsit chart review, lab review, study review, order entry, electronic health record documentation, patient education.  Ward Givens, MSN, NP-C 06/11/2020, 1:59 PM [facility] [address] [place], [place] [address] [phone]

## 2020-06-11 NOTE — Patient Instructions (Signed)
Your Plan:  Continue namzaric-- up to family if they want to continue this- questionable if its offering any benefit at this time Follow-up with podiatrist for foot pain If your symptoms worsen or you develop new symptoms please let us know.    Thank you for coming to see [place] at [facility]. I hope we have been able to provide you high quality care today.  You may receive a patient satisfaction survey over the next few weeks. We would appreciate your feedback and comments so that we may continue to improve ourselves and the health of our patients.

## 2020-06-23 DIAGNOSIS — Z03818 Encounter for observation for suspected exposure to other biological agents ruled out: Secondary | ICD-10-CM | POA: Diagnosis not present

## 2020-06-23 DIAGNOSIS — H6503 Acute serous otitis media, bilateral: Secondary | ICD-10-CM | POA: Diagnosis not present

## 2020-06-23 DIAGNOSIS — Z20822 Contact with and (suspected) exposure to covid-19: Secondary | ICD-10-CM | POA: Diagnosis not present

## 2020-07-16 ENCOUNTER — Other Ambulatory Visit: Payer: Self-pay | Admitting: Internal Medicine

## 2020-07-17 DIAGNOSIS — M79605 Pain in left leg: Secondary | ICD-10-CM | POA: Diagnosis not present

## 2020-07-24 ENCOUNTER — Other Ambulatory Visit: Payer: Self-pay | Admitting: Internal Medicine

## 2020-07-26 ENCOUNTER — Ambulatory Visit: Payer: Medicare PPO | Admitting: Podiatry

## 2020-08-09 ENCOUNTER — Telehealth: Payer: Self-pay | Admitting: Internal Medicine

## 2020-08-09 NOTE — Telephone Encounter (Signed)
[name] [phone]  [name] called inquiring about some Physical Therapy for [name] to teach her how to use a [employer] and a [employer]. He stated she will not use them and he is hoping that some physical therapy to teach her how would encourage her to use them. I told him to check with his insurance to see what company they would cover to come out to the home. He is going to check on that and call back.

## 2020-08-13 ENCOUNTER — Telehealth: Payer: Self-pay | Admitting: Internal Medicine

## 2020-08-13 ENCOUNTER — Other Ambulatory Visit: Payer: Self-pay

## 2020-08-13 ENCOUNTER — Encounter: Payer: Self-pay | Admitting: Internal Medicine

## 2020-08-13 ENCOUNTER — Ambulatory Visit: Payer: Medicare PPO | Admitting: Internal Medicine

## 2020-08-13 VITALS — BP 100/70 | HR 80 | Temp 97.9°F | Ht 69.0 in

## 2020-08-13 DIAGNOSIS — R5383 Other fatigue: Secondary | ICD-10-CM | POA: Diagnosis not present

## 2020-08-13 DIAGNOSIS — E039 Hypothyroidism, unspecified: Secondary | ICD-10-CM

## 2020-08-13 DIAGNOSIS — Z8679 Personal history of other diseases of the circulatory system: Secondary | ICD-10-CM | POA: Diagnosis not present

## 2020-08-13 DIAGNOSIS — Z7901 Long term (current) use of anticoagulants: Secondary | ICD-10-CM | POA: Diagnosis not present

## 2020-08-13 DIAGNOSIS — R413 Other amnesia: Secondary | ICD-10-CM | POA: Diagnosis not present

## 2020-08-13 DIAGNOSIS — Z950 Presence of cardiac pacemaker: Secondary | ICD-10-CM

## 2020-08-13 LAB — COMPLETE METABOLIC PANEL WITH GFR
AG Ratio: 1.2 (calc) (ref 1.0–2.5)
ALT: 16 U/L (ref 6–29)
AST: 26 U/L (ref 10–35)
Albumin: 3.7 g/dL (ref 3.6–5.1)
Alkaline phosphatase (APISO): 68 U/L (ref 37–153)
BUN/Creatinine Ratio: 34 (calc) — ABNORMAL HIGH (ref 6–22)
BUN: 42 mg/dL — ABNORMAL HIGH (ref 7–25)
CO2: 28 mmol/L (ref 20–32)
Calcium: 9.7 mg/dL (ref 8.6–10.4)
Chloride: 99 mmol/L (ref 98–110)
Creat: 1.25 mg/dL — ABNORMAL HIGH (ref 0.60–0.88)
GFR, Est African American: 46 mL/min/{1.73_m2} — ABNORMAL LOW (ref >=60)
GFR, Est Non African American: 40 mL/min/{1.73_m2} — ABNORMAL LOW (ref >=60)
Globulin: 3 g/dL (calc) (ref 1.9–3.7)
Glucose, Bld: 94 mg/dL (ref 65–99)
Potassium: 3.2 mmol/L — ABNORMAL LOW (ref 3.5–5.3)
Sodium: 140 mmol/L (ref 135–146)
Total Bilirubin: 0.5 mg/dL (ref 0.2–1.2)
Total Protein: 6.7 g/dL (ref 6.1–8.1)

## 2020-08-13 LAB — CBC WITH DIFFERENTIAL/PLATELET
Absolute Monocytes: 1267 cells/uL — ABNORMAL HIGH (ref 200–950)
Basophils Absolute: 51 cells/uL (ref 0–200)
Basophils Relative: 0.4 %
Eosinophils Absolute: 115 cells/uL (ref 15–500)
Eosinophils Relative: 0.9 %
HCT: 41.5 % (ref 35.0–45.0)
Hemoglobin: 14.5 g/dL (ref 11.7–15.5)
Lymphs Abs: 1549 cells/uL (ref 850–3900)
MCH: 32.4 pg (ref 27.0–33.0)
MCHC: 34.9 g/dL (ref 32.0–36.0)
MCV: 92.6 fL (ref 80.0–100.0)
MPV: 9.5 fL (ref 7.5–12.5)
Monocytes Relative: 9.9 %
Neutro Abs: 9818 cells/uL — ABNORMAL HIGH (ref 1500–7800)
Neutrophils Relative %: 76.7 %
Platelets: 326 10*3/uL (ref 140–400)
RBC: 4.48 10*6/uL (ref 3.80–5.10)
RDW: 11.8 % (ref 11.0–15.0)
Total Lymphocyte: 12.1 %
WBC: 12.8 10*3/uL — ABNORMAL HIGH (ref 3.8–10.8)

## 2020-08-13 LAB — TSH: TSH: 0.93 mIU/L (ref 0.40–4.50)

## 2020-08-13 NOTE — Progress Notes (Signed)
   Subjective:    Patient ID: [name], female    DOB: 30-Jul-1934, 86 y.o.   MRN: [id number]  HPI 86 year old Female with history of memory loss brought in today by her husband and caretaker after her daughter called saying that they were concerned about patient having volume depletion.  According to her husband, she ate breakfast this morning.  Has consumed some fluids but daughter noticed that her mouth was dry that her lips were chapped and became concerned.  Apparently has urinated at least once today.  Patient has severe memory loss and requires considerable care.  Today she really cannot communicate at all.  She is alert.  She is somewhat cooperative.  I cannot get her to open her mouth but her lips are a bit dry.    Review of Systems husband denies that she has had nausea vomiting or diarrhea.  No fever or chills that he is aware of.     Objective:   Physical Exam Blood pressure 100/70 pulse 80 temperature 97.9 degrees pulse oximetry 97% BMI 19.20  Patient seen in wheelchair in no acute distress.  Respiratory rate is normal.  Skin is warm and dry.  She does not speak.  Tongue is slightly dry.  Neck is supple.  Chest clear to auscultation.  Cardiac exam: Regular rate and rhythm, distant heart sounds.       Assessment & Plan:  Severe memory loss-is followed by neurology and is on Namzaric  Possible volume depletion-apparent decreased fluid intake today and lips were dry.  Tongue is slightly dry.  History of paroxysmal atrial fibrillation.  History of anticoagulation with Eliquis.  High degree AV block status post pacemaker placement  She is on HCTZ for mild dependent edema.  This was started in September 2021.  Hypothyroidism-treated with thyroid replacement medication  Plan: It is difficult to assess her volume status.  We drew today a CBC with differential, c-Met and TSH.  Husband was given urine specimen cup and he will try to bring a urine specimen back tomorrow.   Husband will monitor symptoms and let me know if the condition worsens.

## 2020-08-13 NOTE — Telephone Encounter (Signed)
Spoke with Levada Dy, she had already left, however the aid and Mr Friberg is going to bring her in as soon as they can get her here. I explained, they must come on immediately.

## 2020-08-13 NOTE — Patient Instructions (Signed)
Labs drawn today and pending.  Please bring urine specimen back to the office tomorrow to have it checked.  Continue to monitor patient and her symptoms.  No change in medications at this time.

## 2020-08-13 NOTE — Telephone Encounter (Signed)
Can she come today.

## 2020-08-13 NOTE — Telephone Encounter (Signed)
Tedd Sias [phone]  Levada Dy called to say she is concerned about her mom being dehydrated, she would like for her to be seen. She stated that her 02 stats 92-94 and she is lethargic, lips are chapped and sticky, she barely walks at a shuffle, this has been going on for a week to 10 days. Levada Dy does not want her to get as bad as she did last time where she passes out and has to go to hospital.

## 2020-08-13 NOTE — Telephone Encounter (Signed)
scheduled

## 2020-08-14 ENCOUNTER — Other Ambulatory Visit (INDEPENDENT_AMBULATORY_CARE_PROVIDER_SITE_OTHER): Payer: Medicare PPO

## 2020-08-14 DIAGNOSIS — R5383 Other fatigue: Secondary | ICD-10-CM | POA: Diagnosis not present

## 2020-08-14 LAB — POCT URINALYSIS DIPSTICK
Bilirubin, UA: NEGATIVE
Glucose, UA: NEGATIVE
Ketones, UA: NEGATIVE
Nitrite, UA: NEGATIVE
Protein, UA: NEGATIVE
Spec Grav, UA: 1.025 (ref 1.010–1.025)
Urobilinogen, UA: 0.2 E.U./dL
pH, UA: 5 (ref 5.0–8.0)

## 2020-08-15 ENCOUNTER — Other Ambulatory Visit: Payer: Self-pay

## 2020-08-15 MED ORDER — NITROFURANTOIN MONOHYD MACRO 100 MG PO CAPS: 100.0000 mg | ORAL_CAPSULE | Freq: Two times a day (BID) | ORAL | 0 refills | Status: DC

## 2020-08-16 ENCOUNTER — Encounter: Payer: Self-pay | Admitting: Internal Medicine

## 2020-08-16 ENCOUNTER — Other Ambulatory Visit: Payer: Self-pay

## 2020-08-16 ENCOUNTER — Telehealth: Payer: Self-pay | Admitting: Internal Medicine

## 2020-08-16 ENCOUNTER — Ambulatory Visit: Payer: Medicare PPO | Admitting: Internal Medicine

## 2020-08-16 VITALS — BP 100/60 | HR 88 | Temp 98.9°F | Ht 69.0 in

## 2020-08-16 DIAGNOSIS — B9689 Other specified bacterial agents as the cause of diseases classified elsewhere: Secondary | ICD-10-CM

## 2020-08-16 DIAGNOSIS — R413 Other amnesia: Secondary | ICD-10-CM

## 2020-08-16 DIAGNOSIS — E869 Volume depletion, unspecified: Secondary | ICD-10-CM

## 2020-08-16 DIAGNOSIS — R7989 Other specified abnormal findings of blood chemistry: Secondary | ICD-10-CM | POA: Diagnosis not present

## 2020-08-16 DIAGNOSIS — N390 Urinary tract infection, site not specified: Secondary | ICD-10-CM | POA: Diagnosis not present

## 2020-08-16 LAB — URINE CULTURE
MICRO NUMBER:: 11569766
SPECIMEN QUALITY:: ADEQUATE

## 2020-08-16 LAB — URINALYSIS, MICROSCOPIC ONLY
Hyaline Cast: NONE SEEN /LPF
WBC, UA: >=60 /HPF — AB (ref 0–5)

## 2020-08-16 MED ORDER — CIPROFLOXACIN HCL 250 MG PO TABS: 250.0000 mg | ORAL_TABLET | Freq: Two times a day (BID) | ORAL | 0 refills | Status: AC

## 2020-08-16 NOTE — Addendum Note (Signed)
Addended by: Mady Haagensen on: 08/16/2020 04:42 PM   Modules accepted: Orders

## 2020-08-16 NOTE — Patient Instructions (Addendum)
Urine culture has grown Klebsiella pneumoniae sensitive to Cipro and patient will be started on Cipro 250 mg twice daily for 7 days.  In 1 week husband will need to come get a urine specimen cup for repeat UA.  Macrobid discontinued today.  Basic metabolic panel checked today due to elevated creatinine at most recent visit likely due to volume depletion.  Husband reports she is staying well-hydrated at the present time over the past few days.  He reports her appetite is good.

## 2020-08-16 NOTE — Telephone Encounter (Signed)
[facility] Referral, Demographics, order for PT and nursing to [facility] [phone], phone [phone]

## 2020-08-16 NOTE — Progress Notes (Signed)
   Subjective:    Patient ID: [name], female    DOB: [birth year], 86 y.o.   MRN: [id number]  HPI 86 year old Female seen today for follow-up from  visit on February 22 for lethargy and possible dehydration.  She does not cooperate with orthostatic blood pressures.  She seemed less talkative than usual.  She was thought to possibly have UTI. Had abnormal urine specimen when seen here on February 22 and was subsequently started on Macrobid empirically on February 23 when specimen was brought back to the office.  Was also thought to be mildly volume depleted.  C-Met was drawn and her creatinine was 1.25 and had been 0.99 when last checked September 2021.  Her TSH was normal.  Culture is still pending. She had E. coli UTI sensitive to [place] in November 2021 and was treated with Macrobid. No follow up U/A was done at that time. She improved clinically.  Patient's daughter called Feb 22 saying patient's lips were dry, and she was worried about patient being  dehydrated..Patient was seen in office accompanied by her husband. She seemed lethargic. We drew CBC with differential, c-Met, TSH.  White blood cell count was elevated at 12,800.  Advised patient's husband to come get urine specimen cup and submit urine specimen. Urine specimen had large LE 3+ and specific gravity was 1.025.  TSH was normal.  Creatinine was elevated at 1.25 and previously had been 0.99 when checked September 2021.  White blood cell count was elevated 12,800.  Hemoglobin was 14.5 g and generally ranges from 12.7 to 15 g.  Culture was ordered.    At the time of visit today, culture result was not back.  She and her husband were advised to continue Macrobid until urine culture was resulted.  Addendum: Urine culture was subsequently resulted to [place] at 12:57 PM today.  Culture grew Klebsiella pneumoniae sensitive to Cipro.  She will be started on Cipro 250 mg twice daily for 7 days.  Macrobid will be discontinued.  Husband will need to  submit new urine specimen in sterile container in 7 days.     Review of Systems no new complaints.  Husband reports appetite is okay.  They are also accompanied by their caretaker who helps with transportation issues.  They have considered moving to [place] and it may be that will be necessary in the near future.  Her husband looks a bit tired but he still sharp mentally.     Objective:   Physical Exam  No CVA tenderness Blood pressure 100/60 pulse 88, temperature 98.9 Patient unable to stand.  She is seen in a wheelchair.  Unable to weigh her today.    Assessment & Plan:  Acute UTI with Klebsiella pneumoniae-change Macrobid to Cipro 250 mg twice daily for 7 days after which she will need repeat urine specimen.  Dementia-not improving and slowly getting worse  Elevated serum creatinine at last visit consistent with volume depletion.  Husband reports she has been drinking plenty of water.  Basic metabolic panel repeated today.  Elevated white blood cell count-this was repeated today along with basic metabolic panel due to elevated creatinine at last visit

## 2020-08-17 LAB — CBC WITH DIFFERENTIAL/PLATELET
Absolute Monocytes: 1094 cells/uL — ABNORMAL HIGH (ref 200–950)
Basophils Absolute: 27 cells/uL (ref 0–200)
Basophils Relative: 0.2 %
Eosinophils Absolute: 324 cells/uL (ref 15–500)
Eosinophils Relative: 2.4 %
HCT: 41.5 % (ref 35.0–45.0)
Hemoglobin: 14 g/dL (ref 11.7–15.5)
Lymphs Abs: 662 cells/uL — ABNORMAL LOW (ref 850–3900)
MCH: 31.1 pg (ref 27.0–33.0)
MCHC: 33.7 g/dL (ref 32.0–36.0)
MCV: 92.2 fL (ref 80.0–100.0)
MPV: 9.4 fL (ref 7.5–12.5)
Monocytes Relative: 8.1 %
Neutro Abs: 11394 cells/uL — ABNORMAL HIGH (ref 1500–7800)
Neutrophils Relative %: 84.4 %
Platelets: 259 10*3/uL (ref 140–400)
RBC: 4.5 10*6/uL (ref 3.80–5.10)
RDW: 11.8 % (ref 11.0–15.0)
Total Lymphocyte: 4.9 %
WBC: 13.5 10*3/uL — ABNORMAL HIGH (ref 3.8–10.8)

## 2020-08-17 LAB — BASIC METABOLIC PANEL
BUN/Creatinine Ratio: 24 (calc) — ABNORMAL HIGH (ref 6–22)
BUN: 26 mg/dL — ABNORMAL HIGH (ref 7–25)
CO2: 29 mmol/L (ref 20–32)
Calcium: 9.2 mg/dL (ref 8.6–10.4)
Chloride: 100 mmol/L (ref 98–110)
Creat: 1.1 mg/dL — ABNORMAL HIGH (ref 0.60–0.88)
Glucose, Bld: 156 mg/dL — ABNORMAL HIGH (ref 65–99)
Potassium: 3.3 mmol/L — ABNORMAL LOW (ref 3.5–5.3)
Sodium: 143 mmol/L (ref 135–146)

## 2020-08-19 NOTE — Telephone Encounter (Signed)
I called to let Mr Newsome know and he said that patient was really doing good today, she has good color, rosy checks, talking pretty good, since getting new medicine she is more alert and active, sitting up and more involved in what is going on, eating and drinking.  He said to hold off on [facility] for awhile, maybe give them time to get more staff, will let us know at later date if he wants to pursue.  He will also come by and pick up specimen cup to bring back on 08/26/2020

## 2020-08-19 NOTE — Telephone Encounter (Signed)
[facility] called back today to say they had received our referral and just can not take on anymore new patients at this time due to staffing.

## 2020-08-21 ENCOUNTER — Ambulatory Visit (INDEPENDENT_AMBULATORY_CARE_PROVIDER_SITE_OTHER): Payer: Medicare PPO

## 2020-08-21 DIAGNOSIS — I440 Atrioventricular block, first degree: Secondary | ICD-10-CM | POA: Diagnosis not present

## 2020-08-23 LAB — CUP PACEART REMOTE DEVICE CHECK
Battery Remaining Longevity: 68 mo
Battery Remaining Percentage: 65 %
Battery Voltage: 2.9 V
Brady Statistic AP VP Percent: 21 %
Brady Statistic AP VS Percent: 3.2 %
Brady Statistic AS VP Percent: 40 %
Brady Statistic AS VS Percent: 34 %
Brady Statistic RA Percent Paced: 23 %
Brady Statistic RV Percent Paced: 61 %
Date Time Interrogation Session: 20220302021925
Implantable Lead Implant Date: 20140613
Implantable Lead Implant Date: 20140613
Implantable Lead Location: 753859
Implantable Lead Location: 753860
Implantable Pulse Generator Implant Date: 20140613
Lead Channel Impedance Value: 310 Ohm
Lead Channel Impedance Value: 450 Ohm
Lead Channel Pacing Threshold Amplitude: 0.5 V
Lead Channel Pacing Threshold Amplitude: 1.25 V
Lead Channel Pacing Threshold Pulse Width: 0.5 ms
Lead Channel Pacing Threshold Pulse Width: 0.5 ms
Lead Channel Sensing Intrinsic Amplitude: 12 mV
Lead Channel Sensing Intrinsic Amplitude: 2.2 mV
Lead Channel Setting Pacing Amplitude: 2 V
Lead Channel Setting Pacing Amplitude: 2.5 V
Lead Channel Setting Pacing Pulse Width: 0.5 ms
Lead Channel Setting Sensing Sensitivity: 5 mV
Pulse Gen Model: 2210
Pulse Gen Serial Number: 7474456

## 2020-08-26 ENCOUNTER — Other Ambulatory Visit: Payer: Self-pay

## 2020-08-26 ENCOUNTER — Ambulatory Visit (INDEPENDENT_AMBULATORY_CARE_PROVIDER_SITE_OTHER): Payer: Medicare PPO | Admitting: Internal Medicine

## 2020-08-26 DIAGNOSIS — Z7901 Long term (current) use of anticoagulants: Secondary | ICD-10-CM | POA: Diagnosis not present

## 2020-08-26 DIAGNOSIS — G309 Alzheimer's disease, unspecified: Secondary | ICD-10-CM | POA: Diagnosis not present

## 2020-08-26 DIAGNOSIS — B9689 Other specified bacterial agents as the cause of diseases classified elsewhere: Secondary | ICD-10-CM

## 2020-08-26 DIAGNOSIS — M79605 Pain in left leg: Secondary | ICD-10-CM | POA: Diagnosis not present

## 2020-08-26 DIAGNOSIS — F0280 Dementia in other diseases classified elsewhere without behavioral disturbance: Secondary | ICD-10-CM | POA: Diagnosis not present

## 2020-08-26 DIAGNOSIS — N390 Urinary tract infection, site not specified: Secondary | ICD-10-CM

## 2020-08-26 DIAGNOSIS — I480 Paroxysmal atrial fibrillation: Secondary | ICD-10-CM | POA: Diagnosis not present

## 2020-08-26 DIAGNOSIS — M47816 Spondylosis without myelopathy or radiculopathy, lumbar region: Secondary | ICD-10-CM | POA: Diagnosis not present

## 2020-08-26 DIAGNOSIS — R32 Unspecified urinary incontinence: Secondary | ICD-10-CM | POA: Diagnosis not present

## 2020-08-26 DIAGNOSIS — G301 Alzheimer's disease with late onset: Secondary | ICD-10-CM

## 2020-08-26 DIAGNOSIS — I4430 Unspecified atrioventricular block: Secondary | ICD-10-CM | POA: Diagnosis not present

## 2020-08-26 DIAGNOSIS — M170 Bilateral primary osteoarthritis of knee: Secondary | ICD-10-CM | POA: Diagnosis not present

## 2020-08-26 LAB — POCT URINALYSIS DIPSTICK
Appearance: NEGATIVE
Bilirubin, UA: NEGATIVE
Blood, UA: NEGATIVE
Glucose, UA: NEGATIVE
Ketones, UA: NEGATIVE
Leukocytes, UA: NEGATIVE
Nitrite, UA: NEGATIVE
Odor: NEGATIVE
Protein, UA: NEGATIVE
Spec Grav, UA: 1.015 (ref 1.010–1.025)
Urobilinogen, UA: 0.2 E.U./dL
pH, UA: 6 (ref 5.0–8.0)

## 2020-08-27 ENCOUNTER — Telehealth: Payer: Self-pay | Admitting: Internal Medicine

## 2020-08-27 NOTE — Telephone Encounter (Signed)
Order written

## 2020-08-27 NOTE — Telephone Encounter (Signed)
[name] [phone]  [name] called to see if you would order a hospital bed for [name], he and PT feels like she would benefit from one. It would make things much easier for her.  [place]

## 2020-08-27 NOTE — Telephone Encounter (Signed)
Received Phone Call then a fax from [facility] about orders for patient having PT

## 2020-08-27 NOTE — Telephone Encounter (Signed)
Faxed Signed PT orders to [facility]   [phone], [phone]

## 2020-08-28 ENCOUNTER — Other Ambulatory Visit: Payer: Self-pay

## 2020-08-28 ENCOUNTER — Telehealth: Payer: Self-pay | Admitting: Internal Medicine

## 2020-08-28 ENCOUNTER — Ambulatory Visit: Payer: Medicare PPO | Admitting: Podiatry

## 2020-08-28 ENCOUNTER — Encounter: Payer: Self-pay | Admitting: Internal Medicine

## 2020-08-28 ENCOUNTER — Encounter: Payer: Self-pay | Admitting: Podiatry

## 2020-08-28 ENCOUNTER — Telehealth (INDEPENDENT_AMBULATORY_CARE_PROVIDER_SITE_OTHER): Payer: Medicare PPO | Admitting: Internal Medicine

## 2020-08-28 DIAGNOSIS — N390 Urinary tract infection, site not specified: Secondary | ICD-10-CM

## 2020-08-28 DIAGNOSIS — M79675 Pain in left toe(s): Secondary | ICD-10-CM | POA: Diagnosis not present

## 2020-08-28 DIAGNOSIS — L89620 Pressure ulcer of left heel, unstageable: Secondary | ICD-10-CM

## 2020-08-28 DIAGNOSIS — L89891 Pressure ulcer of other site, stage 1: Secondary | ICD-10-CM

## 2020-08-28 DIAGNOSIS — G301 Alzheimer's disease with late onset: Secondary | ICD-10-CM

## 2020-08-28 DIAGNOSIS — B351 Tinea unguium: Secondary | ICD-10-CM | POA: Diagnosis not present

## 2020-08-28 DIAGNOSIS — M79674 Pain in right toe(s): Secondary | ICD-10-CM

## 2020-08-28 DIAGNOSIS — B9689 Other specified bacterial agents as the cause of diseases classified elsewhere: Secondary | ICD-10-CM

## 2020-08-28 DIAGNOSIS — F0280 Dementia in other diseases classified elsewhere without behavioral disturbance: Secondary | ICD-10-CM

## 2020-08-28 DIAGNOSIS — R413 Other amnesia: Secondary | ICD-10-CM

## 2020-08-28 NOTE — Telephone Encounter (Signed)
Telephone call initially to daughter [name] [phone] who told me other daughterJill Alexanders [phone] [facility]) [phone] would be point person for contact about patient. Call to family was regarding request for hospital bed. [facility] who was giving PT apparently requested hospital bed but apparently an in-person visit is required to obtain one. She has severe dementia and a foot wound.  Melissa Alexanders is at her home in [place] and I am in my office.She confirms that [place]. [name] is her mother.   We have spent considerable time with this request and have spoken to son who was visiting from [place] who is a Marine scientist as well but he is going back to [place] today.It simply is not feasible to bring patient in to office. She is having difficulty ambulating. Apparently has developed a decubitus ulcer on foot. [name] tells me as does [name] that she has perked up since being treated for UTI with antibiotics. Went over labs from Feb 22 with [name]. [name] would like for urine to be re-cultured. We only did dipstick urine recently after treatment with antibiotics and no infection was found. However, urine had order was cloudy and orange in color.  I also then went on to discuss end-of life care. Asked about Hospice referral as patient seems to have declined over past few months. Discussed DO NOT RESSUCCITATE order.They do not have one currently. We can provide that for them if they desire. Discussed whether in home physician care would be better such as [facility] or Geriatric physician that makes house calls. She feels [facility] has this covered.They are getting more caregivers to help her father out. Told her he looked tired at patient's last visit. [name] will discuss Hospice referral with family but reminded her they had limits on the care they provide from physicians there.Their physicians do not always make regular house calls but rely on nursing staff.  [name] will be coming to visit her parents this  weekend.  Time spent speaking with daughter [name] and daughter Melissa Alexanders is 30 minutes.  We have agreed not to re-contact [facility] about hospital bed at this point. They will discuss with [facility] care givers.  MJB.MD

## 2020-08-28 NOTE — Telephone Encounter (Signed)
Called and spoke with [facility] and they need Demographics, Prescription and an office note explaining why patient needs a hospital bed.

## 2020-08-28 NOTE — Progress Notes (Signed)
Husband brought urine specimen in today. Says patient has improved. Patient not seen today. Urine has no evidence of infection but is yellow orange in color and has fecal smell. Not sure if good clean catch but urine dipstick here in office was negative and culture was not sent. Husband says patient much improved after treatment for Klebsiella Pneumoniae UTI on Feb 23 treated with low dose Cipro 250 mg twice daily x 7 days. MJB, MD

## 2020-08-28 NOTE — Telephone Encounter (Signed)
[facility] is requesting in- office visit in order to consider order for hospital bed at home. This is very frustrating as patient apparently has a foot ulcer and is having issues ambulating. It is not clear why patient needs hospital bed. I will call patient's daughter, Levada Dy.[facility] has been providing PT services at home. [facility] is providing in-home care. MJB,MD

## 2020-08-28 NOTE — Patient Instructions (Signed)
Urine specimen brought in by patient's husband shows no evidence of infection.

## 2020-08-29 DIAGNOSIS — I480 Paroxysmal atrial fibrillation: Secondary | ICD-10-CM | POA: Diagnosis not present

## 2020-08-29 DIAGNOSIS — M79605 Pain in left leg: Secondary | ICD-10-CM | POA: Diagnosis not present

## 2020-08-29 DIAGNOSIS — R32 Unspecified urinary incontinence: Secondary | ICD-10-CM | POA: Diagnosis not present

## 2020-08-29 DIAGNOSIS — G309 Alzheimer's disease, unspecified: Secondary | ICD-10-CM | POA: Diagnosis not present

## 2020-08-29 DIAGNOSIS — F0280 Dementia in other diseases classified elsewhere without behavioral disturbance: Secondary | ICD-10-CM | POA: Diagnosis not present

## 2020-08-29 DIAGNOSIS — M47816 Spondylosis without myelopathy or radiculopathy, lumbar region: Secondary | ICD-10-CM | POA: Diagnosis not present

## 2020-08-29 DIAGNOSIS — Z7901 Long term (current) use of anticoagulants: Secondary | ICD-10-CM | POA: Diagnosis not present

## 2020-08-29 DIAGNOSIS — I4430 Unspecified atrioventricular block: Secondary | ICD-10-CM | POA: Diagnosis not present

## 2020-08-29 DIAGNOSIS — M170 Bilateral primary osteoarthritis of knee: Secondary | ICD-10-CM | POA: Diagnosis not present

## 2020-08-29 NOTE — Progress Notes (Signed)
Remote pacemaker transmission.   

## 2020-09-01 NOTE — Progress Notes (Signed)
Subjective:  Patient ID: [name], female    DOB: 1935-04-28,  MRN: [id number]  [name] presents to clinic today for painful thick toenails that are difficult to trim. Pain interferes with ambulation. Aggravating factors include wearing enclosed shoe gear. Pain is relieved with periodic professional debridement..  She has h/o memory problems. Her husband and caregiver are both present during today's visit.   She has new spot on the back of her left heel. Area is not painful, but husband is concerned because it is dark. He is unsure how long it has been there. States patient sleeps on her back/  Review of Systems: Negative except as noted in the HPI. Past Medical History:  Diagnosis Date  . A-fib ([place])   . Allergy   . Arthritis   . Asthma   . Colon polyps   . Complete heart block -intermittent    a. 11/2012 s/p SJM Accent DR RF dc ppm.  . Depression   . Environmental allergies   . Hyperlipidemia   . Memory retention disorder 03/22/2013  . Osteoarthritis    hands  . Syncope 12/2019  . Thyroid disease    Past Surgical History:  Procedure Laterality Date  . APPENDECTOMY  1965  . CERVICAL POLYPECTOMY  1996  . COLONOSCOPY  2007   adenomatous polyps  . EYE SURGERY    . HAND SURGERY    . HIP ARTHROPLASTY Right 01/03/2015   Procedure: ARTHROPLASTY BIPOLAR HIP (HEMIARTHROPLASTY);  Surgeon: [name], MD;  Location: [facility];  Service: Orthopedics;  Laterality: Right;  . INSERT / REPLACE / REMOVE PACEMAKER  12/02/2012    Dr Lovena Le  . KNEE SURGERY    . ovarian cystectomy  1965  . PERMANENT PACEMAKER INSERTION N/A 12/02/2012   Procedure: PERMANENT PACEMAKER INSERTION;  Surgeon: Evans Lance, MD;  Location: [facility] CATH LAB;  Service: Cardiovascular;  Laterality: N/A;  . SALPINGOOPHORECTOMY     right  . tumor removed  1985   abdomen    Current Outpatient Medications:  .  acetaminophen (TYLENOL) 325 MG tablet, Take 650 mg by mouth every 6 (six) hours as needed (pain)., Disp: ,  Rfl:  .  b complex vitamins tablet, Take 1 tablet by mouth daily., Disp: , Rfl:  .  Calcium Citrate 250 MG TABS, Take 250 mg by mouth daily., Disp: , Rfl:  .  Cholecalciferol (VITAMIN D3) 125 MCG (5000 UT) TABS, Take 5,000 Units by mouth daily., Disp: , Rfl:  .  ciprofloxacin (CIPRO) 250 MG tablet, Take 1 tablet (250 mg total) by mouth 2 (two) times daily. DISCONTINUE MACROBID, Disp: 14 tablet, Rfl: 0 .  clotrimazole-betamethasone (LOTRISONE) cream, APPLY TOPICALLY TO THE AFFECTED AREA TWICE DAILY, Disp: 30 g, Rfl: 0 .  docusate sodium (COLACE) 100 MG capsule, Take 100 mg by mouth 2 (two) times daily., Disp: , Rfl:  .  ELIQUIS 2.5 MG TABS tablet, TAKE 1 TABLET(2.5 MG) BY MOUTH TWICE DAILY, Disp: 60 tablet, Rfl: 5 .  hydrochlorothiazide (HYDRODIURIL) 25 MG tablet, Take 1 tablet (25 mg total) by mouth daily., Disp: 90 tablet, Rfl: 0 .  levothyroxine (SYNTHROID, LEVOTHROID) 75 MCG tablet, Take 75 mcg by mouth daily before breakfast. , Disp: , Rfl:  .  LORazepam (ATIVAN) 0.5 MG tablet, TAKE 1 TABLET(0.5 MG) BY MOUTH TWICE DAILY AS NEEDED FOR ANXIETY, Disp: 60 tablet, Rfl: 3 .  Memantine HCl-Donepezil HCl (NAMZARIC) 28-10 MG CP24, Take 1 capsule by mouth at bedtime., Disp: 90 capsule, Rfl: 3 Allergies  Allergen Reactions  . Sulfa Antibiotics Diarrhea and Rash  . Sulfasalazine Diarrhea and Rash   Social History   Occupational History  . Occupation: elem. school teacher    Comment: retired  Tobacco Use  . Smoking status: Never Smoker  . Smokeless tobacco: Never Used  Vaping Use  . Vaping Use: Never used  Substance and Sexual Activity  . Alcohol use: Yes    Alcohol/week: 0.0 standard drinks    Comment: occas. which is seldom  . Drug use: No  . Sexual activity: Not Currently    Objective:   Constitutional [name] is a pleasant 86 y.o. Caucasian female, WD, WN in NAD.Marland Kitchen AAO x 3.   Vascular Dorsalis pedis pulses palpable bilaterally. Posterior tibial pulses palpable  bilaterally. Capillary refill normal to all digits.  No cyanosis or clubbing noted. Pedal hair growth absent b/l.  No pedal edema b/l.   Neurologic Normal speech. Protective sensation intact with 10 gram monofilament b/l. Epicritic sensation to light touch grossly present bilaterally.  Dermatologic Decubitus ulcer noted posterior aspect of left heel. Blister with dark heme noted. Centralized area of fluctuance. No surrounding erythema. No pain on palpation. Measures 2.0 x 2.5 cm and roof remains intact. No odor.No warmth. No abnormal coolness/ischemia. Pedal skin with normal turgor, texture and tone b/l.  Nails thick, elongated, dystrophic with pain on palpation x 10. No skin lesions.  Orthopedic: Normal joint ROM without pain or crepitus bilaterally. No visible deformities. No bony tenderness.   Radiographs: None Assessment:   1. Pain due to onychomycosis of toenails of both feet   2. Pressure injury of left heel, unstageable ([place])    Plan:  Patient was evaluated and treated and all questions answered.  Onychomycosis with pain -Nails palliatively debridement as below -Educated on self-care  Procedure: Nail Debridement Rationale: Pain Type of Debridement: manual, sharp debridement. Instrumentation: Nail nipper, rotary burr. Number of Nails: 10 -Examined patient. -Discussed decubitus ulcer with husband and caregiver. Dispensed heel pillow to be worn at all times when she is in bed. Husband/caregiver related understanding. Call office if condition worsens. -Toenails 1-5 b/l were debrided in length and girth with sterile nail nippers and dremel without iatrogenic bleeding.  -Continue to use compounded topical antifungal solution from [place] daily on toenails as patient permits. -Patient to report any pedal injuries to medical professional immediately. -Patient to continue soft, supportive shoe gear daily. -Patient/POA to call should there be question/concern in the  interim.  Return in about 2 weeks (around 09/11/2020) for with Dr March Rummage  pressure ulcer left heel.  Marzetta Board, DPM

## 2020-09-03 DIAGNOSIS — G309 Alzheimer's disease, unspecified: Secondary | ICD-10-CM | POA: Diagnosis not present

## 2020-09-03 DIAGNOSIS — R32 Unspecified urinary incontinence: Secondary | ICD-10-CM | POA: Diagnosis not present

## 2020-09-03 DIAGNOSIS — F0280 Dementia in other diseases classified elsewhere without behavioral disturbance: Secondary | ICD-10-CM | POA: Diagnosis not present

## 2020-09-03 DIAGNOSIS — M79605 Pain in left leg: Secondary | ICD-10-CM | POA: Diagnosis not present

## 2020-09-03 DIAGNOSIS — M47816 Spondylosis without myelopathy or radiculopathy, lumbar region: Secondary | ICD-10-CM | POA: Diagnosis not present

## 2020-09-03 DIAGNOSIS — I4430 Unspecified atrioventricular block: Secondary | ICD-10-CM | POA: Diagnosis not present

## 2020-09-03 DIAGNOSIS — Z7901 Long term (current) use of anticoagulants: Secondary | ICD-10-CM | POA: Diagnosis not present

## 2020-09-03 DIAGNOSIS — I480 Paroxysmal atrial fibrillation: Secondary | ICD-10-CM | POA: Diagnosis not present

## 2020-09-03 DIAGNOSIS — M170 Bilateral primary osteoarthritis of knee: Secondary | ICD-10-CM | POA: Diagnosis not present

## 2020-09-05 DIAGNOSIS — M79605 Pain in left leg: Secondary | ICD-10-CM | POA: Diagnosis not present

## 2020-09-05 DIAGNOSIS — M170 Bilateral primary osteoarthritis of knee: Secondary | ICD-10-CM | POA: Diagnosis not present

## 2020-09-05 DIAGNOSIS — F0280 Dementia in other diseases classified elsewhere without behavioral disturbance: Secondary | ICD-10-CM | POA: Diagnosis not present

## 2020-09-05 DIAGNOSIS — I480 Paroxysmal atrial fibrillation: Secondary | ICD-10-CM | POA: Diagnosis not present

## 2020-09-05 DIAGNOSIS — M47816 Spondylosis without myelopathy or radiculopathy, lumbar region: Secondary | ICD-10-CM | POA: Diagnosis not present

## 2020-09-05 DIAGNOSIS — I4430 Unspecified atrioventricular block: Secondary | ICD-10-CM | POA: Diagnosis not present

## 2020-09-05 DIAGNOSIS — Z7901 Long term (current) use of anticoagulants: Secondary | ICD-10-CM | POA: Diagnosis not present

## 2020-09-05 DIAGNOSIS — R32 Unspecified urinary incontinence: Secondary | ICD-10-CM | POA: Diagnosis not present

## 2020-09-05 DIAGNOSIS — G309 Alzheimer's disease, unspecified: Secondary | ICD-10-CM | POA: Diagnosis not present

## 2020-09-09 DIAGNOSIS — F0280 Dementia in other diseases classified elsewhere without behavioral disturbance: Secondary | ICD-10-CM | POA: Diagnosis not present

## 2020-09-09 DIAGNOSIS — G309 Alzheimer's disease, unspecified: Secondary | ICD-10-CM | POA: Diagnosis not present

## 2020-09-09 DIAGNOSIS — M79605 Pain in left leg: Secondary | ICD-10-CM | POA: Diagnosis not present

## 2020-09-09 DIAGNOSIS — Z7901 Long term (current) use of anticoagulants: Secondary | ICD-10-CM | POA: Diagnosis not present

## 2020-09-09 DIAGNOSIS — R32 Unspecified urinary incontinence: Secondary | ICD-10-CM | POA: Diagnosis not present

## 2020-09-09 DIAGNOSIS — M170 Bilateral primary osteoarthritis of knee: Secondary | ICD-10-CM | POA: Diagnosis not present

## 2020-09-09 DIAGNOSIS — I480 Paroxysmal atrial fibrillation: Secondary | ICD-10-CM | POA: Diagnosis not present

## 2020-09-09 DIAGNOSIS — M47816 Spondylosis without myelopathy or radiculopathy, lumbar region: Secondary | ICD-10-CM | POA: Diagnosis not present

## 2020-09-09 DIAGNOSIS — I4430 Unspecified atrioventricular block: Secondary | ICD-10-CM | POA: Diagnosis not present

## 2020-09-11 DIAGNOSIS — M47816 Spondylosis without myelopathy or radiculopathy, lumbar region: Secondary | ICD-10-CM | POA: Diagnosis not present

## 2020-09-11 DIAGNOSIS — I4430 Unspecified atrioventricular block: Secondary | ICD-10-CM | POA: Diagnosis not present

## 2020-09-11 DIAGNOSIS — M170 Bilateral primary osteoarthritis of knee: Secondary | ICD-10-CM | POA: Diagnosis not present

## 2020-09-11 DIAGNOSIS — F0280 Dementia in other diseases classified elsewhere without behavioral disturbance: Secondary | ICD-10-CM | POA: Diagnosis not present

## 2020-09-11 DIAGNOSIS — M79605 Pain in left leg: Secondary | ICD-10-CM | POA: Diagnosis not present

## 2020-09-11 DIAGNOSIS — G309 Alzheimer's disease, unspecified: Secondary | ICD-10-CM | POA: Diagnosis not present

## 2020-09-11 DIAGNOSIS — I480 Paroxysmal atrial fibrillation: Secondary | ICD-10-CM | POA: Diagnosis not present

## 2020-09-11 DIAGNOSIS — R32 Unspecified urinary incontinence: Secondary | ICD-10-CM | POA: Diagnosis not present

## 2020-09-11 DIAGNOSIS — Z7901 Long term (current) use of anticoagulants: Secondary | ICD-10-CM | POA: Diagnosis not present

## 2020-09-16 DIAGNOSIS — L89619 Pressure ulcer of right heel, unspecified stage: Secondary | ICD-10-CM | POA: Diagnosis not present

## 2020-09-17 ENCOUNTER — Ambulatory Visit: Payer: Medicare PPO | Admitting: Podiatry

## 2020-09-17 DIAGNOSIS — I480 Paroxysmal atrial fibrillation: Secondary | ICD-10-CM | POA: Diagnosis not present

## 2020-09-17 DIAGNOSIS — M170 Bilateral primary osteoarthritis of knee: Secondary | ICD-10-CM | POA: Diagnosis not present

## 2020-09-17 DIAGNOSIS — M79605 Pain in left leg: Secondary | ICD-10-CM | POA: Diagnosis not present

## 2020-09-17 DIAGNOSIS — M47816 Spondylosis without myelopathy or radiculopathy, lumbar region: Secondary | ICD-10-CM | POA: Diagnosis not present

## 2020-09-17 DIAGNOSIS — R32 Unspecified urinary incontinence: Secondary | ICD-10-CM | POA: Diagnosis not present

## 2020-09-17 DIAGNOSIS — I4430 Unspecified atrioventricular block: Secondary | ICD-10-CM | POA: Diagnosis not present

## 2020-09-17 DIAGNOSIS — G309 Alzheimer's disease, unspecified: Secondary | ICD-10-CM | POA: Diagnosis not present

## 2020-09-17 DIAGNOSIS — F0280 Dementia in other diseases classified elsewhere without behavioral disturbance: Secondary | ICD-10-CM | POA: Diagnosis not present

## 2020-09-17 DIAGNOSIS — Z7901 Long term (current) use of anticoagulants: Secondary | ICD-10-CM | POA: Diagnosis not present

## 2020-09-19 DIAGNOSIS — M79605 Pain in left leg: Secondary | ICD-10-CM | POA: Diagnosis not present

## 2020-09-19 DIAGNOSIS — Z7901 Long term (current) use of anticoagulants: Secondary | ICD-10-CM | POA: Diagnosis not present

## 2020-09-19 DIAGNOSIS — F0280 Dementia in other diseases classified elsewhere without behavioral disturbance: Secondary | ICD-10-CM | POA: Diagnosis not present

## 2020-09-19 DIAGNOSIS — R32 Unspecified urinary incontinence: Secondary | ICD-10-CM | POA: Diagnosis not present

## 2020-09-19 DIAGNOSIS — I4430 Unspecified atrioventricular block: Secondary | ICD-10-CM | POA: Diagnosis not present

## 2020-09-19 DIAGNOSIS — I480 Paroxysmal atrial fibrillation: Secondary | ICD-10-CM | POA: Diagnosis not present

## 2020-09-19 DIAGNOSIS — M170 Bilateral primary osteoarthritis of knee: Secondary | ICD-10-CM | POA: Diagnosis not present

## 2020-09-19 DIAGNOSIS — G309 Alzheimer's disease, unspecified: Secondary | ICD-10-CM | POA: Diagnosis not present

## 2020-09-19 DIAGNOSIS — M47816 Spondylosis without myelopathy or radiculopathy, lumbar region: Secondary | ICD-10-CM | POA: Diagnosis not present

## 2020-09-25 ENCOUNTER — Telehealth: Payer: Self-pay | Admitting: Internal Medicine

## 2020-09-25 ENCOUNTER — Emergency Department (HOSPITAL_BASED_OUTPATIENT_CLINIC_OR_DEPARTMENT_OTHER)
Admission: EM | Admit: 2020-09-25 | Discharge: 2020-09-25 | Disposition: A | Discharge status: Home / Self Care | Payer: Medicare PPO | Attending: Emergency Medicine | Admitting: Emergency Medicine

## 2020-09-25 ENCOUNTER — Other Ambulatory Visit (HOSPITAL_BASED_OUTPATIENT_CLINIC_OR_DEPARTMENT_OTHER): Payer: Self-pay

## 2020-09-25 ENCOUNTER — Encounter (HOSPITAL_BASED_OUTPATIENT_CLINIC_OR_DEPARTMENT_OTHER): Payer: Self-pay | Admitting: Emergency Medicine

## 2020-09-25 ENCOUNTER — Other Ambulatory Visit: Payer: Self-pay

## 2020-09-25 DIAGNOSIS — F0390 Unspecified dementia without behavioral disturbance: Secondary | ICD-10-CM | POA: Insufficient documentation

## 2020-09-25 DIAGNOSIS — Z950 Presence of cardiac pacemaker: Secondary | ICD-10-CM | POA: Diagnosis not present

## 2020-09-25 DIAGNOSIS — I4891 Unspecified atrial fibrillation: Secondary | ICD-10-CM | POA: Insufficient documentation

## 2020-09-25 DIAGNOSIS — Z96641 Presence of right artificial hip joint: Secondary | ICD-10-CM | POA: Diagnosis not present

## 2020-09-25 DIAGNOSIS — F0280 Dementia in other diseases classified elsewhere without behavioral disturbance: Secondary | ICD-10-CM | POA: Diagnosis not present

## 2020-09-25 DIAGNOSIS — E039 Hypothyroidism, unspecified: Secondary | ICD-10-CM | POA: Diagnosis not present

## 2020-09-25 DIAGNOSIS — Z7901 Long term (current) use of anticoagulants: Secondary | ICD-10-CM | POA: Diagnosis not present

## 2020-09-25 DIAGNOSIS — R32 Unspecified urinary incontinence: Secondary | ICD-10-CM | POA: Diagnosis not present

## 2020-09-25 DIAGNOSIS — J45909 Unspecified asthma, uncomplicated: Secondary | ICD-10-CM | POA: Diagnosis not present

## 2020-09-25 DIAGNOSIS — M79605 Pain in left leg: Secondary | ICD-10-CM | POA: Diagnosis not present

## 2020-09-25 DIAGNOSIS — L89151 Pressure ulcer of sacral region, stage 1: Secondary | ICD-10-CM | POA: Diagnosis not present

## 2020-09-25 DIAGNOSIS — Z79899 Other long term (current) drug therapy: Secondary | ICD-10-CM | POA: Insufficient documentation

## 2020-09-25 DIAGNOSIS — I4430 Unspecified atrioventricular block: Secondary | ICD-10-CM | POA: Diagnosis not present

## 2020-09-25 DIAGNOSIS — G309 Alzheimer's disease, unspecified: Secondary | ICD-10-CM | POA: Diagnosis not present

## 2020-09-25 DIAGNOSIS — M170 Bilateral primary osteoarthritis of knee: Secondary | ICD-10-CM | POA: Diagnosis not present

## 2020-09-25 DIAGNOSIS — M47816 Spondylosis without myelopathy or radiculopathy, lumbar region: Secondary | ICD-10-CM | POA: Diagnosis not present

## 2020-09-25 DIAGNOSIS — I480 Paroxysmal atrial fibrillation: Secondary | ICD-10-CM | POA: Diagnosis not present

## 2020-09-25 LAB — CBC WITH DIFFERENTIAL/PLATELET
Abs Immature Granulocytes: 0.03 10*3/uL (ref 0.00–0.07)
Basophils Absolute: 0.1 10*3/uL (ref 0.0–0.1)
Basophils Relative: 1 %
Eosinophils Absolute: 0.2 10*3/uL (ref 0.0–0.5)
Eosinophils Relative: 2 %
HCT: 38.2 % (ref 36.0–46.0)
Hemoglobin: 12.5 g/dL (ref 12.0–15.0)
Immature Granulocytes: 0 %
Lymphocytes Relative: 16 %
Lymphs Abs: 1.4 10*3/uL (ref 0.7–4.0)
MCH: 30.9 pg (ref 26.0–34.0)
MCHC: 32.7 g/dL (ref 30.0–36.0)
MCV: 94.6 fL (ref 80.0–100.0)
Monocytes Absolute: 1 10*3/uL (ref 0.1–1.0)
Monocytes Relative: 11 %
Neutro Abs: 6.4 10*3/uL (ref 1.7–7.7)
Neutrophils Relative %: 70 %
Platelets: 357 10*3/uL (ref 150–400)
RBC: 4.04 MIL/uL (ref 3.87–5.11)
RDW: 12.1 % (ref 11.5–15.5)
WBC: 9 10*3/uL (ref 4.0–10.5)
nRBC: 0 % (ref 0.0–0.2)

## 2020-09-25 LAB — COMPREHENSIVE METABOLIC PANEL
ALT: 14 U/L (ref 0–44)
AST: 17 U/L (ref 15–41)
Albumin: 3.3 g/dL — ABNORMAL LOW (ref 3.5–5.0)
Alkaline Phosphatase: 54 U/L (ref 38–126)
Anion gap: 8 (ref 5–15)
BUN: 18 mg/dL (ref 8–23)
CO2: 28 mmol/L (ref 22–32)
Calcium: 8.6 mg/dL — ABNORMAL LOW (ref 8.9–10.3)
Chloride: 103 mmol/L (ref 98–111)
Creatinine, Ser: 0.71 mg/dL (ref 0.44–1.00)
GFR, Estimated: >60 mL/min (ref >60)
Glucose, Bld: 101 mg/dL — ABNORMAL HIGH (ref 70–99)
Potassium: 3.9 mmol/L (ref 3.5–5.1)
Sodium: 139 mmol/L (ref 135–145)
Total Bilirubin: 0.6 mg/dL (ref 0.3–1.2)
Total Protein: 6.7 g/dL (ref 6.5–8.1)

## 2020-09-25 LAB — OCCULT BLOOD X 1 CARD TO LAB, STOOL: Fecal Occult Bld: NEGATIVE

## 2020-09-25 MED ORDER — BACITRACIN ZINC 500 UNIT/GM EX OINT: 1.0000 "application " | TOPICAL_OINTMENT | Freq: Two times a day (BID) | CUTANEOUS | 0 refills | Status: AC

## 2020-09-25 MED ORDER — BACITRACIN ZINC 500 UNIT/GM EX OINT
TOPICAL_OINTMENT | Freq: Once | CUTANEOUS | Status: AC
  Administered 2020-09-25: 1 via TOPICAL

## 2020-09-25 MED ORDER — BACITRACIN 500 UNIT/GM EX OINT
TOPICAL_OINTMENT | CUTANEOUS | 0 refills | Status: AC
  Filled 2020-09-25 (×2): qty 120, 14d supply, fill #0

## 2020-09-25 NOTE — Discharge Instructions (Addendum)
If you develop significant drainage, bleeding or pain to the wound, or any other new/concerning symptoms then return to the ER

## 2020-09-25 NOTE — ED Triage Notes (Signed)
Pt from home. Caregiver stated that Pt has skin breakdown that occurred over the weekend. Caregiver discovered today. Approx. 5 cm x 5 cm noted on left buttocks.

## 2020-09-25 NOTE — Telephone Encounter (Signed)
LVM to CB that Estel may need to go to wound care for follow up, we will need to put in referral.

## 2020-09-25 NOTE — ED Provider Notes (Signed)
[facility] EMERGENCY DEPT Provider Note   CSN: [id number] Arrival date & time: 09/25/20  1033  LEVEL 5 CAVEAT - DEMENTIA  History Chief Complaint  Patient presents with  . Skin Ulcer    [name] is a 86 y.o. female.  HPI 86 year old female presents with possible rectal bleeding.  History is from the husband given the patient's dementia.  The patient has caregivers who were undressing her for a bath and apparently became concerned that there was bleeding.  They could not tell exactly where it was from and its unspecified how much there was as the husband did not see it.  However he states he did not think it was much.  Otherwise, she is on Eliquis for A. fib.  This is the first time that has happened.  There is a sacral wound that was noted on exam and the husband states he thinks that is relatively new.   Past Medical History:  Diagnosis Date  . A-fib ([place])   . Allergy   . Arthritis   . Asthma   . Colon polyps   . Complete heart block -intermittent    a. 11/2012 s/p SJM Accent DR RF dc ppm.  . Depression   . Environmental allergies   . Hyperlipidemia   . Memory retention disorder 03/22/2013  . Osteoarthritis    hands  . Syncope 12/2019  . Thyroid disease     Patient Active Problem List   Diagnosis Date Noted  . Dependent edema 02/17/2020  . Primary osteoarthritis of first carpometacarpal joint of left hand 09/29/2016  . Alzheimer's disease ([place]) 03/19/2016  . Paroxysmal atrial fibrillation ([place]) 07/10/2015  . Atrial fibrillation ([place]) 04/29/2015  . Closed right hip fracture ([place]) 01/02/2015  . Weight loss, unintentional 02/19/2014  . Dementia arising in the senium and presenium ([place]) 01/30/2014  . 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 Memory loss 07/07/2013  . Memory retention disorder 03/22/2013  . Pacemaker 03/09/2013  . Complete heart block -intermittent   . Syncope 11/18/2012  . Personal  history of adenomatous colonic polyps 11/12/2011  . History of TIA (transient ischemic attack) 09/19/2011  . Hypothyroidism 01/13/2011  . Hyperlipidemia 01/13/2011  . First degree AV block 01/13/2011  . Gait disturbance 01/13/2011    Past Surgical History:  Procedure Laterality Date  . APPENDECTOMY  1965  . CERVICAL POLYPECTOMY  1996  . COLONOSCOPY  2007   adenomatous polyps  . EYE SURGERY    . HAND SURGERY    . HIP ARTHROPLASTY Right 01/03/2015   Procedure: ARTHROPLASTY BIPOLAR HIP (HEMIARTHROPLASTY);  Surgeon: [name], MD;  [facility];  Service: Orthopedics;  Laterality: Right;  . INSERT / REPLACE / REMOVE PACEMAKER  12/02/2012    Dr Lovena Le  . KNEE SURGERY    . ovarian cystectomy  1965  . PERMANENT PACEMAKER INSERTION N/A 12/02/2012   Procedure: PERMANENT PACEMAKER INSERTION;  Surgeon: Evans Lance, MD;  Location: [facility] CATH LAB;  Service: Cardiovascular;  Laterality: N/A;  . SALPINGOOPHORECTOMY     right  . tumor removed  1985   abdomen     OB History   No obstetric history on file.     Family History  Problem Relation Age of Onset  . Colon cancer Father   . Colon polyps Brother   . Diabetes Brother        maternal aunt  . Coronary artery disease Brother     Social History  Tobacco Use  . Smoking status: Never Smoker  . Smokeless tobacco: Never Used  Vaping Use  . Vaping Use: Never used  Substance Use Topics  . Alcohol use: Yes    Alcohol/week: 0.0 standard drinks    Comment: occas. which is seldom  . Drug use: No    Home Medications Prior to Admission medications   Medication Sig Start Date End Date Taking? Authorizing Provider  acetaminophen (TYLENOL) 325 MG tablet Take 650 mg by mouth every 6 (six) hours as needed (pain).   Yes [provider]  b complex vitamins tablet Take 1 tablet by mouth daily.   Yes [provider]  bacitracin ointment Apply 1 application topically 2 (two) times daily. 09/25/20  Yes Sherwood Gambler,  MD  Calcium Citrate 250 MG TABS Take 250 mg by mouth daily.   Yes [provider]  Cholecalciferol (VITAMIN D3) 125 MCG (5000 UT) TABS Take 5,000 Units by mouth daily.   Yes [provider]  docusate sodium (COLACE) 100 MG capsule Take 100 mg by mouth 2 (two) times daily.   Yes [provider]  ELIQUIS 2.5 MG TABS tablet TAKE 1 TABLET(2.5 MG) BY MOUTH TWICE DAILY 04/26/20  Yes Nahser, Wonda Cheng, MD  hydrochlorothiazide (HYDRODIURIL) 25 MG tablet Take 1 tablet (25 mg total) by mouth daily. 03/20/20  Yes Baxley, Cresenciano Lick, MD  levothyroxine (SYNTHROID, LEVOTHROID) 75 MCG tablet Take 75 mcg by mouth daily before breakfast.  12/13/14  Yes [provider]  LORazepam (ATIVAN) 0.5 MG tablet TAKE 1 TABLET(0.5 MG) BY MOUTH TWICE DAILY AS NEEDED FOR ANXIETY 07/16/20  Yes Baxley, Cresenciano Lick, MD  ciprofloxacin (CIPRO) 250 MG tablet Take 1 tablet (250 mg total) by mouth 2 (two) times daily. DISCONTINUE MACROBID 08/16/20   Elby Showers, MD  clotrimazole-betamethasone (LOTRISONE) cream APPLY TOPICALLY TO THE AFFECTED AREA TWICE DAILY 07/25/20   Elby Showers, MD  Memantine HCl-Donepezil HCl [facility]) 28-10 MG CP24 Take 1 capsule by mouth at bedtime. 12/12/19   Ward Givens, NP    Allergies    Sulfa antibiotics and Sulfasalazine  Review of Systems   Review of Systems  Unable to perform ROS: Dementia    Physical Exam Updated Vital Signs BP 101/70 (BP Location: Left Arm)   Pulse 89   Temp 98.9 F (37.2 C) (Oral)   Resp 14   Ht 5\' 9"  (1.753 m)   Wt 63.5 kg   SpO2 100%   BMI 20.67 kg/m   Physical Exam Vitals and nursing note reviewed. Exam conducted with a chaperone present.  Constitutional:      Appearance: She is well-developed.  HENT:     Head: Normocephalic and atraumatic.     Right Ear: External ear normal.     Left Ear: External ear normal.     Nose: Nose normal.  Eyes:     General:        Right eye: No discharge.        Left eye: No discharge.   Cardiovascular:     Rate and Rhythm: Normal rate and regular rhythm.     Heart sounds: Normal heart sounds.  Pulmonary:     Effort: Pulmonary effort is normal.     Breath sounds: Normal breath sounds.  Abdominal:     General: There is no distension.     Palpations: Abdomen is soft.     Tenderness: There is no abdominal tenderness.  Genitourinary:    Comments: Light brown stool on DRE.  No gross blood/melena. Skin:    General: Skin is warm and dry.       Neurological:     Mental Status: She is alert. She is disoriented.  Psychiatric:        Mood and Affect: Mood is not anxious.     ED Results / Procedures / Treatments   Labs (all labs ordered are listed, but only abnormal results are displayed) Labs Reviewed  COMPREHENSIVE METABOLIC PANEL - Abnormal; Notable for the following components:      Result Value   Glucose, Bld 101 (*)    Calcium 8.6 (*)    Albumin 3.3 (*)    All other components within normal limits  OCCULT BLOOD X 1 CARD TO LAB, STOOL  CBC WITH DIFFERENTIAL/PLATELET  POC OCCULT BLOOD, ED    EKG None  Radiology No results found.  Procedures Procedures   Medications Ordered in ED Medications  bacitracin ointment (1 application Topical Given 09/25/20 1218)    ED Course  I have reviewed the triage vital signs and the nursing notes.  Pertinent labs & imaging results that were available during my care of the patient were reviewed by me and considered in my medical decision making (see chart for details).    MDM Rules/Calculators/A&P                          On exam she has no indication of rectal bleeding.  However she does have a sacral pressure wound starting and there appears to be some early skin breakdown that does appear to have probably recently bled.  Hemoglobin is in the normal range.  "Slightly lower than her previous, I doubt this represents significant acute bleeding.  Vital signs are unremarkable.  Family is concerned is getting infected  which she does not overtly appear to be so right now but will put on some topical antibiotics and a dressing.  We discussed ways to help mitigate pressure ulcers at home.  Follow-up with PCP. Final Clinical Impression(s) / ED Diagnoses Final diagnoses:  Pressure injury of sacral region, stage 1    Rx / DC Orders ED Discharge Orders         Ordered    bacitracin ointment  2 times daily        09/25/20 1149           Sherwood Gambler, MD 09/25/20 1229

## 2020-09-26 ENCOUNTER — Other Ambulatory Visit (HOSPITAL_BASED_OUTPATIENT_CLINIC_OR_DEPARTMENT_OTHER): Payer: Self-pay

## 2020-09-27 ENCOUNTER — Telehealth: Payer: Self-pay

## 2020-09-27 ENCOUNTER — Other Ambulatory Visit: Payer: Self-pay

## 2020-09-27 ENCOUNTER — Ambulatory Visit: Payer: Medicare PPO | Admitting: Podiatry

## 2020-09-27 DIAGNOSIS — L89620 Pressure ulcer of left heel, unstageable: Secondary | ICD-10-CM

## 2020-09-27 NOTE — Telephone Encounter (Signed)
Orders for full thickness wound, left heel, faxed to [facility] along with demographics and office note faxed to [facility] including: Bordered foam daily Hydrogel dressing daily Gloves  Cotton tip applicators

## 2020-09-27 NOTE — Progress Notes (Signed)
  Subjective:  Patient ID: [name], female    DOB: Dec 23, 1934,  MRN: [id number]  Chief Complaint  Patient presents with  . Foot Ulcer     "black spot"(pressure ulcer) on bottom of left foot/heel *ref by galaway*    86 y.o. female presents for wound care. Hx confirmed with patient. Has been applying multipodus boot to relieve pressure. Objective:  Physical Exam: Wound Location: left heel Wound Measurement: 1.5x1 Wound Base: Necrotic eschar Peri-wound: Normal Exudate: None: wound tissue dry wound without warmth, erythema, signs of acute infection  Assessment:   1. Pressure ulcer, heel, left, unstageable ([place])      Plan:  Patient was evaluated and treated and all questions answered.  Ulcer left heel, decubitus  -Ulcer minimally debrided and crosshatched to promote autolysis -Order wound care supplies from [facility] -Neosporin and foam border dressing applied. Apply daily -F/u in 3 weeks for recheck.  Return in about 3 weeks (around 10/18/2020) for Wound Care.

## 2020-09-30 DIAGNOSIS — L8990 Pressure ulcer of unspecified site, unspecified stage: Secondary | ICD-10-CM | POA: Diagnosis not present

## 2020-09-30 DIAGNOSIS — I872 Venous insufficiency (chronic) (peripheral): Secondary | ICD-10-CM | POA: Diagnosis not present

## 2020-10-02 DIAGNOSIS — I480 Paroxysmal atrial fibrillation: Secondary | ICD-10-CM | POA: Diagnosis not present

## 2020-10-02 DIAGNOSIS — I4430 Unspecified atrioventricular block: Secondary | ICD-10-CM | POA: Diagnosis not present

## 2020-10-02 DIAGNOSIS — R32 Unspecified urinary incontinence: Secondary | ICD-10-CM | POA: Diagnosis not present

## 2020-10-02 DIAGNOSIS — M47816 Spondylosis without myelopathy or radiculopathy, lumbar region: Secondary | ICD-10-CM | POA: Diagnosis not present

## 2020-10-02 DIAGNOSIS — F0280 Dementia in other diseases classified elsewhere without behavioral disturbance: Secondary | ICD-10-CM | POA: Diagnosis not present

## 2020-10-02 DIAGNOSIS — Z7901 Long term (current) use of anticoagulants: Secondary | ICD-10-CM | POA: Diagnosis not present

## 2020-10-02 DIAGNOSIS — M79605 Pain in left leg: Secondary | ICD-10-CM | POA: Diagnosis not present

## 2020-10-02 DIAGNOSIS — G309 Alzheimer's disease, unspecified: Secondary | ICD-10-CM | POA: Diagnosis not present

## 2020-10-02 DIAGNOSIS — M170 Bilateral primary osteoarthritis of knee: Secondary | ICD-10-CM | POA: Diagnosis not present

## 2020-10-02 NOTE — Telephone Encounter (Signed)
Husband called to let us know they were putting cream on Mennie and she was doing good.

## 2020-10-07 DIAGNOSIS — Z7901 Long term (current) use of anticoagulants: Secondary | ICD-10-CM | POA: Diagnosis not present

## 2020-10-07 DIAGNOSIS — I4430 Unspecified atrioventricular block: Secondary | ICD-10-CM | POA: Diagnosis not present

## 2020-10-07 DIAGNOSIS — M79605 Pain in left leg: Secondary | ICD-10-CM | POA: Diagnosis not present

## 2020-10-07 DIAGNOSIS — M47816 Spondylosis without myelopathy or radiculopathy, lumbar region: Secondary | ICD-10-CM | POA: Diagnosis not present

## 2020-10-07 DIAGNOSIS — F0280 Dementia in other diseases classified elsewhere without behavioral disturbance: Secondary | ICD-10-CM | POA: Diagnosis not present

## 2020-10-07 DIAGNOSIS — G309 Alzheimer's disease, unspecified: Secondary | ICD-10-CM | POA: Diagnosis not present

## 2020-10-07 DIAGNOSIS — R32 Unspecified urinary incontinence: Secondary | ICD-10-CM | POA: Diagnosis not present

## 2020-10-07 DIAGNOSIS — M170 Bilateral primary osteoarthritis of knee: Secondary | ICD-10-CM | POA: Diagnosis not present

## 2020-10-07 DIAGNOSIS — I480 Paroxysmal atrial fibrillation: Secondary | ICD-10-CM | POA: Diagnosis not present

## 2020-10-08 DIAGNOSIS — R5381 Other malaise: Secondary | ICD-10-CM | POA: Diagnosis not present

## 2020-10-08 DIAGNOSIS — L89622 Pressure ulcer of left heel, stage 2: Secondary | ICD-10-CM | POA: Diagnosis not present

## 2020-10-08 DIAGNOSIS — Z7401 Bed confinement status: Secondary | ICD-10-CM | POA: Diagnosis not present

## 2020-10-08 DIAGNOSIS — S30810D Abrasion of lower back and pelvis, subsequent encounter: Secondary | ICD-10-CM | POA: Diagnosis not present

## 2020-10-08 DIAGNOSIS — S70222D Blister (nonthermal), left hip, subsequent encounter: Secondary | ICD-10-CM | POA: Diagnosis not present

## 2020-10-08 DIAGNOSIS — N390 Urinary tract infection, site not specified: Secondary | ICD-10-CM | POA: Diagnosis not present

## 2020-10-09 ENCOUNTER — Telehealth: Payer: Self-pay | Admitting: *Deleted

## 2020-10-09 ENCOUNTER — Telehealth: Payer: Self-pay | Admitting: Podiatry

## 2020-10-09 NOTE — Telephone Encounter (Signed)
Patients POA called in regarding treatment patient has been receiving, Patient came in 09/27/20 for treatment to heel, pressure wound. Patient is currently in early stages of infection but has not received any treatment since last visit. The POA states no one was given permission to treat patient and called to inquire about information that was given at the last visit. I read the notes to daughter from last visit since she is listed on the [facility]. I offered to schedule patient but patient had to be placed on [facility] schedule per Ruth. Stated she would not be able to come into office. Has been treated by another provider and the pressure wound was healing. The wound was scrapped and she was only suppose to come in for nail care, Please Advise   Levada Dy [phone]

## 2020-10-09 NOTE — Telephone Encounter (Signed)
Patient's daughter is calling with concerns about a precedure that was done in office recently and she was not aware(Medical POA)of. She is wanting to know what happened ,when is the f/u appointment.This may keep her from going into assistant living facility. Please call.  Returned call back to get more information, no answer, left Vmessage for more information.

## 2020-10-10 DIAGNOSIS — M47816 Spondylosis without myelopathy or radiculopathy, lumbar region: Secondary | ICD-10-CM | POA: Diagnosis not present

## 2020-10-10 DIAGNOSIS — I480 Paroxysmal atrial fibrillation: Secondary | ICD-10-CM | POA: Diagnosis not present

## 2020-10-10 DIAGNOSIS — I4430 Unspecified atrioventricular block: Secondary | ICD-10-CM | POA: Diagnosis not present

## 2020-10-10 DIAGNOSIS — F0280 Dementia in other diseases classified elsewhere without behavioral disturbance: Secondary | ICD-10-CM | POA: Diagnosis not present

## 2020-10-10 DIAGNOSIS — M170 Bilateral primary osteoarthritis of knee: Secondary | ICD-10-CM | POA: Diagnosis not present

## 2020-10-10 DIAGNOSIS — G309 Alzheimer's disease, unspecified: Secondary | ICD-10-CM | POA: Diagnosis not present

## 2020-10-10 DIAGNOSIS — Z7901 Long term (current) use of anticoagulants: Secondary | ICD-10-CM | POA: Diagnosis not present

## 2020-10-10 DIAGNOSIS — M79605 Pain in left leg: Secondary | ICD-10-CM | POA: Diagnosis not present

## 2020-10-10 DIAGNOSIS — R32 Unspecified urinary incontinence: Secondary | ICD-10-CM | POA: Diagnosis not present

## 2020-10-11 NOTE — Telephone Encounter (Signed)
After speaking with Guido Sander for this pt. She was upset and not made aware that the mother was coming back to be seen on 09/27/20 for treatment to the left heel. Pt was in the office a month ago for routine nail care with Dr. Elisha Ponder. At that appointment, the pts husband and caregiver was present, and I explained to Levada Dy that Dr. Elisha Ponder informed them both that the pt husband and caregiver that she would like for the pt to follow up with Dr. March Rummage in 2 wks for the left heel wound. Levada Dy made me aware that the pt has been seeing Dr. Rhona Raider at [facility] for years for this wound. The pts POA states that the wound was at its healing, now its a stage 3 and the pt cant go to the new memory care unit in [facility] until the wound is healed. Moving forward, I explain to the POA that if the husband is present he has given the consent to be seen. Please make sure the POA is informed and updated by provider or assist.

## 2020-10-15 DIAGNOSIS — Z7901 Long term (current) use of anticoagulants: Secondary | ICD-10-CM | POA: Diagnosis not present

## 2020-10-15 DIAGNOSIS — I10 Essential (primary) hypertension: Secondary | ICD-10-CM | POA: Diagnosis not present

## 2020-10-15 DIAGNOSIS — M79605 Pain in left leg: Secondary | ICD-10-CM | POA: Diagnosis not present

## 2020-10-15 DIAGNOSIS — L8990 Pressure ulcer of unspecified site, unspecified stage: Secondary | ICD-10-CM | POA: Diagnosis not present

## 2020-10-15 DIAGNOSIS — I4430 Unspecified atrioventricular block: Secondary | ICD-10-CM | POA: Diagnosis not present

## 2020-10-15 DIAGNOSIS — R32 Unspecified urinary incontinence: Secondary | ICD-10-CM | POA: Diagnosis not present

## 2020-10-15 DIAGNOSIS — I480 Paroxysmal atrial fibrillation: Secondary | ICD-10-CM | POA: Diagnosis not present

## 2020-10-15 DIAGNOSIS — M170 Bilateral primary osteoarthritis of knee: Secondary | ICD-10-CM | POA: Diagnosis not present

## 2020-10-15 DIAGNOSIS — E8889 Other specified metabolic disorders: Secondary | ICD-10-CM | POA: Diagnosis not present

## 2020-10-15 DIAGNOSIS — G309 Alzheimer's disease, unspecified: Secondary | ICD-10-CM | POA: Diagnosis not present

## 2020-10-15 DIAGNOSIS — E559 Vitamin D deficiency, unspecified: Secondary | ICD-10-CM | POA: Diagnosis not present

## 2020-10-15 DIAGNOSIS — M47816 Spondylosis without myelopathy or radiculopathy, lumbar region: Secondary | ICD-10-CM | POA: Diagnosis not present

## 2020-10-15 DIAGNOSIS — F0280 Dementia in other diseases classified elsewhere without behavioral disturbance: Secondary | ICD-10-CM | POA: Diagnosis not present

## 2020-10-18 DIAGNOSIS — G309 Alzheimer's disease, unspecified: Secondary | ICD-10-CM | POA: Diagnosis not present

## 2020-10-18 DIAGNOSIS — R32 Unspecified urinary incontinence: Secondary | ICD-10-CM | POA: Diagnosis not present

## 2020-10-18 DIAGNOSIS — F0280 Dementia in other diseases classified elsewhere without behavioral disturbance: Secondary | ICD-10-CM | POA: Diagnosis not present

## 2020-10-18 DIAGNOSIS — I480 Paroxysmal atrial fibrillation: Secondary | ICD-10-CM | POA: Diagnosis not present

## 2020-10-18 DIAGNOSIS — I4430 Unspecified atrioventricular block: Secondary | ICD-10-CM | POA: Diagnosis not present

## 2020-10-18 DIAGNOSIS — M79605 Pain in left leg: Secondary | ICD-10-CM | POA: Diagnosis not present

## 2020-10-18 DIAGNOSIS — M47816 Spondylosis without myelopathy or radiculopathy, lumbar region: Secondary | ICD-10-CM | POA: Diagnosis not present

## 2020-10-18 DIAGNOSIS — Z7901 Long term (current) use of anticoagulants: Secondary | ICD-10-CM | POA: Diagnosis not present

## 2020-10-18 DIAGNOSIS — M170 Bilateral primary osteoarthritis of knee: Secondary | ICD-10-CM | POA: Diagnosis not present

## 2020-10-21 DIAGNOSIS — F0280 Dementia in other diseases classified elsewhere without behavioral disturbance: Secondary | ICD-10-CM | POA: Diagnosis not present

## 2020-10-21 DIAGNOSIS — G309 Alzheimer's disease, unspecified: Secondary | ICD-10-CM | POA: Diagnosis not present

## 2020-10-21 DIAGNOSIS — M170 Bilateral primary osteoarthritis of knee: Secondary | ICD-10-CM | POA: Diagnosis not present

## 2020-10-21 DIAGNOSIS — Z7901 Long term (current) use of anticoagulants: Secondary | ICD-10-CM | POA: Diagnosis not present

## 2020-10-21 DIAGNOSIS — I480 Paroxysmal atrial fibrillation: Secondary | ICD-10-CM | POA: Diagnosis not present

## 2020-10-21 DIAGNOSIS — I4430 Unspecified atrioventricular block: Secondary | ICD-10-CM | POA: Diagnosis not present

## 2020-10-21 DIAGNOSIS — M47816 Spondylosis without myelopathy or radiculopathy, lumbar region: Secondary | ICD-10-CM | POA: Diagnosis not present

## 2020-10-21 DIAGNOSIS — M79605 Pain in left leg: Secondary | ICD-10-CM | POA: Diagnosis not present

## 2020-10-21 DIAGNOSIS — R32 Unspecified urinary incontinence: Secondary | ICD-10-CM | POA: Diagnosis not present

## 2020-10-22 DIAGNOSIS — E039 Hypothyroidism, unspecified: Secondary | ICD-10-CM | POA: Diagnosis not present

## 2020-10-22 DIAGNOSIS — N1831 Chronic kidney disease, stage 3a: Secondary | ICD-10-CM | POA: Diagnosis not present

## 2020-10-22 DIAGNOSIS — S70222D Blister (nonthermal), left hip, subsequent encounter: Secondary | ICD-10-CM | POA: Diagnosis not present

## 2020-10-22 DIAGNOSIS — S30810D Abrasion of lower back and pelvis, subsequent encounter: Secondary | ICD-10-CM | POA: Diagnosis not present

## 2020-10-22 DIAGNOSIS — L89622 Pressure ulcer of left heel, stage 2: Secondary | ICD-10-CM | POA: Diagnosis not present

## 2020-10-22 DIAGNOSIS — D72825 Bandemia: Secondary | ICD-10-CM | POA: Diagnosis not present

## 2020-10-22 DIAGNOSIS — Z7401 Bed confinement status: Secondary | ICD-10-CM | POA: Diagnosis not present

## 2020-10-26 ENCOUNTER — Other Ambulatory Visit: Payer: Self-pay | Admitting: Cardiovascular Disease

## 2020-10-28 NOTE — Telephone Encounter (Signed)
Pt's age 86, wt 63.5 kg, SCr 0.71, CrCl 60.18, last ov w/ PN 05/20/20.

## 2020-11-19 ENCOUNTER — Ambulatory Visit (INDEPENDENT_AMBULATORY_CARE_PROVIDER_SITE_OTHER): Payer: Medicare PPO | Admitting: Podiatry

## 2020-11-19 DIAGNOSIS — Z5329 Procedure and treatment not carried out because of patient's decision for other reasons: Secondary | ICD-10-CM

## 2020-11-19 NOTE — Progress Notes (Signed)
No show for appt. 

## 2020-11-20 ENCOUNTER — Ambulatory Visit (INDEPENDENT_AMBULATORY_CARE_PROVIDER_SITE_OTHER): Payer: Medicare PPO

## 2020-11-20 DIAGNOSIS — I442 Atrioventricular block, complete: Secondary | ICD-10-CM

## 2020-11-21 LAB — CUP PACEART REMOTE DEVICE CHECK
Battery Remaining Longevity: 67 mo
Battery Remaining Percentage: 65 %
Battery Voltage: 2.9 V
Brady Statistic AP VP Percent: 18 %
Brady Statistic AP VS Percent: 3.8 %
Brady Statistic AS VP Percent: 41 %
Brady Statistic AS VS Percent: 35 %
Brady Statistic RA Percent Paced: 20 %
Brady Statistic RV Percent Paced: 58 %
Date Time Interrogation Session: 20220601035212
Implantable Lead Implant Date: 20140613
Implantable Lead Implant Date: 20140613
Implantable Lead Location: 753859
Implantable Lead Location: 753860
Implantable Pulse Generator Implant Date: 20140613
Lead Channel Impedance Value: 280 Ohm
Lead Channel Impedance Value: 410 Ohm
Lead Channel Pacing Threshold Amplitude: 0.5 V
Lead Channel Pacing Threshold Amplitude: 1.25 V
Lead Channel Pacing Threshold Pulse Width: 0.5 ms
Lead Channel Pacing Threshold Pulse Width: 0.5 ms
Lead Channel Sensing Intrinsic Amplitude: 11.8 mV
Lead Channel Sensing Intrinsic Amplitude: 3.3 mV
Lead Channel Setting Pacing Amplitude: 2 V
Lead Channel Setting Pacing Amplitude: 2.5 V
Lead Channel Setting Pacing Pulse Width: 0.5 ms
Lead Channel Setting Sensing Sensitivity: 5 mV
Pulse Gen Model: 2210
Pulse Gen Serial Number: 7474456

## 2020-12-10 ENCOUNTER — Ambulatory Visit: Payer: Medicare PPO | Admitting: Podiatry

## 2020-12-13 NOTE — Progress Notes (Signed)
Remote pacemaker transmission.   

## 2020-12-20 DEATH — deceased

## 2021-04-21 ENCOUNTER — Other Ambulatory Visit (HOSPITAL_BASED_OUTPATIENT_CLINIC_OR_DEPARTMENT_OTHER): Payer: Self-pay

## 2021-06-11 ENCOUNTER — Ambulatory Visit: Payer: Medicare PPO | Admitting: Adult Health
